# Patient Record
Sex: Female | Born: 1960 | Race: White | Hispanic: No | Smoking: Never smoker
Health system: Southern US, Community
[De-identification: ages and names within clinical notes are randomized; demographics above are authoritative.]

## PROBLEM LIST (undated history)

## (undated) DIAGNOSIS — Z9889 Other specified postprocedural states: Secondary | ICD-10-CM

## (undated) DIAGNOSIS — R112 Nausea with vomiting, unspecified: Secondary | ICD-10-CM

## (undated) DIAGNOSIS — K219 Gastro-esophageal reflux disease without esophagitis: Secondary | ICD-10-CM

## (undated) DIAGNOSIS — E559 Vitamin D deficiency, unspecified: Secondary | ICD-10-CM

## (undated) DIAGNOSIS — M7060 Trochanteric bursitis, unspecified hip: Secondary | ICD-10-CM

## (undated) DIAGNOSIS — N6320 Unspecified lump in the left breast, unspecified quadrant: Secondary | ICD-10-CM

## (undated) DIAGNOSIS — F419 Anxiety disorder, unspecified: Secondary | ICD-10-CM

## (undated) DIAGNOSIS — K5790 Diverticulosis of intestine, part unspecified, without perforation or abscess without bleeding: Secondary | ICD-10-CM

## (undated) DIAGNOSIS — K760 Fatty (change of) liver, not elsewhere classified: Secondary | ICD-10-CM

## (undated) DIAGNOSIS — I10 Essential (primary) hypertension: Secondary | ICD-10-CM

## (undated) DIAGNOSIS — Z98811 Dental restoration status: Secondary | ICD-10-CM

## (undated) DIAGNOSIS — R002 Palpitations: Secondary | ICD-10-CM

## (undated) DIAGNOSIS — E669 Obesity, unspecified: Secondary | ICD-10-CM

## (undated) DIAGNOSIS — E785 Hyperlipidemia, unspecified: Secondary | ICD-10-CM

## (undated) DIAGNOSIS — T7840XA Allergy, unspecified, initial encounter: Secondary | ICD-10-CM

## (undated) DIAGNOSIS — R7303 Prediabetes: Secondary | ICD-10-CM

## (undated) HISTORY — DX: Gastro-esophageal reflux disease without esophagitis: K21.9

## (undated) HISTORY — DX: Anxiety disorder, unspecified: F41.9

## (undated) HISTORY — PX: TUBAL LIGATION: SHX77

## (undated) HISTORY — PX: BREAST SURGERY: SHX581

## (undated) HISTORY — DX: Allergy, unspecified, initial encounter: T78.40XA

## (undated) HISTORY — DX: Hyperlipidemia, unspecified: E78.5

## (undated) HISTORY — PX: JOINT REPLACEMENT: SHX530

## (undated) HISTORY — DX: Palpitations: R00.2

## (undated) HISTORY — DX: Diverticulosis of intestine, part unspecified, without perforation or abscess without bleeding: K57.90

## (undated) HISTORY — DX: Obesity, unspecified: E66.9

## (undated) HISTORY — DX: Vitamin D deficiency, unspecified: E55.9

## (undated) HISTORY — DX: Fatty (change of) liver, not elsewhere classified: K76.0

## (undated) HISTORY — PX: ABDOMINAL HYSTERECTOMY: SHX81

## (undated) HISTORY — DX: Trochanteric bursitis, unspecified hip: M70.60

## (undated) HISTORY — DX: Essential (primary) hypertension: I10

## (undated) SURGERY — Surgical Case
Anesthesia: *Unknown

---

## 2002-10-22 ENCOUNTER — Encounter: Payer: Self-pay | Admitting: Internal Medicine

## 2003-06-22 ENCOUNTER — Encounter: Payer: Self-pay | Admitting: Internal Medicine

## 2004-09-11 ENCOUNTER — Ambulatory Visit: Payer: Self-pay | Admitting: Internal Medicine

## 2004-11-20 ENCOUNTER — Ambulatory Visit: Payer: Self-pay | Admitting: Internal Medicine

## 2004-12-20 ENCOUNTER — Ambulatory Visit: Payer: Self-pay | Admitting: Internal Medicine

## 2005-03-02 ENCOUNTER — Ambulatory Visit: Payer: Self-pay | Admitting: Internal Medicine

## 2005-03-07 ENCOUNTER — Ambulatory Visit: Payer: Self-pay | Admitting: Internal Medicine

## 2005-10-09 ENCOUNTER — Ambulatory Visit: Payer: Self-pay | Admitting: Internal Medicine

## 2005-10-24 ENCOUNTER — Encounter: Payer: Self-pay | Admitting: Internal Medicine

## 2006-07-08 ENCOUNTER — Ambulatory Visit: Payer: Self-pay | Admitting: Internal Medicine

## 2006-07-12 ENCOUNTER — Ambulatory Visit: Payer: Self-pay | Admitting: Internal Medicine

## 2007-02-01 ENCOUNTER — Ambulatory Visit: Payer: Self-pay | Admitting: Internal Medicine

## 2007-12-04 ENCOUNTER — Ambulatory Visit: Payer: Self-pay | Admitting: Internal Medicine

## 2007-12-04 DIAGNOSIS — K219 Gastro-esophageal reflux disease without esophagitis: Secondary | ICD-10-CM | POA: Insufficient documentation

## 2007-12-04 DIAGNOSIS — N943 Premenstrual tension syndrome: Secondary | ICD-10-CM | POA: Insufficient documentation

## 2007-12-04 DIAGNOSIS — E785 Hyperlipidemia, unspecified: Secondary | ICD-10-CM | POA: Insufficient documentation

## 2007-12-04 DIAGNOSIS — R030 Elevated blood-pressure reading, without diagnosis of hypertension: Secondary | ICD-10-CM | POA: Insufficient documentation

## 2007-12-04 DIAGNOSIS — I10 Essential (primary) hypertension: Secondary | ICD-10-CM | POA: Insufficient documentation

## 2007-12-05 LAB — CONVERTED CEMR LAB
Albumin: 4.2 g/dL (ref 3.5–5.2)
BUN: 12 mg/dL (ref 6–23)
Basophils Absolute: 0 10*3/uL (ref 0.0–0.1)
Basophils Relative: 0 % (ref 0–1)
CO2: 27 meq/L (ref 19–32)
Chloride: 103 meq/L (ref 96–112)
Creatinine, Ser: 0.88 mg/dL (ref 0.40–1.20)
Eosinophils Absolute: 0.2 10*3/uL (ref 0.0–0.7)
Eosinophils Relative: 2 % (ref 0–5)
HCT: 43.5 % (ref 36.0–46.0)
MCV: 85.8 fL (ref 78.0–100.0)
Platelets: 330 10*3/uL (ref 150–400)
RDW: 14.3 % (ref 11.5–15.5)
TSH: 3.413 microintl units/mL (ref 0.350–5.50)
Total Bilirubin: 0.4 mg/dL (ref 0.3–1.2)

## 2008-01-14 ENCOUNTER — Encounter: Payer: Self-pay | Admitting: Internal Medicine

## 2009-05-05 ENCOUNTER — Ambulatory Visit: Payer: Self-pay | Admitting: Family Medicine

## 2009-05-10 LAB — CONVERTED CEMR LAB
Glucose, Bld: 95 mg/dL (ref 70–99)
HDL: 38 mg/dL — ABNORMAL LOW (ref >39)
Triglycerides: 136 mg/dL (ref <150)

## 2009-05-11 ENCOUNTER — Ambulatory Visit: Payer: Self-pay | Admitting: Family Medicine

## 2009-05-11 DIAGNOSIS — M76899 Other specified enthesopathies of unspecified lower limb, excluding foot: Secondary | ICD-10-CM | POA: Insufficient documentation

## 2009-05-13 ENCOUNTER — Ambulatory Visit: Payer: Self-pay | Admitting: Family Medicine

## 2009-05-13 ENCOUNTER — Encounter: Payer: Self-pay | Admitting: Internal Medicine

## 2009-05-13 LAB — HM MAMMOGRAPHY: HM Mammogram: NORMAL

## 2009-05-16 ENCOUNTER — Telehealth: Payer: Self-pay | Admitting: Internal Medicine

## 2009-05-26 ENCOUNTER — Encounter: Payer: Self-pay | Admitting: Internal Medicine

## 2009-06-15 ENCOUNTER — Telehealth: Payer: Self-pay | Admitting: Family Medicine

## 2009-06-22 ENCOUNTER — Ambulatory Visit: Payer: Self-pay | Admitting: Family Medicine

## 2009-08-11 ENCOUNTER — Ambulatory Visit: Payer: Self-pay | Admitting: Family Medicine

## 2009-08-13 LAB — CONVERTED CEMR LAB
Cholesterol: 212 mg/dL — ABNORMAL HIGH (ref 0–200)
Total CHOL/HDL Ratio: 4.9
Triglycerides: 151 mg/dL — ABNORMAL HIGH (ref <150)
VLDL: 30 mg/dL (ref 0–40)

## 2009-09-06 ENCOUNTER — Ambulatory Visit: Payer: Self-pay | Admitting: Family Medicine

## 2010-03-02 ENCOUNTER — Ambulatory Visit: Payer: Self-pay | Admitting: Family Medicine

## 2010-03-05 LAB — CONVERTED CEMR LAB
Alkaline Phosphatase: 89 units/L (ref 39–117)
BUN: 12 mg/dL (ref 6–23)
Creatinine, Ser: 0.84 mg/dL (ref 0.40–1.20)
Eosinophils Absolute: 0.1 10*3/uL (ref 0.0–0.7)
Eosinophils Relative: 2 % (ref 0–5)
Glucose, Bld: 103 mg/dL — ABNORMAL HIGH (ref 70–99)
HCT: 42.2 % (ref 36.0–46.0)
HDL: 40 mg/dL (ref >39)
LDL Cholesterol: 137 mg/dL — ABNORMAL HIGH (ref 0–99)
Lymphs Abs: 2.6 10*3/uL (ref 0.7–4.0)
MCV: 83.9 fL (ref 78.0–100.0)
Monocytes Absolute: 0.6 10*3/uL (ref 0.1–1.0)
Monocytes Relative: 7 % (ref 3–12)
Platelets: 293 10*3/uL (ref 150–400)
Sodium: 142 meq/L (ref 135–145)
Total Bilirubin: 0.5 mg/dL (ref 0.3–1.2)
Total CHOL/HDL Ratio: 5.1
Triglycerides: 136 mg/dL (ref <150)
VLDL: 27 mg/dL (ref 0–40)
WBC: 8.7 10*3/uL (ref 4.0–10.5)

## 2010-03-06 ENCOUNTER — Ambulatory Visit: Payer: Self-pay | Admitting: Family Medicine

## 2010-03-06 ENCOUNTER — Encounter (INDEPENDENT_AMBULATORY_CARE_PROVIDER_SITE_OTHER): Payer: Self-pay | Admitting: *Deleted

## 2010-03-06 DIAGNOSIS — N951 Menopausal and female climacteric states: Secondary | ICD-10-CM | POA: Insufficient documentation

## 2010-03-06 DIAGNOSIS — L293 Anogenital pruritus, unspecified: Secondary | ICD-10-CM | POA: Insufficient documentation

## 2010-03-07 ENCOUNTER — Encounter: Payer: Self-pay | Admitting: Family Medicine

## 2010-03-07 LAB — CONVERTED CEMR LAB: LH: 15.86 milliintl units/mL

## 2010-03-22 ENCOUNTER — Encounter (INDEPENDENT_AMBULATORY_CARE_PROVIDER_SITE_OTHER): Payer: Self-pay | Admitting: *Deleted

## 2010-03-27 ENCOUNTER — Ambulatory Visit: Payer: Self-pay | Admitting: Internal Medicine

## 2010-04-06 HISTORY — PX: COLONOSCOPY: SHX174

## 2010-04-13 ENCOUNTER — Ambulatory Visit: Payer: Self-pay | Admitting: Internal Medicine

## 2010-04-13 LAB — HM COLONOSCOPY

## 2010-05-16 ENCOUNTER — Telehealth: Payer: Self-pay | Admitting: Family Medicine

## 2010-06-23 ENCOUNTER — Ambulatory Visit: Payer: Self-pay | Admitting: Family Medicine

## 2010-06-23 ENCOUNTER — Encounter: Payer: Self-pay | Admitting: Family Medicine

## 2010-06-27 ENCOUNTER — Encounter (INDEPENDENT_AMBULATORY_CARE_PROVIDER_SITE_OTHER): Payer: Self-pay | Admitting: *Deleted

## 2010-09-05 NOTE — Letter (Signed)
Summary: [facility] Instructions  [facility]  [address] [place], [place] [address]   Phone: [phone]  Fax: [phone]       ALLESANDRA HUEBSCH    11-01-1960    MRN: [id number]        Procedure Day Dorna Bloom:  Lenor Coffin  04/13/10     Arrival Time:  12:30PM     Procedure Time:  1:30PM     Location of Procedure:                    _ X_  [facility] (4th Floor)                       PREPARATION FOR COLONOSCOPY WITH MOVIPREP   Starting 5 days prior to your procedure 04/08/10 do not eat nuts, seeds, popcorn, corn, beans, peas,  salads, or any raw vegetables.  Do not take any fiber supplements (e.g. Metamucil, Citrucel, and Benefiber).  THE DAY BEFORE YOUR PROCEDURE         DATE: 04/12/10  DAY: WEDNESDAY  1.  Drink clear liquids the entire day-NO SOLID FOOD  2.  Do not drink anything colored red or purple.  Avoid juices with pulp.  No orange juice.  3.  Drink at least 64 oz. (8 glasses) of fluid/clear liquids during the day to prevent dehydration and help the prep work efficiently.  CLEAR LIQUIDS INCLUDE: Water Jello Ice Popsicles Tea (sugar ok, no milk/cream) Powdered fruit flavored drinks Coffee (sugar ok, no milk/cream) Gatorade Juice: apple, white grape, white cranberry  Lemonade Clear bullion, consomm, broth Carbonated beverages (any kind) Strained chicken noodle soup Hard Candy                             4.  In the morning, mix first dose of MoviPrep solution:    Empty 1 Pouch A and 1 Pouch B into the disposable container    Add lukewarm drinking water to the top line of the container. Mix to dissolve    Refrigerate (mixed solution should be used within 24 hrs)  5.  Begin drinking the prep at 5:00 p.m. The MoviPrep container is divided by 4 marks.   Every 15 minutes drink the solution down to the next mark (approximately 8 oz) until the full liter is complete.   6.  Follow completed prep with 16 oz of clear liquid of your choice (Nothing  red or purple).  Continue to drink clear liquids until bedtime.  7.  Before going to bed, mix second dose of MoviPrep solution:    Empty 1 Pouch A and 1 Pouch B into the disposable container    Add lukewarm drinking water to the top line of the container. Mix to dissolve    Refrigerate  THE DAY OF YOUR PROCEDURE      DATE: 04/13/10  DAY: THURSDAY  Beginning at 8:30AM (5 hours before procedure):         1. Every 15 minutes, drink the solution down to the next mark (approx 8 oz) until the full liter is complete.  2. Follow completed prep with 16 oz. of clear liquid of your choice.    3. You may drink clear liquids until 11:30AM (2 HOURS BEFORE PROCEDURE).   MEDICATION INSTRUCTIONS  Unless otherwise instructed, you should take regular prescription medications with a small sip of water   as early as possible the morning of  your procedure.        OTHER INSTRUCTIONS  You will need a responsible adult at least 51 years of age to accompany you and drive you home.   This person must remain in the waiting room during your procedure.  Wear loose fitting clothing that is easily removed.  Leave jewelry and other valuables at home.  However, you may wish to bring a book to read or  an iPod/MP3 player to listen to music as you wait for your procedure to start.  Remove all body piercing jewelry and leave at home.  Total time from sign-in until discharge is approximately 2-3 hours.  You should go home directly after your procedure and rest.  You can resume normal activities the  day after your procedure.  The day of your procedure you should not:   Drive   Make legal decisions   Operate machinery   Drink alcohol   Return to work  You will receive specific instructions about eating, activities and medications before you leave.    The above instructions have been reviewed and explained to me by   Wyona Almas RN  March 27, 2010 1:23 PM     I fully understand and can  verbalize these instructions _____________________________ Date _________

## 2010-09-05 NOTE — Miscellaneous (Signed)
Summary: lec pREVISIT/PREP  Clinical Lists Changes  Medications: Added new medication of MOVIPREP 100 GM  SOLR (PEG-KCL-NACL-NASULF-NA ASC-C) As per prep instructions. - Signed Rx of MOVIPREP 100 GM  SOLR (PEG-KCL-NACL-NASULF-NA ASC-C) As per prep instructions.;  #1 x 0;  Signed;  Entered by: Wyona Almas RN;  Authorized by: Hart Carwin MD;  Method used: Electronically to [facility] [address], [place], [place], [place]  [address], Ph: [phone], Fax: [phone] Observations: Added new observation of ALLERGY REV: Done (03/27/2010 12:43)    Prescriptions: MOVIPREP 100 GM  SOLR (PEG-KCL-NACL-NASULF-NA ASC-C) As per prep instructions.  #1 x 0   Entered by:   Wyona Almas RN   Authorized by:   Hart Carwin MD   Signed by:   Wyona Almas RN on 03/27/2010   Method used:   Electronically to        [facility] [address] (retail)       [address], [address]       [place], [place]  [address]       Ph: [phone]       Fax: [phone]   RxID:   [id number]

## 2010-09-05 NOTE — Procedures (Signed)
Summary: Colonoscopy  Patient: [name] Note: All result statuses are Final unless otherwise noted.  Tests: (1) Colonoscopy (COL)   COL Colonoscopy           DONE     [facility]     [address]     [place], [place]  [address]           COLONOSCOPY PROCEDURE REPORT           PATIENT:  Makayla [name], Makayla [name]  MR#:  [id number]     BIRTHDATE:  1960/11/24, 49 yrs. old  GENDER:  female     ENDOSCOPIST:  Hedwig Morton. Juanda Chance, MD     REF. BY:  Marne A. Milinda Antis, M.D.     PROCEDURE DATE:  04/13/2010     PROCEDURE:  Colonoscopy 24401     ASA CLASS:  Class I     INDICATIONS:  Routine Risk Screening     MEDICATIONS:   Versed 7 mg, Fentanyl 75 mcg           DESCRIPTION OF PROCEDURE:   After the risks benefits and     alternatives of the procedure were thoroughly explained, informed     consent was obtained.  Digital rectal exam was performed and     revealed no rectal masses.   The [id number] endoscope was     introduced through the anus and advanced to the cecum, which was     identified by both the appendix and ileocecal valve, without     limitations.  The quality of the prep was good, using MiraLax.     The instrument was then slowly withdrawn as the colon was fully     examined.     <<PROCEDUREIMAGES>>     FINDINGS:  Moderate diverticulosis was found throughout the colon     (see image1, image2, and image5).  This was otherwise a normal     examination of the colon (see image6, image4, and image3).     Retroflexed views in the rectum revealed no abnormalities.    Th     e scope was then withdrawn from the patient and the procedure     completed.           COMPLICATIONS:  None     ENDOSCOPIC IMPRESSION:     1) Moderate diverticulosis throughout the colon     2) Otherwise normal examination     RECOMMENDATIONS:     1) high fiber diet     REPEAT EXAM:  In 10 year(s) for.           ______________________________     Hedwig Morton. Juanda Chance, MD           CC:           n.     eSIGNED:    Hedwig Morton. Brodie at 04/13/2010 02:02 PM           Tawny Hopping, [id number]  Note: An exclamation mark (!) indicates a result that was not dispersed into the flowsheet. Document Creation Date: 04/13/2010 2:03 PM _______________________________________________________________________  (1) Order result status: Final Collection or observation date-time: 04/13/2010 13:55 Requested date-time:  Receipt date-time:  Reported date-time:  Referring Physician:   Ordering Physician: Lina Sar [phone]) Specimen Source:  Source: Launa Grill Order Number: [id number] Lab site:   Appended Document: Colonoscopy    Clinical Lists Changes  Observations: Added new observation of COLONNXTDUE: 04/2020 (04/13/2010 14:27)

## 2010-09-05 NOTE — Letter (Signed)
Summary: Results Follow up Letter  [facility]  [address] [place], [place] [address]   Phone: [phone]  Fax: [phone]    06/27/2010 MRN: [id number]     [facility] [address] [place], [place]  [address]     Dear Ms. DOSCH,  The following are the results of your recent test(s):  Test         Result    Pap Smear:        Normal _____  Not Normal _____ Comments: ______________________________________________________ Cholesterol: LDL(Bad cholesterol):         Your goal is less than:         HDL (Good cholesterol):       Your goal is more than: Comments:  ______________________________________________________ Mammogram:        Normal __x___  Not Normal _____ Comments:Repeat in 1 year  ___________________________________________________________________ Hemoccult:        Normal _____  Not normal _______ Comments:    _____________________________________________________________________ Other Tests:    We routinely do not discuss normal results over the telephone.  If you desire a copy of the results, or you have any questions about this information we can discuss them at your next office visit.   Sincerely,   Roxy Manns MD

## 2010-09-05 NOTE — Miscellaneous (Signed)
Summary: med list updated  Medications Added ESTROVEN  TABS (NUTRITIONAL SUPPLEMENTS) take daily as directed.       Clinical Lists Changes  Medications: Removed medication of PREMARIN 0.3 MG TABS (ESTROGENS CONJUGATED) take 1 by mouth once daily for the week of your period (days 1-7) Added new medication of ESTROVEN  TABS (NUTRITIONAL SUPPLEMENTS) take daily as directed.     Current Allergies: ! PROZAC ! * VAGINAL SUPPOSITORIES OR CREAM

## 2010-09-05 NOTE — Assessment & Plan Note (Signed)
Summary: CPX / LFW   Vital Signs:  Patient profile:   50 year old female Height:      64.5 inches Weight:      211.50 pounds BMI:     35.87 Temp:     98.3 degrees F oral Pulse rate:   72 / minute Pulse rhythm:   regular BP sitting:   138 / 96  (left arm) Cuff size:   large  Vitals Entered By: Lewanda Rife LPN (March 06, 2010 9:41 AM) CC: CPX LMP Hyst 1995   History of Present Illness: here for wellness exam   wt is down 8 lb eating less in the summer time    bp is high today 138/96--has had white coat htn in past  checks her bp at 140s/80s  mother had HTN   LDL chol is in the 130s    lipids are fair - has worked on it a little  reviewed labs today could not swallow the fish oil caps she bought initially , then bought a smaller one  will be trying that soon   hx of vaginitis- BV -- thinks it may be back  took flagyl occ itching  worse if she drinks soft drinks - worse  no d/c/ no odor / pain  had a consult with Dr Luella Cook for cystocele -- not ready for surgery yet  hyst in past for fibroids ( was partial)  on low dose premarin cyclicly for PMDD symptoms -- worked well for april and may-- taking it one week per month (2nd wed every mo) did not help then in june - was much more moody  does get hot at night -- not really sweats   is trying estroven otc -- seems to feel a little bit better   mam 2010 oct - was normal   Tdap in 08  turns 49 this month occ constipated colon screen -is interested in that   Allergies: 1)  ! Prozac 2)  ! 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 Eyes:  Denies blurring and eye irritation. CV:  Denies chest pain or discomfort, lightheadness, and palpitations. Resp:  Denies cough and wheezing. GI:  Complains of constipation; denies abdominal pain, indigestion, and nausea. MS:  Denies joint pain, joint swelling, muscle aches, and cramps. Derm:  Denies lesion(s), poor wound healing, and rash. Neuro:  Denies numbness and tingling. Psych:  Denies anxiety and depression. Endo:  Complains of heat intolerance; denies cold intolerance, excessive thirst, and excessive urination. Heme:  Denies abnormal bruising and bleeding.  Physical Exam  General:  overweight but generally well appearing  Head:  normocephalic and no abnormalities observed.   Eyes:  vision grossly intact, pupils equal, pupils round, and pupils reactive to light.  no conjunctival pallor, injection or icterus  Ears:  R ear normal and L ear normal.   Nose:  no nasal discharge.   Mouth:  pharynx pink and moist.   Neck:  supple with full rom and no masses or thyromegally, no JVD or carotid bruit  Chest Wall:  No deformities, masses, or tenderness noted. Breasts:  No mass, nodules, thickening, tenderness, bulging, retraction, inflamation, nipple discharge or skin changes noted.   Lungs:  Normal respiratory effort, chest expands symmetrically. Lungs are clear to auscultation, no crackles or wheezes. Heart:  Normal rate and regular  rhythm. S1 and S2 normal without gallop, murmur, click, rub or other extra sounds. Abdomen:  Bowel sounds positive,abdomen soft and non-tender without masses, organomegaly or hernias noted. no renal bruits  Genitalia:  normal introitus, no external lesions, no vaginal discharge, mucosa pink and moist, and no friaility or hemorrhage.  no M on bimanual  cystocele is moderate Msk:  No deformity or scoliosis noted of thoracic or lumbar spine.  no acute joint changes Pulses:  R and L carotid,radial,femoral,dorsalis pedis and posterior tibial pulses are full and equal bilaterally Extremities:  No clubbing, cyanosis, edema, or deformity noted with normal full range of motion of all joints.   Neurologic:  sensation intact to light touch, gait normal, and DTRs symmetrical and normal.   Skin:  Intact without suspicious lesions or rashes Cervical Nodes:  No lymphadenopathy noted Axillary Nodes:  No palpable lymphadenopathy Inguinal Nodes:  No significant adenopathy Psych:  normal affect, talkative and pleasant    Impression & Recommendations:  Problem # 1:  HEALTH MAINTENANCE EXAM (ICD-V70.0) Assessment Comment Only reviewed health habits including diet, exercise and skin cancer prevention reviewed health maintenance list and family history   Problem # 2:  ROUTINE GYNECOLOGICAL EXAMINATION (ICD-V72.31) Assessment: Comment Only without pap  wet prep is nl today- some vaginal itching -- ? 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 Otc as directed.  Problem # 5:  HYPERLIPIDEMIA (ICD-272.4) Assessment: Unchanged  rev labs today with LDL in 130s  rev low sat fat diet to improve this further  Labs Reviewed: SGOT: 15 (03/02/2010)   SGPT: 18 (03/02/2010)   HDL:40 (03/02/2010), 43 (08/11/2009)  LDL:137 (03/02/2010), 139 (08/11/2009)  Chol:204 (03/02/2010), 212 (08/11/2009)  Trig:136 (03/02/2010), 151 (08/11/2009)  Complete Medication List: 1)  Nexium 40 Mg Cpdr (Esomeprazole magnesium) .... Take 1 tablet by mouth once daily 2)  Premarin 0.3 Mg Tabs (Estrogens conjugated) .... Take 1 by mouth once daily for the week of your period (days 1-7) 3)  Tylenol Extra Strength 500 Mg Tabs (Acetaminophen) .... Otc as directed.  Other Orders: Venipuncture (  [address]) TLB-FSH (Follicle Stimulating Hormone) (83001-FSH) TLB-Luteinizing Hormone (LH) (83[address]-LH) Gastroenterology Referral (GI) Wet Prep [phone])  Patient Instructions: 1)  labs for hormones today 2)  you can raise your HDL (good cholesterol) by increasing exercise and eating omega 3 fatty acid supplement like fish oil or flax seed oil over the counter 3)  you can lower LDL (bad cholesterol) by limiting saturated fats in diet like red meat, fried foods, egg yolks, fatty breakfast meats, high fat dairy products and shellfish  4)  we will ref for colonosc at check out  Current Allergies (reviewed today): ! PROZAC ! * VAGINAL SUPPOSITORIES OR CREAM   Preventive Care Screening  Mammogram:    Date:  05/13/2009    Results:  normal     Laboratory Results    Wet Mount/KOH Source: vaginal WBC/hpf 1-5 Bacteria/hpf rare  Rods Clue cells/hpf none  Negative whiff Yeast/hpf none KOH Negative Trichomonas/hpf none

## 2010-09-05 NOTE — Assessment & Plan Note (Signed)
Summary: 3 month follow up /lsf   Vital Signs:  Patient profile:   50 year old female Height:      64.5 inches Weight:      219 pounds Temp:     97.9 degrees F oral Pulse rate:   76 / minute Pulse rhythm:   regular BP sitting:   138 / 82  (right arm) Cuff size:   large  Vitals Entered By: Liane Comber CMA (AAMA) (September 06, 2009 8:11 AM) CC: 3 mo f/u   History of Present Illness: here for f/u of high chol  at last visit - had LDL 156-- disc diet options/ watching sat fats   this check significantly improved with trig 151, HDL 43 and LDL 139 she did change her diet  eats a lot more chicken/ cut out red meat  lot of water and fruits and veg  not a lot of exercise   had injection in her hip -- is improved / wants to walk more    given prozac to use for PMDD she did not like the feeling with prozac -- felt like she could not function although did help her sleep better - and did help her mood  is not having periods - had hyst in 1995    wt is up 5 lb     Allergies: 1)  ! Prozac  Comments:  Nurse/Medical Assistant: The patient's medications were reviewed with the patient and were updated in the Medication List.  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 Eyes:  Denies blurring and eye irritation. CV:  Denies chest pain or discomfort, lightheadness, and palpitations. Resp:  Denies cough and wheezing. GI:  Denies indigestion. GU:  Denies abnormal vaginal bleeding, discharge, and dysuria. MS:  Complains of joint pain. Derm:  Denies poor wound healing and rash. Neuro:  Denies headaches, numbness, and tremors. Psych:  Complains of irritability. Endo:  Denies excessive thirst and excessive urination.  Physical Exam  General:  overweight but generally well appearing  Head:  normocephalic, atraumatic, and no abnormalities observed.   Neck:  supple with full rom and no masses or thyromegally, no JVD or carotid bruit  Lungs:  Normal respiratory effort, chest expands symmetrically. Lungs are clear to auscultation, no crackles or wheezes. Heart:  Normal rate and regular rhythm. S1 and S2 normal without gallop, murmur, click, rub or other extra sounds. Skin:  Intact without suspicious lesions or rashes Cervical Nodes:  No lymphadenopathy noted Psych:  normal affect, talkative and pleasant    Impression & Recommendations:  Problem # 1:  PREMENSTRUAL DYSPHORIC SYNDROME (ICD-625.4) Assessment Unchanged intol of prozac  will try small dose of premarin .3 -- during week of hormonal flux (which would be period)  update with response  f/u PE in summer Orders: Prescription Created Electronically [phone])  Problem # 2:  HYPERLIPIDEMIA (  ICD-272.4) Assessment: Improved  improved significantly with good diet -- rev that in detail will keep working to get to goal  plan to check labs before PE in summer  Labs Reviewed: SGOT: 15 (08/11/2009)   SGPT: 19 (08/11/2009)   HDL:43 (08/11/2009), 38 (05/05/2009)  LDL:139 (08/11/2009), 156 (05/05/2009)  Chol:212 (08/11/2009), 221 (05/05/2009)  Trig:151 (08/11/2009), 136 (05/05/2009)  Complete Medication List: 1)  Nexium 40 Mg Cpdr (Esomeprazole magnesium) .... Take 1-2 tabs by mouth  once daily 2)  Premarin 0.3 Mg Tabs (Estrogens conjugated) .... Take 1 by mouth once daily for the week of your period (days 1-7)  Patient Instructions: 1)  try premarin for 7 days of your symptoms (cycle days 1-7) 2)  let me know if this does not help  3)  keep up the good work with diet 4)  start working on some exercise  5)  schedule PE in late summer - labs 1 week before wellness/ lipid v70.0 Prescriptions: PREMARIN 0.3 MG TABS (ESTROGENS CONJUGATED) take 1 by mouth once daily for the week of your period (days 1-7)  #7 x 11   Entered and Authorized by:   Judith Part MD   Signed by:   Judith Part MD on 09/06/2009   Method used:   Electronically to        [facility] (retail)       [address], [address]       [place], [place]  [address]       Ph: [phone]       Fax: [phone]   RxID:   [id number]   Prior Medications (reviewed today): NEXIUM 40 MG CPDR (ESOMEPRAZOLE MAGNESIUM) take 1-2 tabs by mouth once daily Current Allergies (reviewed today): ! PROZAC Current Medications (including changes made in today's visit):  NEXIUM 40 MG CPDR (ESOMEPRAZOLE MAGNESIUM) take 1-2 tabs by mouth once daily PREMARIN 0.3 MG TABS (ESTROGENS CONJUGATED) take 1 by mouth once daily for the week of your period (days 1-7)

## 2010-09-05 NOTE — Letter (Signed)
Summary: Previsit letter  [facility]  [address] [place], [place] [address]   Phone: [phone]  Fax: [phone]       03/06/2010 MRN: [id number]  [facility] [address] [place], [place]  [address]  Dear Ms. HABERL,  Welcome to the Gastroenterology Division at [facility].    You are scheduled to see a nurse for your pre-procedure visit on 03-27-10 at 1:00p.m. on the 3rd floor at [facility], [address]  We ask that you try to arrive at our office 15 minutes prior to your appointment time to allow for check-in.  Your nurse visit will consist of discussing your medical and surgical history, your immediate family medical history, and your medications.    Please bring a complete list of all your medications or, if you prefer, bring the medication bottles and we will list them.  We will need to be aware of both prescribed and over the counter drugs.  We will need to know exact dosage information as well.  If you are on blood thinners (Coumadin, Plavix, Aggrenox, Ticlid, etc.) please call our office today/prior to your appointment, as we need to consult with your physician about holding your medication.   Please be prepared to read and sign documents such as consent forms, a financial agreement, and acknowledgement forms.  If necessary, and with your consent, a friend or relative is welcome to sit-in on the nurse visit with you.  Please bring your insurance card so that we may make a copy of it.  If your insurance requires a referral to see a specialist, please bring your referral form from your primary care physician.  No co-pay is required for this nurse visit.     If you cannot keep your appointment, please call [phone] to cancel or reschedule prior to your appointment date.  This allows [place] the opportunity to schedule an appointment for another patient in need of care.    Thank you for choosing [facility] for your medical needs.  We  appreciate the opportunity to care for you.  Please visit [place] at our website  to learn more about our practice.                     Sincerely.                                                                                                                   The Gastroenterology Division

## 2010-09-05 NOTE — Progress Notes (Signed)
Summary: pt needs order for mammogram  Phone Note Call from Patient   Caller: Patient Call For: Judith Part MD Summary of Call: Pt needs order for mammogram, she goes to [place]. Initial call taken by: Lowella Petties CMA,  May 16, 2010 12:12 PM  Follow-up for Phone Call        will do ref for [facility] Follow-up by: Judith Part MD,  May 16, 2010 1:46 PM

## 2010-11-09 ENCOUNTER — Encounter: Payer: Self-pay | Admitting: Family Medicine

## 2010-11-13 ENCOUNTER — Ambulatory Visit (INDEPENDENT_AMBULATORY_CARE_PROVIDER_SITE_OTHER): Payer: PRIVATE HEALTH INSURANCE | Admitting: Family Medicine

## 2010-11-13 ENCOUNTER — Encounter: Payer: Self-pay | Admitting: Family Medicine

## 2010-11-13 VITALS — BP 120/84 | HR 90 | Temp 97.9°F | Ht 65.0 in | Wt 211.8 lb

## 2010-11-13 DIAGNOSIS — M722 Plantar fascial fibromatosis: Secondary | ICD-10-CM

## 2010-11-13 NOTE — Progress Notes (Signed)
50 year old female:  The patient presents with a year long history of heel pain that has been coming and going. This is notable for worsening pain first thing in the morning when arising and standing after sitting.   Prior foot or ankle fractures: none Prior operations: none Orthotics or bracing: none Medications: NSAIDS PT or home rehab: occ stretch, tennis ball massage Night splints: no Ball massage: y  Metatarsal pain: no  REVIEW OF SYSTEMS  GEN: No fevers, chills. Nontoxic. Primarily MSK c/o today. MSK: Detailed in the HPI GI: tolerating PO intake without difficulty Neuro: No numbness, parasthesias, or tingling associated. Otherwise the pertinent positives of the ROS are noted above.   The PMH, PSH, Social History, Family History, Medications, and allergies have been reviewed in [facility], and have been updated if relevant.  GEN: Well-developed,well-nourished,in no acute distress; alert,appropriate and cooperative throughout examination HEENT: Normocephalic and atraumatic without obvious abnormalities. Ears, externally no deformities PULM: Breathing comfortably in no respiratory distress EXT: No clubbing, cyanosis, or edema PSYCH: Normally interactive. Cooperative during the interview. Pleasant. Friendly and conversant. Not anxious or depressed appearing. Normal, full affect.  Echymosis: no Edema: no ROM: full LE B Gait: heel toe, non-antalgic MT pain: no Callus pattern: none Lateral Mall: NT Medial Mall: NT Talus: NT Navicular: NT Calcaneous: NT Metatarsals: NT 5th MT: NT Phalanges: NT Achilles: NT Plantar Fascia: tender, medial along PF. Pain with forced dorsi - on the R Fat Pad: NT Peroneals: NT Post Tib: NT Great Toe: Nml motion Ant Drawer: neg Other foot breakdown: none Long arch: mild breakdown Transverse arch: preserved Hindfoot breakdown: none Sensation: intact  A/P: Plantar fascitis: >25 minutes spent in face to face time with patient, >50% spent in  counselling or coordination of care  We reviewed that stretching is critically important to the treatment of PF. Using an anatomical model, I reviewed with the patent the structures involved and how they related to their diagnosis .  Reviewed footwear. Rigid soles have been shown to help with PF. Reviewed rehab of stretching and calf raises.   Change shoes, rehab, arch binders F/u if not improving in a couple of months

## 2010-11-13 NOTE — Patient Instructions (Signed)
Excellent Over the Counter Orthotics: Hapad: available at www.hapad.com SPENCO: Available at some sports stores or www.amazon.com  SHOES: Danskos, Merrells, Keens, [place] - good arch support, want minimal bendability [employer] in [place]: excellent staff, shoe selection, OTC orthotics Shoes: Birkenstock shoes, [employer] THE [place], [address] [place], [place]

## 2010-12-07 ENCOUNTER — Encounter (INDEPENDENT_AMBULATORY_CARE_PROVIDER_SITE_OTHER): Payer: PRIVATE HEALTH INSURANCE | Admitting: Family Medicine

## 2010-12-07 DIAGNOSIS — N951 Menopausal and female climacteric states: Secondary | ICD-10-CM

## 2010-12-08 NOTE — Assessment & Plan Note (Signed)
NAME:  Makayla [name], Makayla [name] NO.:  [id number]  MEDICAL RECORD NO.:  [id number]           PATIENT TYPE:  LOCATION:  [facility] at [facility]           FACILITY:  PHYSICIAN:  Tinnie Gens, MD             DATE OF BIRTH:  DATE OF SERVICE:  12/07/2010                                 CLINIC NOTE  CHIEF COMPLAINT:  Menopause.  HISTORY OF PRESENT ILLNESS:  The patient is a 50 year old gravida 1, para 1 who underwent BTL, followed by hysterectomy for fibroid uterus. She has had a several year history of just not feeling like herself. She reports being upset frequently.  She is not sleeping well.  She is having trouble with her memory.  She wondered if she was depressed, however, her primary care doctor, Dr. Roxy Manns, put her on Prozac which made her feel just like a zombie and not really there, did not really care about much, and so she stopped that after approximately 1 week.  She feels emotionally disconnected and irritable.  She has hot flashes, especially at night.  She does not feel especially sad.  Dr. Milinda Antis did an [facility] and told her she is menopausal and put her on 2-3 months of estrogen oral dosing for 7 days and then took her off of that and told her to take Coleman daily.  The patient says that has helped a little, stopped her hot flashes for a while.  The patient additionally complains today of having a cystocele that she notes needs a repair, she loses urine and requires panty liners that she has to change 2-3 times a day.  She reports symptoms of GSI as well as urge incontinence.  She would like a referral for treatment of this.  PAST MEDICAL HISTORY:  Significant for: 1. Gastroesophageal reflux disease. 2. Elevated cholesterol.  PAST SURGICAL HISTORY:  She has had a BTL and a hysterectomy.  MEDICATIONS:  She is on Nexium 40 mg daily, Estroven daily.  ALLERGIES:  None known.  OBSTETRICAL HISTORY:  She is G1 P1 with one vaginal delivery 25  years ago.  GYNECOLOGIC HISTORY:  The patient underwent a TAH for fibroids.  Her ovaries were left in place.  She no longer has a cervix.  FAMILY HISTORY:  Her mother has type 2 diabetes and hypertension and paternal grandmother and grandfather had heart disease and heart attack. She had a first cousin who had breast cancer and her mother had uterine cancer and prior hysterectomy at age 24.  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  Her spouse is semi-retired.  They live together.  Her mom is staying temporarily with them, but she is actually very helpful, and this is temporary situation until she can get an apartment.  She denies tobacco, alcohol, or drug use.  She drinks approximately three caffeinated beverages daily.  A 14-point review of systems reviewed.  Please see GYN history in the chart.  She reports night sweats and fatigue, weight gain, problems with urination, and hot flashes.  Please see HPI for further details of these events.  PHYSICAL EXAMINATION:  VITAL SIGNS:  The patient's blood pressure is 134/89, weight 211, pulse is 80. GENERAL:  She is a well-developed, well-nourished female, in no acute distress. ABDOMEN:  Soft and nontender.  IMPRESSION: 1. Menopausal symptoms, primarily emotional lability and detachment.     Difficulty sleeping prior related to that as well. 2. Urinary incontinence, probable mixed variety, with cystocele.  PLAN:  A lengthy discussion was had with the patient regarding risks, benefits of estrogen replacement therapy.  She has previously been to a lecture that I gave on the [facility] study, findings of that, risks of estrogen, and would be a good candidate for estrogen replacement.  At this time, she will stop her Estroven and begin Vivelle- Dot 0.025.  We will see her back in 4 weeks and see if this is helping. Alternative treatments may include SNRI or other classes of  SSRIs which she may tolerate better than Prozac. Additionally, I believe the patient will be referred to Dr. McDiarmid for full urodynamic testing and then discussion about surgical treatment versus medical treatment as needed.  She will return in 4 weeks.          ______________________________ Tinnie Gens, MD    TP/MEDQ  D:  12/07/2010  T:  12/07/2010  Job:  [id number]

## 2010-12-15 ENCOUNTER — Other Ambulatory Visit: Payer: Self-pay | Admitting: Internal Medicine

## 2011-01-09 ENCOUNTER — Ambulatory Visit (INDEPENDENT_AMBULATORY_CARE_PROVIDER_SITE_OTHER): Payer: PRIVATE HEALTH INSURANCE | Admitting: Family Medicine

## 2011-01-09 DIAGNOSIS — N951 Menopausal and female climacteric states: Secondary | ICD-10-CM

## 2011-01-10 NOTE — Assessment & Plan Note (Signed)
NAME:  Makayla [name], Makayla [name] NO.:  [id number]  MEDICAL RECORD NO.:  [id number]           PATIENT TYPE:  LOCATION:  [facility] at [facility]           FACILITY:  PHYSICIAN:  Tinnie Gens, MD             DATE OF BIRTH:  DATE OF SERVICE:  01/09/2011                                 CLINIC NOTE  CHIEF COMPLAINT:  Followup hormone therapy.  HISTORY OF PRESENT ILLNESS:  The patient is a 49-year gravida 1, para 1 who was previously seen here a month ago.  She was started on Vivelle- Dot 0.025 for hormonal symptoms related to menopause, emotional lability, detachment, and difficulty sleeping.  She reports feeling much better since starting her Vivelle-Dot.  She states she is sleeping so hard and so well that some trouble getting ease in the morning, but she really is without significant complaint and feels much better on Vivelle- Dot.  She stopped her Estroven.  Since her last visit, we also discussed referral for incontinence evaluation with Dr. McDiarmid, which was not scheduled, but will be scheduled for her today.  She will follow up in 6 months to ensure that we do not need to change on her Vivelle-Dot and otherwise come back as needed.          ______________________________ Tinnie Gens, MD    TP/MEDQ  D:  01/09/2011  T:  01/09/2011  Job:  [id number]

## 2011-04-09 ENCOUNTER — Other Ambulatory Visit: Payer: Self-pay | Admitting: Internal Medicine

## 2011-04-10 NOTE — Telephone Encounter (Signed)
Rx called to [facility]. 

## 2011-04-10 NOTE — Telephone Encounter (Signed)
Px written for call in  Sorry- I did not catch the pharmacy

## 2011-05-11 ENCOUNTER — Other Ambulatory Visit (HOSPITAL_COMMUNITY): Payer: Self-pay | Admitting: Urology

## 2011-05-11 DIAGNOSIS — N2889 Other specified disorders of kidney and ureter: Secondary | ICD-10-CM

## 2011-05-19 ENCOUNTER — Ambulatory Visit (HOSPITAL_COMMUNITY)
Admission: RE | Admit: 2011-05-19 | Discharge: 2011-05-19 | Disposition: A | Discharge status: Home / Self Care | Payer: PRIVATE HEALTH INSURANCE | Source: Ambulatory Visit | Attending: Urology | Admitting: Urology

## 2011-05-19 DIAGNOSIS — D3500 Benign neoplasm of unspecified adrenal gland: Secondary | ICD-10-CM | POA: Insufficient documentation

## 2011-05-19 DIAGNOSIS — K7689 Other specified diseases of liver: Secondary | ICD-10-CM | POA: Insufficient documentation

## 2011-05-19 DIAGNOSIS — K5730 Diverticulosis of large intestine without perforation or abscess without bleeding: Secondary | ICD-10-CM | POA: Insufficient documentation

## 2011-05-19 DIAGNOSIS — K8020 Calculus of gallbladder without cholecystitis without obstruction: Secondary | ICD-10-CM | POA: Insufficient documentation

## 2011-05-19 DIAGNOSIS — N281 Cyst of kidney, acquired: Secondary | ICD-10-CM | POA: Insufficient documentation

## 2011-05-19 DIAGNOSIS — N2889 Other specified disorders of kidney and ureter: Secondary | ICD-10-CM

## 2011-05-19 MED ORDER — GADOBENATE DIMEGLUMINE 529 MG/ML IV SOLN
14.0000 mL | Freq: Once | INTRAVENOUS | Status: AC | PRN
  Administered 2011-05-19: 14 mL via INTRAVENOUS

## 2011-05-19 MED ORDER — GADOBENATE DIMEGLUMINE 529 MG/ML IV SOLN
20.0000 mL | Freq: Once | INTRAVENOUS | Status: AC | PRN
  Administered 2011-05-19: 20 mL via INTRAVENOUS

## 2011-06-11 ENCOUNTER — Encounter (HOSPITAL_COMMUNITY): Payer: PRIVATE HEALTH INSURANCE

## 2011-06-11 ENCOUNTER — Encounter (HOSPITAL_COMMUNITY): Payer: Self-pay

## 2011-06-11 ENCOUNTER — Other Ambulatory Visit: Payer: Self-pay

## 2011-06-11 ENCOUNTER — Ambulatory Visit (HOSPITAL_COMMUNITY)
Admission: RE | Admit: 2011-06-11 | Discharge: 2011-06-11 | Disposition: A | Discharge status: Home / Self Care | Payer: PRIVATE HEALTH INSURANCE | Source: Ambulatory Visit | Attending: Urology | Admitting: Urology

## 2011-06-11 DIAGNOSIS — N8110 Cystocele, unspecified: Secondary | ICD-10-CM | POA: Insufficient documentation

## 2011-06-11 DIAGNOSIS — Z01812 Encounter for preprocedural laboratory examination: Secondary | ICD-10-CM | POA: Insufficient documentation

## 2011-06-11 DIAGNOSIS — R9431 Abnormal electrocardiogram [ECG] [EKG]: Secondary | ICD-10-CM | POA: Insufficient documentation

## 2011-06-11 DIAGNOSIS — R32 Unspecified urinary incontinence: Secondary | ICD-10-CM | POA: Insufficient documentation

## 2011-06-11 DIAGNOSIS — Z01810 Encounter for preprocedural cardiovascular examination: Secondary | ICD-10-CM | POA: Insufficient documentation

## 2011-06-11 LAB — CBC
HCT: 42.7 % (ref 36.0–46.0)
Hemoglobin: 14.1 g/dL (ref 12.0–15.0)
MCH: 27.8 pg (ref 26.0–34.0)
MCHC: 33 g/dL (ref 30.0–36.0)
RBC: 5.07 MIL/uL (ref 3.87–5.11)

## 2011-06-11 LAB — SURGICAL PCR SCREEN
MRSA, PCR: NEGATIVE
Staphylococcus aureus: POSITIVE — AB

## 2011-06-11 LAB — BASIC METABOLIC PANEL
BUN: 9 mg/dL (ref 6–23)
CO2: 28 mEq/L (ref 19–32)
Chloride: 101 mEq/L (ref 96–112)
Glucose, Bld: 99 mg/dL (ref 70–99)
Potassium: 3.9 mEq/L (ref 3.5–5.1)

## 2011-06-11 LAB — ABO/RH: ABO/RH(D): O POS

## 2011-06-11 NOTE — H&P (Signed)
Chief Complaint  Vaginal vault proplapse   History of Present Illness     Makayla [name] is a 50 year old seen at the request of Dr. Lorin Picket MacDiarmid for pelvic organ prolapse and specifically to consider a robotic assisted laparoscopic sacrocolpopexy. She has a history of mixed stress and urge incontinence and severe pelvic organ prolapse.  She has been thoroughly evaluated by Dr. Sherron Monday who feels an abdominal sacrocolpopexy would be best to treat her prolapse.  She does have stress incontinence and a rectocele and Dr. Sherron Monday believes she may also need a sling and rectocele repair. Her symptoms have included vaginal pressure/discomfort, inability to participate in sexual intercourse, mixed urinary incontinence, and constipation/straining with bowel movements. Her symptoms have been progressive over the past 2 years and she has decided to proceed with surgical intervention at this time.   She also was incidentally noted to have a small left renal mass on a recent CT scan performed by Dr. Sherron Monday. She was noted to have a complex left renal cyst on ultrasound in her CT scan confirmed a small 1.1 cm lesion which is completely intra-parenchymal and the lower pole of the left kidney. It demonstrates questionable enhancement although there is a question of "pseudo-enhancement" based on CT reconstruction artifact. She has been completely asymptomatic and denies any hematuria or flank pain. She has no family history of kidney cancer.   Past Medical History Problems  1. History of  Esophageal Reflux 530.81 2. History of  Hypercholesterolemia 272.0  Surgical History Problems  1. History of  Hysterectomy V45.77 2. History of  Tubal Ligation V25.2  Current Meds 1. NexIUM 40 MG Oral Capsule Delayed Release; Therapy: (Recorded:27Jun2012) to 2. Tylenol TABS; Therapy: (Recorded:27Jun2012) to 3. Vivelle-Dot 0.05 MG/24HR Transdermal Patch Biweekly; Therapy: (Recorded:27Jun2012) to  Allergies Medication   1. No Known Drug Allergies  Family History Problems  1. Sororal history of  Blood In Urine 2. Maternal history of  Diabetes Mellitus V18.0 3. Family history of  Family Health Status Number Of Children 1 daughter 4. Paternal history of  Hypertension V17.49 5. Maternal history of  Hypertension V17.49 6. 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    Physical Exam Constitutional: Well nourished and well developed . No acute distress.  ENT:. The ears and nose are normal in appearance.  Neck: The appearance of the neck is normal and no neck mass is present.  Pulmonary: No respiratory distress and normal respiratory rhythm and effort.  Cardiovascular: Heart rate and rhythm are normal . No peripheral edema.  Abdomen: The abdomen is mildly obese. The abdomen is soft and nontender. No masses are palpated. No CVA tenderness. No hernias are palpable. No hepatosplenomegaly noted.  Genitourinary:. Deferred due to her recent exam by Dr. Sherron Monday.  Skin: Normal skin turgor, no visible rash and no visible skin lesions.  Neuro/Psych:. Mood and affect are appropriate.    Results/Data Urine [Data Includes: Last 1 Day]  02Oct2012  COLOR: YELLOW  Reference Range YELLOW APPEARANCE: CLEAR  Reference Range CLEAR SPECIFIC GRAVITY: 1.015  Reference Range 1.005-1.030 pH: 6.0  Reference Range 5.0-8.0 GLUCOSE:  NEG mg/dL Reference Range NEG BILIRUBIN: NEG  Reference Range NEG KETONE: NEG mg/dL Reference Range NEG BLOOD: TRACE  Abnormal Reference Range  NEG PROTEIN: NEG mg/dL Reference Range NEG UROBILINOGEN: 1 mg/dL Reference Range 7.8-2.9 NITRITE: NEG  Reference Range NEG LEUKOCYTE ESTERASE: NEG  Reference Range NEG SQUAMOUS EPITHELIAL/HPF: MODERATE  Abnormal Reference Range RARE WBC: 0-3 WBC/hpf Reference Range <4 RBC: 0-3 RBC/hpf Reference Range <4 BACTERIA: MODERATE  Abnormal Reference Range RARE CRYSTALS: NONE SEEN  Reference Range NEG CASTS: NONE SEEN  Reference Range NEG Selected Results  CT-ABD/PELVIS W/W/O CONTRAST 10Sep2012 12:00AM Alfredo Martinez  Test Name Result Flag Reference ** RADIOLOGY REPORT BY [facility] ** ORIGINAL APPROVED BY: Consuello Bossier, M.D. ON: 04/16/2011 17:01:35   *RADIOLOGY REPORT*  Clinical Data: Complex left renal cyst. Possible left-sided hydronephrosis on ultrasound. No history of renal stones.  CT ABDOMEN AND PELVIS WITHOUT AND WITH CONTRAST  Technique: Multidetector CT imaging of the abdomen and pelvis was performed without contrast material in one or both body regions, followed by contrast material(s) and further sections in one or both body regions.  Contrast: 125 ml Isovue 300  Comparison: renal ultrasound of 02/28/2011  Findings: Unenhanced images demonstrate no renal calculi or hydronephrosis. No upper ureteric stone.  Post contrast images demonstrate clear lung bases. Mild cardiomegaly without pericardial or pleural effusion. Small hiatal hernia.  Moderate hepatic steatosis. Sparing adjacent the gallbladder. Mild hepatomegaly.  Normal spleen, distal stomach, pancreas, gallbladder, biliary tract, right adrenal gland. Minimal left adrenal nodularity. 9 mm on image 36 of series 3. Favored to represent a small adenoma, given low density on unenhanced study.  Interpolar left renal lesion is most apparent on delayed images and measures 31 HU and 1.1 cm. Image 168 of series 3. 17 HU on image 118 sagittal. 13.8 HU on image 101 coronal. Not well visualized prior  to contrast. Approximately 13 HU on image 47 of series 2.  Borderline left-sided caliectasis on the delayed images, without hydroureter. Good bilateral collecting system opacification. Portions of the mid to distal ureters are not entirely opacified, despite numerous attempts. No filling defects identified. Circumaortic left renal vein. No retroperitoneal or retrocrural adenopathy.  Scattered colonic diverticula. Normal colon, appendix, and terminal ileum. Normal small bowel without abdominal ascites.  Mild degradation within the pelvis secondary to patient's size.  No pelvic adenopathy. Normal urinary bladder. Hysterectomy. No adnexal mass or significant free pelvic fluid.  No acute osseous abnormality.  IMPRESSION:  1. A left renal lesion which measures slightly greater than fluid density on transverse delayed images. Less than 20 HU on reformatted images. Favored to represent a minimally complex cyst. Apparent increase between pre and postcontrast transverse plane images is favored to be secondary to "pseudo enhancement" a CT reconstruction artifact. Given the mildly equivocal findings of the current exam, ultrasound surveillance of this lesion is recommended to confirm ongoing size stability ( exclude highly unlikely solid lesion). If a more aggressive approach is desired, pre and post contrast abdominal MRI would likely be definitive. 2. No evidence of hydronephrosis on pre or portal venous phase postcontrast images. The left sided collecting system appears mildly full on delayed images. No hydroureter identified. Favored to be within normal variation. 3. Hepatic steatosis and mild hepatomegaly.   Assessment Assessed  1. 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 She gives her informed consent to proceed with a robotic-assisted laparoscopic sacrocolpopexy with Dr. Sherron Monday. She understands the Dr.  Sherron Monday will  assess her need for a possible urethral sling or rectocele repair intraoperatively and these procedures may need to be performed at the same setting.

## 2011-06-14 ENCOUNTER — Encounter (HOSPITAL_COMMUNITY): Payer: Self-pay | Admitting: Anesthesiology

## 2011-06-14 ENCOUNTER — Encounter (HOSPITAL_COMMUNITY): Payer: Self-pay | Admitting: *Deleted

## 2011-06-14 ENCOUNTER — Ambulatory Visit (HOSPITAL_COMMUNITY)
Admission: RE | Admit: 2011-06-14 | Discharge: 2011-06-15 | Disposition: A | Discharge status: Home / Self Care | Payer: PRIVATE HEALTH INSURANCE | Source: Ambulatory Visit | Attending: Urology | Admitting: Urology

## 2011-06-14 ENCOUNTER — Encounter (HOSPITAL_COMMUNITY)
Admission: RE | Disposition: A | Discharge status: Home / Self Care | Payer: Self-pay | Source: Ambulatory Visit | Attending: Urology

## 2011-06-14 ENCOUNTER — Ambulatory Visit (HOSPITAL_COMMUNITY): Payer: PRIVATE HEALTH INSURANCE | Admitting: Anesthesiology

## 2011-06-14 DIAGNOSIS — N993 Prolapse of vaginal vault after hysterectomy: Secondary | ICD-10-CM | POA: Insufficient documentation

## 2011-06-14 DIAGNOSIS — R160 Hepatomegaly, not elsewhere classified: Secondary | ICD-10-CM | POA: Insufficient documentation

## 2011-06-14 DIAGNOSIS — K219 Gastro-esophageal reflux disease without esophagitis: Secondary | ICD-10-CM | POA: Insufficient documentation

## 2011-06-14 DIAGNOSIS — E7800 Pure hypercholesterolemia, unspecified: Secondary | ICD-10-CM | POA: Insufficient documentation

## 2011-06-14 DIAGNOSIS — Z90710 Acquired absence of both cervix and uterus: Secondary | ICD-10-CM | POA: Insufficient documentation

## 2011-06-14 DIAGNOSIS — Z79899 Other long term (current) drug therapy: Secondary | ICD-10-CM | POA: Insufficient documentation

## 2011-06-14 DIAGNOSIS — I517 Cardiomegaly: Secondary | ICD-10-CM | POA: Insufficient documentation

## 2011-06-14 DIAGNOSIS — N393 Stress incontinence (female) (male): Secondary | ICD-10-CM | POA: Insufficient documentation

## 2011-06-14 DIAGNOSIS — K7689 Other specified diseases of liver: Secondary | ICD-10-CM | POA: Insufficient documentation

## 2011-06-14 DIAGNOSIS — K449 Diaphragmatic hernia without obstruction or gangrene: Secondary | ICD-10-CM | POA: Insufficient documentation

## 2011-06-14 DIAGNOSIS — E278 Other specified disorders of adrenal gland: Secondary | ICD-10-CM | POA: Insufficient documentation

## 2011-06-14 DIAGNOSIS — N289 Disorder of kidney and ureter, unspecified: Secondary | ICD-10-CM | POA: Insufficient documentation

## 2011-06-14 DIAGNOSIS — K5730 Diverticulosis of large intestine without perforation or abscess without bleeding: Secondary | ICD-10-CM | POA: Insufficient documentation

## 2011-06-14 DIAGNOSIS — Q619 Cystic kidney disease, unspecified: Secondary | ICD-10-CM | POA: Insufficient documentation

## 2011-06-14 HISTORY — PX: ROBOTIC ASSISTED LAPAROSCOPIC SACROCOLPOPEXY: SHX5388

## 2011-06-14 HISTORY — PX: RECTOCELE REPAIR: SHX761

## 2011-06-14 LAB — HEMOGLOBIN AND HEMATOCRIT, BLOOD
HCT: 39 % (ref 36.0–46.0)
Hemoglobin: 12.8 g/dL (ref 12.0–15.0)

## 2011-06-14 SURGERY — ROBOTIC ASSISTED LAPAROSCOPIC SACROCOLPOPEXY
Anesthesia: General | Site: Abdomen | Wound class: Clean

## 2011-06-14 MED ORDER — MIDAZOLAM HCL 5 MG/5ML IJ SOLN
INTRAMUSCULAR | Status: DC | PRN
  Administered 2011-06-14: 2 mg via INTRAVENOUS

## 2011-06-14 MED ORDER — DOCUSATE SODIUM 100 MG PO CAPS
100.0000 mg | ORAL_CAPSULE | Freq: Two times a day (BID) | ORAL | Status: DC
  Administered 2011-06-14 – 2011-06-15 (×2): 100 mg via ORAL
  Filled 2011-06-14 (×3): qty 1

## 2011-06-14 MED ORDER — ONDANSETRON HCL 4 MG/2ML IJ SOLN
4.0000 mg | INTRAMUSCULAR | Status: DC | PRN
  Administered 2011-06-14: 4 mg via INTRAVENOUS
  Filled 2011-06-14: qty 2

## 2011-06-14 MED ORDER — DIPHENHYDRAMINE HCL 12.5 MG/5ML PO ELIX
12.5000 mg | ORAL_SOLUTION | Freq: Four times a day (QID) | ORAL | Status: DC | PRN
Start: 2011-06-14 — End: 2011-06-15

## 2011-06-14 MED ORDER — VANCOMYCIN HCL IN DEXTROSE 1-5 GM/200ML-% IV SOLN
INTRAVENOUS | Status: AC
  Filled 2011-06-14: qty 200

## 2011-06-14 MED ORDER — PROPOFOL 10 MG/ML IV EMUL
INTRAVENOUS | Status: DC | PRN
  Administered 2011-06-14: 150 mg via INTRAVENOUS

## 2011-06-14 MED ORDER — HYDROMORPHONE HCL PF 1 MG/ML IJ SOLN
0.2500 mg | INTRAMUSCULAR | Status: DC | PRN
  Administered 2011-06-14 (×2): 0.25 mg via INTRAVENOUS

## 2011-06-14 MED ORDER — LIDOCAINE HCL (CARDIAC) 20 MG/ML IV SOLN
INTRAVENOUS | Status: DC | PRN
  Administered 2011-06-14: 100 mg via INTRAVENOUS

## 2011-06-14 MED ORDER — GENTAMICIN IN SALINE 1.6-0.9 MG/ML-% IV SOLN
INTRAVENOUS | Status: DC | PRN
  Administered 2011-06-14: 80 mg via INTRAVENOUS

## 2011-06-14 MED ORDER — HYDROMORPHONE HCL PF 1 MG/ML IJ SOLN
INTRAMUSCULAR | Status: DC | PRN
  Administered 2011-06-14: 1 mg via INTRAVENOUS

## 2011-06-14 MED ORDER — KETOROLAC TROMETHAMINE 30 MG/ML IJ SOLN
INTRAMUSCULAR | Status: AC
  Administered 2011-06-14: 30 mg via INTRAVENOUS
  Filled 2011-06-14: qty 1

## 2011-06-14 MED ORDER — HYDROMORPHONE HCL PF 1 MG/ML IJ SOLN
INTRAMUSCULAR | Status: AC
  Filled 2011-06-14: qty 1

## 2011-06-14 MED ORDER — GENTAMICIN IN SALINE 1.6-0.9 MG/ML-% IV SOLN
INTRAVENOUS | Status: AC
  Filled 2011-06-14: qty 50

## 2011-06-14 MED ORDER — GENTAMICIN IN SALINE 1.6-0.9 MG/ML-% IV SOLN
80.0000 mg | Freq: Once | INTRAVENOUS | Status: DC
Start: 2011-06-14 — End: 2011-06-14

## 2011-06-14 MED ORDER — ESTRADIOL 0.1 MG/GM VA CREA
TOPICAL_CREAM | VAGINAL | Status: AC
  Filled 2011-06-14: qty 85

## 2011-06-14 MED ORDER — VANCOMYCIN HCL 1000 MG IV SOLR
1000.0000 mg | INTRAVENOUS | Status: DC | PRN
  Administered 2011-06-14: 1 g via INTRAVENOUS

## 2011-06-14 MED ORDER — BUPIVACAINE-EPINEPHRINE 0.25% -1:200000 IJ SOLN
INTRAMUSCULAR | Status: DC | PRN
  Administered 2011-06-14: 30 mL

## 2011-06-14 MED ORDER — INDIGOTINDISULFONATE SODIUM 8 MG/ML IJ SOLN
INTRAMUSCULAR | Status: DC | PRN
  Administered 2011-06-14: 5 mg via INTRAVENOUS

## 2011-06-14 MED ORDER — LIDOCAINE-EPINEPHRINE (PF) 1 %-1:200000 IJ SOLN
INTRAMUSCULAR | Status: DC | PRN
  Administered 2011-06-14: 2 mL

## 2011-06-14 MED ORDER — LACTATED RINGERS IR SOLN
Status: DC | PRN
  Administered 2011-06-14: 1000 mL

## 2011-06-14 MED ORDER — ONDANSETRON HCL 4 MG/2ML IJ SOLN
INTRAMUSCULAR | Status: DC | PRN
  Administered 2011-06-14: 4 mg via INTRAVENOUS

## 2011-06-14 MED ORDER — KCL IN DEXTROSE-NACL 20-5-0.45 MEQ/L-%-% IV SOLN
INTRAVENOUS | Status: DC
  Administered 2011-06-14 – 2011-06-15 (×3): via INTRAVENOUS
  Filled 2011-06-14 (×5): qty 1000

## 2011-06-14 MED ORDER — LIDOCAINE-EPINEPHRINE (PF) 1 %-1:200000 IJ SOLN
INTRAMUSCULAR | Status: AC
  Filled 2011-06-14: qty 10

## 2011-06-14 MED ORDER — PROMETHAZINE HCL 25 MG/ML IJ SOLN: 6.2500 mg | INTRAMUSCULAR | Status: DC | PRN

## 2011-06-14 MED ORDER — KETOROLAC TROMETHAMINE 30 MG/ML IJ SOLN
30.0000 mg | Freq: Four times a day (QID) | INTRAMUSCULAR | Status: DC
  Administered 2011-06-14 – 2011-06-15 (×5): 30 mg via INTRAVENOUS
  Filled 2011-06-14 (×5): qty 1

## 2011-06-14 MED ORDER — PANTOPRAZOLE SODIUM 40 MG PO TBEC
40.0000 mg | DELAYED_RELEASE_TABLET | Freq: Every day | ORAL | Status: DC
  Administered 2011-06-14 – 2011-06-15 (×2): 40 mg via ORAL
  Filled 2011-06-14 (×2): qty 1

## 2011-06-14 MED ORDER — ZOLPIDEM TARTRATE 5 MG PO TABS: 5.0000 mg | ORAL_TABLET | Freq: Every evening | ORAL | Status: DC | PRN

## 2011-06-14 MED ORDER — KCL IN DEXTROSE-NACL 20-5-0.45 MEQ/L-%-% IV SOLN
INTRAVENOUS | Status: AC
  Filled 2011-06-14: qty 1000

## 2011-06-14 MED ORDER — GENTAMICIN SULFATE 40 MG/ML IJ SOLN
380.0000 mg | INTRAVENOUS | Status: DC
  Administered 2011-06-14: 380 mg via INTRAVENOUS
  Filled 2011-06-14 (×2): qty 9.5

## 2011-06-14 MED ORDER — VANCOMYCIN HCL IN DEXTROSE 1-5 GM/200ML-% IV SOLN
1000.0000 mg | Freq: Once | INTRAVENOUS | Status: DC
Start: 2011-06-14 — End: 2011-06-14

## 2011-06-14 MED ORDER — BUPIVACAINE-EPINEPHRINE 0.25% -1:200000 IJ SOLN
INTRAMUSCULAR | Status: AC
  Filled 2011-06-14: qty 1

## 2011-06-14 MED ORDER — ESTRADIOL 0.1 MG/GM VA CREA
TOPICAL_CREAM | VAGINAL | Status: DC | PRN
  Administered 2011-06-14: 1 via VAGINAL

## 2011-06-14 MED ORDER — LACTATED RINGERS IV SOLN
INTRAVENOUS | Status: DC | PRN
  Administered 2011-06-14 (×3): via INTRAVENOUS

## 2011-06-14 MED ORDER — FENTANYL CITRATE 0.05 MG/ML IJ SOLN
INTRAMUSCULAR | Status: DC | PRN
  Administered 2011-06-14 (×3): 50 ug via INTRAVENOUS
  Administered 2011-06-14 (×3): 100 ug via INTRAVENOUS
  Administered 2011-06-14: 50 ug via INTRAVENOUS

## 2011-06-14 MED ORDER — MORPHINE SULFATE 2 MG/ML IJ SOLN
2.0000 mg | INTRAMUSCULAR | Status: DC | PRN
  Administered 2011-06-14 (×2): 2 mg via INTRAVENOUS
  Filled 2011-06-14 (×2): qty 1

## 2011-06-14 MED ORDER — VANCOMYCIN HCL IN DEXTROSE 1-5 GM/200ML-% IV SOLN
1000.0000 mg | Freq: Two times a day (BID) | INTRAVENOUS | Status: AC
  Administered 2011-06-14: 1000 mg via INTRAVENOUS
  Filled 2011-06-14: qty 200

## 2011-06-14 MED ORDER — ROCURONIUM BROMIDE 100 MG/10ML IV SOLN
INTRAVENOUS | Status: DC | PRN
  Administered 2011-06-14: 40 mg via INTRAVENOUS
  Administered 2011-06-14 (×2): 20 mg via INTRAVENOUS

## 2011-06-14 MED ORDER — BIOTENE DRY MOUTH MT LIQD: 15.0000 mL | OROMUCOSAL | Status: DC | PRN

## 2011-06-14 MED ORDER — DIPHENHYDRAMINE HCL 50 MG/ML IJ SOLN: 12.5000 mg | Freq: Four times a day (QID) | INTRAMUSCULAR | Status: DC | PRN

## 2011-06-14 MED ORDER — INDIGOTINDISULFONATE SODIUM 8 MG/ML IJ SOLN
INTRAMUSCULAR | Status: AC
  Filled 2011-06-14: qty 10

## 2011-06-14 MED ORDER — ACETAMINOPHEN 325 MG PO TABS
650.0000 mg | ORAL_TABLET | ORAL | Status: DC | PRN
  Administered 2011-06-15: 650 mg via ORAL
  Filled 2011-06-14: qty 2

## 2011-06-14 SURGICAL SUPPLY — 70 items
ALYTE Y-MESH GRAFT (Mesh Specialty) ×3 IMPLANT
CANISTER SUCTION 2500CC (MISCELLANEOUS) ×3 IMPLANT
CATH FOLEY 3WAY 30CC 16FR (CATHETERS) ×3 IMPLANT
CATH ROBINSON RED A/P 16FR (CATHETERS) IMPLANT
CHLORAPREP W/TINT 26ML (MISCELLANEOUS) ×3 IMPLANT
CLIP LIGATING HEM O LOK PURPLE (MISCELLANEOUS) IMPLANT
CLOTH BEACON ORANGE TIMEOUT ST (SAFETY) ×3 IMPLANT
CORDS BIPOLAR (ELECTRODE) ×3 IMPLANT
COVER TIP SHEARS 8 DVNC (MISCELLANEOUS) ×2 IMPLANT
COVER TIP SHEARS 8MM DA VINCI (MISCELLANEOUS) ×1
DECANTER SPIKE VIAL GLASS SM (MISCELLANEOUS) ×3 IMPLANT
DERMABOND ADVANCED (GAUZE/BANDAGES/DRESSINGS) ×1
DERMABOND ADVANCED .7 DNX12 (GAUZE/BANDAGES/DRESSINGS) ×2 IMPLANT
DRAPE INCISE IOBAN 66X45 STRL (DRAPES) ×3 IMPLANT
DRAPE LG THREE QUARTER DISP (DRAPES) ×6 IMPLANT
DRAPE TABLE BACK 44X90 PK DISP (DRAPES) ×3 IMPLANT
DRAPE UTILITY XL STRL (DRAPES) ×3 IMPLANT
DRAPE WARM FLUID 44X44 (DRAPE) ×3 IMPLANT
DRSG TEGADERM 6X8 (GAUZE/BANDAGES/DRESSINGS) ×6 IMPLANT
ELECT REM PT RETURN 9FT ADLT (ELECTROSURGICAL) ×3
ELECTRODE REM PT RTRN 9FT ADLT (ELECTROSURGICAL) ×2 IMPLANT
ETHIBOND 2 0 GREEN CT 2 30IN (SUTURE) IMPLANT
GAUZE PACKING 2X5 YD STERILE (GAUZE/BANDAGES/DRESSINGS) ×3 IMPLANT
GAUZE SPONGE 4X4 16PLY XRAY LF (GAUZE/BANDAGES/DRESSINGS) ×3 IMPLANT
GLOVE BIOGEL M STRL SZ7.5 (GLOVE) ×9 IMPLANT
GOWN STRL NON-REIN LRG LVL3 (GOWN DISPOSABLE) ×21 IMPLANT
HOLDER FOLEY CATH W/STRAP (MISCELLANEOUS) ×3 IMPLANT
KIT ACCESSORY DA VINCI DISP (KITS) ×1
KIT ACCESSORY DVNC DISP (KITS) ×2 IMPLANT
KIT BASIN OR (CUSTOM PROCEDURE TRAY) ×3 IMPLANT
LUBRICANT JELLY K Y 4OZ (MISCELLANEOUS) ×3 IMPLANT
NEEDLE HYPO 25X1 1.5 SAFETY (NEEDLE) ×3 IMPLANT
PACK CYSTO (CUSTOM PROCEDURE TRAY) ×3 IMPLANT
PEN SKIN MARKING BROAD (MISCELLANEOUS) ×3 IMPLANT
PENCIL BUTTON HOLSTER BLD 10FT (ELECTRODE) ×6 IMPLANT
PLUG CATH AND CAP STER (CATHETERS) ×3 IMPLANT
POSITIONER SURGICAL ARM (MISCELLANEOUS) IMPLANT
SCISSORS LAP 5X35 DISP (ENDOMECHANICALS) ×3 IMPLANT
SET CYSTO W/LG BORE CLAMP LF (SET/KITS/TRAYS/PACK) IMPLANT
SET TUBE IRRIG SUCTION NO TIP (IRRIGATION / IRRIGATOR) ×3 IMPLANT
SHEET LAVH (DRAPES) ×3 IMPLANT
SLING SYSTEM SPARC (Sling) ×3 IMPLANT
SOLUTION ANTI FOG 6CC (MISCELLANEOUS) ×3 IMPLANT
SOLUTION ELECTROLUBE (MISCELLANEOUS) ×3 IMPLANT
STAPLER VISISTAT 35W (STAPLE) ×3 IMPLANT
SUCTION POOLE TIP (SUCTIONS) ×3 IMPLANT
SUT ETHILON 3 0 PS 1 (SUTURE) IMPLANT
SUT GORETEX NAB #0 THX26 36IN (SUTURE) ×12 IMPLANT
SUT MNCRL AB 4-0 PS2 18 (SUTURE) ×6 IMPLANT
SUT PROLENE 2 0 CT2 30 (SUTURE) IMPLANT
SUT SILK 2 0 SH (SUTURE) ×6 IMPLANT
SUT VIC AB 0 CT1 27 (SUTURE) ×2
SUT VIC AB 0 CT1 27XBRD ANTBC (SUTURE) ×4 IMPLANT
SUT VIC AB 0 UR5 27 (SUTURE) ×3 IMPLANT
SUT VIC AB 2-0 CT1 27 (SUTURE) ×2
SUT VIC AB 2-0 CT1 TAPERPNT 27 (SUTURE) ×4 IMPLANT
SUT VIC AB 2-0 SH 27 (SUTURE) ×4
SUT VIC AB 2-0 SH 27X BRD (SUTURE) ×8 IMPLANT
SUT VIC AB 4-0 PS1 27 (SUTURE) ×3 IMPLANT
SUT VICRYL 0 UR6 27IN ABS (SUTURE) ×3 IMPLANT
SYR BULB IRRIGATION 50ML (SYRINGE) ×3 IMPLANT
TOWEL OR 17X26 10 PK STRL BLUE (TOWEL DISPOSABLE) ×3 IMPLANT
TOWEL OR NON WOVEN STRL DISP B (DISPOSABLE) ×6 IMPLANT
TRAY FOLEY CATH 14FRSI W/METER (CATHETERS) ×3 IMPLANT
TRAY LAP CHOLE (CUSTOM PROCEDURE TRAY) ×3 IMPLANT
TROCAR ENDOPATH XCEL 12X100 BL (ENDOMECHANICALS) ×3 IMPLANT
TROCAR XCEL 12X100 BLDLESS (ENDOMECHANICALS) ×3 IMPLANT
TUBING INSUFFLATION 10FT LAP (TUBING) ×3 IMPLANT
WATER STERILE IRR 1500ML POUR (IV SOLUTION) ×6 IMPLANT
YANKAUER SUCT BULB TIP 10FT TU (MISCELLANEOUS) ×3 IMPLANT

## 2011-06-14 NOTE — Transfer of Care (Signed)
Immediate Anesthesia Transfer of Care Note  Patient: Makayla [name]  Procedure(s) Performed:  ROBOTIC ASSISTED LAPAROSCOPIC SACROCOLPOPEXY; POSTERIOR REPAIR (RECTOCELE) -  Urethral Sling , Rectocele Repair   Patient Location: PACU  Anesthesia Type: MAC and General  Level of Consciousness: awake   Airway & Oxygen Therapy: Patient Spontanous Breathing  Post-op Assessment: Report given to PACU RN, Post -op Vital signs reviewed and stable and Patient moving all extremities  Post vital signs: Reviewed  Complications: No apparent anesthesia complications

## 2011-06-14 NOTE — Transfer of Care (Signed)
Immediate Anesthesia Transfer of Care Note  Patient: Makayla [name]  Procedure(s) Performed:  ROBOTIC ASSISTED LAPAROSCOPIC SACROCOLPOPEXY; POSTERIOR REPAIR (RECTOCELE) -  Urethral Sling , Rectocele Repair   Patient Location: PACU  Anesthesia Type: Regional  Level of Consciousness: awake, alert  and patient cooperative  Airway & Oxygen Therapy: Patient Spontanous Breathing and Patient connected to face mask oxygen  Post-op Assessment: Report given to PACU RN, Post -op Vital signs reviewed and stable and Patient moving all extremities  Post vital signs: stable  Complications: No apparent anesthesia complications

## 2011-06-14 NOTE — Anesthesia Preprocedure Evaluation (Addendum)
Anesthesia Evaluation  Patient identified by MRN, date of birth, ID band Patient awake    Reviewed: Allergy & Precautions, H&P , NPO status , Patient's Chart, lab work & pertinent test results  History of Anesthesia Complications Negative for: history of anesthetic complications  Airway       Dental   Pulmonary neg pulmonary ROS,          Cardiovascular neg cardio ROS     Neuro/Psych Negative Neurological ROS  Negative Psych ROS   GI/Hepatic Neg liver ROS, GERD-  ,  Endo/Other  Negative Endocrine ROS  Renal/GU negative Renal ROS  Genitourinary negative   Musculoskeletal negative musculoskeletal ROS (+)   Abdominal   Peds negative pediatric ROS (+)  Hematology negative hematology ROS (+)   Anesthesia Other Findings   Reproductive/Obstetrics negative OB ROS                         Anesthesia Physical Anesthesia Plan  ASA: II  Anesthesia Plan: General   Post-op Pain Management:    Induction: Intravenous  Airway Management Planned: Oral ETT  Additional Equipment:   Intra-op Plan:   Post-operative Plan:   Informed Consent: I have reviewed the patients History and Physical, chart, labs and discussed the procedure including the risks, benefits and alternatives for the proposed anesthesia with the patient or authorized representative who has indicated his/her understanding and acceptance.   Dental advisory given  Plan Discussed with: CRNA and Surgeon  Anesthesia Plan Comments:         Anesthesia Quick Evaluation

## 2011-06-14 NOTE — Progress Notes (Signed)
ANTIBIOTIC CONSULT NOTE - INITIAL  Pharmacy Consult for gentamicin Indication: post-op prophylaxis  Allergies  Allergen Reactions  . No Known Drug Allergy     Patient Measurements: Height: 5\' 5"  (165.1 cm) Weight: 221 lb 9 oz (100.5 kg) IBW/kg (Calculated) : 57  Adjusted Body Weight: 74.4kg  Vital Signs: Temp: 97.9 F (36.6 C) (11/08 1539) Temp src: Oral (11/08 1539) BP: 146/81 mmHg (11/08 1539) Pulse Rate: 102  (11/08 1539) Intake/Output from previous day:   Intake/Output from this shift: Total I/O In: 3000 [I.V.:2800; IV Piggyback:200] Out: 900 [Urine:600; Blood:300]  Labs:  Basename 06/14/11 1312  WBC --  HGB 12.8  PLT --  LABCREA --  CREATININE --   Estimated Creatinine Clearance: 96.5 ml/min (by C-G formula based on Cr of 0.81). No results found for this basename: VANCOTROUGH:2,VANCOPEAK:2,VANCORANDOM:2,GENTTROUGH:2,GENTPEAK:2,GENTRANDOM:2,TOBRATROUGH:2,TOBRAPEAK:2,TOBRARND:2,AMIKACINPEAK:2,AMIKACINTROU:2,AMIKACIN:2, in the last 72 hours   Microbiology: Recent Results (from the past 720 hour(s))  SURGICAL PCR SCREEN     Status: Abnormal   Collection Time   06/11/11 10:08 AM      Component Value Range Status Comment   MRSA, PCR NEGATIVE  NEGATIVE  Final    Staphylococcus aureus POSITIVE (*) NEGATIVE  Final     Medical History: Past Medical History  Diagnosis Date  . GERD (gastroesophageal reflux disease)   . Hyperlipidemia   . PMDD (premenstrual dysphoric disorder)   . Bacterial vaginosis   . Fibrocystic breast   . Incontinence of urine     nocturia, frequency, -vaginal vault prolapse-cystocele and rectocele    Medications:   Assessment 50 yo female s/p robotic assisted rectocele repair.  Received gentamicin 80mg  at 6am preop.   Goal of Therapy:  Prevention of postop infection  Plan:  Gentamicin 380mg  IV q24h (5mg /kg based on adjusted body weight 74.4kg).  Anticipated therapy < 24hrs, therefore no need to measure serum  concentrations.  Loralee Pacas R 06/14/2011,4:20 PM

## 2011-06-14 NOTE — Progress Notes (Signed)
Post op note:  Filed Vitals:   06/14/11 1445 06/14/11 1500 06/14/11 1515 06/14/11 1539  BP: 115/60  111/62 146/81  Pulse: 86  87 102  Temp: 97.2 F (36.2 C)   97.9 F (36.6 C)  TempSrc:    Oral  Resp: 12  12 20   Height:    5\' 5"  (1.651 m)  Weight:    100.5 kg (221 lb 9 oz)  SpO2: 98% 98% 97% 96%   Pt AF with VSS.  She is awake and alert.  Pt without complaint.  Pain well controlled.   Instructed for bedrest tonight and to use IS q 1 hour.  She is comfortable and pleased with care thus far.

## 2011-06-14 NOTE — Anesthesia Postprocedure Evaluation (Signed)
  Anesthesia Post-op Note  Patient: Makayla [name]  Procedure(s) Performed:  ROBOTIC ASSISTED LAPAROSCOPIC SACROCOLPOPEXY; POSTERIOR REPAIR (RECTOCELE) - cystoscopy, SPARC sling  Patient Location: PACU  Anesthesia Type: General  Level of Consciousness: awake and alert   Airway and Oxygen Therapy: Patient Spontanous Breathing  Post-op Pain: mild  Post-op Assessment: Post-op Vital signs reviewed, Patient's Cardiovascular Status Stable, Respiratory Function Stable, Patent Airway and No signs of Nausea or vomiting  Post-op Vital Signs: stable  Complications: No apparent anesthesia complications

## 2011-06-14 NOTE — Op Note (Signed)
PREOPERATIVE DIAGNOSIS:  Vaginal vault prolapse and stress incontinence.      POSTOPERATIVE DIAGNOSIS:  Vaginal vault prolapse and stress incontinence.      PROCEDURE:   1. Robotic-assisted laparoscopic sacrocolpopexy.   2. Cystoscopy.      SURGEON:  Dr. Heloise Purpura.      ASSISTANTS:   1.Dr. Lorin Picket MacDiarmid 2. Pecola Leisure, PA     ANESTHESIA:  General.      COMPLICATIONS:  None.      ESTIMATED BLOOD LOSS:  50      SPECIMENS:  None.      INDICATION:  Makayla [name] is a 50 y.o. year old female with post hysterectomy   vaginal vault prolapse.  She was evaluated by Dr. Alfredo Martinez and   it was felt that she would benefit most from an abdominal sacrocolpopexy   procedure.  Due to her wish to proceed with a minimally invasive   procedure, she was evaluated by myself and felt to be a good candidate.   The potential risks, complications, and alternative treatment options   were discussed in detail and informed consent was obtained.      DESCRIPTION OF PROCEDURE:    The patient was taken to the operating room   and a general anesthetic was administered.  She was given preoperative   antibiotics, placed in the dorsal lithotomy position, and prepped and   draped in the usual sterile fashion.  Next, a preoperative time-out was   performed.    A Foley catheter was then inserted into the bladder and a   site was selected just superior to the umbilicus for placement of the   camera port.  The abdomen and peritoneal cavity were able to be entered under direct vision without difficulty.  A 12-mm port was then placed and a pneumoperitoneum   established.  An 8-mm robotic port was placed in the right lower quadrant and a 12-mm   port was placed lateral to this. Two 8- mm robotic ports were then placed in the left lower quadrant and far left lateral quadrant of the abdomen.  All ports were placed under   direct vision without difficulty.  The surgical cart was then docked.    The  sigmoid colon was then retracted to the left of the abdomen with the   4th arm and a 2-0 silk suture placed through the epiploica of the sigmoid colon. Any adhesions were carefully release with a combination of blunt and sharp dissection. The   small intestine was then retracted cephalad and the vaginal apex was   identified with the aid of an EEA sizer.    Once the vaginal apex was carefully identified a horizontal incision was made across the peritoneum overlying the vaginal apex and an anterior and posterior flap of   peritoneum was developed.  Once adequately developed, attention turned   to the sacral promontory.  The overlying peritoneal tissue was incised   and the sacral promontory was exposed.  Care was taken to avoid the   presacral veins.  The peritoneal incision was then opened inferiorly   into the pelvis and preparations were made for placement of the mesh.   The Alyte Y-shaped mesh graft was then prepared and cut to an appropriate length.  This was then brought into the abdomen with each flap placed on the anterior and posterior aspect of the exposed vaginal wall.  2-0 Gore-Tex figure-of-eight sutures were then placed to secure the mesh in place both  anteriorly and posteriorly.  The EEA sizer   was then used to place the appropriate amount of tension on the vagina   and once that tension was released the appropriate amount of   polypropylene mesh was removed from the proximal aspect of the graft. Figure-of-   eight 2-0 Gore-Tex sutures were then placed into the periosteum of the   sacral promontory and secured to the mesh.    The overlying peritoneum was then closed with a running 2-0 Vicryl suture.  In addition, the cul-de- sac on either side of the bladder and vagina was closed by   reapproximating the peritoneum in these areas with 2-0 Vicryl suture.   Indigo carmine was administered and the surgical cart was then undocked.   Cystoscopic exam revealed no evidence of any sutures  within the bladder   and there did appear to be blue efflux from each ureter indicating no   evidence for a ureteral injury.  The Foley catheter was then replaced   and attention turned to closure of the abdominal incisions.  The right   lateral 12-mm port site and the 12-mm camera port site were closed with   0 Vicryl sutures placed laparoscopically and the remaining ports were   then removed under direct vision.  The 0.25% Marcaine was then injected   into all incision sites which were closed at the skin level with 4-0   Monocryl subcuticular closures.  Dermabond was applied to the skin.  The   patient tolerated the procedure well and without complications.     At this point, Dr. Sherron Monday proceeded with a urethral sling procedure which will be dictated separately.           Heloise Purpura, MD

## 2011-06-14 NOTE — Op Note (Signed)
Pt took Magnesium Citrate Fleets enema and clear liquid diet 06/13/11

## 2011-06-14 NOTE — Op Note (Signed)
Preoperative diagnosis: Stress urinary incontinence  Postoperative diagnosis: Stress urinary incontinence  Procedure: Sling cystourethropexy [facility]) and Cystoscopy  Surgeon: Dr. Lorin Picket Rehman Levinson  Asstistant: None  Estimated blood loss: 150 milliliters  Specimens: None  The patient has stress urinary incontinence. After a thorough review of the management options for treatment of stress incontinence she elected to proceed with surgical therapy and the above procedure(s). We have discussed the potential benefits and risk of the procedure, side effects of her proposed treatment, the likelihood of the patient achievingthe goals of the procedure, and any potential problems that might occur during the procedure or recuperation. Informed consent has been obtained.  The patient was prepped and draped in the usual fashion. Extra care was taken with leg positioning to minimize the  risk of compartment syndrome, neuropathy, and deep vein thrombosis. Preoperative laboratory tests were normal and preoperative antibiotics were given.  Two less than 1 cm incisions were made 1 fingerbreadth above the symphysis pubis 1.5 cm lateral to the midline. A 2 cm appropriate depth suburethral incision was made underneath the mid urethra after instilling approximately 5 cc of 1% lidocaine mixture. I sharply and bluntly dissected to the urethral vesical angle bilaterally.  With the bladder empty I passed a SPARC needle on top of and along the back of the symphysis pubis staying  on the periosteum and staying lateral using my box technique and delivering the needle onto the pulp of my  index finger bilaterally.  I cystoscoped the patient thoroughly and there was no injury to the bladder or urethra. There was no movement  or indentation of the bladder with movement of the trocar. There was excellent efflux of blue urine bilaterally.  With the bladder emptied I attached the [facility] sling and brought it up through the  retropubic space bilaterally. I tensioned it over the fat part of a moderate size Kelly clamp. I cut below the blue dots, irrigated the sheaths, and removed the sheaths. I was very happy with the position and tension of the sling With appropriate hypermobility and no springback effect.  All incisions were irrigated. The sling was cut below the skin level. I closed the anterior vaginal wall with  running 2-0 Vicryl followed by my interrupted sutures. Interrupted 4-0 Vicryl was used for the abdominal incisions.  A catheter plug was used with the Foley catheter as well as a vaginal pack.  The patient was taken to the recovery room and hopefully this procedure will reach her treatment goal.   The sling was performed after Dr. Laverle Patter finished the robotic assisted vault suspension. Leg position was excellent.

## 2011-06-14 NOTE — Brief Op Note (Addendum)
06/14/2011  11:52 AM  PATIENT:  Makayla [name]  50 y.o. female  PRE-OPERATIVE DIAGNOSIS:  Vaginal Vault Prolapse Incontinence  POST-OPERATIVE DIAGNOSIS:  vaginal vault prolapse incontinence  PROCEDURE:  Procedure(s): ROBOTIC ASSISTED LAPAROSCOPIC SACROCOLPOPEXY URETHRAL SLING CYSTOSCOPY  SURGEON:  Surgeon(s): Crecencio Mc, MD Martina Sinner, MD  PHYSICIAN ASSISTANT: Pecola Leisure, PA   ANESTHESIA:   general  EBL:  200 cc  BLOOD ADMINISTERED:none  DRAINS: Urinary Catheter (Foley)   LOCAL MEDICATIONS USED:  MARCAINE 30CC  SPECIMEN:  No Specimen  DISPOSITION OF SPECIMEN:  N/A   PATIENT DISPOSITION:  PACU - hemodynamically stable.

## 2011-06-15 MED ORDER — HYDROCODONE-ACETAMINOPHEN 5-325 MG PO TABS: 1.0000 | ORAL_TABLET | Freq: Four times a day (QID) | ORAL | Status: AC | PRN

## 2011-06-15 MED ORDER — HYDROCODONE-ACETAMINOPHEN 5-325 MG PO TABS: 1.0000 | ORAL_TABLET | Freq: Four times a day (QID) | ORAL | Status: DC | PRN

## 2011-06-15 MED ORDER — HYDROCODONE-ACETAMINOPHEN 5-325 MG PO TABS
1.0000 | ORAL_TABLET | Freq: Four times a day (QID) | ORAL | Status: DC | PRN
  Administered 2011-06-15: 2 via ORAL
  Filled 2011-06-15: qty 2

## 2011-06-15 MED ORDER — SULFAMETHOXAZOLE-TMP DS 800-160 MG PO TABS: 1.0000 | ORAL_TABLET | Freq: Two times a day (BID) | ORAL | Status: AC

## 2011-06-15 MED ORDER — KETOROLAC TROMETHAMINE 30 MG/ML IJ SOLN: 15.0000 mg | Freq: Four times a day (QID) | INTRAMUSCULAR | Status: DC

## 2011-06-15 NOTE — Progress Notes (Signed)
Vitals normal Laboratory tests normal- observe mild drop in Hb Patient alert and stable Vomit x1 last night Pain minimal and well-controlled Post treatment course discussed in detail Followup discussed in detail See orders

## 2011-06-15 NOTE — Discharge Summary (Signed)
  Date of admission: 06/14/2011  Date of discharge:06/15/2011  Admission diagnosis: Vaginal vault prolapse and SUI Discharge diagnosis: Vaginal vault prolapse and SUI     History and Physical: For full details, please see admission history and physical. Briefly, Makayla [name] is a 50 year old female with vaginal vault prolapse and stress incontinence.    Hospital Course: She was admitted to the hospital on 06/14/11 after undergoing a robotic sacrocolpopexy and urethral sling.  She remained hemodynamically stable.  Hgb was 12.8 post-op and 10.8 the morning after surgery but remained at 10.8 when rechecked. She ambulated without difficulty and her pain was controlled with oral pain medications.  She was able to pass a voiding trial on POD#1 and was stable for discharge.  Disposition: Home  Discharge instruction: No heavy lifting, straining, or sexual activity.  Discharge medications:  See medication reconciliation.  Followup: 07/10/11

## 2011-07-06 ENCOUNTER — Encounter (HOSPITAL_COMMUNITY): Payer: Self-pay | Admitting: Urology

## 2011-08-07 HISTORY — PX: BREAST EXCISIONAL BIOPSY: SUR124

## 2011-10-16 ENCOUNTER — Telehealth: Payer: Self-pay | Admitting: Family Medicine

## 2011-10-16 DIAGNOSIS — E785 Hyperlipidemia, unspecified: Secondary | ICD-10-CM

## 2011-10-16 DIAGNOSIS — Z0000 Encounter for general adult medical examination without abnormal findings: Secondary | ICD-10-CM | POA: Insufficient documentation

## 2011-10-16 NOTE — Telephone Encounter (Signed)
Message copied by Judy Pimple on Tue Oct 16, 2011 10:26 PM ------      Message from: Makayla [name]      Created: Mon Oct 15, 2011 11:54 AM      Regarding: Cpx labs Wed 3/13       Please order  future cpx labs for pt's upcomming lab appt.      Thanks      Rodney Booze

## 2011-10-17 ENCOUNTER — Other Ambulatory Visit (INDEPENDENT_AMBULATORY_CARE_PROVIDER_SITE_OTHER): Payer: PRIVATE HEALTH INSURANCE

## 2011-10-17 DIAGNOSIS — E7800 Pure hypercholesterolemia, unspecified: Secondary | ICD-10-CM

## 2011-10-17 DIAGNOSIS — Z0000 Encounter for general adult medical examination without abnormal findings: Secondary | ICD-10-CM

## 2011-10-18 LAB — LIPID PANEL
Cholesterol: 223 mg/dL — ABNORMAL HIGH (ref 0–200)
Total CHOL/HDL Ratio: 5.6 Ratio
VLDL: 28 mg/dL (ref 0–40)

## 2011-10-18 LAB — CBC WITH DIFFERENTIAL/PLATELET
Eosinophils Absolute: 0.3 10*3/uL (ref 0.0–0.7)
Eosinophils Relative: 3 % (ref 0–5)
Hemoglobin: 13.5 g/dL (ref 12.0–15.0)
Lymphs Abs: 2.5 10*3/uL (ref 0.7–4.0)
MCH: 26.3 pg (ref 26.0–34.0)
MCV: 83.2 fL (ref 78.0–100.0)
Monocytes Absolute: 0.8 10*3/uL (ref 0.1–1.0)
Monocytes Relative: 9 % (ref 3–12)
RBC: 5.13 MIL/uL — ABNORMAL HIGH (ref 3.87–5.11)

## 2011-10-18 LAB — COMPREHENSIVE METABOLIC PANEL
AST: 11 U/L (ref 0–37)
Albumin: 4.2 g/dL (ref 3.5–5.2)
Alkaline Phosphatase: 94 U/L (ref 39–117)
BUN: 12 mg/dL (ref 6–23)
Creat: 0.97 mg/dL (ref 0.50–1.10)
Potassium: 4.3 mEq/L (ref 3.5–5.3)

## 2011-10-24 ENCOUNTER — Encounter: Payer: PRIVATE HEALTH INSURANCE | Admitting: Family Medicine

## 2011-10-25 ENCOUNTER — Encounter: Payer: Self-pay | Admitting: Family Medicine

## 2011-10-25 ENCOUNTER — Ambulatory Visit (INDEPENDENT_AMBULATORY_CARE_PROVIDER_SITE_OTHER): Payer: PRIVATE HEALTH INSURANCE | Admitting: Family Medicine

## 2011-10-25 VITALS — BP 130/70 | HR 74 | Temp 97.5°F | Ht 65.0 in | Wt 220.8 lb

## 2011-10-25 DIAGNOSIS — Z0000 Encounter for general adult medical examination without abnormal findings: Secondary | ICD-10-CM

## 2011-10-25 DIAGNOSIS — Z1231 Encounter for screening mammogram for malignant neoplasm of breast: Secondary | ICD-10-CM

## 2011-10-25 DIAGNOSIS — R7301 Impaired fasting glucose: Secondary | ICD-10-CM

## 2011-10-25 DIAGNOSIS — E785 Hyperlipidemia, unspecified: Secondary | ICD-10-CM

## 2011-10-25 DIAGNOSIS — E669 Obesity, unspecified: Secondary | ICD-10-CM

## 2011-10-25 NOTE — Assessment & Plan Note (Signed)
At risk for DM with fam hx and obesity Disc strategy for wt loss F/u 3 mo with a1c

## 2011-10-25 NOTE — Assessment & Plan Note (Signed)
Mam sched Will see gyn for exam Dr Shawnie Pons

## 2011-10-25 NOTE — Assessment & Plan Note (Signed)
Reviewed health habits including diet and exercise and skin cancer prevention Also reviewed health mt list, fam hx and immunizations  Rev wellness labs in detail 

## 2011-10-25 NOTE — Assessment & Plan Note (Signed)
Chol up  Disc goals for lipids and reasons to control them Rev labs with pt Rev low sat fat diet in detail  Lab and f/u 3 mo  Consider statin if not imp

## 2011-10-25 NOTE — Progress Notes (Signed)
Subjective:    Patient ID: Makayla [name], female    DOB: 1961/03/17, 51 y.o.   MRN: [id number]  HPI Here for health maintenance exam and to review chronic medical problems   Has been feeling pretty good   Had surgery back in November  Had pubo vag sling with anterior repair - went really well - did a good job  Urinary issues are better  Jan - 8 week check up - ? May have to consider posterior repair later   Hysterectomy in 95 -- partial  Was for fibroids   bp is 130/70 Wt is stable with bmi of 36 Is trying to loose weight  Started gaining weight on vivelle patch  Not eating any different   Diet  Is fair overall - lean meats , not a lot of bread  Exercise-none extra , does walk stairs at work  Hx of high cholesterol  Lab Results  Component Value Date   CHOL 223* 10/17/2011   CHOL 204* 03/02/2010   CHOL 212* 08/11/2009   Lab Results  Component Value Date   HDL 40 10/17/2011   HDL 40 4/69/6295   HDL 43 09/13/4130   Lab Results  Component Value Date   LDLCALC 155* 10/17/2011   LDLCALC 137* 03/02/2010   LDLCALC 139* 08/11/2009   Lab Results  Component Value Date   TRIG 140 10/17/2011   TRIG 136 03/02/2010   TRIG 151* 08/11/2009   Lab Results  Component Value Date   CHOLHDL 5.6 10/17/2011   CHOLHDL 5.1 Ratio 03/02/2010   CHOLHDL 4.9 Ratio 08/11/2009   No results found for this basename: LDLDIRECT     Glucose this check mildly elevated at 114 fasting  Knows this is due to weight   Gyn - has seen Dr Shawnie Pons- is due for follow up   mammo 12/ 11- normal  Self exam-no lumps or changes Needs it sched at [facility]  Flu shot- did not get  Had neurol rxn - to H1N1- similar to GB  colonosc 9/11- was ok - thinks 88 y recall  Patient Active Problem List  Diagnoses  . HYPERLIPIDEMIA  . GERD  . PREMENSTRUAL DYSPHORIC SYNDROME  . MENOPAUSAL SYNDROME  . VAGINAL PRURITUS  . TROCHANTERIC BURSITIS, BILATERAL  . ELEVATED BLOOD PRESSURE WITHOUT DIAGNOSIS OF HYPERTENSION  . Routine general  medical examination at a health care facility  . Obesity  . Fasting hyperglycemia  . Other screening mammogram   Past Medical History  Diagnosis Date  . GERD (gastroesophageal reflux disease)   . Hyperlipidemia   . PMDD (premenstrual dysphoric disorder)   . Bacterial vaginosis   . Fibrocystic breast   . Incontinence of urine     nocturia, frequency, -vaginal vault prolapse-cystocele and rectocele   Past Surgical History  Procedure Date  . Tubal ligation   . Abdominal hysterectomy   . Robotic assisted laparoscopic sacrocolpopexy 06/14/2011    Procedure: ROBOTIC ASSISTED LAPAROSCOPIC SACROCOLPOPEXY;  Surgeon: Crecencio Mc, MD;  Location: [facility];  Service: Urology;  Laterality: N/A;  . Rectocele repair 06/14/2011    Procedure: POSTERIOR REPAIR (RECTOCELE);  Surgeon: Crecencio Mc, MD;  Location: [facility];  Service: Urology;  Laterality: N/A;  cystoscopy, SPARC sling   History  Substance Use Topics  . Smoking status: Never Smoker   . Smokeless tobacco: Never Used  . Alcohol Use: No   Family History  Problem Relation Age of Onset  . Hypertension Mother   . Hyperlipidemia Mother   . Diabetes Mother   .  Cancer Mother     uterine  . GER disease Father   . Depression Sister   . Heart disease Brother   . Heart attack Maternal Grandmother   . Hypertension Maternal Grandmother    Allergies  Allergen Reactions  . No Known Drug Allergy    Current Outpatient Prescriptions on File Prior to Visit  Medication Sig Dispense Refill  . acetaminophen (TYLENOL) 500 MG tablet Take 500 mg by mouth every 6 (six) hours as needed. pain      . esomeprazole (NEXIUM) 40 MG capsule          Review of Systems Review of Systems  Constitutional: Negative for fever, appetite change, fatigue and unexpected weight change.  Eyes: Negative for pain and visual disturbance.  Respiratory: Negative for cough and shortness of breath.   Cardiovascular: Negative for cp or palpitations    Gastrointestinal: Negative  for nausea, diarrhea and constipation.  Genitourinary: Negative for urgency and frequency. no excessive thirst  Skin: Negative for pallor or rash   Neurological: Negative for weakness, light-headedness, numbness and headaches.  Hematological: Negative for adenopathy. Does not bruise/bleed easily.  Psychiatric/Behavioral: Negative for dysphoric mood. The patient is not nervous/anxious.         Objective:   Physical Exam  Constitutional: She appears well-developed and well-nourished. No distress.       Obese and well appearing   HENT:  Head: Normocephalic and atraumatic.  Right Ear: External ear normal.  Left Ear: External ear normal.  Nose: Nose normal.  Mouth/Throat: Oropharynx is clear and moist.  Eyes: Conjunctivae and EOM are normal. Pupils are equal, round, and reactive to light. No scleral icterus.  Neck: Normal range of motion. Neck supple. No JVD present. Carotid bruit is not present. No thyromegaly present.  Cardiovascular: Normal rate, regular rhythm, normal heart sounds and intact distal pulses.  Exam reveals no gallop.   Pulmonary/Chest: Breath sounds normal. No respiratory distress. She has no wheezes.  Abdominal: Soft. Bowel sounds are normal. She exhibits no distension, no abdominal bruit and no mass. There is no tenderness.  Musculoskeletal: Normal range of motion. She exhibits no edema and no tenderness.  Lymphadenopathy:    She has no cervical adenopathy.  Neurological: She is alert. She has normal reflexes. No cranial nerve deficit. She exhibits normal muscle tone. Coordination normal.  Skin: Skin is warm and dry. No rash noted. No erythema. No pallor.  Psychiatric: She has a normal mood and affect.          Assessment & Plan:

## 2011-10-25 NOTE — Patient Instructions (Addendum)
Work on low sugar and low fat diet  Use weight watchers or my fitness pal app Exercise 30 minutes 5 days per week  For cholesterol Avoid red meat/ fried foods/ egg yolks/ fatty breakfast meats/ butter, cheese and high fat dairy/ and shellfish   Schedule fasting lab and then follow up in 3 months  We will schedule mammogram at check out

## 2011-10-25 NOTE — Assessment & Plan Note (Signed)
Discussed how this problem influences overall health and the risks it imposes  Reviewed plan for weight loss with lower calorie diet (via better food choices and also portion control or program like weight watchers) and exercise building up to or more than 30 minutes 5 days per week including some aerobic activity   F/u 3 mo to check progress

## 2011-11-14 ENCOUNTER — Ambulatory Visit: Payer: Self-pay | Admitting: Family Medicine

## 2011-11-14 ENCOUNTER — Encounter: Payer: Self-pay | Admitting: Family Medicine

## 2011-11-16 ENCOUNTER — Ambulatory Visit: Payer: Self-pay | Admitting: Family Medicine

## 2011-11-16 ENCOUNTER — Encounter: Payer: Self-pay | Admitting: Family Medicine

## 2011-11-19 ENCOUNTER — Telehealth: Payer: Self-pay | Admitting: Family Medicine

## 2011-11-19 DIAGNOSIS — R928 Other abnormal and inconclusive findings on diagnostic imaging of breast: Secondary | ICD-10-CM | POA: Insufficient documentation

## 2011-11-19 NOTE — Telephone Encounter (Signed)
I will go ahead with referral now

## 2011-11-19 NOTE — Telephone Encounter (Signed)
Pt had her Mammo at [facility] and Wilkie Aye from [place] was calling about the referral [phone])

## 2011-11-19 NOTE — Telephone Encounter (Signed)
Pt just had mammo they are calling to say that Makayla [name]'s exam came back with positive Malignancy and is classified as a Category 5. They were wondering if you can put in a surgical referral for [name].

## 2011-11-19 NOTE — Telephone Encounter (Signed)
Spoke with patient and advised of mammogram results, patient stated that she would rather see Dr. Chevis Pretty with [facility].  She was transferred to [facility] to schedule.

## 2011-11-20 ENCOUNTER — Telehealth: Payer: Self-pay | Admitting: Family Medicine

## 2011-11-20 ENCOUNTER — Telehealth (INDEPENDENT_AMBULATORY_CARE_PROVIDER_SITE_OTHER): Payer: Self-pay

## 2011-11-20 ENCOUNTER — Other Ambulatory Visit: Payer: Self-pay | Admitting: Family Medicine

## 2011-11-20 DIAGNOSIS — N63 Unspecified lump in unspecified breast: Secondary | ICD-10-CM

## 2011-11-20 NOTE — Telephone Encounter (Signed)
Shirlee Limerick from [facility] called wanting to refer patient to Dr. Carolynne Edouard. Patient had abn mamm and [place] at [facility]. She needs surgical consult appointment. Shirlee Limerick wants to know if Dr. Carolynne Edouard wants biopsy first or if appointment first is ok. D. Carolynne Edouard said preferably biopsy first then appointment with him but he would still see her first if not. Called Shirlee Limerick back and she is going to call [facility] to schedule biopsy.

## 2011-11-20 NOTE — Telephone Encounter (Signed)
Thanks let me know if I need to do anything else.

## 2011-11-20 NOTE — Telephone Encounter (Signed)
Called [facility] to refer patient to Dr Carolynne Edouard. They dont do Biopsys they have patients do them at [facility] first then if they need a surgical consult usually [facility] will call them directly to set this up. I got the patient set up at [facility] for Core needle biopsy for this Friday 11/30/2011 at 3:15pm. I called [facility] and requested the [facility] and [place] be put on a disc for her to pick up and bring for the Biopsy. Patient called and given all of this information.

## 2011-11-23 ENCOUNTER — Ambulatory Visit
Admission: RE | Admit: 2011-11-23 | Discharge: 2011-11-23 | Disposition: A | Discharge status: Home / Self Care | Payer: PRIVATE HEALTH INSURANCE | Source: Ambulatory Visit | Attending: Family Medicine | Admitting: Family Medicine

## 2011-11-23 ENCOUNTER — Other Ambulatory Visit: Payer: Self-pay | Admitting: Family Medicine

## 2011-11-23 DIAGNOSIS — N63 Unspecified lump in unspecified breast: Secondary | ICD-10-CM

## 2011-11-27 ENCOUNTER — Other Ambulatory Visit: Payer: PRIVATE HEALTH INSURANCE

## 2011-12-18 ENCOUNTER — Ambulatory Visit (INDEPENDENT_AMBULATORY_CARE_PROVIDER_SITE_OTHER): Payer: PRIVATE HEALTH INSURANCE | Admitting: Surgery

## 2011-12-19 ENCOUNTER — Other Ambulatory Visit (INDEPENDENT_AMBULATORY_CARE_PROVIDER_SITE_OTHER): Payer: Self-pay | Admitting: General Surgery

## 2011-12-19 ENCOUNTER — Encounter (INDEPENDENT_AMBULATORY_CARE_PROVIDER_SITE_OTHER): Payer: Self-pay | Admitting: General Surgery

## 2011-12-19 ENCOUNTER — Ambulatory Visit (INDEPENDENT_AMBULATORY_CARE_PROVIDER_SITE_OTHER): Payer: PRIVATE HEALTH INSURANCE | Admitting: General Surgery

## 2011-12-19 VITALS — BP 160/98 | HR 102 | Temp 98.2°F | Resp 14 | Ht 65.0 in | Wt 202.6 lb

## 2011-12-19 DIAGNOSIS — R928 Other abnormal and inconclusive findings on diagnostic imaging of breast: Secondary | ICD-10-CM

## 2011-12-19 NOTE — Patient Instructions (Signed)
Plan for wire localized left breast lumpectomy

## 2011-12-19 NOTE — Progress Notes (Signed)
Subjective:     Patient ID: Makayla [name], female   DOB: 06/08/1961, 50 y.o.   MRN: [id number]  HPI We're asked to see the patient in consultation by Dr. Eagle to evaluate her for a left breast mass. The patient is a 50-year-old white female who recently went for a routine screening mammogram at which time an abnormality was found in the upper outer left breast. 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 It was very difficult to tell the difference between the 2 on the needle biopsy. Prior to the biopsy she denied any breast pain. She has not had any breast problems in the past. She has only a maternal first cousin had breast cancer but no other primary relatives. She denies any discharge from her nipple. She is otherwise in good health.  Review of Systems  Constitutional: Negative.   HENT: Negative.   Eyes: Negative.   Respiratory: Negative.   Cardiovascular: Negative.   Gastrointestinal: Negative.   Genitourinary: Negative.   Musculoskeletal: Negative.   Skin: Negative.   Neurological: Negative.   Hematological: Negative.   Psychiatric/Behavioral: Negative.        Objective:   Physical Exam  Constitutional: She is oriented to person, place, and time. She appears well-developed and well-nourished.  HENT:  Head: Normocephalic and atraumatic.  Eyes: Conjunctivae and EOM are normal. Pupils are equal, round, and reactive to light.  Neck: Normal range of motion. Neck supple.  Cardiovascular: Normal rate, regular rhythm and normal heart sounds.   Pulmonary/Chest: Effort normal and breath sounds normal.       She does have a palpable mass about 2 cm in diameter in the upper outer left breast. No other palpable masses in either breast. No palpable axillary supraclavicular or cervical lymphadenopathy.  Abdominal: Soft. Bowel sounds are normal. She exhibits no mass. There is no tenderness.  Musculoskeletal: Normal range of motion.    Neurological: She is alert and oriented to person, place, and time.  Skin: Skin is warm and dry.  Psychiatric: She has a normal mood and affect. Her behavior is normal.       Assessment:     The patient has a mass in the upper outer quadrant of the left breast of uncertain behavior. Because of this I think she would benefit from having this area removed. I have discussed with her in detail the risks and benefits of the operation due to this as well as some of the technical aspects and she understands and wishes to proceed. I think he would be best to have this area wire localized encase what we're feeling is some hematoma.    Plan:     Plan for wire localized left breast lumpectomy.      

## 2012-01-05 DIAGNOSIS — N6320 Unspecified lump in the left breast, unspecified quadrant: Secondary | ICD-10-CM

## 2012-01-05 HISTORY — DX: Unspecified lump in the left breast, unspecified quadrant: N63.20

## 2012-01-07 ENCOUNTER — Encounter (HOSPITAL_BASED_OUTPATIENT_CLINIC_OR_DEPARTMENT_OTHER): Payer: Self-pay | Admitting: *Deleted

## 2012-01-10 ENCOUNTER — Encounter (HOSPITAL_BASED_OUTPATIENT_CLINIC_OR_DEPARTMENT_OTHER)
Admission: RE | Disposition: A | Discharge status: Home / Self Care | Payer: Self-pay | Source: Ambulatory Visit | Attending: General Surgery

## 2012-01-10 ENCOUNTER — Encounter (HOSPITAL_BASED_OUTPATIENT_CLINIC_OR_DEPARTMENT_OTHER): Payer: Self-pay | Admitting: Certified Registered"

## 2012-01-10 ENCOUNTER — Encounter (HOSPITAL_BASED_OUTPATIENT_CLINIC_OR_DEPARTMENT_OTHER): Payer: Self-pay | Admitting: Anesthesiology

## 2012-01-10 ENCOUNTER — Ambulatory Visit
Admission: RE | Admit: 2012-01-10 | Discharge: 2012-01-10 | Disposition: A | Discharge status: Home / Self Care | Payer: PRIVATE HEALTH INSURANCE | Source: Ambulatory Visit | Attending: General Surgery | Admitting: General Surgery

## 2012-01-10 ENCOUNTER — Encounter (HOSPITAL_BASED_OUTPATIENT_CLINIC_OR_DEPARTMENT_OTHER): Payer: Self-pay | Admitting: *Deleted

## 2012-01-10 ENCOUNTER — Ambulatory Visit (HOSPITAL_BASED_OUTPATIENT_CLINIC_OR_DEPARTMENT_OTHER): Payer: PRIVATE HEALTH INSURANCE | Admitting: Anesthesiology

## 2012-01-10 ENCOUNTER — Ambulatory Visit (HOSPITAL_BASED_OUTPATIENT_CLINIC_OR_DEPARTMENT_OTHER)
Admission: RE | Admit: 2012-01-10 | Discharge: 2012-01-10 | Disposition: A | Discharge status: Home / Self Care | Payer: PRIVATE HEALTH INSURANCE | Source: Ambulatory Visit | Attending: General Surgery | Admitting: General Surgery

## 2012-01-10 DIAGNOSIS — C50919 Malignant neoplasm of unspecified site of unspecified female breast: Secondary | ICD-10-CM | POA: Insufficient documentation

## 2012-01-10 DIAGNOSIS — R928 Other abnormal and inconclusive findings on diagnostic imaging of breast: Secondary | ICD-10-CM

## 2012-01-10 DIAGNOSIS — D249 Benign neoplasm of unspecified breast: Secondary | ICD-10-CM | POA: Insufficient documentation

## 2012-01-10 DIAGNOSIS — K219 Gastro-esophageal reflux disease without esophagitis: Secondary | ICD-10-CM | POA: Insufficient documentation

## 2012-01-10 HISTORY — PX: BREAST SURGERY: SHX581

## 2012-01-10 HISTORY — DX: Dental restoration status: Z98.811

## 2012-01-10 HISTORY — DX: Unspecified lump in the left breast, unspecified quadrant: N63.20

## 2012-01-10 SURGERY — BREAST LUMPECTOMY WITH NEEDLE LOCALIZATION
Anesthesia: General | Site: Breast | Laterality: Left | Wound class: Clean

## 2012-01-10 MED ORDER — DEXAMETHASONE SODIUM PHOSPHATE 4 MG/ML IJ SOLN
INTRAMUSCULAR | Status: DC | PRN
  Administered 2012-01-10: 4 mg via INTRAVENOUS

## 2012-01-10 MED ORDER — MIDAZOLAM HCL 2 MG/2ML IJ SOLN: 0.5000 mg | INTRAMUSCULAR | Status: DC | PRN

## 2012-01-10 MED ORDER — CHLORHEXIDINE GLUCONATE 4 % EX LIQD: 1.0000 "application " | Freq: Once | CUTANEOUS | Status: DC

## 2012-01-10 MED ORDER — PROPOFOL 10 MG/ML IV EMUL
INTRAVENOUS | Status: DC | PRN
  Administered 2012-01-10: 200 mg via INTRAVENOUS
  Administered 2012-01-10: 30 mg via INTRAVENOUS

## 2012-01-10 MED ORDER — ACETAMINOPHEN 10 MG/ML IV SOLN
INTRAVENOUS | Status: DC | PRN
  Administered 2012-01-10: 1000 mg via INTRAVENOUS

## 2012-01-10 MED ORDER — BUPIVACAINE HCL (PF) 0.25 % IJ SOLN: INTRAMUSCULAR | Status: DC | PRN

## 2012-01-10 MED ORDER — LACTATED RINGERS IV SOLN
INTRAVENOUS | Status: DC
  Administered 2012-01-10: 11:00:00 via INTRAVENOUS
  Administered 2012-01-10: 20 mL/h via INTRAVENOUS

## 2012-01-10 MED ORDER — HYDROMORPHONE HCL PF 1 MG/ML IJ SOLN: 0.2500 mg | INTRAMUSCULAR | Status: DC | PRN

## 2012-01-10 MED ORDER — DROPERIDOL 2.5 MG/ML IJ SOLN
INTRAMUSCULAR | Status: DC | PRN
  Administered 2012-01-10: 0.625 mg via INTRAVENOUS

## 2012-01-10 MED ORDER — ONDANSETRON HCL 4 MG/2ML IJ SOLN
INTRAMUSCULAR | Status: DC | PRN
  Administered 2012-01-10: 4 mg via INTRAVENOUS

## 2012-01-10 MED ORDER — MIDAZOLAM HCL 5 MG/5ML IJ SOLN
INTRAMUSCULAR | Status: DC | PRN
  Administered 2012-01-10: 2 mg via INTRAVENOUS

## 2012-01-10 MED ORDER — FENTANYL CITRATE 0.05 MG/ML IJ SOLN: 50.0000 ug | INTRAMUSCULAR | Status: DC | PRN

## 2012-01-10 MED ORDER — HYDROCODONE-ACETAMINOPHEN 5-325 MG PO TABS: 1.0000 | ORAL_TABLET | ORAL | Status: AC | PRN

## 2012-01-10 MED ORDER — METOCLOPRAMIDE HCL 5 MG/ML IJ SOLN: 10.0000 mg | Freq: Once | INTRAMUSCULAR | Status: DC | PRN

## 2012-01-10 MED ORDER — CEFAZOLIN SODIUM-DEXTROSE 2-3 GM-% IV SOLR
2.0000 g | INTRAVENOUS | Status: AC
  Administered 2012-01-10: 2 g via INTRAVENOUS

## 2012-01-10 MED ORDER — OXYCODONE HCL 5 MG PO TABS
5.0000 mg | ORAL_TABLET | Freq: Once | ORAL | Status: AC | PRN
  Administered 2012-01-10: 5 mg via ORAL

## 2012-01-10 MED ORDER — BUPIVACAINE-EPINEPHRINE 0.25% -1:200000 IJ SOLN
INTRAMUSCULAR | Status: DC | PRN
  Administered 2012-01-10: 10 mL

## 2012-01-10 MED ORDER — ACETAMINOPHEN 10 MG/ML IV SOLN
1000.0000 mg | Freq: Once | INTRAVENOUS | Status: AC
  Administered 2012-01-10: 1000 mg via INTRAVENOUS

## 2012-01-10 MED ORDER — FENTANYL CITRATE 0.05 MG/ML IJ SOLN
INTRAMUSCULAR | Status: DC | PRN
  Administered 2012-01-10: 50 ug via INTRAVENOUS
  Administered 2012-01-10: 100 ug via INTRAVENOUS

## 2012-01-10 SURGICAL SUPPLY — 43 items
BLADE SURG 10 STRL SS (BLADE) ×2 IMPLANT
BLADE SURG 15 STRL LF DISP TIS (BLADE) ×1 IMPLANT
BLADE SURG 15 STRL SS (BLADE) ×1
CANISTER SUCTION 1200CC (MISCELLANEOUS) ×2 IMPLANT
CHLORAPREP W/TINT 26ML (MISCELLANEOUS) ×2 IMPLANT
CLIP TI WIDE RED SMALL 6 (CLIP) IMPLANT
CLOTH BEACON ORANGE TIMEOUT ST (SAFETY) ×2 IMPLANT
COVER MAYO STAND STRL (DRAPES) ×2 IMPLANT
COVER TABLE BACK 60X90 (DRAPES) ×2 IMPLANT
DECANTER SPIKE VIAL GLASS SM (MISCELLANEOUS) IMPLANT
DERMABOND ADVANCED (GAUZE/BANDAGES/DRESSINGS) ×1
DERMABOND ADVANCED .7 DNX12 (GAUZE/BANDAGES/DRESSINGS) ×1 IMPLANT
DEVICE DUBIN W/COMP PLATE 8390 (MISCELLANEOUS) IMPLANT
DRAPE LAPAROSCOPIC ABDOMINAL (DRAPES) ×2 IMPLANT
DRAPE UTILITY XL STRL (DRAPES) ×2 IMPLANT
ELECT COATED BLADE 2.86 ST (ELECTRODE) ×2 IMPLANT
ELECT REM PT RETURN 9FT ADLT (ELECTROSURGICAL) ×2
ELECTRODE REM PT RTRN 9FT ADLT (ELECTROSURGICAL) ×1 IMPLANT
GLOVE BIO SURGEON STRL SZ 6.5 (GLOVE) ×2 IMPLANT
GLOVE BIO SURGEON STRL SZ7 (GLOVE) ×2 IMPLANT
GLOVE BIO SURGEON STRL SZ7.5 (GLOVE) ×2 IMPLANT
GLOVE BIOGEL PI IND STRL 7.0 (GLOVE) ×2 IMPLANT
GLOVE BIOGEL PI INDICATOR 7.0 (GLOVE) ×2
GOWN PREVENTION PLUS XLARGE (GOWN DISPOSABLE) ×2 IMPLANT
NEEDLE HYPO 25X1 1.5 SAFETY (NEEDLE) ×2 IMPLANT
NS IRRIG 1000ML POUR BTL (IV SOLUTION) ×2 IMPLANT
PACK BASIN DAY SURGERY FS (CUSTOM PROCEDURE TRAY) ×2 IMPLANT
PENCIL BUTTON HOLSTER BLD 10FT (ELECTRODE) ×2 IMPLANT
SLEEVE SCD COMPRESS KNEE MED (MISCELLANEOUS) ×2 IMPLANT
SPONGE LAP 18X18 X RAY DECT (DISPOSABLE) ×2 IMPLANT
STAPLER VISISTAT 35W (STAPLE) IMPLANT
SUT MON AB 4-0 PC3 18 (SUTURE) ×2 IMPLANT
SUT SILK 2 0 SH (SUTURE) ×2 IMPLANT
SUT VIC AB 3-0 54X BRD REEL (SUTURE) IMPLANT
SUT VIC AB 3-0 BRD 54 (SUTURE)
SUT VIC AB 3-0 SH 27 (SUTURE) ×1
SUT VIC AB 3-0 SH 27X BRD (SUTURE) ×1 IMPLANT
SYR CONTROL 10ML LL (SYRINGE) ×2 IMPLANT
TOWEL OR 17X24 6PK STRL BLUE (TOWEL DISPOSABLE) ×2 IMPLANT
TOWEL OR NON WOVEN STRL DISP B (DISPOSABLE) ×2 IMPLANT
TUBE CONNECTING 20X1/4 (TUBING) ×2 IMPLANT
WATER STERILE IRR 1000ML POUR (IV SOLUTION) IMPLANT
YANKAUER SUCT BULB TIP NO VENT (SUCTIONS) ×2 IMPLANT

## 2012-01-10 NOTE — Discharge Instructions (Signed)

## 2012-01-10 NOTE — Transfer of Care (Signed)
Immediate Anesthesia Transfer of Care Note  Patient: Makayla [name]  Procedure(s) Performed: Procedure(s) (LRB): BREAST LUMPECTOMY WITH NEEDLE LOCALIZATION (Left)  Patient Location: PACU  Anesthesia Type: General  Level of Consciousness: sedated and unresponsive  Airway & Oxygen Therapy: Patient Spontanous Breathing and Patient connected to face mask oxygen  Post-op Assessment: Report given to PACU RN and Post -op Vital signs reviewed and stable  Post vital signs: Reviewed and stable  Complications: No apparent anesthesia complications

## 2012-01-10 NOTE — H&P (View-Only) (Signed)
Subjective:     Patient ID: Makayla [name], female   DOB: 1960-08-24, 51 y.o.   MRN: [id number]  HPI We're asked to see the patient in consultation by Dr. Deboraha Sprang to evaluate her for a left breast mass. The patient is a 51 year old white female who recently went for a routine screening mammogram at which time an abnormality was found in the upper outer left breast. 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 It was very difficult to tell the difference between the 2 on the needle biopsy. Prior to the biopsy she denied any breast pain. She has not had any breast problems in the past. She has only a maternal first cousin had breast cancer but no other primary relatives. She denies any discharge from her nipple. She is otherwise in good health.  Review of Systems  Constitutional: Negative.   HENT: Negative.   Eyes: Negative.   Respiratory: Negative.   Cardiovascular: Negative.   Gastrointestinal: Negative.   Genitourinary: Negative.   Musculoskeletal: Negative.   Skin: Negative.   Neurological: Negative.   Hematological: Negative.   Psychiatric/Behavioral: Negative.        Objective:   Physical Exam  Constitutional: She is oriented to person, place, and time. She appears well-developed and well-nourished.  HENT:  Head: Normocephalic and atraumatic.  Eyes: Conjunctivae and EOM are normal. Pupils are equal, round, and reactive to light.  Neck: Normal range of motion. Neck supple.  Cardiovascular: Normal rate, regular rhythm and normal heart sounds.   Pulmonary/Chest: Effort normal and breath sounds normal.       She does have a palpable mass about 2 cm in diameter in the upper outer left breast. No other palpable masses in either breast. No palpable axillary supraclavicular or cervical lymphadenopathy.  Abdominal: Soft. Bowel sounds are normal. She exhibits no mass. There is no tenderness.  Musculoskeletal: Normal range of motion.    Neurological: She is alert and oriented to person, place, and time.  Skin: Skin is warm and dry.  Psychiatric: She has a normal mood and affect. Her behavior is normal.       Assessment:     The patient has a mass in the upper outer quadrant of the left breast of uncertain behavior. Because of this I think she would benefit from having this area removed. I have discussed with her in detail the risks and benefits of the operation due to this as well as some of the technical aspects and she understands and wishes to proceed. I think he would be best to have this area wire localized encase what we're feeling is some hematoma.    Plan:     Plan for wire localized left breast lumpectomy.

## 2012-01-10 NOTE — Interval H&P Note (Signed)
History and Physical Interval Note:  01/10/2012 9:09 AM  Makayla [name]  has presented today for surgery, with the diagnosis of Left breast mass  The various methods of treatment have been discussed with the patient and family. After consideration of risks, benefits and other options for treatment, the patient has consented to  Procedure(s) (LRB): BREAST LUMPECTOMY WITH NEEDLE LOCALIZATION (Left) as a surgical intervention .  The patients' history has been reviewed, patient examined, no change in status, stable for surgery.  I have reviewed the patients' chart and labs.  Questions were answered to the patient's satisfaction.     TOTH III,Tedra Coppernoll S

## 2012-01-10 NOTE — Op Note (Signed)
01/10/2012  10:47 AM  PATIENT:  Makayla [name]  51 y.o. female  PRE-OPERATIVE DIAGNOSIS:  Left breast mass  POST-OPERATIVE DIAGNOSIS:  left breast mass  PROCEDURE:  Procedure(s) (LRB): BREAST LUMPECTOMY WITH NEEDLE LOCALIZATION (Left)  SURGEON:  Surgeon(s) and Role:    * Robyne Askew, MD - Primary  PHYSICIAN ASSISTANT:   ASSISTANTS: none   ANESTHESIA:   general  EBL:  Total I/O In: 1200 [I.V.:1200] Out: -   BLOOD ADMINISTERED:none  DRAINS: none   LOCAL MEDICATIONS USED:  MARCAINE     SPECIMEN:  Source of Specimen:  left breast tissue  DISPOSITION OF SPECIMEN:  PATHOLOGY  COUNTS:  YES  TOURNIQUET:  * No tourniquets in log *  DICTATION: .Dragon Dictation After informed consent was obtained the patient was brought to the operating room and placed in the supine position on the operating room table. After adequate induction of general anesthesia the patient's left breast was prepped with ChloraPrep, allowed to dry, and draped in usual sterile manner. Earlier in the day the patient underwent a wire localization procedure and the wire was entering the left breast in the upper outer quadrant and headed medially. A transversely oriented curvilinear incision was made in the upper outer quadrant of the left breast with a 15 blade knife overlying the path of the wire. This incision was carried through the skin and subcutaneous tissue sharply with the electrocautery. Once in the breast tissue a circular portion of breast tissue was excised sharply around the wire. Once the specimen was removed it was oriented with a short stitch superior and long stitch lateral. The specimen radiograph was obtained that showed the mass to be within the specimen. The specimen was then sent to pathology for further evaluation. Hemostasis was achieved using the Bovie electrocautery. The wound was infiltrated with quarter percent Marcaine. The wound was irrigated with copious amounts of saline. The deep layer  the wound was then closed with interrupted 0 Vicryl stitches. The skin was closed with interrupted 4 Monocryl subcuticular stitches. Dermabond dressings were applied. The patient tolerated the procedure well. At the end of the case the needle sponge and instrument counts were correct. The patient was then awakened and taken to recovery in stable condition.  PLAN OF CARE: Discharge to home after PACU  PATIENT DISPOSITION:  PACU - hemodynamically stable.   Delay start of Pharmacological VTE agent (>24hrs) due to surgical blood loss or risk of bleeding: not applicable

## 2012-01-10 NOTE — Anesthesia Preprocedure Evaluation (Signed)
Anesthesia Evaluation  Patient identified by MRN, date of birth, ID band Patient awake    Reviewed: Allergy & Precautions, H&P , NPO status , Patient's Chart, lab work & pertinent test results, reviewed documented beta blocker date and time   Airway Mallampati: II TM Distance: >3 FB Neck ROM: full    Dental   Pulmonary neg pulmonary ROS,          Cardiovascular negative cardio ROS      Neuro/Psych PSYCHIATRIC DISORDERS negative neurological ROS     GI/Hepatic Neg liver ROS, GERD-  Medicated and Controlled,  Endo/Other  negative endocrine ROS  Renal/GU negative Renal ROS  negative genitourinary   Musculoskeletal   Abdominal   Peds  Hematology negative hematology ROS (+)   Anesthesia Other Findings See surgeon's H&P   Reproductive/Obstetrics negative OB ROS                           Anesthesia Physical Anesthesia Plan  ASA: II  Anesthesia Plan: General   Post-op Pain Management:    Induction: Intravenous  Airway Management Planned: LMA  Additional Equipment:   Intra-op Plan:   Post-operative Plan: Extubation in OR  Informed Consent: I have reviewed the patients History and Physical, chart, labs and discussed the procedure including the risks, benefits and alternatives for the proposed anesthesia with the patient or authorized representative who has indicated his/her understanding and acceptance.   Dental Advisory Given  Plan Discussed with: CRNA and Surgeon  Anesthesia Plan Comments:         Anesthesia Quick Evaluation

## 2012-01-10 NOTE — Anesthesia Procedure Notes (Signed)
Procedure Name: LMA Insertion Date/Time: 01/10/2012 10:02 AM Performed by: Verlan Friends Pre-anesthesia Checklist: Patient identified, Emergency Drugs available, Suction available, Patient being monitored and Timeout performed Patient Re-evaluated:Patient Re-evaluated prior to inductionOxygen Delivery Method: Circle System Utilized Preoxygenation: Pre-oxygenation with 100% oxygen Intubation Type: IV induction Ventilation: Mask ventilation without difficulty LMA: LMA with gastric port inserted LMA Size: 4.0 Number of attempts: 1 (atraumatic) Placement Confirmation: positive ETCO2 Tube secured with: Tape (transpore tape used) Dental Injury: Teeth and Oropharynx as per pre-operative assessment

## 2012-01-10 NOTE — Anesthesia Postprocedure Evaluation (Signed)
Anesthesia Post Note  Patient: Makayla [name]  Procedure(s) Performed: Procedure(s) (LRB): BREAST LUMPECTOMY WITH NEEDLE LOCALIZATION (Left)  Anesthesia type: General  Patient location: PACU  Post pain: Pain level controlled  Post assessment: Patient's Cardiovascular Status Stable  Last Vitals:  Filed Vitals:   01/10/12 1130  BP: 163/90  Pulse: 99  Temp:   Resp: 12    Post vital signs: Reviewed and stable  Level of consciousness: alert  Complications: No apparent anesthesia complications

## 2012-01-14 ENCOUNTER — Telehealth (INDEPENDENT_AMBULATORY_CARE_PROVIDER_SITE_OTHER): Payer: Self-pay | Admitting: Surgery

## 2012-01-14 NOTE — Telephone Encounter (Signed)
Path showed a phylloides tumor with negative (5mm) margin. Called the patient with that report and she will follow up with Dr Carolynne Edouard in terms of further plans

## 2012-01-15 ENCOUNTER — Encounter (INDEPENDENT_AMBULATORY_CARE_PROVIDER_SITE_OTHER): Payer: Self-pay

## 2012-01-22 ENCOUNTER — Encounter (INDEPENDENT_AMBULATORY_CARE_PROVIDER_SITE_OTHER): Payer: Self-pay | Admitting: General Surgery

## 2012-01-22 ENCOUNTER — Ambulatory Visit (INDEPENDENT_AMBULATORY_CARE_PROVIDER_SITE_OTHER): Payer: PRIVATE HEALTH INSURANCE | Admitting: General Surgery

## 2012-01-22 VITALS — BP 132/86 | HR 70 | Temp 98.2°F | Resp 14 | Ht 65.0 in | Wt 195.0 lb

## 2012-01-22 DIAGNOSIS — Z853 Personal history of malignant neoplasm of breast: Secondary | ICD-10-CM

## 2012-01-22 NOTE — Patient Instructions (Signed)
Will refer to med and rad onc Use hydrocortisone cream on rash 2-3 times a day

## 2012-01-22 NOTE — Progress Notes (Signed)
Subjective:     Patient ID: [name], female   DOB: Dec 25, 1960, 51 y.o.   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 Her main complaint today is of a rash around the incision. She thinks she might be allergic to the Dermabond. Otherwise she denies any breast pain.  Review of Systems     Objective:   Physical Exam On exam the incision is healing nicely with no sign of infection. She does have a rash centered around the incision    Assessment:     Status post left breast lumpectomy for a phylloides tumor    Plan:     At this point out like her to see the medical and radiation oncologist to make sure there is no further adjuvant treatment that is necessary. I recommended that she put hydrocortisone cream 2-3 times a day on the rash. We will plan to see her back in one month to check her progress.

## 2012-01-25 ENCOUNTER — Other Ambulatory Visit (INDEPENDENT_AMBULATORY_CARE_PROVIDER_SITE_OTHER): Payer: PRIVATE HEALTH INSURANCE

## 2012-01-25 DIAGNOSIS — E785 Hyperlipidemia, unspecified: Secondary | ICD-10-CM

## 2012-01-25 DIAGNOSIS — R7301 Impaired fasting glucose: Secondary | ICD-10-CM

## 2012-01-25 LAB — LIPID PANEL
LDL Cholesterol: 116 mg/dL — ABNORMAL HIGH (ref 0–99)
Total CHOL/HDL Ratio: 4
Triglycerides: 105 mg/dL (ref 0.0–149.0)

## 2012-01-29 ENCOUNTER — Telehealth (INDEPENDENT_AMBULATORY_CARE_PROVIDER_SITE_OTHER): Payer: Self-pay | Admitting: General Surgery

## 2012-01-29 NOTE — Telephone Encounter (Signed)
Pt calling to let Dr. Carolynne Edouard be aware that the rash (noted at postop office visit on 6/18) being treated with hydrocortisone cream has not improved at all.  Dr. Carolynne Edouard updated and ordered Nystatin cream, # 1 30 gm tube, AAA BID-TID until rash is cleared, 1 RF.  Called Rx to [facility] [address]:  [phone].  Pt is aware.

## 2012-02-04 ENCOUNTER — Ambulatory Visit (INDEPENDENT_AMBULATORY_CARE_PROVIDER_SITE_OTHER): Payer: PRIVATE HEALTH INSURANCE | Admitting: Family Medicine

## 2012-02-04 ENCOUNTER — Encounter: Payer: Self-pay | Admitting: Family Medicine

## 2012-02-04 VITALS — BP 130/80 | HR 83 | Temp 98.2°F | Wt 194.0 lb

## 2012-02-04 DIAGNOSIS — E785 Hyperlipidemia, unspecified: Secondary | ICD-10-CM

## 2012-02-04 DIAGNOSIS — R7301 Impaired fasting glucose: Secondary | ICD-10-CM

## 2012-02-04 DIAGNOSIS — R030 Elevated blood-pressure reading, without diagnosis of hypertension: Secondary | ICD-10-CM

## 2012-02-04 NOTE — Patient Instructions (Addendum)
Cholesterol is much improved a1c is ok  Keep up the great work with diet and exercise and keep working on weight loss

## 2012-02-04 NOTE — Assessment & Plan Note (Signed)
Doing better with diet and exercise and beginning to loose weight  Rev habits a1c is below 6 - reassuring Will continue to follow

## 2012-02-04 NOTE — Assessment & Plan Note (Signed)
White coat syndrome with improvement after resting 130/80 Will continue to follow this  Disc lifestyle habits

## 2012-02-04 NOTE — Assessment & Plan Note (Signed)
Cholesterol is down significantly Commended Disc goals for lipids and reasons to control them Rev labs with pt Rev low sat fat diet in detail  Will re check for next annual exam

## 2012-02-04 NOTE — Progress Notes (Signed)
Subjective:    Patient ID: Makayla [name], female    DOB: [birth year], 51 y.o.   MRN: [id number]  HPI Here for f/u of hyperlipidemia   Improvement this time Lab Results  Component Value Date   CHOL 181 01/25/2012   CHOL 223* 10/17/2011   CHOL 204* 03/02/2010   Lab Results  Component Value Date   HDL 44.30 01/25/2012   HDL 40 10/17/2011   HDL 40 7/82/9562   Lab Results  Component Value Date   LDLCALC 116* 01/25/2012   LDLCALC 155* 10/17/2011   LDLCALC 137* 03/02/2010   Lab Results  Component Value Date   TRIG 105.0 01/25/2012   TRIG 140 10/17/2011   TRIG 136 03/02/2010   Lab Results  Component Value Date   CHOLHDL 4 01/25/2012   CHOLHDL 5.6 10/17/2011   CHOLHDL 5.1 Ratio 03/02/2010   No results found for this basename: LDLDIRECT   diet- quite a bit of improvement -more fruit and veg/ less red meat  Changed to diet or unsweetened beverages  Exercise - was better - slowed down after breast surgery , now getting back to walking   Wt is down 1 lb with bmi of 32  bp high today 150/93-- hx of white coat syndrome  Improved after sitting   Lab Results  Component Value Date   HGBA1C 5.5 01/25/2012   sugar control overall is excellent   BP Readings from Last 3 Encounters:  02/04/12 150/93  01/22/12 132/86  01/10/12 145/81    Is always lower at home and at rest  Patient Active Problem List  Diagnosis  . HYPERLIPIDEMIA  . GERD  . MENOPAUSAL SYNDROME  . TROCHANTERIC BURSITIS, BILATERAL  . ELEVATED BLOOD PRESSURE WITHOUT DIAGNOSIS OF HYPERTENSION  . Routine general medical examination at a health care facility  . Obesity  . Fasting hyperglycemia  . Other screening mammogram  . Abnormal mammogram  . History of malignant phylloides tumor of breast   Past Medical History  Diagnosis Date  . GERD (gastroesophageal reflux disease)   . Hyperlipidemia     no current med.  . Mass of breast, left 01/2012  . Dental crowns present     x 4   Past Surgical History  Procedure Date   . Tubal ligation   . Robotic assisted laparoscopic sacrocolpopexy 06/14/2011    Procedure: ROBOTIC ASSISTED LAPAROSCOPIC SACROCOLPOPEXY;  Surgeon: Crecencio Mc, MD;  Location: [facility];  Service: Urology;  Laterality: N/A;  . Rectocele repair 06/14/2011    Procedure: POSTERIOR REPAIR (RECTOCELE);  Surgeon: Crecencio Mc, MD;  Location: [facility];  Service: Urology;  Laterality: N/A;  cystoscopy, SPARC sling  . Abdominal hysterectomy     partial  . Breast surgery 01/10/12    lumpectomy - left   History  Substance Use Topics  . Smoking status: Never Smoker   . Smokeless tobacco: Never Used  . Alcohol Use: No   Family History  Problem Relation Age of Onset  . Hypertension Mother   . Hyperlipidemia Mother   . Diabetes Mother   . Cancer Mother     uterine  . GER disease Father   . Depression Sister   . Heart disease Brother   . Heart attack Maternal Grandmother   . Hypertension Maternal Grandmother    No Known Allergies Current Outpatient Prescriptions on File Prior to Visit  Medication Sig Dispense Refill  . acetaminophen (TYLENOL) 500 MG tablet Take 500 mg by mouth every 6 (six) hours as needed. pain      .  calcium-vitamin D (OSCAL WITH D) 500-200 MG-UNIT per tablet Take 1 tablet by mouth daily.      Marland Kitchen esomeprazole (NEXIUM) 40 MG capsule 40 mg daily. PM      . DISCONTD: estradiol (VIVELLE-DOT) 0.025 MG/24HR Place 1 patch onto the skin 2 (two) times a week. Monday and friday        Review of Systems Review of Systems  Constitutional: Negative for fever, appetite change, fatigue and unexpected weight change.  Eyes: Negative for pain and visual disturbance.  Respiratory: Negative for cough and shortness of breath.   Cardiovascular: Negative for cp or palpitations    Gastrointestinal: Negative for nausea, diarrhea and constipation.  Genitourinary: Negative for urgency and frequency.  Skin: Negative for pallor or rash   Neurological: Negative for weakness, light-headedness, numbness and  headaches.  Hematological: Negative for adenopathy. Does not bruise/bleed easily.  Psychiatric/Behavioral: Negative for dysphoric mood. The patient is not nervous/anxious.         Objective:   Physical Exam  Constitutional: She appears well-developed and well-nourished. No distress.  HENT:  Head: Normocephalic and atraumatic.  Mouth/Throat: Oropharynx is clear and moist.  Eyes: Conjunctivae and EOM are normal. Pupils are equal, round, and reactive to light. No scleral icterus.  Neck: Normal range of motion. Neck supple. No JVD present. Carotid bruit is not present. No thyromegaly present.  Cardiovascular: Normal rate, regular rhythm, normal heart sounds and intact distal pulses.  Exam reveals no gallop.   Pulmonary/Chest: Effort normal and breath sounds normal. No respiratory distress. She has no wheezes.  Abdominal: Soft. Bowel sounds are normal. She exhibits no distension, no abdominal bruit and no mass. There is no tenderness.  Musculoskeletal: She exhibits no edema.  Lymphadenopathy:    She has no cervical adenopathy.  Neurological: She is alert. She has normal reflexes.  Skin: Skin is warm and dry. No rash noted. No erythema. No pallor.  Psychiatric: She has a normal mood and affect.          Assessment & Plan:

## 2012-02-15 ENCOUNTER — Telehealth: Payer: Self-pay | Admitting: *Deleted

## 2012-02-15 NOTE — Telephone Encounter (Signed)
Left message for pt to return my call.

## 2012-02-22 ENCOUNTER — Ambulatory Visit (INDEPENDENT_AMBULATORY_CARE_PROVIDER_SITE_OTHER): Payer: PRIVATE HEALTH INSURANCE | Admitting: General Surgery

## 2012-02-22 ENCOUNTER — Encounter (INDEPENDENT_AMBULATORY_CARE_PROVIDER_SITE_OTHER): Payer: Self-pay | Admitting: General Surgery

## 2012-02-22 VITALS — BP 124/81 | HR 78 | Temp 97.4°F | Resp 16 | Ht 65.0 in | Wt 191.0 lb

## 2012-02-22 DIAGNOSIS — Z853 Personal history of malignant neoplasm of breast: Secondary | ICD-10-CM

## 2012-02-22 NOTE — Patient Instructions (Addendum)
Will refer to oncology 

## 2012-02-26 ENCOUNTER — Encounter: Payer: Self-pay | Admitting: *Deleted

## 2012-02-26 ENCOUNTER — Ambulatory Visit
Admission: RE | Admit: 2012-02-26 | Discharge: 2012-02-26 | Disposition: A | Discharge status: Home / Self Care | Payer: PRIVATE HEALTH INSURANCE | Source: Ambulatory Visit | Attending: Radiation Oncology | Admitting: Radiation Oncology

## 2012-02-26 ENCOUNTER — Encounter: Payer: Self-pay | Admitting: Radiation Oncology

## 2012-02-26 ENCOUNTER — Telehealth: Payer: Self-pay | Admitting: *Deleted

## 2012-02-26 VITALS — BP 149/80 | HR 89 | Temp 98.5°F | Resp 18 | Ht 65.0 in | Wt 191.0 lb

## 2012-02-26 DIAGNOSIS — C50919 Malignant neoplasm of unspecified site of unspecified female breast: Secondary | ICD-10-CM

## 2012-02-26 DIAGNOSIS — D4860 Neoplasm of uncertain behavior of unspecified breast: Secondary | ICD-10-CM | POA: Insufficient documentation

## 2012-02-26 NOTE — Progress Notes (Signed)
[facility] Radiation Oncology NEW PATIENT EVALUATION  Name: Makayla [name] MRN: [id number]  Date:   02/26/2012           DOB: 1961/05/18  Status: outpatient   CC: Roxy Manns, MD  Robyne Askew, MD , Dr. Pierce Crane   REFERRING PHYSICIAN: Robyne Askew, MD   DIAGNOSIS: Phyllodes tumor, borderline   HISTORY OF PRESENT ILLNESS:  Makayla [name] is a 51 y.o. female who is seen today for the courtesy Dr. Carolynne Edouard for evaluation of her phyllodes tumor.  She underwent screening mammography in April at [facility]. She is found to have a cluster of well circumscribed masses in the upper lateral left breast. Sonography showed a cluster of solid isoechoic-hypoechoic masses at the 1 to 2:00 position, 5 masses in total with the largest being 1.9 x 1.8 x 1.0 cm. One of the masses, measuring 1.0 x 1.5 x 0.7 cm had intense posterior acoustic shadowing, it was felt to be suspicious for malignancy. She was seen at the [facility] and underwent a needle core biopsy on 11/23/2011. She was found to have a biphasic epithelial stromal lesion representing either fibroadenoma or low-grade phyllodes tumor. She was seen by Dr. Carolynne Edouard performed an excisional biopsy on 01/10/2012. She was found to have a 3.1 cm borderline phyllodes tumor with margins negative for atypia or malignancy. There was a separate fibroadenoma. He phyllodes tumor was 0.4 cm from the inked superior margin and 0.5 cm from the inked inferior margin. She is without complaints today. She plans on seeing Dr. Pierce Crane in consultation on July 30.  PREVIOUS RADIATION THERAPY: No   PAST MEDICAL HISTORY:  has a past medical history of GERD (gastroesophageal reflux disease); Hyperlipidemia; Mass of breast, left (01/2012); Dental crowns present; Breast cancer (01/10/12); Bursitis, trochanteric; Hypertension; and Obesity.     PAST SURGICAL HISTORY:  Past Surgical History  Procedure Date  . Tubal ligation   . Robotic  assisted laparoscopic sacrocolpopexy 06/14/2011    Procedure: ROBOTIC ASSISTED LAPAROSCOPIC SACROCOLPOPEXY;  Surgeon: Crecencio Mc, MD;  Location: [facility];  Service: Urology;  Laterality: N/A;  . Rectocele repair 06/14/2011    Procedure: POSTERIOR REPAIR (RECTOCELE);  Surgeon: Crecencio Mc, MD;  Location: [facility];  Service: Urology;  Laterality: N/A;  cystoscopy, SPARC sling  . Abdominal hysterectomy     partial  . Breast surgery 01/10/12    lumpectomy - left     FAMILY HISTORY: family history includes Cancer in her mother; Depression in her sister; Diabetes in her mother; GER disease in her father; Heart attack in her maternal grandmother; Heart disease in her brother; Hyperlipidemia in her mother; and Hypertension in her maternal grandmother and mother. Her father died following suicide at age 77. Her mother is alive and lives independently at age 43. She does have diabetes mellitus. A maternal first cousin was diagnosed with breast cancer in her early 27s.   SOCIAL HISTORY:  reports that she has never smoked. She has never used smokeless tobacco. She reports that she does not drink alcohol or use illicit drugs. Married, one child. 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.   ALLERGIES: Review of patient's allergies indicates no known allergies.   MEDICATIONS:  Current Outpatient Prescriptions  Medication Sig Dispense Refill  . acetaminophen (TYLENOL) 500 MG tablet Take 500 mg by mouth every 6 (six) hours as needed. pain      . calcium-vitamin D (OSCAL WITH  D) 500-200 MG-UNIT per tablet Take 1 tablet by mouth daily.      Marland Kitchen esomeprazole (NEXIUM) 40 MG capsule 40 mg daily. PM      . DISCONTD: estradiol (VIVELLE-DOT) 0.025 MG/24HR Place 1 patch onto the skin 2 (two) times a week. Monday and friday         REVIEW OF SYSTEMS:  Pertinent items are noted in HPI.    PHYSICAL EXAM:  height is 5\' 5"  (1.651 m) and weight is 191 lb (86.637 kg). Her oral temperature is 98.5 F (36.9  C). Her blood pressure is 149/80 and her pulse is 89. Her respiration is 18.   Head and neck examination: Grossly unremarkable. Nodes: There is no palpable cervical, supraclavicular, or axillary lymphadenopathy. Chest: Lungs clear. Heart: Regular in rhythm. Breasts: There is a partial mastectomy wound/scar along the upper outer quadrant of the left breast at 1 to 2:00. No masses are appreciated. Right breast without masses or lesions. Abdomen without hepatomegaly. Extremities without edema.   LABORATORY DATA:  Lab Results  Component Value Date   WBC 8.5 10/17/2011   HGB 15.3* 01/10/2012   HCT 42.7 10/17/2011   MCV 83.2 10/17/2011   PLT 235 10/17/2011   Lab Results  Component Value Date   NA 139 10/17/2011   K 4.3 10/17/2011   CL 105 10/17/2011   CO2 28 10/17/2011   Lab Results  Component Value Date   ALT 13 10/17/2011   AST 11 10/17/2011   ALKPHOS 94 10/17/2011   BILITOT 0.4 10/17/2011      IMPRESSION: Phyllodes tumor, borderline. I explained to the patient that phyllodes tumors are categorized based on histologic aggressiveness into benign, borderline, and malignant. The risk for a local recurrence following conservative surgery appears to be related to size of tumor, surgical margins, and histologic subtype. I feel that her surgical margins are adequate considering her borderline subtype. I would estimate a local recurrence rate of less than 5-10%. Of note is that almost all recurrences retain the histologic pattern of the primary tumor, and thus further surgery at this time would not affect her overall survival. Radiation therapy has been utilized in the past following conservative surgery, and has been shown to decrease the risk for local failure in patients with inadequate margins. Today, radiation therapy is generally reserved for patients with malignant phyllodes tumor and close or involved margins following mastectomy or in cases of recurrent disease.   PLAN: As discussed above. I  recommend followup annual mammography in self breast examination.   I spent 40 minutes minutes face to face with the patient and more than 50% of that time was spent in counseling and/or coordination of care.

## 2012-02-26 NOTE — Progress Notes (Signed)
HERE TODAY FOR CONSULT OF LEFT BREAST.  HAD LUMPECTOMY 01/10/12 BY DR. Carolynne Edouard. SITE LOOKS GOOD.  HAD YEAST INFECTION AT OP SITE , DR. TOTH CALLED IN [facility] FOR THIS AND IT LOOKS GOOD NOW.     BP  149/80   P  89   R 18  T  98.5

## 2012-02-26 NOTE — Telephone Encounter (Signed)
Confirmed 03/04/12 appt w/ pt.  Mailed before appt letter & packet to pt.  Told Dawn so she can let Dr. Carolynne Edouard know.  Emailed Kenall at [facility] to make her aware.  Took paperwork to Med Rec to scan.

## 2012-02-28 ENCOUNTER — Other Ambulatory Visit: Payer: Self-pay | Admitting: *Deleted

## 2012-02-28 DIAGNOSIS — D4860 Neoplasm of uncertain behavior of unspecified breast: Secondary | ICD-10-CM

## 2012-03-04 ENCOUNTER — Other Ambulatory Visit (HOSPITAL_BASED_OUTPATIENT_CLINIC_OR_DEPARTMENT_OTHER): Payer: PRIVATE HEALTH INSURANCE | Admitting: Lab

## 2012-03-04 ENCOUNTER — Ambulatory Visit: Payer: PRIVATE HEALTH INSURANCE

## 2012-03-04 ENCOUNTER — Ambulatory Visit (HOSPITAL_BASED_OUTPATIENT_CLINIC_OR_DEPARTMENT_OTHER): Payer: PRIVATE HEALTH INSURANCE | Admitting: Oncology

## 2012-03-04 VITALS — BP 133/84 | HR 96 | Temp 98.3°F | Ht 65.0 in | Wt 188.4 lb

## 2012-03-04 DIAGNOSIS — D249 Benign neoplasm of unspecified breast: Secondary | ICD-10-CM

## 2012-03-04 DIAGNOSIS — D4860 Neoplasm of uncertain behavior of unspecified breast: Secondary | ICD-10-CM

## 2012-03-04 DIAGNOSIS — Z853 Personal history of malignant neoplasm of breast: Secondary | ICD-10-CM

## 2012-03-04 LAB — COMPREHENSIVE METABOLIC PANEL
ALT: 10 U/L (ref 0–35)
AST: 11 U/L (ref 0–37)
Albumin: 4.1 g/dL (ref 3.5–5.2)
Calcium: 9.8 mg/dL (ref 8.4–10.5)
Chloride: 102 mEq/L (ref 96–112)
Potassium: 3.6 mEq/L (ref 3.5–5.3)
Sodium: 140 mEq/L (ref 135–145)

## 2012-03-04 LAB — CBC WITH DIFFERENTIAL/PLATELET
BASO%: 0.5 % (ref 0.0–2.0)
Basophils Absolute: 0.1 10*3/uL (ref 0.0–0.1)
EOS%: 1.8 % (ref 0.0–7.0)
HGB: 13.6 g/dL (ref 11.6–15.9)
MCH: 27.1 pg (ref 25.1–34.0)
MCHC: 32.1 g/dL (ref 31.5–36.0)
RBC: 5 10*6/uL (ref 3.70–5.45)
RDW: 14.6 % — ABNORMAL HIGH (ref 11.2–14.5)
lymph#: 3.1 10*3/uL (ref 0.9–3.3)

## 2012-03-04 NOTE — Progress Notes (Signed)
Makayla [name] [id number] 25-Dec-1960 51 y.o. 03/04/2012 5:48 PM  CC  Roxy Manns, MD [address] [place] [address], [place] [address]  REASON FOR CONSULTATION:   Patient was seen in the Multidisciplinary Breast Clinic for discussion of her treatment options. She was seen by Dr. Pierce Crane STAGE:   Phyllodes tumor  REFERRING PHYSICIAN: Dr. Laurice Record  HISTORY OF PRESENT ILLNESS:  Makayla [name] is a 51 y.o. female.  For living in [place] who presents with a phyllodes tumor. She has been in good health. She has undergone annual screening mammography. She underwent a screening mammogram in April. She was found to have a cluster of masses in the upper left breast. Ultrasound showed a mass 5 masses in total largest being 1.9 x 1.8 x 1 cm. One of these masses have posterior acoustic shadowing. She underwent a core biopsy the breasts are 11/23/2011. This is felt to be a biphasic epithelial stroma lesion representing either fibroadenoma or low-grade margins were negative. A separate fibroadenoma.. She subsequently underwent a lumpectomy of the left side on 01/10/2012 and this showed a 3.1 cm She has had an unremarkable postoperative course  Past Medical History: Past Medical History  Diagnosis Date  . GERD (gastroesophageal reflux disease)   . Hyperlipidemia     no current med.  . Mass of breast, left 01/2012  . Dental crowns present     x 4  . 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 Bursitis, trochanteric     bi/lat   . Hypertension     white coat syndrome,  . Obesity     Past Surgical History Past Surgical History  Procedure Date  . Tubal ligation   . Robotic assisted laparoscopic sacrocolpopexy 06/14/2011    Procedure: ROBOTIC ASSISTED LAPAROSCOPIC SACROCOLPOPEXY;  Surgeon: Crecencio Mc, MD;  Location: [facility];  Service: Urology;  Laterality: N/A;  . Rectocele repair 06/14/2011   Procedure: POSTERIOR REPAIR (RECTOCELE);  Surgeon: Crecencio Mc, MD;  Location: [facility];  Service: Urology;  Laterality: N/A;  cystoscopy, SPARC sling  . Abdominal hysterectomy     partial  . Breast surgery 01/10/12    lumpectomy - left    Family History: Family History  Problem Relation Age of Onset  . Hypertension Mother   . Hyperlipidemia Mother   . Diabetes Mother   . Cancer Mother     uterine  . GER disease Father   . Depression Sister   . Heart disease Brother   . Heart attack Maternal Grandmother   . Hypertension Maternal Grandmother    maternal first cousin had breast cancer in her 57s.  Social History History  Substance Use Topics  . Smoking status: Never Smoker   . Smokeless tobacco: Never Used  . Alcohol Use: No    Allergies No Known Allergies  Current Medications: Current Outpatient Prescriptions  Medication Sig Dispense Refill  . acetaminophen (TYLENOL) 500 MG tablet Take 500 mg by mouth every 6 (six) hours as needed. pain      . calcium-vitamin D (OSCAL WITH D) 500-200 MG-UNIT per tablet Take 1 tablet by mouth daily.      Marland Kitchen esomeprazole (NEXIUM) 40 MG capsule 40 mg daily. PM      . DISCONTD: estradiol (VIVELLE-DOT) 0.025 MG/24HR Place 1 patch onto the skin 2 (two) times a week. Monday and friday        OB/GYN History: G1 P1 Menarche at  12. Partial hysterectomy and Vivelle patch use July 2012 through January 2013 Fertility Discussion:n/a Prior History of Cancer:no  Health Maintenance:  Colonoscopy yes Bone Density yes Last PAP smearn/a  ECOG PERFORMANCE STATUS: 0 - Asymptomatic  Genetic Counseling/testing:no  REVIEW OF SYSTEMS:  A comprehensive review of systems was negative.  PHYSICAL EXAMINATION: Blood pressure 133/84, pulse 96, temperature 98.3 F (36.8 C), temperature source Oral, height 5\' 5"  (1.651 m), weight 188 lb 6.4 oz (85.458 kg).  HEENT:  Sclerae anicteric, conjunctivae pink.  Oropharynx clear.  No mucositis or candidiasis.   Nodes:  No cervical, supraclavicular, or axillary lymphadenopathy palpated.  Breast Exam:  Right breast is benign.  No masses, discharge, skin change, or nipple inversion.  Left breast is benign. Status post lumpectomy No masses, discharge, skin change, or nipple inversion..  Lungs:  Clear to auscultation bilaterally.  No crackles, rhonchi, or wheezes.  Heart:  Regular rate and rhythm.  Abdomen:  Soft, nontender.  Positive bowel sounds.  No organomegaly or masses palpated.  Musculoskeletal:  No focal spinal tenderness to palpation.  Extremities:  Benign.  No peripheral edema or cyanosis.  Skin:  Benign.  Neuro:  Nonfocal.      STUDIES/RESULTS: No results found.   LABS:    Chemistry      Component Value Date/Time   NA 140 03/04/2012 1604   K 3.6 03/04/2012 1604   CL 102 03/04/2012 1604   CO2 30 03/04/2012 1604   BUN 12 03/04/2012 1604   CREATININE 0.83 03/04/2012 1604   CREATININE 0.97 10/17/2011 0847      Component Value Date/Time   CALCIUM 9.8 03/04/2012 1604   ALKPHOS 102 03/04/2012 1604   AST 11 03/04/2012 1604   ALT 10 03/04/2012 1604   BILITOT 0.2* 03/04/2012 1604      Lab Results  Component Value Date   WBC 11.3* 03/04/2012   HGB 13.6 03/04/2012   HCT 42.2 03/04/2012   MCV 84.4 03/04/2012   PLT 225 03/04/2012       PATHOLOGY: As above  ASSESSMENT    51 year old woman with history of borderline phyllodes tumor. She is status post lumpectomy. She has seen radiation oncology and no mention is been made of adjuvant radiation treatment.  Clinical Trial Eligibility:no Multidisciplinary conference discussion    PLAN:    Visit defined role for any form of adjuvant hormonal therapy is a borderline phyllodes tumor. I recommended that she receive a followup mammogram within 6 months of her surgery. I do not believe that she requires any followup from my perspective. In onset she does admit that she has had a mass worsen ML he detected in her left breast for many years. I recommended  ongoing vigilance between her and her primary care Dr.          Danae Chen you so much for allowing me to participate in the care of Makayla [name]. I will continue to follow up the patient with you and assist in her care.  All questions were answered. The patient knows to call the clinic with any problems, questions or concerns. We can certainly see the patient much sooner if necessary.  I spent 40 minutes counseling the patient face to face. The total time spent in the appointment was 20 minutes.  Pierce Crane M.D., Brandon Melnick. 03/04/2012, 5:48 PM

## 2012-03-06 ENCOUNTER — Encounter: Payer: Self-pay | Admitting: *Deleted

## 2012-03-06 ENCOUNTER — Telehealth: Payer: Self-pay | Admitting: Family Medicine

## 2012-03-06 MED ORDER — IBUPROFEN 800 MG PO TABS: 800.0000 mg | ORAL_TABLET | Freq: Three times a day (TID) | ORAL | Status: DC | PRN

## 2012-03-06 NOTE — Progress Notes (Signed)
Mailed after appt letter to pt. 

## 2012-03-06 NOTE — Telephone Encounter (Signed)
Caller: Denita/Patient; Phone Number: [phone]; Message from caller: Patient has been walking a lot lately.  She is having discomfort in hips/ankles.  She would like Dr.  Milinda Antis to prescribe for her Prescription strength motrin 600 or 800mg  for discomfort with muscle and body aches.  Pharmacy-[facility] [phone].  Please advise.

## 2012-03-06 NOTE — Telephone Encounter (Signed)
I see no contraindication for this....but make sure pt takes for food and takes along with med for reflux. Stop if stomach irritation.

## 2012-03-07 NOTE — Telephone Encounter (Signed)
Patient notified as instructed by telephone. Pt also notified med was sent to [facility] [address]

## 2012-03-11 NOTE — Addendum Note (Signed)
Encounter addended by: Amanda Pea, RN on: 03/11/2012  4:43 PM<BR>     Documentation filed: Charges VN

## 2012-04-01 NOTE — Progress Notes (Signed)
Subjective:     Patient ID: Makayla [name], female   DOB: 04-24-1961, 51 y.o.   MRN: [id number]  HPI The patient is a 51 year old white female who is about 2 months out from a left breast lumpectomy for an intermediate grade phylloides tumor. She indicates that she met with the medical and radiation oncologists and there is no further adjuvant treatment needed. She has no complaints today.  Review of Systems     Objective:   Physical Exam On exam her left breast incision is healing nicely with no sign of infection or seroma    Assessment:     2 months status post left breast lumpectomy for phylloides tumor    Plan:     At this point we will plan to see her back in about 3 months. She will continue to do regular self exams.

## 2012-04-29 ENCOUNTER — Other Ambulatory Visit: Payer: Self-pay | Admitting: Internal Medicine

## 2012-04-29 NOTE — Telephone Encounter (Signed)
Will refill electronically  

## 2012-05-20 ENCOUNTER — Ambulatory Visit (INDEPENDENT_AMBULATORY_CARE_PROVIDER_SITE_OTHER): Payer: PRIVATE HEALTH INSURANCE | Admitting: General Surgery

## 2012-05-23 ENCOUNTER — Ambulatory Visit (INDEPENDENT_AMBULATORY_CARE_PROVIDER_SITE_OTHER): Payer: PRIVATE HEALTH INSURANCE | Admitting: General Surgery

## 2012-05-23 ENCOUNTER — Encounter (INDEPENDENT_AMBULATORY_CARE_PROVIDER_SITE_OTHER): Payer: Self-pay | Admitting: General Surgery

## 2012-05-23 VITALS — BP 126/84 | HR 82 | Temp 98.0°F | Resp 18 | Ht 65.0 in | Wt 178.0 lb

## 2012-05-23 DIAGNOSIS — D4860 Neoplasm of uncertain behavior of unspecified breast: Secondary | ICD-10-CM

## 2012-05-23 NOTE — Patient Instructions (Signed)
Continue regular self exams  

## 2012-06-23 NOTE — Progress Notes (Signed)
Subjective:     Patient ID: Makayla [name], female   DOB: [birth year], 51 y.o.   MRN: [id number]  HPI The pt is a 51 yo wf who is 5 months s/p left breast lumpectomy for an intermediate grade phylloides tumor. She has been well since her last visit. She denies any breast pain or discharge from the nipple.  Review of Systems  Constitutional: Negative.   HENT: Negative.   Eyes: Negative.   Respiratory: Negative.   Cardiovascular: Negative.   Gastrointestinal: Negative.   Genitourinary: Negative.   Musculoskeletal: Negative.   Skin: Negative.   Neurological: Negative.   Hematological: Negative.   Psychiatric/Behavioral: Negative.        Objective:   Physical Exam  Constitutional: She is oriented to person, place, and time. She appears well-developed and well-nourished.  HENT:  Head: Normocephalic and atraumatic.  Eyes: Conjunctivae normal and EOM are normal. Pupils are equal, round, and reactive to light.  Neck: Normal range of motion. Neck supple.  Pulmonary/Chest: Effort normal and breath sounds normal.       Her left breast incision has healed nicely. There is no palpable mass in either breast. There is no palpable axillary, supraclavicular, or cervical lymphadenopathy  Abdominal: Soft. Bowel sounds are normal. She exhibits no mass. There is no tenderness.  Musculoskeletal: Normal range of motion.  Lymphadenopathy:    She has no cervical adenopathy.  Neurological: She is alert and oriented to person, place, and time.  Skin: Skin is warm and dry.  Psychiatric: She has a normal mood and affect. Her behavior is normal.       Assessment:     5 months s/p left breast lumpectomy for a phylloides tumor. She did not require any adjuvant therapy    Plan:     At this point she will continue to do regular self exams. We will plan to see her back in 6 months

## 2012-07-21 ENCOUNTER — Other Ambulatory Visit: Payer: Self-pay | Admitting: Family Medicine

## 2012-08-25 ENCOUNTER — Ambulatory Visit (INDEPENDENT_AMBULATORY_CARE_PROVIDER_SITE_OTHER): Payer: PRIVATE HEALTH INSURANCE | Admitting: Family Medicine

## 2012-08-25 ENCOUNTER — Encounter: Payer: Self-pay | Admitting: Family Medicine

## 2012-08-25 VITALS — BP 120/60 | HR 87 | Temp 97.6°F | Ht 65.0 in | Wt 173.8 lb

## 2012-08-25 DIAGNOSIS — M7061 Trochanteric bursitis, right hip: Secondary | ICD-10-CM

## 2012-08-25 DIAGNOSIS — M76899 Other specified enthesopathies of unspecified lower limb, excluding foot: Secondary | ICD-10-CM

## 2012-08-25 NOTE — Progress Notes (Signed)
Nature conservation officer at [facility] [address] [place] [address] Phone: [phone] Fax: [phone]  Date:  08/25/2012   Name:  Makayla [name]   DOB:  Feb 15, 1961   MRN:  [id number] Gender: female Age: 52 y.o.  PCP:  Roxy Manns, MD  Evaluating MD: Hannah Beat, MD   Chief Complaint: Follow-up and Hip Pain   History of Present Illness:  Makayla [name] is a 52 y.o. pleasant patient who presents with the following:  R classic GTB:  Hurting really bad again. Starting to hurt around November.  Has been exercising more, walking.  Nothing too strenuous. No incident or trauma that she can think of No groin pain No back pain or radiculopathy  Patient Active Problem List  Diagnosis  . HYPERLIPIDEMIA  . GERD  . MENOPAUSAL SYNDROME  . TROCHANTERIC BURSITIS, BILATERAL  . ELEVATED BLOOD PRESSURE WITHOUT DIAGNOSIS OF HYPERTENSION  . Routine general medical examination at a health care facility  . Obesity  . Fasting hyperglycemia  . Other screening mammogram  . Abnormal mammogram  . History of malignant phylloides tumor of breast  . Breast cancer  . Phyllodes tumor    Past Medical History  Diagnosis Date  . GERD (gastroesophageal reflux disease)   . Hyperlipidemia     no current med.  . Mass of breast, left 01/2012  . Dental crowns present     x 4  . 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 Bursitis, trochanteric     bi/lat   . Hypertension     white coat syndrome,  . Obesity     Past Surgical History  Procedure Date  . Tubal ligation   . Robotic assisted laparoscopic sacrocolpopexy 06/14/2011    Procedure: ROBOTIC ASSISTED LAPAROSCOPIC SACROCOLPOPEXY;  Surgeon: Crecencio Mc, MD;  Location: [facility];  Service: Urology;  Laterality: N/A;  . Rectocele repair 06/14/2011    Procedure: POSTERIOR REPAIR (RECTOCELE);  Surgeon: Crecencio Mc, MD;  Location: [facility];  Service: Urology;   Laterality: N/A;  cystoscopy, SPARC sling  . Abdominal hysterectomy     partial  . Breast surgery 01/10/12    lumpectomy - left    History  Substance Use Topics  . Smoking status: Never Smoker   . Smokeless tobacco: Never Used  . Alcohol Use: No    Family History  Problem Relation Age of Onset  . Hypertension Mother   . Hyperlipidemia Mother   . Diabetes Mother   . Cancer Mother     uterine  . GER disease Father   . Depression Sister   . Heart disease Brother   . Heart attack Maternal Grandmother   . Hypertension Maternal Grandmother     No Known Allergies  Medication list has been reviewed and updated.  Outpatient Prescriptions Prior to Visit  Medication Sig Dispense Refill  . acetaminophen (TYLENOL) 500 MG tablet Take 500 mg by mouth every 6 (six) hours as needed. pain      . calcium-vitamin D (OSCAL WITH D) 500-200 MG-UNIT per tablet Take 1 tablet by mouth daily.      Marland Kitchen ibuprofen (ADVIL,MOTRIN) 800 MG tablet TAKE ONE TABLET BY MOUTH EVERY 8 HOURS AS NEEDED FOR PAIN  30 tablet  0  . NEXIUM 40 MG capsule TAKE ONE CAPSULE BY MOUTH TWICE DAILY  60 capsule  11   Last reviewed on 08/25/2012  9:01 AM by Consuello Masse, CMA  Review  of Systems:   GEN: No fevers, chills. Nontoxic. Primarily MSK c/o today. MSK: Detailed in the HPI GI: tolerating PO intake without difficulty Neuro: No numbness, parasthesias, or tingling associated. Otherwise the pertinent positives of the ROS are noted above.    Physical Examination: BP 120/60  Pulse 87  Temp 97.6 F (36.4 C) (Oral)  Ht 5\' 5"  (1.651 m)  Wt 173 lb 12 oz (78.812 kg)  BMI 28.91 kg/m2  SpO2 99%  Ideal Body Weight: Weight in (lb) to have BMI = 25: 149.9    GEN: WDWN, NAD, Non-toxic, Alert & Oriented x 3 HEENT: Atraumatic, Normocephalic.  Ears and Nose: No external deformity. EXTR: No clubbing/cyanosis/edema NEURO: Normal gait.  PSYCH: Normally interactive. Conversant. Not depressed or anxious appearing.  Calm  demeanor.   HIP EXAM: SIDE: R ROM: Abduction, Flexion, Internal and External range of motion: full Pain with terminal IROM and EROM: none GTB: + on R SLR: NEG Knees: No effusion FABER: NT REVERSE FABER: NT, neg Piriformis: NT at direct palpation Str: flexion: 5/5 abduction: 4/5 on R and 4+/5 on the L adduction: 5/5     Assessment and Plan:  1. Greater trochanteric bursitis of right hip    I reviewed with the patient the structures involved and how they related to diagnosis.   Patient was given a rehabilitation protocol emphasizing core rehab, partcularly addressing abductor weakness, and stretching of the pelvic musculature, hips, and ITB.  Trochanteric bursa injections have clearly been shown to help with acute bursitis, and the patient would benefit.  Trochanteric Bursitis Injection, RIGHT Verbal consent obtained. Risks (including infection, potential atrophy), benefits, and alternatives reviewed. Greater trochanter sterilely prepped with Chloraprep. Ethyl Chloride used for anesthesia. 8 cc of Lidocaine 1% injected with 2 cc of 40 mg Depo-Medrol into trochanteric bursa at area of maximal tenderness at greater trochanter. Needle taken to bone to troch bursa, flows easily. Bursa massaged. No bleeding and no complications. Decreased pain after injection. Needle: 22 gauge spinal needle   Orders Today:  No orders of the defined types were placed in this encounter.    Updated Medication List: (Includes new medications, updates to list, dose adjustments) No orders of the defined types were placed in this encounter.    Medications Discontinued: There are no discontinued medications.   Hannah Beat, MD

## 2012-11-24 ENCOUNTER — Encounter (INDEPENDENT_AMBULATORY_CARE_PROVIDER_SITE_OTHER): Payer: Self-pay | Admitting: General Surgery

## 2012-11-24 ENCOUNTER — Ambulatory Visit (INDEPENDENT_AMBULATORY_CARE_PROVIDER_SITE_OTHER): Payer: PRIVATE HEALTH INSURANCE | Admitting: General Surgery

## 2012-11-24 ENCOUNTER — Encounter (INDEPENDENT_AMBULATORY_CARE_PROVIDER_SITE_OTHER): Payer: Self-pay

## 2012-11-24 VITALS — BP 120/70 | HR 76 | Temp 96.8°F | Resp 18 | Ht 65.0 in | Wt 168.2 lb

## 2012-11-24 DIAGNOSIS — D4862 Neoplasm of uncertain behavior of left breast: Secondary | ICD-10-CM

## 2012-11-24 DIAGNOSIS — D4860 Neoplasm of uncertain behavior of unspecified breast: Secondary | ICD-10-CM

## 2012-11-24 NOTE — Patient Instructions (Signed)
Continue regular self exams  

## 2012-11-24 NOTE — Progress Notes (Signed)
Subjective:     Patient ID: Makayla [name], female   DOB: May 25, 1961, 52 y.o.   MRN: [id number]  HPI The patient is a 52 year old white female who is 52 year status post left breast lumpectomy for an intermediate grade phylloides tumor. She did not require any further adjuvant therapy. Her only complaint is of some occasional sharp shooting pains to the left breast at did not last very long. She denies any discharge or nipple. She is due for her next mammogram and would like to stay in [place] at the breast center.  Review of Systems  Constitutional: Negative.   HENT: Negative.   Eyes: Negative.   Respiratory: Negative.   Cardiovascular: Negative.   Gastrointestinal: Negative.   Endocrine: Negative.   Genitourinary: Negative.   Musculoskeletal: Negative.   Skin: Negative.   Allergic/Immunologic: Negative.   Neurological: Negative.   Hematological: Negative.   Psychiatric/Behavioral: Negative.        Objective:   Physical Exam  Constitutional: She is oriented to person, place, and time. She appears well-developed and well-nourished.  HENT:  Head: Normocephalic and atraumatic.  Eyes: Conjunctivae and EOM are normal. Pupils are equal, round, and reactive to light.  Neck: Normal range of motion. Neck supple.  Cardiovascular: Normal rate, regular rhythm and normal heart sounds.   Pulmonary/Chest: Effort normal and breath sounds normal.  There is no palpable mass in either breast. There is no palpable axillary or supraclavicular cervical lymphadenopathy.  Abdominal: Soft. Bowel sounds are normal. She exhibits no mass. There is no tenderness.  Musculoskeletal: Normal range of motion.  Lymphadenopathy:    She has no cervical adenopathy.  Neurological: She is alert and oriented to person, place, and time.  Skin: Skin is warm and dry.  Psychiatric: She has a normal mood and affect. Her behavior is normal.       Assessment:     The patient is 52 year status post left breast  lumpectomy for an intermediate grade phyllodes tumor     Plan:     At this point she will continue to do regular self exams. We will order for mammogram to be done at the breast center upstairs. If this is also negative we will plan to see her back in 6 month

## 2012-12-01 ENCOUNTER — Ambulatory Visit (INDEPENDENT_AMBULATORY_CARE_PROVIDER_SITE_OTHER): Payer: PRIVATE HEALTH INSURANCE | Admitting: Family Medicine

## 2012-12-01 ENCOUNTER — Encounter: Payer: Self-pay | Admitting: Family Medicine

## 2012-12-01 VITALS — BP 104/62 | HR 66 | Temp 97.5°F | Ht 64.25 in | Wt 168.0 lb

## 2012-12-01 DIAGNOSIS — E785 Hyperlipidemia, unspecified: Secondary | ICD-10-CM

## 2012-12-01 DIAGNOSIS — Z0000 Encounter for general adult medical examination without abnormal findings: Secondary | ICD-10-CM

## 2012-12-01 LAB — CBC WITH DIFFERENTIAL/PLATELET
Basophils Absolute: 0.1 10*3/uL (ref 0.0–0.1)
Eosinophils Absolute: 0.2 10*3/uL (ref 0.0–0.7)
Lymphocytes Relative: 32.9 % (ref 12.0–46.0)
Lymphs Abs: 2.8 10*3/uL (ref 0.7–4.0)
MCHC: 33.3 g/dL (ref 30.0–36.0)
Monocytes Relative: 5.6 % (ref 3.0–12.0)
Platelets: 154 10*3/uL (ref 150.0–400.0)
RDW: 13.9 % (ref 11.5–14.6)

## 2012-12-01 LAB — COMPREHENSIVE METABOLIC PANEL
ALT: 10 U/L (ref 0–35)
AST: 12 U/L (ref 0–37)
CO2: 32 mEq/L (ref 19–32)
Chloride: 105 mEq/L (ref 96–112)
GFR: 86.02 mL/min (ref >60.00)
Sodium: 140 mEq/L (ref 135–145)
Total Bilirubin: 0.7 mg/dL (ref 0.3–1.2)
Total Protein: 6.9 g/dL (ref 6.0–8.3)

## 2012-12-01 LAB — LIPID PANEL
HDL: 45.7 mg/dL (ref >39.00)
Total CHOL/HDL Ratio: 4
VLDL: 14.8 mg/dL (ref 0.0–40.0)

## 2012-12-01 NOTE — Assessment & Plan Note (Signed)
Reviewed health habits including diet and exercise and skin cancer prevention Also reviewed health mt list, fam hx and immunizations  Lab today for wellness Nl breast exam today with scar on L from Phyllodes tumor- she has ongoing ca f/u and mammo this week

## 2012-12-01 NOTE — Patient Instructions (Addendum)
Labs today Keep up the good work with diet and exercise Wear your sun protection Get your mammogram as planned  Try to get 1200-1500 mg of calcium per day with at least 1000 iu of vitamin D - for bone health

## 2012-12-01 NOTE — Progress Notes (Signed)
Subjective:    Patient ID: Makayla [name], female    DOB: October 19, 1960, 52 y.o.   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 Has some scar tissue - had exam with Dr Carolynne Edouard- all was ok  Mammogram for f/u is this Friday - she is a little nervous   Td 9/08 She declines flu vaccines   bp today is very good   Mood-has been good / and no depression - remains motivated  Is eating a healthy diet  Is eating more fiber  Does drink lots of water  Was walking every day for a while- then got bursitis   (saw Dr Patsy Lager and had injection)  She is using myfitnesspal app- really likes that  Wt is stable - had previously lost a lot- needs to increase her exercise   Hyperlipidemia Lab Results  Component Value Date   CHOL 181 01/25/2012   HDL 44.30 01/25/2012   LDLCALC 116* 01/25/2012   TRIG 105.0 01/25/2012   CHOLHDL 4 01/25/2012     Patient Active Problem List   Diagnosis Date Noted  . 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 Breast cancer 01/10/2012  . Abnormal mammogram 11/19/2011  . Other screening mammogram 10/25/2011  . Routine general medical examination at a health care facility 10/16/2011  . MENOPAUSAL SYNDROME 03/06/2010  . HYPERLIPIDEMIA 12/04/2007  . GERD 12/04/2007  . ELEVATED BLOOD PRESSURE WITHOUT DIAGNOSIS OF HYPERTENSION 12/04/2007   Past Medical History  Diagnosis Date  . GERD (gastroesophageal reflux disease)   . Hyperlipidemia     no current  med.  . Mass of breast, left 01/2012  . Dental crowns present     x 4  . 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 Bursitis, trochanteric     bi/lat   . Hypertension     white coat syndrome,  . Obesity    Past Surgical History  Procedure Laterality Date  . Tubal ligation    . Robotic assisted laparoscopic sacrocolpopexy  06/14/2011    Procedure: ROBOTIC ASSISTED LAPAROSCOPIC SACROCOLPOPEXY;  Surgeon: Crecencio Mc, MD;  Location: [facility];  Service: Urology;  Laterality: N/A;  . Rectocele repair  06/14/2011    Procedure: POSTERIOR REPAIR (RECTOCELE);  Surgeon: Crecencio Mc, MD;  Location: [facility];  Service: Urology;  Laterality: N/A;  cystoscopy, SPARC sling  . Abdominal hysterectomy      partial  . Breast surgery  01/10/12    lumpectomy - left   History  Substance Use Topics  . Smoking status: Never Smoker   . Smokeless tobacco: Never Used  . Alcohol Use: No   Family History  Problem Relation Age of Onset  . Hypertension Mother   . Hyperlipidemia Mother   . Diabetes Mother   . Cancer Mother     uterine  . GER disease Father   . Depression  Sister   . Heart disease Brother   . Heart attack Maternal Grandmother   . Hypertension Maternal Grandmother    No Known Allergies Current Outpatient Prescriptions on File Prior to Visit  Medication Sig Dispense Refill  . calcium-vitamin D (OSCAL WITH D) 500-200 MG-UNIT per tablet Take 1 tablet by mouth daily.      Marland Kitchen NEXIUM 40 MG capsule TAKE ONE CAPSULE BY MOUTH TWICE DAILY  60 capsule  11  . [DISCONTINUED] estradiol (VIVELLE-DOT) 0.025 MG/24HR Place 1 patch onto the skin 2 (two) times a week. Monday and friday       No current facility-administered medications on file prior to visit.    Review of Systems Review of Systems  Constitutional: Negative for fever, appetite change, fatigue and unexpected weight change.  Eyes: Negative for pain and visual disturbance.   Respiratory: Negative for cough and shortness of breath.   Cardiovascular: Negative for cp or palpitations    Gastrointestinal: Negative for nausea, diarrhea and constipation.  Genitourinary: Negative for urgency and frequency.  Skin: Negative for pallor or rash   Neurological: Negative for weakness, light-headedness, numbness and headaches.  Hematological: Negative for adenopathy. Does not bruise/bleed easily.  Psychiatric/Behavioral: Negative for dysphoric mood. The patient is not nervous/anxious.         Objective:   Physical Exam  Constitutional: She appears well-developed and well-nourished. No distress.  overwt and well appearing   HENT:  Head: Normocephalic and atraumatic.  Right Ear: External ear normal.  Left Ear: External ear normal.  Nose: Nose normal.  Mouth/Throat: Oropharynx is clear and moist.  Eyes: Conjunctivae and EOM are normal. Pupils are equal, round, and reactive to light. Right eye exhibits no discharge. Left eye exhibits no discharge. No scleral icterus.  Neck: Normal range of motion. Neck supple. No JVD present. Carotid bruit is not present. No thyromegaly present.  Cardiovascular: Normal rate, regular rhythm, normal heart sounds and intact distal pulses.  Exam reveals no gallop.   Pulmonary/Chest: Effort normal and breath sounds normal. No respiratory distress. She has no wheezes. She has no rales.  Abdominal: Soft. Bowel sounds are normal. She exhibits no distension, no abdominal bruit and no mass. There is no tenderness.  Genitourinary: No breast swelling, tenderness, discharge or bleeding.  Breast exam: No mass, nodules, thickening, tenderness, bulging, retraction, inflamation, nipple discharge or skin changes noted.  No axillary or clavicular LA.  Chaperoned exam.  Scar on L lateral breast is well healed   Musculoskeletal: She exhibits no edema and no tenderness.  Lymphadenopathy:    She has no cervical adenopathy.  Neurological: She is alert. She has  normal reflexes. No cranial nerve deficit. She exhibits normal muscle tone. Coordination normal.  Skin: Skin is warm and dry. No rash noted. No erythema. No pallor.  Psychiatric: She has a normal mood and affect.          Assessment & Plan:

## 2012-12-01 NOTE — Assessment & Plan Note (Signed)
Lab today This has imp with better diet and wt loss Disc low sat fat diet and goals

## 2012-12-02 LAB — TSH: TSH: 2.48 u[IU]/mL (ref 0.35–5.50)

## 2012-12-05 ENCOUNTER — Ambulatory Visit
Admission: RE | Admit: 2012-12-05 | Discharge: 2012-12-05 | Disposition: A | Discharge status: Home / Self Care | Payer: PRIVATE HEALTH INSURANCE | Source: Ambulatory Visit | Attending: General Surgery | Admitting: General Surgery

## 2012-12-05 ENCOUNTER — Other Ambulatory Visit (INDEPENDENT_AMBULATORY_CARE_PROVIDER_SITE_OTHER): Payer: Self-pay | Admitting: General Surgery

## 2012-12-05 DIAGNOSIS — D4862 Neoplasm of uncertain behavior of left breast: Secondary | ICD-10-CM

## 2012-12-10 ENCOUNTER — Other Ambulatory Visit (INDEPENDENT_AMBULATORY_CARE_PROVIDER_SITE_OTHER): Payer: Self-pay | Admitting: General Surgery

## 2012-12-10 ENCOUNTER — Telehealth (INDEPENDENT_AMBULATORY_CARE_PROVIDER_SITE_OTHER): Payer: Self-pay

## 2012-12-10 DIAGNOSIS — D4862 Neoplasm of uncertain behavior of left breast: Secondary | ICD-10-CM

## 2012-12-10 NOTE — Telephone Encounter (Signed)
Message copied by Brennan Bailey on Wed Dec 10, 2012 10:35 AM ------      Message from: Larry Sierras      Created: Mon Dec 08, 2012 11:12 AM      Regarding: F/U      Contact: [phone]       PT HAD MG 12-05-12/BCG/ABNL/RECC 6 MO F/U .  WOULD LIKE TO KNOW AFTER DR TOTH GETS REPORT, WHAT WOULD HIS RECOMMENDATION BE?  PLEASE CALL HER/GM ------

## 2012-12-10 NOTE — Telephone Encounter (Signed)
LMOM to call back. Dr Carolynne Edouard reviewed MM/US results. He recommends biopsy of area. If patient wants to go ahead with biopsy, the orders are in computer and just need to be given to [facility] for scheduling. If patient decides to just follow up with MM in 6 months then the breast center can schedule that.

## 2012-12-11 ENCOUNTER — Telehealth (INDEPENDENT_AMBULATORY_CARE_PROVIDER_SITE_OTHER): Payer: Self-pay | Admitting: General Surgery

## 2012-12-11 NOTE — Telephone Encounter (Signed)
Left message for patient  Makayla [name] at breast center 12/18/12 @10 :15

## 2012-12-11 NOTE — Telephone Encounter (Signed)
Patient called back and wants to proceed with biopsy.

## 2012-12-18 ENCOUNTER — Ambulatory Visit
Admission: RE | Admit: 2012-12-18 | Discharge: 2012-12-18 | Disposition: A | Discharge status: Home / Self Care | Payer: PRIVATE HEALTH INSURANCE | Source: Ambulatory Visit | Attending: General Surgery | Admitting: General Surgery

## 2012-12-18 ENCOUNTER — Other Ambulatory Visit (INDEPENDENT_AMBULATORY_CARE_PROVIDER_SITE_OTHER): Payer: Self-pay | Admitting: General Surgery

## 2012-12-18 DIAGNOSIS — D4862 Neoplasm of uncertain behavior of left breast: Secondary | ICD-10-CM

## 2013-02-03 ENCOUNTER — Encounter: Payer: Self-pay | Admitting: Family Medicine

## 2013-02-03 ENCOUNTER — Ambulatory Visit (INDEPENDENT_AMBULATORY_CARE_PROVIDER_SITE_OTHER): Payer: PRIVATE HEALTH INSURANCE | Admitting: Family Medicine

## 2013-02-03 VITALS — BP 124/72 | HR 78 | Temp 98.1°F | Ht 64.25 in | Wt 173.8 lb

## 2013-02-03 DIAGNOSIS — N951 Menopausal and female climacteric states: Secondary | ICD-10-CM | POA: Insufficient documentation

## 2013-02-03 DIAGNOSIS — F39 Unspecified mood [affective] disorder: Secondary | ICD-10-CM

## 2013-02-03 DIAGNOSIS — R4586 Emotional lability: Secondary | ICD-10-CM | POA: Insufficient documentation

## 2013-02-03 MED ORDER — ESCITALOPRAM OXALATE 10 MG PO TABS: 10.0000 mg | ORAL_TABLET | Freq: Every day | ORAL | Status: DC

## 2013-02-03 NOTE — Assessment & Plan Note (Signed)
Pt is having a rough time with mood changes and some fatigue - still in waves/ cycles (she still has ovaries) Disc lifestyle changes - re: diet and exercise  Trial of lexapro 10 mg daily  F/u 4-6 wk

## 2013-02-03 NOTE — Patient Instructions (Addendum)
lexapro- take 1 ten mg pill daily  If side effects like depression or suicidal thoughts- stop it and call  For other concerns -let me know  If helpful - we will likely increase the dose to 20  Follow up with me in 4-6 weeks Try to exercise 5 days per week

## 2013-02-03 NOTE — Progress Notes (Signed)
Subjective:    Patient ID: Makayla [name], female    DOB: November 19, 1960, 52 y.o.   MRN: [id number]  HPI Here with menopausal issues and mood decline   Partial hysterectomy ? 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 Breast cancer 01/10/2012  . Abnormal mammogram 11/19/2011  . Other screening mammogram 10/25/2011  . Routine general medical examination at a health care facility 10/16/2011  . MENOPAUSAL SYNDROME 03/06/2010  . HYPERLIPIDEMIA 12/04/2007  . GERD 12/04/2007  . ELEVATED BLOOD PRESSURE WITHOUT DIAGNOSIS OF HYPERTENSION 12/04/2007   Past Medical History  Diagnosis Date  . GERD (gastroesophageal reflux disease)   . Hyperlipidemia     no current med.  . Mass of breast, left 01/2012  . Dental crowns present     x 4  . 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 Bursitis, trochanteric     bi/lat   . Hypertension     white coat syndrome,  . Obesity    Past Surgical History  Procedure Laterality Date  . Tubal ligation    . Robotic assisted laparoscopic sacrocolpopexy  06/14/2011    Procedure: ROBOTIC ASSISTED LAPAROSCOPIC SACROCOLPOPEXY;  Surgeon: Crecencio Mc, MD;  Location: [facility];  Service: Urology;  Laterality: N/A;  . Rectocele repair  06/14/2011    Procedure: POSTERIOR REPAIR (RECTOCELE);   Surgeon: Crecencio Mc, MD;  Location: [facility];  Service: Urology;  Laterality: N/A;  cystoscopy, SPARC sling  . Abdominal hysterectomy      partial  . Breast surgery  01/10/12    lumpectomy - left   History  Substance Use Topics  . Smoking status: Never Smoker   . Smokeless tobacco: Never Used  . Alcohol Use: No   Family History  Problem Relation Age of Onset  . Hypertension Mother   . Hyperlipidemia Mother   . Diabetes Mother   . Cancer Mother     uterine  . GER disease Father   . Depression Sister   . Heart disease Brother   . Heart attack Maternal Grandmother   . Hypertension Maternal Grandmother    No Known Allergies Current Outpatient Prescriptions on File Prior to Visit  Medication Sig Dispense Refill  . calcium-vitamin D (OSCAL WITH D) 500-200 MG-UNIT per tablet Take 1 tablet by mouth daily.      . [DISCONTINUED] estradiol (VIVELLE-DOT) 0.025 MG/24HR Place 1 patch onto the skin 2 (two) times a week. Monday and friday       No current facility-administered medications on file prior to visit.     Review of Systems Review of Systems  Constitutional: Negative for fever, appetite change,  and unexpected weight change.  Eyes: Negative for pain and visual disturbance.  Respiratory: Negative for cough  and shortness of breath.   Cardiovascular: Negative for cp or palpitations    Gastrointestinal: Negative for nausea, diarrhea and constipation.  Genitourinary: Negative for urgency and frequency.  Skin: Negative for pallor or rash   Neurological: Negative for weakness, light-headedness, numbness and headaches.  Hematological: Negative for adenopathy. Does not bruise/bleed easily.  Psychiatric/Behavioral: pos for intermittent dysphoric mood and irritability.         Objective:   Physical Exam  Constitutional: She appears well-developed and well-nourished. No distress.  obese and well appearing   HENT:  Head: Normocephalic and atraumatic.  Eyes: Conjunctivae and EOM are  normal. Pupils are equal, round, and reactive to light. No scleral icterus.  Neck: Normal range of motion. Neck supple. No JVD present. Carotid bruit is not present. No thyromegaly present.  Cardiovascular: Normal rate, regular rhythm, normal heart sounds and intact distal pulses.  Exam reveals no gallop.   Pulmonary/Chest: Effort normal and breath sounds normal. No respiratory distress. She has no wheezes.  Abdominal: Soft. Bowel sounds are normal. She exhibits no distension, no abdominal bruit and no mass. There is no tenderness.  Musculoskeletal: She exhibits no edema.  Lymphadenopathy:    She has no cervical adenopathy.  Neurological: She is alert. She has normal reflexes. She displays no tremor. No cranial nerve deficit. She exhibits normal muscle tone. Coordination normal.  Skin: Skin is warm and dry. No rash noted. No erythema. No pallor.  Psychiatric: Her behavior is normal. Thought content normal. Her mood appears not anxious. Her affect is blunt. Her affect is not labile and not inappropriate. Her speech is not delayed. Thought content is not paranoid. She expresses no homicidal and no suicidal ideation.  Pt seems fatigued and with slt blunted affect           Assessment & Plan:

## 2013-02-03 NOTE — Assessment & Plan Note (Signed)
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

## 2013-02-12 ENCOUNTER — Telehealth: Payer: Self-pay

## 2013-02-12 NOTE — Telephone Encounter (Signed)
Ask her what symptoms she had please

## 2013-02-12 NOTE — Telephone Encounter (Signed)
Pt left v/m ; pt was seen 02/03/13 and started on Lexapro for perimenopausal symptoms/ pt only took one pill but felt bad after taking and would like different med sent to [facility] [address]. Pt request cb on 02/13/13 at [phone].Please advise.

## 2013-02-13 MED ORDER — SERTRALINE HCL 25 MG PO TABS: 25.0000 mg | ORAL_TABLET | Freq: Every day | ORAL | Status: DC

## 2013-02-13 NOTE — Telephone Encounter (Signed)
Pt said she took one pill on Sunday morning and she had nausea, HA, felt anxious, it made her feel like her skin was "crawling", she didn't get any sleep Sunday night and if she fell asleep it was only for a few min then she would wake up really fast and jumpy like something scared her out of her sleep, pt only took 1 pill on Sunday and hasn't taken another one since, pt's side eff have resolved and she would like to know if there is something more mild you can prescribe her for her perimenopausal sxs, like Zoloft, please advise

## 2013-02-13 NOTE — Telephone Encounter (Signed)
Rx called in as prescribed, and pt notified and advise to update [place] of any side eff or if med not working

## 2013-02-13 NOTE — Telephone Encounter (Signed)
We can try low dose zoloft  Please call in 25 mg daily  If side eff or not helpful let me know

## 2013-03-11 ENCOUNTER — Encounter: Payer: Self-pay | Admitting: Family Medicine

## 2013-03-11 ENCOUNTER — Ambulatory Visit (INDEPENDENT_AMBULATORY_CARE_PROVIDER_SITE_OTHER): Payer: PRIVATE HEALTH INSURANCE | Admitting: Family Medicine

## 2013-03-11 VITALS — BP 136/82 | HR 68 | Temp 96.8°F | Ht 64.25 in | Wt 171.2 lb

## 2013-03-11 DIAGNOSIS — F39 Unspecified mood [affective] disorder: Secondary | ICD-10-CM

## 2013-03-11 DIAGNOSIS — R4586 Emotional lability: Secondary | ICD-10-CM

## 2013-03-11 DIAGNOSIS — N951 Menopausal and female climacteric states: Secondary | ICD-10-CM

## 2013-03-11 NOTE — Patient Instructions (Addendum)
Strongly consider getting counseling through employee health  Try the zoloft- give it a little longer - but if side effects are intolerable - stop it and let me know  Exercise / eat healthy - and take care of yourself

## 2013-03-11 NOTE — Progress Notes (Signed)
Subjective:    Patient ID: Makayla [name], female    DOB: May 31, 1961, 52 y.o.   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 Perimenopause 02/03/2013  . Changeable mood 02/03/2013  . 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 Breast cancer 01/10/2012  . Abnormal mammogram 11/19/2011  . Other screening mammogram 10/25/2011  . Routine general medical examination at a health care facility 10/16/2011  . MENOPAUSAL SYNDROME 03/06/2010  . HYPERLIPIDEMIA 12/04/2007  . GERD 12/04/2007  . ELEVATED BLOOD PRESSURE WITHOUT DIAGNOSIS OF HYPERTENSION 12/04/2007   Past Medical History  Diagnosis Date  . GERD (gastroesophageal reflux disease)   . Hyperlipidemia     no current med.  . Mass of breast, left 01/2012  . Dental crowns present     x 4  . 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 Bursitis, trochanteric     bi/lat   . Hypertension     white coat syndrome,  . Obesity    Past Surgical History  Procedure Laterality Date  . Tubal ligation    . Robotic assisted laparoscopic sacrocolpopexy  06/14/2011    Procedure:  ROBOTIC ASSISTED LAPAROSCOPIC SACROCOLPOPEXY;  Surgeon: Crecencio Mc, MD;  Location: [facility];  Service: Urology;  Laterality: N/A;  . Rectocele repair  06/14/2011    Procedure: POSTERIOR REPAIR (RECTOCELE);  Surgeon: Crecencio Mc, MD;  Location: [facility];  Service: Urology;  Laterality: N/A;  cystoscopy, SPARC sling  . Abdominal hysterectomy      partial  . Breast surgery  01/10/12    lumpectomy - left   History  Substance Use Topics  . Smoking status: Never Smoker   . Smokeless tobacco: Never Used  . Alcohol Use: No   Family History  Problem Relation Age of Onset  . Hypertension Mother   . Hyperlipidemia Mother   . Diabetes Mother   . Cancer Mother     uterine  . GER disease Father   . Depression Sister   . Heart disease Brother   . Heart attack Maternal Grandmother   . Hypertension Maternal Grandmother    Allergies  Allergen Reactions  . Lexapro (Escitalopram Oxalate)     Felt funny/ sleepless and anxious   Current Outpatient Prescriptions on File Prior to Visit  Medication Sig Dispense Refill  . b complex vitamins tablet Take 1 tablet by mouth daily.      . calcium-vitamin D (OSCAL WITH D) 500-200 MG-UNIT per tablet Take 1 tablet by mouth daily.      Marland Kitchen esomeprazole (NEXIUM)  40 MG capsule Take 40 mg by mouth daily.       Marland Kitchen VITAMIN D, CHOLECALCIFEROL, PO Take 1 tablet by mouth daily.      . [DISCONTINUED] estradiol (VIVELLE-DOT) 0.025 MG/24HR Place 1 patch onto the skin 2 (two) times a week. Monday and friday       No current facility-administered medications on file prior to visit.    Review of Systems Review of Systems  Constitutional: Negative for fever, appetite change, fatigue and unexpected weight change.  Eyes: Negative for pain and visual disturbance.  Respiratory: Negative for cough and shortness of breath.   Cardiovascular: Negative for cp or palpitations    Gastrointestinal: Negative for nausea, diarrhea and constipation.  Genitourinary: Negative for urgency and  frequency.  Skin: Negative for pallor or rash   Neurological: Negative for weakness, light-headedness, numbness and headaches.  Hematological: Negative for adenopathy. Does not bruise/bleed easily.  Psychiatric/Behavioral: Negative for dysphoric mood. Pos for anx/ irritability and stressors          Objective:   Physical Exam  Constitutional: She appears well-developed and well-nourished. No distress.  HENT:  Head: Normocephalic and atraumatic.  Eyes: Conjunctivae and EOM are normal. Pupils are equal, round, and reactive to light.  Neck: Normal range of motion. Neck supple. No thyromegaly present.  Neurological: She is alert. She displays no tremor.  Skin: Skin is warm and dry. No erythema. No pallor.  Psychiatric: Her speech is normal and behavior is normal. Thought content normal. Her mood appears anxious. Thought content is not paranoid. Cognition and memory are normal. She does not exhibit a depressed mood. She expresses no homicidal and no suicidal ideation.          Assessment & Plan:

## 2013-03-12 NOTE — Assessment & Plan Note (Signed)
Moodiness related to menopause and also stressors  Disc stressors and coping mechanisms in detail today Rev zoloft- poss side eff and expectations  Urged her to try it again  Also to seek counseling through employee health

## 2013-03-12 NOTE — Assessment & Plan Note (Signed)
With primarily mood symptoms  Urged to try zoloft again after side eff with lexapro Disc imp of exercise and self care

## 2013-05-16 ENCOUNTER — Other Ambulatory Visit: Payer: Self-pay | Admitting: Family Medicine

## 2013-06-03 ENCOUNTER — Other Ambulatory Visit: Payer: Self-pay | Admitting: Family Medicine

## 2013-06-03 NOTE — Telephone Encounter (Signed)
Last office visit 03/11/2013.  Ibuprofen not listed on medication list.  Ok to refill?

## 2013-06-03 NOTE — Telephone Encounter (Signed)
Since it is not on her med list-please check with her ie: what she is taking it for --thanks

## 2013-06-04 NOTE — Telephone Encounter (Signed)
Pt takes it for her right hip pain, pt said she doesn't take if often but likes to keep it on hand incase it starts bothering her, Rx not on current med list but it is on her full med list as being prescribed in the past, please advise

## 2013-06-05 NOTE — Telephone Encounter (Signed)
Pt left v/m requesting status of ibuprofen refill.

## 2013-06-07 NOTE — Telephone Encounter (Signed)
I sent it to her pharmacy

## 2013-06-11 ENCOUNTER — Other Ambulatory Visit: Payer: Self-pay

## 2013-07-16 ENCOUNTER — Ambulatory Visit (INDEPENDENT_AMBULATORY_CARE_PROVIDER_SITE_OTHER): Payer: PRIVATE HEALTH INSURANCE | Admitting: General Surgery

## 2013-07-16 ENCOUNTER — Encounter (INDEPENDENT_AMBULATORY_CARE_PROVIDER_SITE_OTHER): Payer: Self-pay | Admitting: General Surgery

## 2013-07-16 VITALS — BP 108/70 | HR 61 | Temp 97.1°F | Resp 16 | Ht 65.0 in | Wt 169.8 lb

## 2013-07-16 DIAGNOSIS — D4860 Neoplasm of uncertain behavior of unspecified breast: Secondary | ICD-10-CM

## 2013-07-16 DIAGNOSIS — D4862 Neoplasm of uncertain behavior of left breast: Secondary | ICD-10-CM

## 2013-07-16 NOTE — Progress Notes (Signed)
Subjective:     Patient ID: Makayla [name], female   DOB: 1961-05-09, 52 y.o.   MRN: [id number]  HPI The patient is a 52 year old white female who is 1-1/2 years status post left breast lumpectomy for an intermediate grade phylloides tumor. She had a biopsy of the lesion in the left breast about 6 months ago which came back as benign fat necrosis. She is otherwise doing well and has no complaints. She does occasionally get all sharp pain that runs to the breast.  Review of Systems  Constitutional: Negative.   HENT: Negative.   Eyes: Negative.   Respiratory: Negative.   Cardiovascular: Negative.   Gastrointestinal: Negative.   Endocrine: Negative.   Genitourinary: Negative.   Musculoskeletal: Negative.   Skin: Negative.   Allergic/Immunologic: Negative.   Neurological: Negative.   Hematological: Negative.   Psychiatric/Behavioral: Negative.        Objective:   Physical Exam  Constitutional: She is oriented to person, place, and time. She appears well-developed and well-nourished.  HENT:  Head: Normocephalic and atraumatic.  Eyes: Conjunctivae and EOM are normal. Pupils are equal, round, and reactive to light.  Neck: Normal range of motion. Neck supple.  Cardiovascular: Normal rate, regular rhythm and normal heart sounds.   Pulmonary/Chest: Effort normal and breath sounds normal.  Her left breast incision has healed nicely. There is no palpable mass in either breast. There is no palpable axillary, supraclavicular, or cervical lymphadenopathy  Abdominal: Soft. Bowel sounds are normal.  Musculoskeletal: Normal range of motion.  Lymphadenopathy:    She has no cervical adenopathy.  Neurological: She is alert and oriented to person, place, and time.  Skin: Skin is warm and dry.  Psychiatric: She has a normal mood and affect. Her behavior is normal.       Assessment:     The patient is one half years status post left breast lumpectomy for a phylloides tumor     Plan:     At  this point she will continue to do regular self exams. She is due for a 6 month followup mammogram which she will schedule soon. Otherwise I will see her back in about 6 months

## 2013-07-16 NOTE — Patient Instructions (Signed)
Continue regular self exams  

## 2013-11-17 ENCOUNTER — Other Ambulatory Visit: Payer: Self-pay

## 2013-11-17 DIAGNOSIS — Z1231 Encounter for screening mammogram for malignant neoplasm of breast: Secondary | ICD-10-CM

## 2013-11-19 ENCOUNTER — Other Ambulatory Visit: Payer: Self-pay

## 2013-11-19 MED ORDER — NEXIUM 40 MG PO CPDR: DELAYED_RELEASE_CAPSULE | ORAL | Status: DC

## 2013-11-19 NOTE — Telephone Encounter (Signed)
I will re send to [facility] as DAW

## 2013-11-19 NOTE — Telephone Encounter (Signed)
Pt left v/m; pt said pharmacy changed Nexium to generic and since changed to generic pt has problem with stomach burning. Pt wants to know if can get name brand Nexium again. [place]

## 2013-11-20 NOTE — Telephone Encounter (Signed)
Pt notified Nexium sent to pharmacy

## 2013-12-07 ENCOUNTER — Ambulatory Visit
Admission: RE | Admit: 2013-12-07 | Discharge: 2013-12-07 | Disposition: A | Discharge status: Home / Self Care | Payer: PRIVATE HEALTH INSURANCE | Source: Ambulatory Visit

## 2013-12-07 DIAGNOSIS — Z1231 Encounter for screening mammogram for malignant neoplasm of breast: Secondary | ICD-10-CM

## 2014-01-19 ENCOUNTER — Encounter (INDEPENDENT_AMBULATORY_CARE_PROVIDER_SITE_OTHER): Payer: Self-pay | Admitting: General Surgery

## 2014-01-19 ENCOUNTER — Ambulatory Visit (INDEPENDENT_AMBULATORY_CARE_PROVIDER_SITE_OTHER): Payer: No Typology Code available for payment source | Admitting: General Surgery

## 2014-01-19 VITALS — BP 126/80 | HR 67 | Temp 97.5°F | Ht 65.0 in | Wt 172.0 lb

## 2014-01-19 DIAGNOSIS — R1011 Right upper quadrant pain: Secondary | ICD-10-CM

## 2014-01-19 DIAGNOSIS — D4860 Neoplasm of uncertain behavior of unspecified breast: Secondary | ICD-10-CM

## 2014-01-19 NOTE — Patient Instructions (Signed)
Continue regular self exams Will get U/S of abd

## 2014-01-19 NOTE — Progress Notes (Signed)
Subjective:     Patient ID: Makayla [name], female   DOB: 1961-02-23, 53 y.o.   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 She denies any breast pain. She did have a recent mammogram that showed no evidence of malignancy. Her only complaint is of some intermittent right upper quadrant pain. The pain has been associated with nausea but no vomiting. It first occurred about 3 months ago and tends to come and go.  Review of Systems  Constitutional: Negative.   HENT: Negative.   Eyes: Negative.   Respiratory: Negative.   Cardiovascular: Negative.   Gastrointestinal: Positive for nausea and abdominal pain.  Endocrine: Negative.   Genitourinary: Negative.   Musculoskeletal: Negative.   Skin: Negative.   Allergic/Immunologic: Negative.   Neurological: Negative.   Hematological: Negative.   Psychiatric/Behavioral: Negative.        Objective:   Physical Exam  Constitutional: She is oriented to person, place, and time. She appears well-developed and well-nourished.  HENT:  Head: Normocephalic and atraumatic.  Eyes: Conjunctivae and EOM are normal. Pupils are equal, round, and reactive to light.  Neck: Normal range of motion. Neck supple.  Cardiovascular: Normal rate, regular rhythm and normal heart sounds.   Pulmonary/Chest: Effort normal and breath sounds normal.  There is no palpable mass in either breast. There is no palpable axillary, supraclavicular, or cervical lymphadenopathy  Abdominal: Soft. Bowel sounds are normal.  There is mild right upper quadrant pain. There is no palpable mass.  Musculoskeletal: Normal range of motion.  Lymphadenopathy:    She has no cervical adenopathy.  Neurological: She is alert and oriented to person, place, and time.  Skin: Skin is warm and dry.  Psychiatric: She has a normal mood and affect. Her behavior is normal.       Assessment:    The patient is 2 years status post left breast lumpectomy for a Phylloides tumor. From a breast standpoint she is doing well. She has a new right upper quadrant pain. For this I will plan to get an ultrasound to look for gallstones     Plan:     Plan for right upper quadrant ultrasound. I will call her with the results of the study. Otherwise she will continue to do regular self exams. I will plan to see her back in about 6 months.

## 2014-01-20 ENCOUNTER — Telehealth (INDEPENDENT_AMBULATORY_CARE_PROVIDER_SITE_OTHER): Payer: Self-pay | Admitting: *Deleted

## 2014-01-20 NOTE — Telephone Encounter (Signed)
LM for pt to return my call regarding her [place] Abd.  Please advise her it is scheduled for 01-22-14, [place], [address]  Arriving at 8:15 for 8:30 appt.  Also advise pt NPO after midnight the night before.  Thanks!  Anderson Malta

## 2014-01-22 ENCOUNTER — Telehealth (INDEPENDENT_AMBULATORY_CARE_PROVIDER_SITE_OTHER): Payer: Self-pay

## 2014-01-22 ENCOUNTER — Ambulatory Visit
Admission: RE | Admit: 2014-01-22 | Discharge: 2014-01-22 | Disposition: A | Discharge status: Home / Self Care | Payer: No Typology Code available for payment source | Source: Ambulatory Visit | Attending: General Surgery | Admitting: General Surgery

## 2014-01-22 DIAGNOSIS — R1011 Right upper quadrant pain: Secondary | ICD-10-CM

## 2014-01-22 NOTE — Telephone Encounter (Signed)
Message copied by Carlene Coria on Fri Jan 22, 2014  3:20 PM ------      Message from: Luella Cook III      Created: Fri Jan 22, 2014 12:55 PM       She does have gallstones. I can talk to her about removing gallbladder if she is interested. ------

## 2014-01-22 NOTE — Telephone Encounter (Signed)
Called pt with results. She will discuss with her husband and call back to make appointment if and when she decides to have sx.

## 2014-02-02 ENCOUNTER — Ambulatory Visit (INDEPENDENT_AMBULATORY_CARE_PROVIDER_SITE_OTHER): Payer: No Typology Code available for payment source | Admitting: General Surgery

## 2014-02-02 ENCOUNTER — Encounter (INDEPENDENT_AMBULATORY_CARE_PROVIDER_SITE_OTHER): Payer: Self-pay | Admitting: General Surgery

## 2014-02-02 VITALS — BP 118/80 | HR 68 | Temp 97.0°F | Resp 16 | Ht 65.0 in | Wt 172.0 lb

## 2014-02-02 DIAGNOSIS — K8020 Calculus of gallbladder without cholecystitis without obstruction: Secondary | ICD-10-CM | POA: Insufficient documentation

## 2014-02-02 NOTE — Patient Instructions (Signed)
Call if you want to schedule lap chole

## 2014-02-17 NOTE — Progress Notes (Signed)
Subjective:     Patient ID: Makayla [name], female   DOB: Sep 07, 1960, 53 y.o.   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 At her recent breast cancer followup appointment she noted right upper quadrant pain that was intermittent. The pain has been associated with some nausea but no vomiting. The pain tends to come and go depending on what types of food she's eating. To work this up we got an ultrasound of her abdomen which did show stones in her gallbladder but no gallbladder wall thickening or ductal dilatation.  Review of Systems  Constitutional: Negative.   HENT: Negative.   Eyes: Negative.   Respiratory: Negative.   Cardiovascular: Negative.   Gastrointestinal: Positive for nausea and abdominal pain.  Endocrine: Negative.   Genitourinary: Negative.   Musculoskeletal: Negative.   Skin: Negative.   Allergic/Immunologic: Negative.   Neurological: Negative.   Hematological: Negative.   Psychiatric/Behavioral: Negative.        Objective:   Physical Exam  Constitutional: She is oriented to person, place, and time. She appears well-developed and well-nourished.  HENT:  Head: Normocephalic and atraumatic.  Eyes: Conjunctivae and EOM are normal. Pupils are equal, round, and reactive to light.  Neck: Normal range of motion. Neck supple.  Cardiovascular: Normal rate, regular rhythm and normal heart sounds.   Pulmonary/Chest: Effort normal and breath sounds normal.  Abdominal: Soft. Bowel sounds are normal. There is no tenderness.  Musculoskeletal: Normal range of motion.  Lymphadenopathy:    She has no cervical adenopathy.  Neurological: She is alert and oriented to person, place, and time.  Skin: Skin is warm and dry.  Psychiatric: She has a normal mood and affect. Her behavior is normal.       Assessment:     The patient is 2 years status post left breast  lumpectomy for a Phylloides tumor And now seems to have symptomatic gallstones. I've discussed with her in detail the risks and benefits of the operation to remove the gallbladder as well as some of the technical aspects and she understands. She does not want to have surgery at this point but she will consider it.    Plan:     At this point she will continue to do regular self exams. I will plan to see her back from a breast standpoint in about 6 months. She will call back if she would like to have her gallbladder removed.

## 2014-03-08 ENCOUNTER — Other Ambulatory Visit: Payer: Self-pay | Admitting: Family Medicine

## 2014-03-08 NOTE — Telephone Encounter (Signed)
Received refill request electronically from pharmacy. Last refill 06/07/13 #30, last office visit 03/21/13, appointment scheduled 03/11/13. Is it okay to refill medication>

## 2014-03-08 NOTE — Telephone Encounter (Signed)
done

## 2014-03-08 NOTE — Telephone Encounter (Signed)
Please refill times one  

## 2014-05-10 ENCOUNTER — Telehealth: Payer: Self-pay | Admitting: Family Medicine

## 2014-05-10 DIAGNOSIS — Z0000 Encounter for general adult medical examination without abnormal findings: Secondary | ICD-10-CM

## 2014-05-10 NOTE — Telephone Encounter (Signed)
Message copied by Abner Greenspan on Mon May 10, 2014  9:20 PM ------      Message from: Ellamae Sia      Created: Wed May 05, 2014  5:31 PM      Regarding: Lab orders for Tuesday, 10.6.15       Patient is scheduled for CPX labs, please order future labs, Thanks , Terri       ------

## 2014-05-11 ENCOUNTER — Other Ambulatory Visit (INDEPENDENT_AMBULATORY_CARE_PROVIDER_SITE_OTHER): Payer: No Typology Code available for payment source

## 2014-05-11 DIAGNOSIS — E785 Hyperlipidemia, unspecified: Secondary | ICD-10-CM

## 2014-05-11 DIAGNOSIS — Z0000 Encounter for general adult medical examination without abnormal findings: Secondary | ICD-10-CM

## 2014-05-11 DIAGNOSIS — R030 Elevated blood-pressure reading, without diagnosis of hypertension: Secondary | ICD-10-CM

## 2014-05-11 LAB — CBC WITH DIFFERENTIAL/PLATELET
Basophils Absolute: 0.1 10*3/uL (ref 0.0–0.1)
Basophils Relative: 0.9 % (ref 0.0–3.0)
EOS PCT: 1.7 % (ref 0.0–5.0)
Eosinophils Absolute: 0.1 10*3/uL (ref 0.0–0.7)
HEMATOCRIT: 42.1 % (ref 36.0–46.0)
Hemoglobin: 13.9 g/dL (ref 12.0–15.0)
Lymphocytes Relative: 33.2 % (ref 12.0–46.0)
Lymphs Abs: 2.2 10*3/uL (ref 0.7–4.0)
MCHC: 33.2 g/dL (ref 30.0–36.0)
MCV: 86.2 fl (ref 78.0–100.0)
MONOS PCT: 7.1 % (ref 3.0–12.0)
Monocytes Absolute: 0.5 10*3/uL (ref 0.1–1.0)
Neutro Abs: 3.8 10*3/uL (ref 1.4–7.7)
Neutrophils Relative %: 57.1 % (ref 43.0–77.0)
Platelets: 177 10*3/uL (ref 150.0–400.0)
RBC: 4.88 Mil/uL (ref 3.87–5.11)
RDW: 13.4 % (ref 11.5–15.5)
WBC: 6.7 10*3/uL (ref 4.0–10.5)

## 2014-05-12 LAB — TSH: TSH: 2.62 u[IU]/mL (ref 0.35–4.50)

## 2014-05-12 LAB — COMPREHENSIVE METABOLIC PANEL
ALBUMIN: 4.2 g/dL (ref 3.5–5.2)
ALK PHOS: 63 U/L (ref 39–117)
ALT: 10 U/L (ref 0–35)
AST: 14 U/L (ref 0–37)
BUN: 12 mg/dL (ref 6–23)
CALCIUM: 9.5 mg/dL (ref 8.4–10.5)
CHLORIDE: 107 meq/L (ref 96–112)
CO2: 23 meq/L (ref 19–32)
Creatinine, Ser: 0.8 mg/dL (ref 0.4–1.2)
GFR: 81.76 mL/min (ref >60.00)
GLUCOSE: 83 mg/dL (ref 70–99)
POTASSIUM: 4.5 meq/L (ref 3.5–5.1)
Sodium: 140 mEq/L (ref 135–145)
Total Bilirubin: 0.5 mg/dL (ref 0.2–1.2)
Total Protein: 7.2 g/dL (ref 6.0–8.3)

## 2014-05-12 LAB — LIPID PANEL
CHOLESTEROL: 198 mg/dL (ref 0–200)
HDL: 54.9 mg/dL (ref >39.00)
LDL Cholesterol: 126 mg/dL — ABNORMAL HIGH (ref 0–99)
NonHDL: 143.1
Total CHOL/HDL Ratio: 4
Triglycerides: 85 mg/dL (ref 0.0–149.0)
VLDL: 17 mg/dL (ref 0.0–40.0)

## 2014-05-17 ENCOUNTER — Encounter: Payer: PRIVATE HEALTH INSURANCE | Admitting: Family Medicine

## 2014-05-18 ENCOUNTER — Ambulatory Visit (INDEPENDENT_AMBULATORY_CARE_PROVIDER_SITE_OTHER): Payer: No Typology Code available for payment source | Admitting: Family Medicine

## 2014-05-18 ENCOUNTER — Encounter: Payer: Self-pay | Admitting: Family Medicine

## 2014-05-18 VITALS — BP 110/80 | HR 67 | Temp 97.4°F | Ht 64.0 in | Wt 173.5 lb

## 2014-05-18 DIAGNOSIS — Z23 Encounter for immunization: Secondary | ICD-10-CM

## 2014-05-18 DIAGNOSIS — E785 Hyperlipidemia, unspecified: Secondary | ICD-10-CM

## 2014-05-18 DIAGNOSIS — K8020 Calculus of gallbladder without cholecystitis without obstruction: Secondary | ICD-10-CM

## 2014-05-18 DIAGNOSIS — Z0000 Encounter for general adult medical examination without abnormal findings: Secondary | ICD-10-CM

## 2014-05-18 MED ORDER — NEXIUM 40 MG PO CPDR: DELAYED_RELEASE_CAPSULE | ORAL | Status: DC

## 2014-05-18 NOTE — Assessment & Plan Note (Signed)
occ symptoms  She will likely have to have ccy Adv very low fat diet

## 2014-05-18 NOTE — Patient Instructions (Signed)
Flu shot today  Take care of yourself Eat a healthy diet and exercise regularly

## 2014-05-18 NOTE — Assessment & Plan Note (Signed)
Lipids are at goal but LDL has gone up Rev low sat fat diet  Will continue to follow

## 2014-05-18 NOTE — Progress Notes (Signed)
Subjective:    Patient ID: Makayla [name], female    DOB: 05/05/1961, 53 y.o.   MRN: [id number]  HPI Here for health maintenance exam and to review chronic medical problems    Working a lot  Feeling pretty good overall - in less of a fog than she used to be   Wt is down 1 lb with bmi of 32  Obese  She is eating a healthy diet -made a real effort  Exercise fell off after the very hot weather -now she is walking 3 d per week - needs to increase that    BP Readings from Last 3 Encounters:  05/18/14 124/90  02/02/14 118/80  01/19/14 126/80   hx of some whitecoat HTN in the past    Declines flu shot in the past -wants to get one this year (will be mandatory at work)  Mammogram 5/15 nl  Self exam- nothing new - she will have f/u with Dr Marlou Starks  Past hx of breast cancer   Had a hysterectomy  No longer has a cervix  No gyn problems   She does have gallstones -overall not a problem-she will watch for symptoms   Td 9/08   Colonoscopy 9/11 - nl   Hyperlipidemia  Lab Results  Component Value Date   CHOL 198 05/11/2014   CHOL 173 12/01/2012   CHOL 181 01/25/2012   Lab Results  Component Value Date   HDL 54.90 05/11/2014   HDL 45.70 12/01/2012   HDL 44.30 01/25/2012   Lab Results  Component Value Date   LDLCALC 126* 05/11/2014   LDLCALC 113* 12/01/2012   LDLCALC 116* 01/25/2012   Lab Results  Component Value Date   TRIG 85.0 05/11/2014   TRIG 74.0 12/01/2012   TRIG 105.0 01/25/2012   Lab Results  Component Value Date   CHOLHDL 4 05/11/2014   CHOLHDL 4 12/01/2012   CHOLHDL 4 01/25/2012   No results found for this basename: LDLDIRECT   diet controlled  HDL up with walking  She really watches her diet -no red meat  Does eat PB for protein    Other labs are ok -rev with pt   Patient Active Problem List   Diagnosis Date Noted  . Gallstones 02/02/2014  . RUQ pain 01/19/2014  . Perimenopause 02/03/2013  . Changeable mood 02/03/2013  . 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 Breast cancer 01/10/2012  . Abnormal mammogram 11/19/2011  . Other screening mammogram 10/25/2011  . Routine general medical examination at a health care facility 10/16/2011  . MENOPAUSAL SYNDROME 03/06/2010  . Hyperlipidemia LDL goal <130 12/04/2007  . GERD 12/04/2007  . ELEVATED BLOOD PRESSURE WITHOUT DIAGNOSIS OF HYPERTENSION 12/04/2007   Past Medical History  Diagnosis Date  . GERD (gastroesophageal reflux disease)   . Hyperlipidemia     no current med.  . Mass of breast, left 01/2012  . Dental crowns present     x 4  . 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 Bursitis, trochanteric     bi/lat   . Hypertension     white coat syndrome,  . Obesity    Past Surgical History  Procedure Laterality Date  . Tubal ligation    . Robotic assisted laparoscopic sacrocolpopexy  06/14/2011    Procedure: ROBOTIC ASSISTED LAPAROSCOPIC SACROCOLPOPEXY;  Surgeon: Dutch Gray, MD;  Location: [facility];  Service: Urology;  Laterality: N/A;  . Rectocele repair  06/14/2011    Procedure: POSTERIOR REPAIR (RECTOCELE);  Surgeon: Dutch Gray, MD;  Location: [facility];  Service: Urology;  Laterality: N/A;  cystoscopy, SPARC sling  . Abdominal hysterectomy      partial  . Breast surgery  01/10/12    lumpectomy - left   History  Substance Use Topics  . Smoking status: Never Smoker   . Smokeless tobacco: Never Used  . Alcohol Use: No   Family History  Problem Relation Age of Onset  . Hypertension Mother   . Hyperlipidemia Mother   . Diabetes Mother   . Cancer Mother     uterine  . GER disease Father   . Depression Sister   . Heart disease Brother   . Heart attack Maternal Grandmother   . Hypertension Maternal Grandmother    Allergies  Allergen Reactions  . Lexapro [Escitalopram Oxalate]     Felt funny/ sleepless and anxious   Current Outpatient  Prescriptions on File Prior to Visit  Medication Sig Dispense Refill  . b complex vitamins tablet Take 1 tablet by mouth daily.      Marland Kitchen VITAMIN D, CHOLECALCIFEROL, PO Take 1 tablet by mouth daily.      Marland Kitchen ibuprofen (ADVIL,MOTRIN) 800 MG tablet TAKE ONE TABLET BY MOUTH EVERY 8 HOURS AS NEEDED FOR PAIN  30 tablet  0  . [DISCONTINUED] estradiol (VIVELLE-DOT) 0.025 MG/24HR Place 1 patch onto the skin 2 (two) times a week. Monday and friday       No current facility-administered medications on file prior to visit.    Review of Systems Review of Systems  Constitutional: Negative for fever, appetite change, fatigue and unexpected weight change.  Eyes: Negative for pain and visual disturbance.  Respiratory: Negative for cough and shortness of breath.   Cardiovascular: Negative for cp or palpitations    Gastrointestinal: Negative for nausea, diarrhea and constipation.  Genitourinary: Negative for urgency and frequency.  Skin: Negative for pallor or rash   Neurological: Negative for weakness, light-headedness, numbness and headaches.  Hematological: Negative for adenopathy. Does not bruise/bleed easily.  Psychiatric/Behavioral: Negative for dysphoric mood. The patient is not nervous/anxious.         Objective:   Physical Exam  Constitutional: She appears well-developed and well-nourished. No distress.  obese and well appearing   HENT:  Head: Normocephalic and atraumatic.  Right Ear: External ear normal.  Left Ear: External ear normal.  Mouth/Throat: Oropharynx is clear and moist.  Eyes: Conjunctivae and EOM are normal. Pupils are equal, round, and reactive to light. No scleral icterus.  Neck: Normal range of motion. Neck supple. No JVD present. Carotid bruit is not present. No thyromegaly present.  Cardiovascular: Normal rate, regular rhythm, normal heart sounds and intact distal pulses.  Exam reveals no gallop.   Pulmonary/Chest: Effort normal and breath sounds normal. No respiratory  distress. She has no wheezes. She exhibits no tenderness.  Abdominal: Soft. Bowel sounds are normal. She exhibits no distension, no abdominal bruit and no mass. There is no tenderness.  Genitourinary: No breast swelling, tenderness, discharge or bleeding.  Breast exam: No mass, nodules, thickening, tenderness, bulging, retraction, inflamation, nipple discharge or skin changes noted.  No axillary or clavicular LA.     Baseline scar on L breast from prev surgery  Musculoskeletal: Normal range of motion. She exhibits no edema and no tenderness.  Lymphadenopathy:    She has no cervical adenopathy.  Neurological: She is alert. She has normal reflexes.  No cranial nerve deficit. She exhibits normal muscle tone. Coordination normal.  Skin: Skin is warm and dry. No rash noted. No erythema. No pallor.  Psychiatric: She has a normal mood and affect.          Assessment & Plan:   Problem List Items Addressed This Visit     Digestive   Gallstones     occ symptoms  She will likely have to have ccy Adv very low fat diet       Other   Hyperlipidemia LDL goal <130 - Primary     Lipids are at goal but LDL has gone up Rev low sat fat diet  Will continue to follow     Routine general medical examination at a health care facility     Reviewed health habits including diet and exercise and skin cancer prevention Reviewed appropriate screening tests for age  Also reviewed health mt list, fam hx and immunization status , as well as social and family history   See HPI Labs reviewed Flu vaccine today     Other Visit Diagnoses   Need for prophylactic vaccination and inoculation against influenza        Relevant Orders       Flu Vaccine QUAD 36+ mos PF IM (Fluarix Quad PF) (Completed)

## 2014-05-18 NOTE — Progress Notes (Signed)
Pre visit review using our clinic review tool, if applicable. No additional management support is needed unless otherwise documented below in the visit note. 

## 2014-05-18 NOTE — Assessment & Plan Note (Signed)
Reviewed health habits including diet and exercise and skin cancer prevention Reviewed appropriate screening tests for age  Also reviewed health mt list, fam hx and immunization status , as well as social and family history   See HPI Labs reviewed Flu vaccine today

## 2014-06-02 ENCOUNTER — Telehealth: Payer: Self-pay | Admitting: Family Medicine

## 2014-06-02 NOTE — Telephone Encounter (Signed)
Pt called in this morning and said that her insurance is penalizing her by charging her a $170. For Nexium because it is a brand name. She did try the generic but it was not working and the insurance is supposed to fax [place] the forms to fill out so that they will cover the brand name at the same price as generic. She just wanted to let you know what that was about.

## 2014-08-30 ENCOUNTER — Telehealth: Payer: Self-pay | Admitting: *Deleted

## 2014-08-30 NOTE — Telephone Encounter (Signed)
Pt left message on machine to check the status of nexium prior auth. Pharmacy was suppose to fax over request. I called pt back and left message on machine for her to return call to office.

## 2014-08-31 NOTE — Telephone Encounter (Signed)
Pt returned call, and said it was a prior auth request from pharmacy due to new insurance. Per pt pharmacist said that the problem was likely from the sig to take the med twice daily, and if rx was changed to once daily it may go through. Pt states she usually only takes one tab daily anyway, unless she's having some abd discomfort. She is ok with changing rx to one tab daily. Is this ok?

## 2014-08-31 NOTE — Telephone Encounter (Signed)
Yes-please change px to once daily  Thanks

## 2014-09-01 MED ORDER — NEXIUM 40 MG PO CPDR: DELAYED_RELEASE_CAPSULE | ORAL | Status: DC

## 2014-09-01 NOTE — Telephone Encounter (Signed)
rx changed and sent electronically

## 2014-11-15 ENCOUNTER — Other Ambulatory Visit: Payer: Self-pay

## 2014-11-15 DIAGNOSIS — Z1231 Encounter for screening mammogram for malignant neoplasm of breast: Secondary | ICD-10-CM

## 2014-12-07 ENCOUNTER — Other Ambulatory Visit: Payer: Self-pay | Admitting: Family Medicine

## 2014-12-07 NOTE — Telephone Encounter (Signed)
Electronic refill request, last refilled on 03/08/14 #30 with 0 refills, please advise

## 2014-12-07 NOTE — Telephone Encounter (Signed)
Please refil times 3

## 2014-12-08 NOTE — Telephone Encounter (Signed)
done

## 2014-12-10 ENCOUNTER — Ambulatory Visit
Admission: RE | Admit: 2014-12-10 | Discharge: 2014-12-10 | Disposition: A | Discharge status: Home / Self Care | Payer: No Typology Code available for payment source | Source: Ambulatory Visit

## 2014-12-10 DIAGNOSIS — Z1231 Encounter for screening mammogram for malignant neoplasm of breast: Secondary | ICD-10-CM

## 2015-01-05 ENCOUNTER — Telehealth: Payer: Self-pay

## 2015-01-05 NOTE — Telephone Encounter (Signed)
Pt left v/m; presently pt taking Nexium 40 mg one daily. Pt received letter from ins co that Nexium is going to tier 3 cost. Pt wants to know if could get substitute med that would not be as expensive as a tier 3 med. Annual exam done 05/18/14. Spoke with pt and requested pt contact ins co to see what substitute med would be more affordable; pt will contact ins co and cb with possible substitute meds. [facility] [address]

## 2015-01-10 MED ORDER — PANTOPRAZOLE SODIUM 40 MG PO TBEC: 40.0000 mg | DELAYED_RELEASE_TABLET | ORAL | Status: DC

## 2015-01-10 NOTE — Telephone Encounter (Signed)
Pt notified of Dr. Tower's comments and Rx sent to pharmacy  

## 2015-01-10 NOTE — Telephone Encounter (Signed)
Pt said she spoke with ins co and was advised to get generic Nexium; pt has tried generic nexium and was not effective. Pt request Dr Glori Bickers to chose another substitute med and send to [facility] [address]. Pt request cb when done. Pt understands Dr Glori Bickers is out of office today.

## 2015-01-10 NOTE — Telephone Encounter (Signed)
I still do not know what med would be affordable besides generic nexium - so we can see if generic protonix is  Please call in protonix 40 (generic please) 1 po qd in am # 30 3 refills If not covered or not effective please let me know

## 2015-01-21 NOTE — Telephone Encounter (Addendum)
Pt left v/m; pantoprazole is not effective and pt having heartburn, sorethroat and throat sounds raspy. Pt request cb with what else can be done.

## 2015-01-21 NOTE — Telephone Encounter (Signed)
Try taking it twice daily over the weekend to see if that is more effective (I think she takes it once daily now)  If not helpful-then we should ref to GI for further eval

## 2015-01-21 NOTE — Telephone Encounter (Signed)
Left detailed message on voicemail of cell phone. 

## 2015-04-22 ENCOUNTER — Encounter: Payer: Self-pay | Admitting: Internal Medicine

## 2015-05-15 ENCOUNTER — Telehealth: Payer: Self-pay | Admitting: Family Medicine

## 2015-05-15 DIAGNOSIS — Z0000 Encounter for general adult medical examination without abnormal findings: Secondary | ICD-10-CM

## 2015-05-15 NOTE — Telephone Encounter (Signed)
-----   Message from Ellamae Sia sent at 05/13/2015 11:25 AM EDT ----- Regarding: Lab orders for Monday, 10.10.16 Patient is scheduled for CPX labs, please order future labs, Thanks , Karna Christmas

## 2015-05-16 ENCOUNTER — Other Ambulatory Visit (INDEPENDENT_AMBULATORY_CARE_PROVIDER_SITE_OTHER): Payer: No Typology Code available for payment source

## 2015-05-16 DIAGNOSIS — Z0000 Encounter for general adult medical examination without abnormal findings: Secondary | ICD-10-CM | POA: Diagnosis not present

## 2015-05-16 LAB — COMPREHENSIVE METABOLIC PANEL
ALBUMIN: 4 g/dL (ref 3.5–5.2)
ALT: 9 U/L (ref 0–35)
AST: 10 U/L (ref 0–37)
Alkaline Phosphatase: 64 U/L (ref 39–117)
BUN: 10 mg/dL (ref 6–23)
CO2: 29 mEq/L (ref 19–32)
Calcium: 9.5 mg/dL (ref 8.4–10.5)
Chloride: 105 mEq/L (ref 96–112)
Creatinine, Ser: 0.78 mg/dL (ref 0.40–1.20)
GFR: 81.46 mL/min (ref >60.00)
GLUCOSE: 93 mg/dL (ref 70–99)
POTASSIUM: 4.5 meq/L (ref 3.5–5.1)
SODIUM: 140 meq/L (ref 135–145)
TOTAL PROTEIN: 6.7 g/dL (ref 6.0–8.3)
Total Bilirubin: 0.4 mg/dL (ref 0.2–1.2)

## 2015-05-16 LAB — LIPID PANEL
CHOL/HDL RATIO: 3
Cholesterol: 192 mg/dL (ref 0–200)
HDL: 58.6 mg/dL (ref >39.00)
LDL CALC: 118 mg/dL — AB (ref 0–99)
NonHDL: 133.59
TRIGLYCERIDES: 78 mg/dL (ref 0.0–149.0)
VLDL: 15.6 mg/dL (ref 0.0–40.0)

## 2015-05-16 LAB — CBC WITH DIFFERENTIAL/PLATELET
BASOS ABS: 0 10*3/uL (ref 0.0–0.1)
Basophils Relative: 0.4 % (ref 0.0–3.0)
EOS PCT: 2.3 % (ref 0.0–5.0)
Eosinophils Absolute: 0.2 10*3/uL (ref 0.0–0.7)
HCT: 41.9 % (ref 36.0–46.0)
HEMOGLOBIN: 13.8 g/dL (ref 12.0–15.0)
Lymphocytes Relative: 25.4 % (ref 12.0–46.0)
Lymphs Abs: 2.1 10*3/uL (ref 0.7–4.0)
MCHC: 33 g/dL (ref 30.0–36.0)
MCV: 84.4 fl (ref 78.0–100.0)
MONO ABS: 0.6 10*3/uL (ref 0.1–1.0)
MONOS PCT: 7.2 % (ref 3.0–12.0)
Neutro Abs: 5.5 10*3/uL (ref 1.4–7.7)
Neutrophils Relative %: 64.7 % (ref 43.0–77.0)
Platelets: 158 10*3/uL (ref 150.0–400.0)
RBC: 4.96 Mil/uL (ref 3.87–5.11)
RDW: 14.3 % (ref 11.5–15.5)
WBC: 8.4 10*3/uL (ref 4.0–10.5)

## 2015-05-16 LAB — TSH: TSH: 2.96 u[IU]/mL (ref 0.35–4.50)

## 2015-05-20 ENCOUNTER — Encounter: Payer: Self-pay | Admitting: Family Medicine

## 2015-05-20 ENCOUNTER — Ambulatory Visit (INDEPENDENT_AMBULATORY_CARE_PROVIDER_SITE_OTHER): Payer: No Typology Code available for payment source | Admitting: Family Medicine

## 2015-05-20 VITALS — BP 124/76 | HR 68 | Temp 97.6°F | Ht 64.0 in | Wt 182.0 lb

## 2015-05-20 DIAGNOSIS — E559 Vitamin D deficiency, unspecified: Secondary | ICD-10-CM | POA: Diagnosis not present

## 2015-05-20 DIAGNOSIS — E669 Obesity, unspecified: Secondary | ICD-10-CM | POA: Diagnosis not present

## 2015-05-20 DIAGNOSIS — Z0000 Encounter for general adult medical examination without abnormal findings: Secondary | ICD-10-CM

## 2015-05-20 DIAGNOSIS — E785 Hyperlipidemia, unspecified: Secondary | ICD-10-CM

## 2015-05-20 MED ORDER — RANITIDINE HCL 300 MG PO TABS: 300.0000 mg | ORAL_TABLET | Freq: Two times a day (BID) | ORAL | Status: DC

## 2015-05-20 NOTE — Patient Instructions (Signed)
Continue the vitamin D  Take care of yourself  Keep working on weight loss - use myfitness pal and keep exercising

## 2015-05-20 NOTE — Progress Notes (Signed)
Pre visit review using our clinic review tool, if applicable. No additional management support is needed unless otherwise documented below in the visit note. 

## 2015-05-20 NOTE — Progress Notes (Signed)
Subjective:    Patient ID: Makayla [name], female    DOB: 14-Jan-1961, 54 y.o.   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  Self exam - no lumps  Check up with Dr Marlou Starks in July- on 1 year return schedule  Td 9/08  colononsc 9/11 - nl with 10 y f/u  BP Readings from Last 3 Encounters:  05/20/15 124/76  05/18/14 110/80  02/02/14 118/80    Cholesterol Lab Results  Component Value Date   CHOL 192 05/16/2015   CHOL 198 05/11/2014   CHOL 173 12/01/2012   Lab Results  Component Value Date   HDL 58.60 05/16/2015   HDL 54.90 05/11/2014   HDL 45.70 12/01/2012   Lab Results  Component Value Date   LDLCALC 118* 05/16/2015   LDLCALC 126* 05/11/2014   LDLCALC 113* 12/01/2012   Lab Results  Component Value Date   TRIG 78.0 05/16/2015   TRIG 85.0 05/11/2014   TRIG 74.0 12/01/2012   Lab Results  Component Value Date   CHOLHDL 3 05/16/2015   CHOLHDL 4 05/11/2014   CHOLHDL 4 12/01/2012   No results found for: LDLDIRECT Avoiding sugar and fat  Avoids fried foods and red meat and high fat cheese   Results for orders placed or performed in visit on 05/16/15  CBC with Differential/Platelet  Result Value Ref Range   WBC 8.4 4.0 - 10.5 K/uL   RBC 4.96 3.87 - 5.11 Mil/uL   Hemoglobin 13.8 12.0 - 15.0 g/dL   HCT 41.9 36.0 - 46.0 %   MCV 84.4 78.0 - 100.0 fl   MCHC 33.0 30.0 - 36.0 g/dL   RDW 14.3 11.5 - 15.5 %   Platelets 158.0 150.0 -  400.0 K/uL   Neutrophils Relative % 64.7 43.0 - 77.0 %   Lymphocytes Relative 25.4 12.0 - 46.0 %   Monocytes Relative 7.2 3.0 - 12.0 %   Eosinophils Relative 2.3 0.0 - 5.0 %   Basophils Relative 0.4 0.0 - 3.0 %   Neutro Abs 5.5 1.4 - 7.7 K/uL   Lymphs Abs 2.1 0.7 - 4.0 K/uL   Monocytes Absolute 0.6 0.1 - 1.0 K/uL   Eosinophils Absolute 0.2 0.0 - 0.7 K/uL   Basophils Absolute 0.0 0.0 - 0.1 K/uL  Comprehensive metabolic panel  Result Value Ref Range   Sodium 140 135 - 145 mEq/L   Potassium 4.5 3.5 - 5.1 mEq/L   Chloride 105 96 - 112 mEq/L   CO2 29 19 - 32 mEq/L   Glucose, Bld 93 70 - 99 mg/dL   BUN 10 6 - 23 mg/dL   Creatinine, Ser 0.78 0.40 - 1.20 mg/dL   Total Bilirubin 0.4 0.2 - 1.2 mg/dL   Alkaline Phosphatase 64 39 - 117 U/L   AST 10 0 -  37 U/L   ALT 9 0 - 35 U/L   Total Protein 6.7 6.0 - 8.3 g/dL   Albumin 4.0 3.5 - 5.2 g/dL   Calcium 9.5 8.4 - 10.5 mg/dL   GFR 81.46 >60.00 mL/min  Lipid panel  Result Value Ref Range   Cholesterol 192 0 - 200 mg/dL   Triglycerides 78.0 0.0 - 149.0 mg/dL   HDL 58.60 >39.00 mg/dL   VLDL 15.6 0.0 - 40.0 mg/dL   LDL Cholesterol 118 (H) 0 - 99 mg/dL   Total CHOL/HDL Ratio 3    NonHDL 133.59   TSH  Result Value Ref Range   TSH 2.96 0.35 - 4.50 uIU/mL    B12 at work was 347  A1C 5.6   Vit D 28.7- low  She takes 3000 iu daily now - feels better   Patient Active Problem List   Diagnosis Date Noted  . Vitamin D deficiency 05/20/2015  . Obesity 05/20/2015  . Gallstones 02/02/2014  . RUQ pain 01/19/2014  . Perimenopause 02/03/2013  . Changeable mood ([place]) 02/03/2013  . 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 Breast cancer ([place]) 01/10/2012  . Other screening mammogram 10/25/2011  . Routine general medical examination at a health care facility 10/16/2011  . MENOPAUSAL SYNDROME 03/06/2010  . Hyperlipidemia LDL goal <130 12/04/2007  . GERD 12/04/2007  . ELEVATED BLOOD PRESSURE  WITHOUT DIAGNOSIS OF HYPERTENSION 12/04/2007   Past Medical History  Diagnosis Date  . GERD (gastroesophageal reflux disease)   . Hyperlipidemia     no current med.  . Mass of breast, left 01/2012  . Dental crowns present     x 4  . Breast cancer ([place]) 01/10/12    Left breast lumpectomy=phyllodes tumor,boederline 3.1cm,margins neg.,for atypia or malignancy  . Bursitis, trochanteric     bi/lat   . Hypertension     white coat syndrome,  . Obesity    Past Surgical History  Procedure Laterality Date  . Tubal ligation    . Robotic assisted laparoscopic sacrocolpopexy  06/14/2011    Procedure: ROBOTIC ASSISTED LAPAROSCOPIC SACROCOLPOPEXY;  Surgeon: Dutch Gray, MD;  Location: [facility];  Service: Urology;  Laterality: N/A;  . Rectocele repair  06/14/2011    Procedure: POSTERIOR REPAIR (RECTOCELE);  Surgeon: Dutch Gray, MD;  Location: [facility];  Service: Urology;  Laterality: N/A;  cystoscopy, SPARC sling  . Abdominal hysterectomy      partial  . Breast surgery  01/10/12    lumpectomy - left   Social History  Substance Use Topics  . Smoking status: Never Smoker   . Smokeless tobacco: Never Used  . Alcohol Use: No   Family History  Problem Relation Age of Onset  . Hypertension Mother   . Hyperlipidemia Mother   . Diabetes Mother   . Cancer Mother     uterine  . GER disease Father   . Depression Sister   . Heart disease Brother   . Heart attack Maternal Grandmother   . Hypertension Maternal Grandmother    Allergies  Allergen Reactions  . Lexapro [Escitalopram Oxalate]     Felt funny/ sleepless and anxious   Current Outpatient Prescriptions on File Prior to Visit  Medication Sig Dispense Refill  . b complex vitamins tablet Take 1 tablet by mouth daily.    Marland Kitchen ibuprofen (ADVIL,MOTRIN) 800 MG tablet TAKE ONE TABLET BY MOUTH EVERY 8 HOURS AS NEEDED FOR PAIN 30 tablet 2  . VITAMIN D, CHOLECALCIFEROL,  PO Take 1 tablet by mouth daily.    . [DISCONTINUED] estradiol (VIVELLE-DOT) 0.025  MG/24HR Place 1 patch onto the skin 2 (two) times a week. Monday and friday     No current facility-administered medications on file prior to visit.    Review of Systems Review of Systems  Constitutional: Negative for fever, appetite change, fatigue and unexpected weight change.  Eyes: Negative for pain and visual disturbance.  Respiratory: Negative for cough and shortness of breath.   Cardiovascular: Negative for cp or palpitations    Gastrointestinal: Negative for nausea, diarrhea and constipation.  Genitourinary: Negative for urgency and frequency.  Skin: Negative for pallor or rash   Neurological: Negative for weakness, light-headedness, numbness and headaches.  Hematological: Negative for adenopathy. Does not bruise/bleed easily.  Psychiatric/Behavioral: Negative for dysphoric mood. The patient is not nervous/anxious.         Objective:   Physical Exam  Constitutional: She appears well-developed and well-nourished. No distress.  obese and well appearing   HENT:  Head: Normocephalic and atraumatic.  Right Ear: External ear normal.  Left Ear: External ear normal.  Mouth/Throat: Oropharynx is clear and moist.  Eyes: Conjunctivae and EOM are normal. Pupils are equal, round, and reactive to light. No scleral icterus.  Neck: Normal range of motion. Neck supple. No JVD present. Carotid bruit is not present. No thyromegaly present.  Cardiovascular: Normal rate, regular rhythm, normal heart sounds and intact distal pulses.  Exam reveals no gallop.   Pulmonary/Chest: Effort normal and breath sounds normal. No respiratory distress. She has no wheezes. She exhibits no tenderness.  Abdominal: Soft. Bowel sounds are normal. She exhibits no distension, no abdominal bruit and no mass. There is no tenderness.  Genitourinary: No breast swelling, tenderness, discharge or bleeding.  Breast exam: No mass, nodules, thickening, tenderness, bulging, retraction, inflamation, nipple discharge or skin  changes noted.  No axillary or clavicular LA.      Musculoskeletal: Normal range of motion. She exhibits no edema or tenderness.  Lymphadenopathy:    She has no cervical adenopathy.  Neurological: She is alert. She has normal reflexes. No cranial nerve deficit. She exhibits normal muscle tone. Coordination normal.  Skin: Skin is warm and dry. No rash noted. No erythema. No pallor.  Psychiatric: She has a normal mood and affect.          Assessment & Plan:   Problem List Items Addressed This Visit      Other   Hyperlipidemia LDL goal <130   Obesity    Discussed how this problem influences overall health and the risks it imposes  Reviewed plan for weight loss with lower calorie diet (via better food choices and also portion control or program like weight watchers) and exercise building up to or more than 30 minutes 5 days per week including some aerobic activity         Routine general medical examination at a health care facility - Primary    Reviewed health habits including diet and exercise and skin cancer prevention Reviewed appropriate screening tests for age  Also reviewed health mt list, fam hx and immunization status , as well as social and family history   See HPI Labs reviewed Continue the vitamin D  Take care of yourself  Keep working on weight loss - use myfitness pal and keep exercising       Vitamin D deficiency    Low at 28.7 Pt is now taking 3000 iu daily and already feeling better  Disc imp to bone and overall health   Continue to monitor

## 2015-05-22 NOTE — Assessment & Plan Note (Signed)
Low at 28.7 Pt is now taking 3000 iu daily and already feeling better  Disc imp to bone and overall health   Continue to monitor

## 2015-05-22 NOTE — Assessment & Plan Note (Signed)
Reviewed health habits including diet and exercise and skin cancer prevention Reviewed appropriate screening tests for age  Also reviewed health mt list, fam hx and immunization status , as well as social and family history   See HPI Labs reviewed Continue the vitamin D  Take care of yourself  Keep working on weight loss - use myfitness pal and keep exercising

## 2015-05-22 NOTE — Assessment & Plan Note (Signed)
Discussed how this problem influences overall health and the risks it imposes  Reviewed plan for weight loss with lower calorie diet (via better food choices and also portion control or program like weight watchers) and exercise building up to or more than 30 minutes 5 days per week including some aerobic activity    

## 2015-09-01 ENCOUNTER — Ambulatory Visit (INDEPENDENT_AMBULATORY_CARE_PROVIDER_SITE_OTHER): Payer: Managed Care, Other (non HMO) | Admitting: Primary Care

## 2015-09-01 ENCOUNTER — Encounter: Payer: Self-pay | Admitting: Primary Care

## 2015-09-01 VITALS — BP 124/84 | HR 83 | Temp 97.8°F | Ht 64.0 in | Wt 186.0 lb

## 2015-09-01 DIAGNOSIS — M25562 Pain in left knee: Secondary | ICD-10-CM

## 2015-09-01 MED ORDER — NAPROXEN 500 MG PO TABS: 500.0000 mg | ORAL_TABLET | Freq: Two times a day (BID) | ORAL | Status: DC

## 2015-09-01 NOTE — Progress Notes (Signed)
Subjective:    Patient ID: Makayla [name], female    DOB: [birth year], 55 y.o.   MRN: [id number]  HPI  Makayla [name] is a 55 year old female who presents today with a chief complaint of knee pain. Her pain is located to the left lateral aspect of the posterior knee and has been present for about 1 month. Over the past week has gradually become worse.   Her pain is worse with flexion. She's also reporting numbness/tingling to bilateral dorsal aspect of her feet and left upper portion of posterior lower extremity. 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  Denies back pain or hip pain, recent injury or trauma. She's taken ibuprofen 800 mg sparingly and arthritis tylenol with some improvement.   Review of Systems  Musculoskeletal: Negative for back pain and joint swelling.       Knee pain  Skin: Negative for color change and rash.  Neurological: Positive for numbness.       Past Medical History  Diagnosis Date  . GERD (gastroesophageal reflux disease)   . Hyperlipidemia     no current med.  . Mass of breast, left 01/2012  . Dental crowns present     x 4  . Breast cancer ([place]) 01/10/12    Left breast lumpectomy=phyllodes tumor,boederline 3.1cm,margins neg.,for atypia or malignancy  . Bursitis, trochanteric     bi/lat   . Hypertension     white coat syndrome,  . Obesity     Social History   Social History  . Marital Status: Married    Spouse Name: N/A  . Number of Children: 1  . Years of Education: N/A   Occupational History  . RN    Social History Main Topics  . Smoking status: Never Smoker   . Smokeless tobacco: Never Used  . Alcohol Use: No  . Drug Use: No  . Sexual Activity: Not on file     Comment: has used estradiol patch 0.025  in the past, has stopped   Other Topics Concern  . Not on file   Social History Narrative    Past Surgical History  Procedure Laterality Date  . Tubal ligation    . Robotic assisted laparoscopic  sacrocolpopexy  06/14/2011    Procedure: ROBOTIC ASSISTED LAPAROSCOPIC SACROCOLPOPEXY;  Surgeon: Dutch Gray, MD;  Location: [facility];  Service: Urology;  Laterality: N/A;  . Rectocele repair  06/14/2011    Procedure: POSTERIOR REPAIR (RECTOCELE);  Surgeon: Dutch Gray, MD;  Location: [facility];  Service: Urology;  Laterality: N/A;  cystoscopy, SPARC sling  . Abdominal hysterectomy      partial  . Breast surgery  01/10/12    lumpectomy - left    Family History  Problem Relation Age of Onset  . Hypertension Mother   . Hyperlipidemia Mother   . Diabetes Mother   . Cancer Mother     uterine  . GER disease Father   . Depression Sister   . Heart disease Brother   . Heart attack Maternal Grandmother   . Hypertension Maternal Grandmother     Allergies  Allergen Reactions  . Lexapro [Escitalopram Oxalate]     Felt funny/ sleepless and anxious    Current Outpatient Prescriptions on File Prior to Visit  Medication Sig Dispense Refill  . b complex vitamins tablet Take 1 tablet by mouth daily.    Marland Kitchen ibuprofen (ADVIL,MOTRIN) 800 MG tablet TAKE ONE TABLET  BY MOUTH EVERY 8 HOURS AS NEEDED FOR PAIN 30 tablet 2  . ranitidine (ZANTAC) 300 MG tablet Take 1 tablet (300 mg total) by mouth 2 (two) times daily. 180 tablet 3  . VITAMIN D, CHOLECALCIFEROL, PO Take 1 tablet by mouth daily.    . [DISCONTINUED] estradiol (VIVELLE-DOT) 0.025 MG/24HR Place 1 patch onto the skin 2 (two) times a week. Monday and friday     No current facility-administered medications on file prior to visit.    BP 124/84 mmHg  Pulse 83  Temp(Src) 97.8 F (36.6 C) (Oral)  Ht 5\' 4"  (1.626 m)  Wt 186 lb (84.369 kg)  BMI 31.91 kg/m2  SpO2 97%    Objective:   Physical Exam  Constitutional: She appears well-nourished.  Cardiovascular: Normal rate and regular rhythm.   Pulmonary/Chest: Effort normal and breath sounds normal.  Musculoskeletal:       Left knee: She exhibits decreased range of motion. She exhibits no swelling and  normal alignment. No tenderness found.  Pain to left posterior knee, lateral side. No swelling, erythema, or tenderness. Decrease in ROM with moderate flexion. Ambulatory.   Skin: Skin is warm and dry.  No obvious signs of bakers cyst.          Assessment & Plan:  Knee pain:  Located to posterior left knee, latera side. No recent injury/trauma. Does play with her granddaughter, picking her up often. Suspect she's pulled a tendon which has irritated a local nerve. Exam with pain to moderate flexion when laying on exam table; otherwise unremarkable. Discussed supportive treatment options. Also discussed importance of weight loss to reduce load on knees. RX for naproxen PRN, ice, elevate, stretches. She is to notify us if no improvement in 2 weeks.

## 2015-09-01 NOTE — Progress Notes (Signed)
Pre visit review using our clinic review tool, if applicable. No additional management support is needed unless otherwise documented below in the visit note. 

## 2015-09-01 NOTE — Patient Instructions (Signed)
You may take Naproxen 500 mg twice daily, with meals, for 1-2 weeks as needed for pain.  Rest your knee, ice, and elevate. Slowly increase activity and start stretching knee to prevent stiffness.  Please give [place] a call in 2 weeks if no improvement or sooner if pain becomes worse.  It was a pleasure meeting you!  Knee Pain Knee pain is a very common symptom and can have many causes. Knee pain often goes away when you follow your health care provider's instructions for relieving pain and discomfort at home. However, knee pain can develop into a condition that needs treatment. Some conditions may include:  Arthritis caused by wear and tear (osteoarthritis).  Arthritis caused by swelling and irritation (rheumatoid arthritis or gout).  A cyst or growth in your knee.  An infection in your knee joint.  An injury that will not heal.  Damage, swelling, or irritation of the tissues that support your knee (torn ligaments or tendinitis). If your knee pain continues, additional tests may be ordered to diagnose your condition. Tests may include X-rays or other imaging studies of your knee. You may also need to have fluid removed from your knee. Treatment for ongoing knee pain depends on the cause, but treatment may include:  Medicines to relieve pain or swelling.  Steroid injections in your knee.  Physical therapy.  Surgery. HOME CARE INSTRUCTIONS  Take medicines only as directed by your health care provider.  Rest your knee and keep it raised (elevated) while you are resting.  Do not do things that cause or worsen pain.  Avoid high-impact activities or exercises, such as running, jumping rope, or doing jumping jacks.  Apply ice to the knee area:  Put ice in a plastic bag.  Place a towel between your skin and the bag.  Leave the ice on for 20 minutes, 2-3 times a day.  Ask your health care provider if you should wear an elastic knee support.  Keep a pillow under your knee when  you sleep.  Lose weight if you are overweight. Extra weight can put pressure on your knee.  Do not use any tobacco products, including cigarettes, chewing tobacco, or electronic cigarettes. If you need help quitting, ask your health care provider. Smoking may slow the healing of any bone and joint problems that you may have. SEEK MEDICAL CARE IF:  Your knee pain continues, changes, or gets worse.  You have a fever along with knee pain.  Your knee buckles or locks up.  Your knee becomes more swollen. SEEK IMMEDIATE MEDICAL CARE IF:   Your knee joint feels hot to the touch.  You have chest pain or trouble breathing.   This information is not intended to replace advice given to you by your health care provider. Make sure you discuss any questions you have with your health care provider.   Document Released: 05/20/2007 Document Revised: 08/13/2014 Document Reviewed: 03/08/2014 Elsevier Interactive Patient Education [employer].

## 2015-09-08 ENCOUNTER — Encounter: Payer: Self-pay | Admitting: Physician Assistant

## 2015-09-08 ENCOUNTER — Ambulatory Visit: Payer: Self-pay | Admitting: Physician Assistant

## 2015-09-08 VITALS — BP 120/60 | HR 93 | Temp 98.3°F

## 2015-09-08 DIAGNOSIS — M25562 Pain in left knee: Secondary | ICD-10-CM

## 2015-09-08 MED ORDER — TRAMADOL HCL 50 MG PO TABS: 50.0000 mg | ORAL_TABLET | Freq: Three times a day (TID) | ORAL | Status: DC | PRN

## 2015-09-08 MED ORDER — METHYLPREDNISOLONE 4 MG PO TBPK: ORAL_TABLET | ORAL | Status: DC

## 2015-09-08 NOTE — Progress Notes (Addendum)
Patient ID: Makayla [name], female   DOB: September 06, 1960, 55 y.o.   MRN: [id number] Patient has been scheduled to see Dr. Earnestine Leys at [facility] on 09/16/15 @ 9:15am. Patient have been notified and has accepted the appointment.

## 2015-09-08 NOTE — Progress Notes (Signed)
S: c/o left knee pain, increased pain at medial and posterior aspect, no known injury, knee has been hurting awhile but yesterday felt large pop and snap, no numbness or tingling, pain increased when goes from sitting to standing or when puts a lot of pressure on knee, was taking ibuprofen then saw her pcp and was put on naproxen, is bruising a lot with this med, used ice  O: vitals wnl, pt tearful, left knee tender at medial aspect of joint line, posterior nontender, full rom, ligaments appear intact, n/v intact, pt limps when walking due to pain  A: acute left knee pain, concerns of torn meniscus  P: stop naproxen and ibuprofen as pt has bruises on arms b/l, applied a neoprene sleeve, rx for medrol dose pack, tramadol, elevate and ice, refer to ortho

## 2015-09-13 ENCOUNTER — Ambulatory Visit (INDEPENDENT_AMBULATORY_CARE_PROVIDER_SITE_OTHER)
Admission: RE | Admit: 2015-09-13 | Discharge: 2015-09-13 | Disposition: A | Discharge status: Home / Self Care | Payer: Managed Care, Other (non HMO) | Source: Ambulatory Visit | Attending: Family Medicine | Admitting: Family Medicine

## 2015-09-13 ENCOUNTER — Ambulatory Visit (INDEPENDENT_AMBULATORY_CARE_PROVIDER_SITE_OTHER): Payer: Managed Care, Other (non HMO) | Admitting: Family Medicine

## 2015-09-13 ENCOUNTER — Encounter: Payer: Self-pay | Admitting: Family Medicine

## 2015-09-13 VITALS — BP 126/86 | HR 80 | Temp 98.1°F | Ht 64.0 in | Wt 181.2 lb

## 2015-09-13 DIAGNOSIS — L0232 Furuncle of buttock: Secondary | ICD-10-CM | POA: Insufficient documentation

## 2015-09-13 DIAGNOSIS — M25562 Pain in left knee: Secondary | ICD-10-CM

## 2015-09-13 MED ORDER — DOXYCYCLINE HYCLATE 100 MG PO TABS: 100.0000 mg | ORAL_TABLET | Freq: Two times a day (BID) | ORAL | Status: DC

## 2015-09-13 NOTE — Progress Notes (Signed)
Subjective:    Patient ID: Makayla [name], female    DOB: 18-Apr-1961, 55 y.o.   MRN: [id number]  HPI Knee pain - both (worse on the L) Since December  No injury  Has been playing and carrying a 58 month old   Came in and saw Anda Kraft- given naproxen for ? Tendonitis  It made her bruise easily so she stopped it  ? If it helped very much  Got worse the other day when she stepped out of the car  Pain in back of the knee sharp and sudden   Employee clinic - said she probably tore a meniscus  Given a knee support (neoprene) Feels unstable   Pain now is more medial/ still some posterior Swollen but not bruised  Stiff -after inactivity  Tries to elevate and ice as much as she can   Has appt with ortho-would rather change to [facility]   Also has a spot on buttock- L  ? If a boil Really sore  Feels warm  Not draining    Patient Active Problem List   Diagnosis Date Noted  . Boil of buttock 09/13/2015  . Left knee pain 09/13/2015  . Vitamin D deficiency 05/20/2015  . Obesity 05/20/2015  . Gallstones 02/02/2014  . RUQ pain 01/19/2014  . Perimenopause 02/03/2013  . Changeable mood ([place]) 02/03/2013  . 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 Breast cancer ([place]) 01/10/2012  . Other screening mammogram 10/25/2011  . Routine general medical examination at a health care facility 10/16/2011  . MENOPAUSAL SYNDROME 03/06/2010  . Hyperlipidemia LDL goal <130 12/04/2007  . GERD 12/04/2007  . ELEVATED BLOOD PRESSURE WITHOUT DIAGNOSIS OF HYPERTENSION 12/04/2007   Past Medical History  Diagnosis Date  . GERD (gastroesophageal reflux disease)   . Hyperlipidemia     no current med.  . Mass of breast, left 01/2012  . Dental crowns present     x 4  . Breast cancer ([place]) 01/10/12    Left breast lumpectomy=phyllodes tumor,boederline 3.1cm,margins neg.,for atypia or malignancy  . Bursitis, trochanteric     bi/lat   . Hypertension     white coat  syndrome,  . Obesity    Past Surgical History  Procedure Laterality Date  . Tubal ligation    . Robotic assisted laparoscopic sacrocolpopexy  06/14/2011    Procedure: ROBOTIC ASSISTED LAPAROSCOPIC SACROCOLPOPEXY;  Surgeon: Dutch Gray, MD;  Location: [facility];  Service: Urology;  Laterality: N/A;  . Rectocele repair  06/14/2011    Procedure: POSTERIOR REPAIR (RECTOCELE);  Surgeon: Dutch Gray, MD;  Location: [facility];  Service: Urology;  Laterality: N/A;  cystoscopy, SPARC sling  . Abdominal hysterectomy      partial  . Breast surgery  01/10/12    lumpectomy - left   Social History  Substance Use Topics  . Smoking status: Never Smoker   . Smokeless tobacco: Never Used  . Alcohol Use: No   Family History  Problem Relation Age of Onset  . Hypertension Mother   . Hyperlipidemia Mother   . Diabetes Mother   . Cancer Mother     uterine  . GER disease Father   . Depression Sister   . Heart disease Brother   . Heart attack Maternal Grandmother   . Hypertension Maternal Grandmother    No Known Allergies Current Outpatient Prescriptions on File Prior to Visit  Medication Sig Dispense Refill  . b complex vitamins tablet Take 1 tablet  by mouth daily.    . ranitidine (ZANTAC) 300 MG tablet Take 1 tablet (300 mg total) by mouth 2 (two) times daily. 180 tablet 3  . VITAMIN D, CHOLECALCIFEROL, PO Take 1 tablet by mouth daily.    . [DISCONTINUED] estradiol (VIVELLE-DOT) 0.025 MG/24HR Place 1 patch onto the skin 2 (two) times a week. Monday and friday     No current facility-administered medications on file prior to visit.    Review of Systems Review of Systems  Constitutional: Negative for fever, appetite change, fatigue and unexpected weight change.  Eyes: Negative for pain and visual disturbance.  Respiratory: Negative for cough and shortness of breath.   Cardiovascular: Negative for cp or palpitations    Gastrointestinal: Negative for nausea, diarrhea and constipation.  Genitourinary:  Negative for urgency and frequency.  Skin: Negative for pallor or rash  Pos for boil on buttocks  MSK pos for knee pain / neg for other joint swelling  Neurological: Negative for weakness, light-headedness, numbness and headaches.  Hematological: Negative for adenopathy. Does not bruise/bleed easily.  Psychiatric/Behavioral: Negative for dysphoric mood. The patient is not nervous/anxious.         Objective:   Physical Exam  Constitutional: She appears well-developed and well-nourished. No distress.  obese and well appearing   HENT:  Head: Normocephalic and atraumatic.  Eyes: Conjunctivae and EOM are normal. Pupils are equal, round, and reactive to light.  Neck: Normal range of motion. Neck supple.  Cardiovascular: Normal rate and regular rhythm.   Pulmonary/Chest: Effort normal and breath sounds normal.  Musculoskeletal:       Left knee: She exhibits decreased range of motion and swelling. She exhibits no effusion, no ecchymosis, no erythema, normal alignment, no LCL laxity, normal patellar mobility, no bony tenderness, normal meniscus and no MCL laxity. Tenderness found. Medial joint line and lateral joint line tenderness noted.  Most tender medially and posterior Nl Lachman and ant drawer  Cannot tell if effusion/ due to body habitus  Flex to 90 deg Pos pain with bounce  Pos mc murray test  No crepitus  Lymphadenopathy:    She has no cervical adenopathy.  Neurological: She displays no atrophy. No cranial nerve deficit or sensory deficit. She exhibits normal muscle tone.  Skin: Skin is warm and dry. No rash noted. There is erythema. No pallor.  1-2 cm boil on L buttock that looks like it has already drained  Unable to express material Mod tender Erythema present  Not fluctuant   Psychiatric: She has a normal mood and affect.          Assessment & Plan:   Problem List Items Addressed This Visit      Musculoskeletal and Integument   Boil of buttock    Unable to  express pus or fluid- looks like it may have already drained   Take doxycycline for the boil Warm compress /sitz baths Clean with soap and water  Update if not starting to improve in a week or if worsening          Other   Left knee pain - Primary    Some medial and post tenderness Meniscal injury cannot be ruled out given timing/hx  Unable to tell if effusion on exam  Elevate knee when able  Ice when able Knee sleeve when active  Xray now Stop at check out for ref to ortho in [facility]       Relevant Orders   DG Knee Complete 4 Views Left (Completed)  Ambulatory referral to Orthopedic Surgery

## 2015-09-13 NOTE — Patient Instructions (Signed)
Elevate knee when able  Ice when able Knee sleeve when active  Xray now Stop at check out for ref to ortho in [place]   Take doxycycline for the boil Warm compress /sitz baths Clean with soap and water  Update if not starting to improve in a week or if worsening

## 2015-09-13 NOTE — Progress Notes (Signed)
Pre visit review using our clinic review tool, if applicable. No additional management support is needed unless otherwise documented below in the visit note. 

## 2015-09-15 NOTE — Assessment & Plan Note (Addendum)
Unable to express pus or fluid- looks like it may have already drained   Take doxycycline for the boil Warm compress /sitz baths Clean with soap and water  Update if not starting to improve in a week or if worsening

## 2015-09-15 NOTE — Assessment & Plan Note (Signed)
Some medial and post tenderness Meniscal injury cannot be ruled out given timing/hx  Unable to tell if effusion on exam  Elevate knee when able  Ice when able Knee sleeve when active  Xray now Stop at check out for ref to ortho in [facility]

## 2016-01-24 ENCOUNTER — Other Ambulatory Visit: Payer: Self-pay | Admitting: Family Medicine

## 2016-01-24 DIAGNOSIS — Z1231 Encounter for screening mammogram for malignant neoplasm of breast: Secondary | ICD-10-CM

## 2016-01-27 ENCOUNTER — Emergency Department (HOSPITAL_COMMUNITY): Payer: Managed Care, Other (non HMO)

## 2016-01-27 ENCOUNTER — Other Ambulatory Visit: Payer: Self-pay

## 2016-01-27 ENCOUNTER — Emergency Department (HOSPITAL_COMMUNITY)
Admission: EM | Admit: 2016-01-27 | Discharge: 2016-01-28 | Disposition: A | Discharge status: Home / Self Care | Payer: Managed Care, Other (non HMO) | Attending: Emergency Medicine | Admitting: Emergency Medicine

## 2016-01-27 ENCOUNTER — Encounter (HOSPITAL_COMMUNITY): Payer: Self-pay | Admitting: *Deleted

## 2016-01-27 DIAGNOSIS — K8020 Calculus of gallbladder without cholecystitis without obstruction: Secondary | ICD-10-CM | POA: Diagnosis not present

## 2016-01-27 DIAGNOSIS — Z79899 Other long term (current) drug therapy: Secondary | ICD-10-CM | POA: Insufficient documentation

## 2016-01-27 DIAGNOSIS — I10 Essential (primary) hypertension: Secondary | ICD-10-CM | POA: Diagnosis not present

## 2016-01-27 DIAGNOSIS — R1011 Right upper quadrant pain: Secondary | ICD-10-CM | POA: Diagnosis present

## 2016-01-27 DIAGNOSIS — Z853 Personal history of malignant neoplasm of breast: Secondary | ICD-10-CM | POA: Diagnosis not present

## 2016-01-27 LAB — COMPREHENSIVE METABOLIC PANEL
ALK PHOS: 74 U/L (ref 38–126)
ALT: 14 U/L (ref 14–54)
AST: 16 U/L (ref 15–41)
Albumin: 4.3 g/dL (ref 3.5–5.0)
Anion gap: 8 (ref 5–15)
BUN: 9 mg/dL (ref 6–20)
CALCIUM: 9.7 mg/dL (ref 8.9–10.3)
CHLORIDE: 104 mmol/L (ref 101–111)
CO2: 27 mmol/L (ref 22–32)
CREATININE: 0.91 mg/dL (ref 0.44–1.00)
Glucose, Bld: 100 mg/dL — ABNORMAL HIGH (ref 65–99)
Potassium: 3.6 mmol/L (ref 3.5–5.1)
Sodium: 139 mmol/L (ref 135–145)
Total Bilirubin: 0.4 mg/dL (ref 0.3–1.2)
Total Protein: 7.2 g/dL (ref 6.5–8.1)

## 2016-01-27 LAB — URINALYSIS, ROUTINE W REFLEX MICROSCOPIC
BILIRUBIN URINE: NEGATIVE
Glucose, UA: NEGATIVE mg/dL
KETONES UR: 15 mg/dL — AB
Nitrite: NEGATIVE
Protein, ur: NEGATIVE mg/dL
Specific Gravity, Urine: 1.011 (ref 1.005–1.030)
pH: 5.5 (ref 5.0–8.0)

## 2016-01-27 LAB — CBC
HCT: 43.5 % (ref 36.0–46.0)
Hemoglobin: 14.1 g/dL (ref 12.0–15.0)
MCH: 27 pg (ref 26.0–34.0)
MCHC: 32.4 g/dL (ref 30.0–36.0)
MCV: 83.2 fL (ref 78.0–100.0)
PLATELETS: 135 10*3/uL — AB (ref 150–400)
RBC: 5.23 MIL/uL — ABNORMAL HIGH (ref 3.87–5.11)
RDW: 13.4 % (ref 11.5–15.5)
WBC: 9.5 10*3/uL (ref 4.0–10.5)

## 2016-01-27 LAB — URINE MICROSCOPIC-ADD ON

## 2016-01-27 LAB — LIPASE, BLOOD: LIPASE: 22 U/L (ref 11–51)

## 2016-01-27 MED ORDER — FENTANYL CITRATE (PF) 100 MCG/2ML IJ SOLN
50.0000 ug | Freq: Once | INTRAMUSCULAR | Status: AC
  Administered 2016-01-27: 50 ug via INTRAVENOUS
  Filled 2016-01-27: qty 2

## 2016-01-27 MED ORDER — SODIUM CHLORIDE 0.9 % IV SOLN
INTRAVENOUS | Status: DC
  Administered 2016-01-28: 02:00:00 via INTRAVENOUS

## 2016-01-27 MED ORDER — ONDANSETRON HCL 4 MG/2ML IJ SOLN
4.0000 mg | Freq: Once | INTRAMUSCULAR | Status: AC
Start: 2016-01-27 — End: 2016-01-27
  Administered 2016-01-27: 4 mg via INTRAVENOUS
  Filled 2016-01-27: qty 2

## 2016-01-27 NOTE — ED Provider Notes (Signed)
TIME SEEN: 11:22pm  CHIEF COMPLAINT: abdominal pain  HPI: HPI Comments:  Makayla [name] is a 55 y.o. female with a PMHx of cholelithiasis, and GERD who presents to the Emergency Department complaining of gradual onset, gradually worsening, constant, pinching, 7/10 RUQ abdominal pain onset 4 days PTA. Pt endorses her pain radiating into her R flank, R shoulder, R hip, and between her shoulder blades. Pt has associated loss of appetite and nausea. Her pain is exacerbated slightly after eating breakfast. She reports that she has taken Motrin 1 day ago and today with only mild relief of her pain. She has also tried a home remedy involving 8oz of apple juice and apple cider vinegar that she states also only mildly reduced her pain. Pt reports that she has a hx of cholelithiasis, and opted out of a surgery with Dr. Marlou Starks to alleviate her pain a few years ago because she was not in any significant pain at that time. Her next appointment with him is July 18th (approximately 1 month). She states that her pain today feels similar to her hx of cholelithiasis. She reports a PSHx of Sacrocolpopexy. Denies fever, chills, vomiting, dysuria, diarrhea, melena, frequency, urgency, SOB, or cough. She has never had persistent pain like this before. Was instructed by nurse on call for Dr. Marlou Starks to come to the emergency department.  PCP-Marne Tower, MD with [facility]  ROS: See HPI Constitutional: no fever  Eyes: no drainage  ENT: no runny nose   Cardiovascular:  R sided chest pain  Resp: no SOB  GI: no vomiting GU: no dysuria Integumentary: no rash  Allergy: no hives  Musculoskeletal: no leg swelling Neurological: no slurred speech ROS otherwise negative  PAST MEDICAL HISTORY/PAST SURGICAL HISTORY:  Past Medical History  Diagnosis Date  . GERD (gastroesophageal reflux disease)   . Hyperlipidemia     no current med.  . Mass of breast, left 01/2012  . Dental crowns present     x 4  . Breast cancer ([place]) 01/10/12     Left breast lumpectomy=phyllodes tumor,boederline 3.1cm,margins neg.,for atypia or malignancy  . Bursitis, trochanteric     bi/lat   . Hypertension     white coat syndrome,  . Obesity     MEDICATIONS:  Prior to Admission medications   Medication Sig Start Date End Date Taking? Authorizing Provider  b complex vitamins tablet Take 1 tablet by mouth daily.   Yes Historical Provider, MD  CALCIUM PO Take 1 tablet by mouth daily.   Yes Historical Provider, MD  ibuprofen (ADVIL,MOTRIN) 800 MG tablet Take 800 mg by mouth every 8 (eight) hours as needed for moderate pain.   Yes Historical Provider, MD  ranitidine (ZANTAC) 300 MG tablet Take 1 tablet (300 mg total) by mouth 2 (two) times daily. 05/20/15  Yes Abner Greenspan, MD  VITAMIN D, CHOLECALCIFEROL, PO Take 1 tablet by mouth daily.   Yes Historical Provider, MD  doxycycline (VIBRA-TABS) 100 MG tablet Take 1 tablet (100 mg total) by mouth 2 (two) times daily. 09/13/15   Abner Greenspan, MD    ALLERGIES:  No Known Allergies  SOCIAL HISTORY:  Social History  Substance Use Topics  . Smoking status: Never Smoker   . Smokeless tobacco: Never Used  . Alcohol Use: No    FAMILY HISTORY: Family History  Problem Relation Age of Onset  . Hypertension Mother   . Hyperlipidemia Mother   . Diabetes Mother   . Cancer Mother     uterine  .  GER disease Father   . Depression Sister   . Heart disease Brother   . Heart attack Maternal Grandmother   . Hypertension Maternal Grandmother     EXAM: BP 134/73 mmHg  Pulse 83  Temp(Src) 98.1 F (36.7 C) (Oral)  Resp 17  Ht 5\' 3"  (1.6 m)  Wt 183 lb (83.008 kg)  BMI 32.43 kg/m2  SpO2 97% CONSTITUTIONAL: Alert and oriented and responds appropriately to questions. Well-appearing; well-nourished HEAD: Normocephalic EYES: Conjunctivae clear, PERRL ENT: normal nose; no rhinorrhea; moist mucous membranes NECK: Supple, no meningismus, no LAD  CARD: RRR; S1 and S2 appreciated; no murmurs, no clicks, no  rubs, no gallops RESP: Normal chest excursion without splinting or tachypnea; breath sounds clear and equal bilaterally; no wheezes, no rhonchi, no rales, no hypoxia or respiratory distress, speaking full sentences ABD/GI: Normal bowel sounds; non-distended; soft, tender in the RUQ with a positive murphy's sign, no rebound, no guarding, no peritoneal signs, no tenderness at McBurney's point BACK:  The back appears normal and is non-tender to palpation, there is no CVA tenderness EXT: Normal ROM in all joints; non-tender to palpation; no edema; normal capillary refill; no cyanosis, no calf tenderness or swelling  SKIN: Normal color for age and race; warm; no rash NEURO: Moves all extremities equally, sensation to light touch intact diffusely, cranial nerves II through XII intact PSYCH: The patient's mood and manner are appropriate. Grooming and personal hygiene are appropriate.   MEDICAL DECISION MAKING: Patient here with right upper quadrant abdominal pain that radiates into her chest, between her shoulder blades that feel similar to her prior biliary colic. Has had ultrasound 2 years ago that showed gallstones. States that she's never had persistent pain before. Today she has a positive Murphy sign but is afebrile and has normal LFTs, lipase and no leukocytosis. We'll obtain EKG, troponin, chest x-ray given chest pain although atypical. Will also repeat an abdominal ultrasound today. We'll give IV fluids, pain and nausea medicine. She is NPO.  ED PROGRESS: 2:40 AM  Pt's pain has significant improved. Her chest x-ray is clear. Troponin is negative. Right upper quadrant ultrasound shows cholelithiasis without cholecystitis or choledocholithiasis. I feel she states to be discharged him with pain medication, nausea medicine and close outpatient surgery follow-up. She will call her surgeon to see if she can schedule an earlier appointment. Has appointment scheduled now for July 18. Discussed with her she has  fever, jaundice, pain that is uncontrolled with pain medication at home, vomiting that will not stop to return to the emergency department. She verbalizes understanding is comfortable with this plan.   At this time, I do not feel there is any life-threatening condition present. I have reviewed and discussed all results (EKG, imaging, lab, urine as appropriate), exam findings with patient. I have reviewed nursing notes and appropriate previous records.  I feel the patient is safe to be discharged home without further emergent workup. Discussed usual and customary return precautions. Patient and family (if present) verbalize understanding and are comfortable with this plan.  Patient will follow-up with their primary care provider. If they do not have a primary care provider, information for follow-up has been provided to them. All questions have been answered.      EKG Interpretation  Date/Time:  Friday January 27 2016 23:41:49 EDT Ventricular Rate:  73 PR Interval:    QRS Duration: 103 QT Interval:  440 QTC Calculation: 485 R Axis:   63 Text Interpretation:  Sinus rhythm Probable left atrial  enlargement RSR' in V1 or V2, right VCD or RVH No significant change since last tracing Confirmed by Jancarlo Biermann,  DO, Joshwa Hemric [phone]) on 01/27/2016 11:59:32 PM       This chart was scribed in my presence and reviewed by me personally.    [place], DO 01/28/16 [id number]

## 2016-01-27 NOTE — ED Notes (Signed)
Patient transported to Ultrasound 

## 2016-01-27 NOTE — ED Notes (Signed)
Pt c/o RUQ pain & R flank pain, pt reports hx of gallbladder, denies dysuria, pt c/o nausea onset yesterday, denies v/d, pt A&O x4

## 2016-01-28 ENCOUNTER — Emergency Department (HOSPITAL_COMMUNITY): Payer: Managed Care, Other (non HMO)

## 2016-01-28 LAB — TROPONIN I: Troponin I: <0.03 ng/mL (ref <0.031)

## 2016-01-28 MED ORDER — HYDROCODONE-ACETAMINOPHEN 5-325 MG PO TABS: 1.0000 | ORAL_TABLET | Freq: Four times a day (QID) | ORAL | Status: DC | PRN

## 2016-01-28 MED ORDER — ONDANSETRON 4 MG PO TBDP: 4.0000 mg | ORAL_TABLET | Freq: Three times a day (TID) | ORAL | Status: DC | PRN

## 2016-01-28 NOTE — Discharge Instructions (Signed)
Cholelithiasis Cholelithiasis (also called gallstones) is a form of gallbladder disease in which gallstones form in your gallbladder. The gallbladder is an organ that stores bile made in the liver, which helps digest fats. Gallstones begin as small crystals and slowly grow into stones. Gallstone pain occurs when the gallbladder spasms and a gallstone is blocking the duct. Pain can also occur when a stone passes out of the duct.  RISK FACTORS  Being female.   Having multiple pregnancies. Health care providers sometimes advise removing diseased gallbladders before future pregnancies.   Being obese.  Eating a diet heavy in fried foods and fat.   Being older than 90 years and increasing age.   Prolonged use of medicines containing female hormones.   Having diabetes mellitus.   Rapidly losing weight.   Having a family history of gallstones (heredity).  SYMPTOMS  Nausea.   Vomiting.  Abdominal pain.   Yellowing of the skin (jaundice).   Sudden pain. It may persist from several minutes to several hours.  Fever.   Tenderness to the touch. In some cases, when gallstones do not move into the bile duct, people have no pain or symptoms. These are called "silent" gallstones.  TREATMENT Silent gallstones do not need treatment. In severe cases, emergency surgery may be required. Options for treatment include:  Surgery to remove the gallbladder. This is the most common treatment.  Medicines. These do not always work and may take 6-12 months or more to work.  Shock wave treatment (extracorporeal biliary lithotripsy). In this treatment an ultrasound machine sends shock waves to the gallbladder to break gallstones into smaller pieces that can pass into the intestines or be dissolved by medicine. HOME CARE INSTRUCTIONS   Only take over-the-counter or prescription medicines for pain, discomfort, or fever as directed by your health care provider.   Follow a low-fat diet until  seen again by your health care provider. Fat causes the gallbladder to contract, which can result in pain.   Follow up with your health care provider as directed. Attacks are almost always recurrent and surgery is usually required for permanent treatment.  SEEK IMMEDIATE MEDICAL CARE IF:   Your pain increases and is not controlled by medicines.   You have a fever or persistent symptoms for more than 2-3 days.   You have a fever and your symptoms suddenly get worse.   You have persistent nausea and vomiting.  MAKE SURE YOU:   Understand these instructions.  Will watch your condition.  Will get help right away if you are not doing well or get worse.   This information is not intended to replace advice given to you by your health care provider. Make sure you discuss any questions you have with your health care provider.   Document Released: 07/19/2005 Document Revised: 03/25/2013 Document Reviewed: 01/14/2013 Elsevier Interactive Patient Education 2016 [place] Diet for Pancreatitis or Gallbladder Conditions A low-fat diet can be helpful if you have pancreatitis or a gallbladder condition. With these conditions, your pancreas and gallbladder have trouble digesting fats. A healthy eating plan with less fat will help rest your pancreas and gallbladder and reduce your symptoms. WHAT DO I NEED TO KNOW ABOUT THIS DIET?  Eat a low-fat diet.  Reduce your fat intake to less than 20-30% of your total daily calories. This is less than 50-60 g of fat per day.  Remember that you need some fat in your diet. Ask your dietician what your daily goal should be.  Choose  nonfat and low-fat healthy foods. Look for the words "nonfat," "low fat," or "fat free."  As a guide, look on the label and choose foods with less than 3 g of fat per serving. Eat only one serving.  Avoid alcohol.  Do not smoke. If you need help quitting, talk with your health care provider.  Eat small  frequent meals instead of three large heavy meals. WHAT FOODS CAN I EAT? Grains Include healthy grains and starches such as potatoes, wheat bread, fiber-rich cereal, and brown rice. Choose whole grain options whenever possible. In adults, whole grains should account for 45-65% of your daily calories.  Fruits and Vegetables Eat plenty of fruits and vegetables. Fresh fruits and vegetables add fiber to your diet. Meats and Other Protein Sources Eat lean meat such as chicken and pork. Trim any fat off of meat before cooking it. Eggs, fish, and beans are other sources of protein. In adults, these foods should account for 10-35% of your daily calories. Dairy Choose low-fat milk and dairy options. Dairy includes fat and protein, as well as calcium.  Fats and Oils Limit high-fat foods such as fried foods, sweets, baked goods, sugary drinks.  Other Creamy sauces and condiments, such as mayonnaise, can add extra fat. Think about whether or not you need to use them, or use smaller amounts or low fat options. WHAT FOODS ARE NOT RECOMMENDED?  High fat foods, such as:  [employer].  Ice cream.  [place] toast.  Sweet rolls.  Pizza.  Cheese bread.  Foods covered with batter, butter, creamy sauces, or cheese.  Fried foods.  Sugary drinks and desserts.  Foods that cause gas or bloating   This information is not intended to replace advice given to you by your health care provider. Make sure you discuss any questions you have with your health care provider.   Document Released: 07/28/2013 Document Reviewed: 07/28/2013 Elsevier Interactive Patient Education [employer].

## 2016-01-30 ENCOUNTER — Telehealth: Payer: Self-pay | Admitting: *Deleted

## 2016-01-30 NOTE — Telephone Encounter (Signed)
Pharm D called related to pt presenting undated Rx signed by [employer].  [facility] searched chart to find that pt was admitted to ED 6/23 and was seen by [facility].

## 2016-02-01 ENCOUNTER — Ambulatory Visit
Admission: RE | Admit: 2016-02-01 | Discharge: 2016-02-01 | Disposition: A | Discharge status: Home / Self Care | Payer: Managed Care, Other (non HMO) | Source: Ambulatory Visit | Attending: Family Medicine | Admitting: Family Medicine

## 2016-02-01 DIAGNOSIS — Z1231 Encounter for screening mammogram for malignant neoplasm of breast: Secondary | ICD-10-CM

## 2016-02-02 ENCOUNTER — Other Ambulatory Visit: Payer: Self-pay | Admitting: General Surgery

## 2016-02-06 ENCOUNTER — Ambulatory Visit: Payer: Self-pay | Admitting: Physician Assistant

## 2016-02-06 ENCOUNTER — Encounter: Payer: Self-pay | Admitting: Physician Assistant

## 2016-02-06 VITALS — BP 132/78 | HR 76 | Temp 97.6°F

## 2016-02-06 DIAGNOSIS — H1131 Conjunctival hemorrhage, right eye: Secondary | ICD-10-CM

## 2016-02-06 NOTE — Progress Notes (Signed)
S: noticed a large red bloody looking area in r eye on Friday, area is not painful, no change in vision, no drainage or head congestion, just worried as is having surgery this Friday  O: vitals wnl, nad, perrl eomi, conjunctiva is injected with blood on medial lower aspect, ENT wnl, neck supple no lymph, lungs c ta , cv rrr  A: subconjunctival hemorrhage  P: reassurance

## 2016-02-06 NOTE — Pre-Procedure Instructions (Signed)
Makayla [name]  02/06/2016      [facility] [address], [place] - [address] [place] [address] Phone: [phone] Fax: [phone]    Your procedure is scheduled on Friday, July 7th, 2017.  Report to [facility] Admitting at 5:30 A.M.   Call this number if you have problems the morning of surgery:  [phone]   Remember:  Do not eat food or drink liquids after midnight.   Take these medicines the morning of surgery with A SIP OF WATER: Hydrocodone-acetaminophen (Norco/Vicodin) if needed, Ondansetron (Zofran) if needed, Ranitidine (Zantac).  Stop taking: Aspirin, NSAIDS, Aleve, Naproxen, Ibuprofen, Advil, Motrin, BC's, Goody's, Fish oil, all herbal medications, and all vitamins.    Do not wear jewelry, make-up or nail polish.  Do not wear lotions, powders, or perfumes.  You may NOT wear deoderant.  Do not shave 48 hours prior to surgery.    Do not bring valuables to the hospital.  [facility] is not responsible for any belongings or valuables.  Contacts, dentures or bridgework may not be worn into surgery.  Leave your suitcase in the car.  After surgery it may be brought to your room.  For patients admitted to the hospital, discharge time will be determined by your treatment team.  Patients discharged the day of surgery will not be allowed to drive home.   Special instructions:  Preparing for Surgery  Please read over the following fact sheets that you were given. Pain Booklet    [place]- Preparing For Surgery  Before surgery, you can play an important role. Because skin is not sterile, your skin needs to be as free of germs as possible. You can reduce the number of germs on your skin by washing with CHG (chlorahexidine gluconate) Soap before surgery.  CHG is an antiseptic cleaner which kills germs and bonds with the skin to continue killing germs even after washing.  Please do not use if you have an allergy to CHG or  antibacterial soaps. If your skin becomes reddened/irritated stop using the CHG.  Do not shave (including legs and underarms) for at least 48 hours prior to first CHG shower. It is OK to shave your face.  Please follow these instructions carefully.   1. Shower the NIGHT BEFORE SURGERY and the MORNING OF SURGERY with CHG.   2. If you chose to wash your hair, wash your hair first as usual with your normal shampoo.  3. After you shampoo, rinse your hair and body thoroughly to remove the shampoo.  4. Use CHG as you would any other liquid soap. You can apply CHG directly to the skin and wash gently with a scrungie or a clean washcloth.   5. Apply the CHG Soap to your body ONLY FROM THE NECK DOWN.  Do not use on open wounds or open sores. Avoid contact with your eyes, ears, mouth and genitals (private parts). Wash genitals (private parts) with your normal soap.  6. Wash thoroughly, paying special attention to the area where your surgery will be performed.  7. Thoroughly rinse your body with warm water from the neck down.  8. DO NOT shower/wash with your normal soap after using and rinsing off the CHG Soap.  9. Pat yourself dry with a CLEAN TOWEL.   10. Wear CLEAN PAJAMAS   11. Place CLEAN SHEETS on your bed the night of your first shower and DO NOT SLEEP WITH PETS.  Day of Surgery: Do  not apply any deodorants/lotions. Please wear clean clothes to the hospital/surgery center.

## 2016-02-08 ENCOUNTER — Encounter (HOSPITAL_COMMUNITY)
Admission: RE | Admit: 2016-02-08 | Discharge: 2016-02-08 | Disposition: A | Discharge status: Home / Self Care | Payer: Managed Care, Other (non HMO) | Source: Ambulatory Visit | Attending: General Surgery | Admitting: General Surgery

## 2016-02-08 ENCOUNTER — Encounter (HOSPITAL_COMMUNITY): Payer: Self-pay

## 2016-02-08 DIAGNOSIS — K219 Gastro-esophageal reflux disease without esophagitis: Secondary | ICD-10-CM | POA: Diagnosis not present

## 2016-02-08 DIAGNOSIS — Z79899 Other long term (current) drug therapy: Secondary | ICD-10-CM | POA: Diagnosis not present

## 2016-02-08 DIAGNOSIS — K8020 Calculus of gallbladder without cholecystitis without obstruction: Secondary | ICD-10-CM | POA: Diagnosis present

## 2016-02-08 DIAGNOSIS — K8010 Calculus of gallbladder with chronic cholecystitis without obstruction: Secondary | ICD-10-CM | POA: Diagnosis not present

## 2016-02-08 DIAGNOSIS — Z79890 Hormone replacement therapy (postmenopausal): Secondary | ICD-10-CM | POA: Diagnosis not present

## 2016-02-08 DIAGNOSIS — I10 Essential (primary) hypertension: Secondary | ICD-10-CM | POA: Diagnosis not present

## 2016-02-08 LAB — CBC
HEMATOCRIT: 43.9 % (ref 36.0–46.0)
HEMOGLOBIN: 14.1 g/dL (ref 12.0–15.0)
MCH: 27.3 pg (ref 26.0–34.0)
MCHC: 32.1 g/dL (ref 30.0–36.0)
MCV: 84.9 fL (ref 78.0–100.0)
Platelets: 143 10*3/uL — ABNORMAL LOW (ref 150–400)
RBC: 5.17 MIL/uL — AB (ref 3.87–5.11)
RDW: 13.5 % (ref 11.5–15.5)
WBC: 8.3 10*3/uL (ref 4.0–10.5)

## 2016-02-08 LAB — BASIC METABOLIC PANEL
Anion gap: 6 (ref 5–15)
BUN: 9 mg/dL (ref 6–20)
CHLORIDE: 104 mmol/L (ref 101–111)
CO2: 29 mmol/L (ref 22–32)
CREATININE: 0.87 mg/dL (ref 0.44–1.00)
Calcium: 9.8 mg/dL (ref 8.9–10.3)
GFR calc Af Amer: >60 mL/min (ref >60)
GFR calc non Af Amer: >60 mL/min (ref >60)
Glucose, Bld: 96 mg/dL (ref 65–99)
POTASSIUM: 4 mmol/L (ref 3.5–5.1)
Sodium: 139 mmol/L (ref 135–145)

## 2016-02-08 NOTE — Progress Notes (Signed)
PCP is Dr. Loura Pardon Denies ever seeing a cardiologist. Denies ever having a card cath, echo, Stress test.

## 2016-02-09 MED ORDER — CEFAZOLIN SODIUM-DEXTROSE 2-4 GM/100ML-% IV SOLN
2.0000 g | INTRAVENOUS | Status: AC
  Administered 2016-02-10: 2 g via INTRAVENOUS
  Filled 2016-02-09: qty 100

## 2016-02-10 ENCOUNTER — Ambulatory Visit (HOSPITAL_COMMUNITY): Payer: Managed Care, Other (non HMO) | Admitting: Certified Registered"

## 2016-02-10 ENCOUNTER — Ambulatory Visit (HOSPITAL_COMMUNITY): Payer: Managed Care, Other (non HMO)

## 2016-02-10 ENCOUNTER — Ambulatory Visit (HOSPITAL_COMMUNITY)
Admission: RE | Admit: 2016-02-10 | Discharge: 2016-02-10 | Disposition: A | Discharge status: Home / Self Care | Payer: Managed Care, Other (non HMO) | Source: Ambulatory Visit | Attending: General Surgery | Admitting: General Surgery

## 2016-02-10 ENCOUNTER — Encounter (HOSPITAL_COMMUNITY): Payer: Self-pay | Admitting: Surgery

## 2016-02-10 ENCOUNTER — Encounter (HOSPITAL_COMMUNITY)
Admission: RE | Disposition: A | Discharge status: Home / Self Care | Payer: Self-pay | Source: Ambulatory Visit | Attending: General Surgery

## 2016-02-10 DIAGNOSIS — K219 Gastro-esophageal reflux disease without esophagitis: Secondary | ICD-10-CM | POA: Insufficient documentation

## 2016-02-10 DIAGNOSIS — Z79899 Other long term (current) drug therapy: Secondary | ICD-10-CM | POA: Insufficient documentation

## 2016-02-10 DIAGNOSIS — K8020 Calculus of gallbladder without cholecystitis without obstruction: Secondary | ICD-10-CM

## 2016-02-10 DIAGNOSIS — K8010 Calculus of gallbladder with chronic cholecystitis without obstruction: Secondary | ICD-10-CM | POA: Diagnosis not present

## 2016-02-10 DIAGNOSIS — Z79890 Hormone replacement therapy (postmenopausal): Secondary | ICD-10-CM | POA: Insufficient documentation

## 2016-02-10 DIAGNOSIS — I10 Essential (primary) hypertension: Secondary | ICD-10-CM | POA: Insufficient documentation

## 2016-02-10 HISTORY — PX: CHOLECYSTECTOMY: SHX55

## 2016-02-10 SURGERY — LAPAROSCOPIC CHOLECYSTECTOMY WITH INTRAOPERATIVE CHOLANGIOGRAM
Anesthesia: General | Site: Abdomen

## 2016-02-10 MED ORDER — SUGAMMADEX SODIUM 200 MG/2ML IV SOLN
INTRAVENOUS | Status: AC
  Filled 2016-02-10: qty 2

## 2016-02-10 MED ORDER — SUGAMMADEX SODIUM 200 MG/2ML IV SOLN
INTRAVENOUS | Status: DC | PRN
  Administered 2016-02-10: 200 mg via INTRAVENOUS

## 2016-02-10 MED ORDER — HYDROMORPHONE HCL 1 MG/ML IJ SOLN
INTRAMUSCULAR | Status: DC
Start: 2016-02-10 — End: 2016-02-10
  Filled 2016-02-10: qty 1

## 2016-02-10 MED ORDER — OXYCODONE-ACETAMINOPHEN 5-325 MG PO TABS: 1.0000 | ORAL_TABLET | ORAL | Status: DC | PRN

## 2016-02-10 MED ORDER — FENTANYL CITRATE (PF) 250 MCG/5ML IJ SOLN
INTRAMUSCULAR | Status: AC
  Filled 2016-02-10: qty 5

## 2016-02-10 MED ORDER — MIDAZOLAM HCL 5 MG/5ML IJ SOLN
INTRAMUSCULAR | Status: DC | PRN
  Administered 2016-02-10: 2 mg via INTRAVENOUS

## 2016-02-10 MED ORDER — IOPAMIDOL (ISOVUE-300) INJECTION 61%
INTRAVENOUS | Status: DC | PRN
  Administered 2016-02-10: 100 mL

## 2016-02-10 MED ORDER — HYDROMORPHONE HCL 1 MG/ML IJ SOLN
0.2500 mg | INTRAMUSCULAR | Status: DC | PRN
  Administered 2016-02-10 (×4): 0.5 mg via INTRAVENOUS

## 2016-02-10 MED ORDER — OXYCODONE-ACETAMINOPHEN 5-325 MG PO TABS
1.0000 | ORAL_TABLET | Freq: Once | ORAL | Status: AC
  Administered 2016-02-10: 1 via ORAL

## 2016-02-10 MED ORDER — PROMETHAZINE HCL 25 MG/ML IJ SOLN
INTRAMUSCULAR | Status: AC
  Filled 2016-02-10: qty 1

## 2016-02-10 MED ORDER — FENTANYL CITRATE (PF) 250 MCG/5ML IJ SOLN
INTRAMUSCULAR | Status: DC | PRN
  Administered 2016-02-10: 100 ug via INTRAVENOUS
  Administered 2016-02-10 (×2): 50 ug via INTRAVENOUS

## 2016-02-10 MED ORDER — DIPHENHYDRAMINE HCL 50 MG/ML IJ SOLN
INTRAMUSCULAR | Status: DC | PRN
  Administered 2016-02-10: 10 mg via INTRAVENOUS

## 2016-02-10 MED ORDER — SODIUM CHLORIDE 0.9 % IR SOLN
Status: DC | PRN
  Administered 2016-02-10: 1000 mL

## 2016-02-10 MED ORDER — LIDOCAINE 2% (20 MG/ML) 5 ML SYRINGE
INTRAMUSCULAR | Status: DC | PRN
  Administered 2016-02-10: 60 mg via INTRAVENOUS

## 2016-02-10 MED ORDER — DEXAMETHASONE SODIUM PHOSPHATE 10 MG/ML IJ SOLN
INTRAMUSCULAR | Status: AC
  Filled 2016-02-10: qty 1

## 2016-02-10 MED ORDER — PROPOFOL 10 MG/ML IV BOLUS
INTRAVENOUS | Status: AC
  Filled 2016-02-10: qty 20

## 2016-02-10 MED ORDER — ONDANSETRON HCL 4 MG/2ML IJ SOLN
INTRAMUSCULAR | Status: DC | PRN
  Administered 2016-02-10: 4 mg via INTRAVENOUS

## 2016-02-10 MED ORDER — PROMETHAZINE HCL 25 MG/ML IJ SOLN
6.2500 mg | INTRAMUSCULAR | Status: DC | PRN
  Administered 2016-02-10: 6.25 mg via INTRAVENOUS

## 2016-02-10 MED ORDER — CHLORHEXIDINE GLUCONATE CLOTH 2 % EX PADS: 6.0000 | MEDICATED_PAD | Freq: Once | CUTANEOUS | Status: DC

## 2016-02-10 MED ORDER — ROCURONIUM BROMIDE 50 MG/5ML IV SOLN
INTRAVENOUS | Status: AC
  Filled 2016-02-10: qty 5

## 2016-02-10 MED ORDER — ROCURONIUM BROMIDE 100 MG/10ML IV SOLN
INTRAVENOUS | Status: DC | PRN
  Administered 2016-02-10: 10 mg via INTRAVENOUS
  Administered 2016-02-10: 40 mg via INTRAVENOUS

## 2016-02-10 MED ORDER — IOPAMIDOL (ISOVUE-300) INJECTION 61%
INTRAVENOUS | Status: AC
  Filled 2016-02-10: qty 50

## 2016-02-10 MED ORDER — LIDOCAINE 2% (20 MG/ML) 5 ML SYRINGE
INTRAMUSCULAR | Status: AC
  Filled 2016-02-10: qty 15

## 2016-02-10 MED ORDER — ONDANSETRON HCL 4 MG/2ML IJ SOLN
INTRAMUSCULAR | Status: AC
  Filled 2016-02-10: qty 2

## 2016-02-10 MED ORDER — PHENYLEPHRINE 40 MCG/ML (10ML) SYRINGE FOR IV PUSH (FOR BLOOD PRESSURE SUPPORT)
PREFILLED_SYRINGE | INTRAVENOUS | Status: AC
  Filled 2016-02-10: qty 10

## 2016-02-10 MED ORDER — MIDAZOLAM HCL 2 MG/2ML IJ SOLN
INTRAMUSCULAR | Status: AC
  Filled 2016-02-10: qty 2

## 2016-02-10 MED ORDER — 0.9 % SODIUM CHLORIDE (POUR BTL) OPTIME
TOPICAL | Status: DC | PRN
  Administered 2016-02-10: 1000 mL

## 2016-02-10 MED ORDER — PHENYLEPHRINE HCL 10 MG/ML IJ SOLN
INTRAMUSCULAR | Status: DC | PRN
  Administered 2016-02-10: 80 ug via INTRAVENOUS

## 2016-02-10 MED ORDER — PROMETHAZINE HCL 12.5 MG PO TABS: 12.5000 mg | ORAL_TABLET | Freq: Four times a day (QID) | ORAL | Status: DC | PRN

## 2016-02-10 MED ORDER — BUPIVACAINE-EPINEPHRINE (PF) 0.25% -1:200000 IJ SOLN
INTRAMUSCULAR | Status: AC
Start: 2016-02-10 — End: 2016-02-10
  Filled 2016-02-10: qty 30

## 2016-02-10 MED ORDER — DEXAMETHASONE SODIUM PHOSPHATE 10 MG/ML IJ SOLN
INTRAMUSCULAR | Status: DC | PRN
  Administered 2016-02-10: 4 mg via INTRAVENOUS

## 2016-02-10 MED ORDER — BUPIVACAINE-EPINEPHRINE 0.25% -1:200000 IJ SOLN
INTRAMUSCULAR | Status: DC | PRN
  Administered 2016-02-10: 30 mL

## 2016-02-10 MED ORDER — LACTATED RINGERS IV SOLN
INTRAVENOUS | Status: DC | PRN
  Administered 2016-02-10: 07:00:00 via INTRAVENOUS

## 2016-02-10 MED ORDER — LIDOCAINE 2% (20 MG/ML) 5 ML SYRINGE
INTRAMUSCULAR | Status: AC
  Filled 2016-02-10: qty 5

## 2016-02-10 MED ORDER — DIPHENHYDRAMINE HCL 50 MG/ML IJ SOLN
INTRAMUSCULAR | Status: AC
  Filled 2016-02-10: qty 1

## 2016-02-10 MED ORDER — PROPOFOL 10 MG/ML IV BOLUS
INTRAVENOUS | Status: DC | PRN
  Administered 2016-02-10: 125 mg via INTRAVENOUS

## 2016-02-10 MED ORDER — OXYCODONE-ACETAMINOPHEN 5-325 MG PO TABS
ORAL_TABLET | ORAL | Status: AC
  Administered 2016-02-10: 1 via ORAL
  Filled 2016-02-10: qty 1

## 2016-02-10 SURGICAL SUPPLY — 41 items
APPLIER CLIP 5 13 M/L LIGAMAX5 (MISCELLANEOUS) ×2
BLADE SURG ROTATE 9660 (MISCELLANEOUS) ×2 IMPLANT
CANISTER SUCTION 2500CC (MISCELLANEOUS) ×2 IMPLANT
CATH REDDICK CHOLANGI 4FR 50CM (CATHETERS) ×2 IMPLANT
CHLORAPREP W/TINT 26ML (MISCELLANEOUS) ×2 IMPLANT
CLIP APPLIE 5 13 M/L LIGAMAX5 (MISCELLANEOUS) ×1 IMPLANT
COVER MAYO STAND STRL (DRAPES) ×2 IMPLANT
COVER SURGICAL LIGHT HANDLE (MISCELLANEOUS) ×2 IMPLANT
DECANTER SPIKE VIAL GLASS SM (MISCELLANEOUS) ×2 IMPLANT
DRAPE C-ARM 42X72 X-RAY (DRAPES) ×2 IMPLANT
ELECT REM PT RETURN 9FT ADLT (ELECTROSURGICAL) ×2
ELECTRODE REM PT RTRN 9FT ADLT (ELECTROSURGICAL) ×1 IMPLANT
GLOVE BIO SURGEON STRL SZ 6.5 (GLOVE) ×2 IMPLANT
GLOVE BIO SURGEON STRL SZ7.5 (GLOVE) ×2 IMPLANT
GLOVE BIOGEL PI IND STRL 7.0 (GLOVE) ×1 IMPLANT
GLOVE BIOGEL PI IND STRL 8 (GLOVE) ×1 IMPLANT
GLOVE BIOGEL PI INDICATOR 7.0 (GLOVE) ×1
GLOVE BIOGEL PI INDICATOR 8 (GLOVE) ×1
GLOVE ECLIPSE 7.5 STRL STRAW (GLOVE) ×2 IMPLANT
GOWN STRL REUS W/ TWL LRG LVL3 (GOWN DISPOSABLE) ×2 IMPLANT
GOWN STRL REUS W/TWL 2XL LVL3 (GOWN DISPOSABLE) ×2 IMPLANT
GOWN STRL REUS W/TWL LRG LVL3 (GOWN DISPOSABLE) ×2
IV CATH 14GX2 1/4 (CATHETERS) ×2 IMPLANT
KIT BASIN OR (CUSTOM PROCEDURE TRAY) ×6 IMPLANT
KIT ROOM TURNOVER OR (KITS) ×2 IMPLANT
LIQUID BAND (GAUZE/BANDAGES/DRESSINGS) ×2 IMPLANT
NS IRRIG 1000ML POUR BTL (IV SOLUTION) ×2 IMPLANT
PAD ARMBOARD 7.5X6 YLW CONV (MISCELLANEOUS) ×4 IMPLANT
POUCH SPECIMEN RETRIEVAL 10MM (ENDOMECHANICALS) ×2 IMPLANT
SCISSORS LAP 5X35 DISP (ENDOMECHANICALS) ×2 IMPLANT
SET IRRIG TUBING LAPAROSCOPIC (IRRIGATION / IRRIGATOR) ×2 IMPLANT
SLEEVE ENDOPATH XCEL 5M (ENDOMECHANICALS) ×4 IMPLANT
SPECIMEN JAR MEDIUM (MISCELLANEOUS) ×2 IMPLANT
SPECIMEN JAR SMALL (MISCELLANEOUS) IMPLANT
SUT MNCRL AB 4-0 PS2 18 (SUTURE) ×4 IMPLANT
TOWEL OR 17X24 6PK STRL BLUE (TOWEL DISPOSABLE) ×2 IMPLANT
TOWEL OR 17X26 10 PK STRL BLUE (TOWEL DISPOSABLE) IMPLANT
TRAY LAPAROSCOPIC MC (CUSTOM PROCEDURE TRAY) ×2 IMPLANT
TROCAR XCEL BLUNT TIP 100MML (ENDOMECHANICALS) ×2 IMPLANT
TROCAR XCEL NON-BLD 5MMX100MML (ENDOMECHANICALS) ×2 IMPLANT
TUBING INSUFFLATION (TUBING) ×2 IMPLANT

## 2016-02-10 NOTE — Transfer of Care (Signed)
Immediate Anesthesia Transfer of Care Note  Patient: Makayla [name]  Procedure(s) Performed: Procedure(s): LAPAROSCOPIC CHOLECYSTECTOMY WITH INTRAOPERATIVE CHOLANGIOGRAM (N/A)  Patient Location: PACU  Anesthesia Type:General  Level of Consciousness: awake, oriented and patient cooperative  Airway & Oxygen Therapy: Patient Spontanous Breathing and Patient connected to nasal cannula oxygen  Post-op Assessment: Report given to RN, Post -op Vital signs reviewed and stable and Patient moving all extremities  Post vital signs: Reviewed and stable  Last Vitals:  Filed Vitals:   02/10/16 0633  BP: 132/74  Pulse: 90  Temp: 37 C  Resp: 20    Last Pain: There were no vitals filed for this visit.    Patients Stated Pain Goal: 6 ([id number])  Complications: No apparent anesthesia complications

## 2016-02-10 NOTE — Op Note (Signed)
02/10/2016  9:16 AM  PATIENT:  Makayla [name]  55 y.o. female  PRE-OPERATIVE DIAGNOSIS:  GALLSTONES  POST-OPERATIVE DIAGNOSIS:  GALLSTONES  PROCEDURE:  Procedure(s): LAPAROSCOPIC CHOLECYSTECTOMY WITH INTRAOPERATIVE CHOLANGIOGRAM (N/A)  SURGEON:  Surgeon(s) and Role:    * Jovita Kussmaul, MD - Primary  PHYSICIAN ASSISTANT:   ASSISTANTS: Jamie Sipsis, RN   ANESTHESIA:   general  EBL:     BLOOD ADMINISTERED:none  DRAINS: none   LOCAL MEDICATIONS USED:  MARCAINE     SPECIMEN:  Source of Specimen:  gallbladder  DISPOSITION OF SPECIMEN:  PATHOLOGY  COUNTS:  YES  TOURNIQUET:  * No tourniquets in log *  DICTATION: .Dragon Dictation   Procedure: After informed consent was obtained the patient was brought to the operating room and placed in the supine position on the operating room table. After adequate induction of general anesthesia the patient's abdomen was prepped with ChloraPrep allowed to dry and draped in usual sterile manner. The area below the umbilicus was infiltrated with quarter percent  Marcaine. A small incision was made with a 15 blade knife. The incision was carried down through the subcutaneous tissue bluntly with a hemostat and [employer] retractors. The linea alba was identified. The linea alba was incised with a 15 blade knife and each side was grasped with Coker clamps. The preperitoneal space was then probed with a hemostat until the peritoneum was opened and access was gained to the abdominal cavity. A 0 Vicryl pursestring stitch was placed in the fascia surrounding the opening. A Hassan cannula was then placed through the opening and anchored in place with the previously placed Vicryl purse string stitch. The abdomen was insufflated with carbon dioxide without difficulty. A laparoscope was inserted through the [facility] cannula in the right upper quadrant was inspected. Next the epigastric region was infiltrated with % Marcaine. A small incision was made with a 15 blade  knife. A 5 mm port was placed bluntly through this incision into the abdominal cavity under direct vision. Next 2 sites were chosen laterally on the right side of the abdomen for placement of 5 mm ports. Each of these areas was infiltrated with quarter percent Marcaine. Small stab incisions were made with a 15 blade knife. 5 mm ports were then placed bluntly through these incisions into the abdominal cavity under direct vision without difficulty. There were some omental adhesions along the midline abdominal wall which was taken down sharply with scissors. A blunt grasper was placed through the lateralmost 5 mm port and used to grasp the dome of the gallbladder and elevated anteriorly and superiorly. Another blunt grasper was placed through the other 5 mm port and used to retract the body and neck of the gallbladder. A dissector was placed through the epigastric port and using the electrocautery the peritoneal reflection at the gallbladder neck was opened. Blunt dissection was then carried out in this area until the gallbladder neck-cystic duct junction was readily identified and a good window was created. A single clip was placed on the gallbladder neck. A small  ductotomy was made just below the clip with laparoscopic scissors. A 14-gauge Angiocath was then placed through the anterior abdominal wall under direct vision. A Reddick cholangiogram catheter was then placed through the Angiocath and flushed. The catheter was then placed in the cystic duct and anchored in place with a clip. A cholangiogram was obtained that showed no filling defects good emptying into the duodenum an adequate length on the cystic duct. The anchoring clip and  catheters were then removed from the patient. 3 clips were placed proximally on the cystic duct and the duct was divided between the 2 sets of clips. Posterior to this the cystic artery was identified and again dissected bluntly in a circumferential manner until a good window  was  created. 2 clips were placed proximally and one distally on the artery and the artery was divided between the 2 sets of clips. Next a laparoscopic hook cautery device was used to separate the gallbladder from the liver bed. Prior to completely detaching the gallbladder from the liver bed the liver bed was inspected and several small bleeding points were coagulated with the electrocautery until the area was completely hemostatic. The gallbladder was then detached the rest of it from the liver bed without difficulty. A laparoscopic bag was inserted through the hassan port. The laparoscope was moved to the epigastric port. The gallbladder was placed within the bag and the bag was sealed.  The bag with the gallbladder was then removed with the [facility] cannula through the infraumbilical port without difficulty. The fascial defect was then closed with the previously placed Vicryl pursestring stitch as well as with another figure-of-eight 0 Vicryl stitch. The liver bed was inspected again and found to be hemostatic. The abdomen was irrigated with copious amounts of saline until the effluent was clear. The ports were then removed under direct vision without difficulty and were found to be hemostatic. The gas was allowed to escape. The skin incisions were all closed with interrupted 4-0 Monocryl subcuticular stitches. Dermabond dressings were applied. The patient tolerated the procedure well. At the end of the case all needle sponge and instrument counts were correct. The patient was then awakened and taken to recovery in stable condition  PLAN OF CARE: Discharge to home after PACU  PATIENT DISPOSITION:  PACU - hemodynamically stable.   Delay start of Pharmacological VTE agent (>24hrs) due to surgical blood loss or risk of bleeding: not applicable

## 2016-02-10 NOTE — Anesthesia Postprocedure Evaluation (Signed)
Anesthesia Post Note  Patient: Makayla [name]  Procedure(s) Performed: Procedure(s) (LRB): LAPAROSCOPIC CHOLECYSTECTOMY WITH INTRAOPERATIVE CHOLANGIOGRAM (N/A)  Patient location during evaluation: PACU Anesthesia Type: General Level of consciousness: awake and alert Pain management: pain level controlled Vital Signs Assessment: post-procedure vital signs reviewed and stable Respiratory status: spontaneous breathing, nonlabored ventilation, respiratory function stable and patient connected to nasal cannula oxygen Cardiovascular status: blood pressure returned to baseline and stable Postop Assessment: no signs of nausea or vomiting Anesthetic complications: no    Last Vitals:  Filed Vitals:   02/10/16 1240 02/10/16 1309  BP: 136/80 119/65  Pulse: 98 95  Temp:    Resp:      Last Pain:  Filed Vitals:   02/10/16 1310  PainSc: Asleep                 Garlen Reinig S

## 2016-02-10 NOTE — H&P (Addendum)
Makayla Makayla [name]. [facility]  Location: [facility] Surgery Patient #: [id number] DOB: [birth year] Married / Language: English / Race: White Female   History of Present Illness  Patient words: reck.  The patient is Makayla [name] 55 year old female who presents for Makayla [name] follow-up for Breast cancer. 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 She continues to do well and has no complaints today. She denies any breast pain. She denies any discharge from the nipple. She did recently have Makayla [name] mammogram that showed no evidence of malignancy. She also has known gallstones and recently had pain   Other Problems Gastroesophageal Reflux Disease Hypercholesterolemia  Past Surgical History  Breast Biopsy Left. multiple Hysterectomy (not due to cancer) - Partial  Diagnostic Studies History  Colonoscopy 1-5 years ago Mammogram within last year Pap Smear >5 years ago  Allergies  Lexapro *ANTIDEPRESSANTS*  Medication History  NexIUM (40MG  Packet, Oral) Active. Vitamin D (400UNIT Tablet, Oral) Active. Medications Reconciled  Social History Caffeine use Carbonated beverages, Coffee, Tea. No alcohol use No drug use Tobacco use Never smoker.  Family History  Arthritis Mother. Breast Cancer Mother. Cancer Mother. Depression Father. Diabetes Mellitus Mother. Heart disease in female family member before age 48 Hypertension Mother.  Pregnancy / Birth History  Age at menarche 39 years. Age of menopause 49-50 Contraceptive History Oral contraceptives. Gravida 1 Maternal age 2-25 Para 1    Review of Systems  General Not Present- Appetite Loss, Chills, Fatigue, Fever, Night Sweats, Weight Gain and Weight Loss. Skin Not Present- Change in Wart/Mole, Dryness, Hives, Jaundice, New Lesions, Non-Healing Wounds, Rash and Ulcer. HEENT Not Present- Earache, Hearing Loss, Hoarseness, Nose Bleed, Oral Ulcers, Ringing  in the Ears, Seasonal Allergies, Sinus Pain, Sore Throat, Visual Disturbances, Wears glasses/contact lenses and Yellow Eyes. Respiratory Not Present- Bloody sputum, Chronic Cough, Difficulty Breathing, Snoring and Wheezing. Breast Not Present- Breast Mass, Breast Pain, Nipple Discharge and Skin Changes. Cardiovascular Not Present- Chest Pain, Difficulty Breathing Lying Down, Leg Cramps, Palpitations, Rapid Heart Rate, Shortness of Breath and Swelling of Extremities. Gastrointestinal Present- Abdominal Pain. Not Present- Bloating, Bloody Stool, Change in Bowel Habits, Chronic diarrhea, Constipation, Difficulty Swallowing, Excessive gas, Gets full quickly at meals, Hemorrhoids, Indigestion, Nausea, Rectal Pain and Vomiting. Female Genitourinary Not Present- Frequency, Nocturia, Painful Urination, Pelvic Pain and Urgency. Musculoskeletal Not Present- Back Pain, Joint Pain, Joint Stiffness, Muscle Pain, Muscle Weakness and Swelling of Extremities. Neurological Not Present- Decreased Memory, Fainting, Headaches, Numbness, Seizures, Tingling, Tremor, Trouble walking and Weakness. Psychiatric Not Present- Anxiety, Bipolar, Change in Sleep Pattern, Depression, Fearful and Frequent crying. Endocrine Not Present- Cold Intolerance, Excessive Hunger, Hair Changes, Heat Intolerance, Hot flashes and New Diabetes. Hematology Not Present- Easy Bruising, Excessive bleeding, Gland problems, HIV and Persistent Infections.  Vitals Weight: 183.4 lb Height: 65in Body Surface Area: 1.95 m Body Mass Index: 30.52 kg/m  Temp.: 97.75F(Temporal)  Pulse: 81 (Regular)  BP: 128/80 (Sitting, Left Arm, Standard)     Physical Exam  General Mental Status-Alert. General Appearance-Consistent with stated age. Hydration-Well hydrated. Voice-Normal.  Head and Neck Head-normocephalic, atraumatic with no lesions or palpable masses. Trachea-midline. Thyroid Gland Characteristics - normal size and  consistency.  Eye Eyeball - Bilateral-Extraocular movements intact. Sclera/Conjunctiva - Bilateral-No scleral icterus.  Chest and Lung Exam Chest and lung exam reveals -quiet, even and easy respiratory effort with no use of accessory muscles and on auscultation, normal breath sounds, no adventitious sounds and normal vocal resonance. Inspection Chest  Wall - Normal. Back - normal.  Breast Note: The incision in the upper outer left breast has healed nicely. There is no palpable mass in either breast. There is no palpable axillary, supraclavicular, or cervical lymphadenopathy.   Cardiovascular Cardiovascular examination reveals -normal heart sounds, regular rate and rhythm with no murmurs and normal pedal pulses bilaterally.  Abdomen Inspection Inspection of the abdomen reveals - No Hernias. Skin - Scar - no surgical scars. Palpation/Percussion Palpation and Percussion of the abdomen reveal - Soft, Non Tender, No Rebound tenderness, No Rigidity (guarding) and No hepatosplenomegaly. Auscultation Auscultation of the abdomen reveals - Bowel sounds normal.  Neurologic Neurologic evaluation reveals -alert and oriented x 3 with no impairment of recent or remote memory. Mental Status-Normal.  Musculoskeletal Normal Exam - Left-Upper Extremity Strength Normal and Lower Extremity Strength Normal. Normal Exam - Right-Upper Extremity Strength Normal and Lower Extremity Strength Normal.  Lymphatic Head & Neck  General Head & Neck Lymphatics: Bilateral - Description - Normal. Axillary  General Axillary Region: Bilateral - Description - Normal. Tenderness - Non Tender. Femoral & Inguinal  Generalized Femoral & Inguinal Lymphatics: Bilateral - Description - Normal. Tenderness - Non Tender.    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 She  continues to do well. Her recent mammogram showed no evidence of malignancy. She will continue to do regular self exams. I will plan to see her back in 1 year. Current Plans Follow up in 1 year or as needed She was recently seen in ER with RUQ pain. She has known gallstones. Because of the risk of further painful episodes and possible pancreatitis I think she would benefit from having her gallbladder removed. I have discussed with her the risks and benefits of the surgery and she understands and wishes to proceed

## 2016-02-10 NOTE — Interval H&P Note (Signed)
History and Physical Interval Note:  02/10/2016 7:23 AM  Makayla [name]  has presented today for surgery, with the diagnosis of GALLSTONES  The various methods of treatment have been discussed with the patient and family. After consideration of risks, benefits and other options for treatment, the patient has consented to  Procedure(s): LAPAROSCOPIC CHOLECYSTECTOMY WITH INTRAOPERATIVE CHOLANGIOGRAM (N/A) as a surgical intervention .  The patient's history has been reviewed, patient examined, no change in status, stable for surgery.  I have reviewed the patient's chart and labs.  Questions were answered to the patient's satisfaction.     TOTH III,PAUL S

## 2016-02-10 NOTE — Anesthesia Preprocedure Evaluation (Signed)
Anesthesia Evaluation  Patient identified by MRN, date of birth, ID band Patient awake    Reviewed: Allergy & Precautions, NPO status , Patient's Chart, lab work & pertinent test results  Airway Mallampati: II  TM Distance: >3 FB Neck ROM: Full    Dental   Pulmonary neg pulmonary ROS,  breath sounds clear to auscultation        Cardiovascular hypertension, Rhythm:Regular Rate:Normal     Neuro/Psych    GI/Hepatic Neg liver ROS, GERD-  ,  Endo/Other  negative endocrine ROS  Renal/GU negative Renal ROS     Musculoskeletal   Abdominal   Peds  Hematology   Anesthesia Other Findings   Reproductive/Obstetrics                             Anesthesia Physical Anesthesia Plan  ASA: III  Anesthesia Plan: General   Post-op Pain Management:    Induction: Intravenous  Airway Management Planned: Oral ETT  Additional Equipment:   Intra-op Plan:   Post-operative Plan: Extubation in OR  Informed Consent: I have reviewed the patients History and Physical, chart, labs and discussed the procedure including the risks, benefits and alternatives for the proposed anesthesia with the patient or authorized representative who has indicated his/her understanding and acceptance.   Dental advisory given  Plan Discussed with: CRNA and Anesthesiologist  Anesthesia Plan Comments:         Anesthesia Quick Evaluation  

## 2016-02-10 NOTE — Anesthesia Procedure Notes (Signed)
Procedure Name: Intubation Date/Time: 02/10/2016 7:43 AM Performed by: Melina Copa, Torrence Branagan R Pre-anesthesia Checklist: Patient identified, Emergency Drugs available, Suction available and Patient being monitored Patient Re-evaluated:Patient Re-evaluated prior to inductionOxygen Delivery Method: Circle System Utilized Preoxygenation: Pre-oxygenation with 100% oxygen Intubation Type: IV induction Ventilation: Mask ventilation without difficulty Laryngoscope Size: Mac and 3 Grade View: Grade II Tube type: Oral Tube size: 7.5 mm Number of attempts: 1 Airway Equipment and Method: Stylet and Oral airway Placement Confirmation: ETT inserted through vocal cords under direct vision,  positive ETCO2 and breath sounds checked- equal and bilateral Secured at: 21 cm Tube secured with: Tape Dental Injury: Teeth and Oropharynx as per pre-operative assessment

## 2016-02-10 NOTE — Progress Notes (Signed)
Patient called at home to let her know. Dr. Marlou Starks would call in some anti-nausea medication to her pharmacy

## 2016-02-12 ENCOUNTER — Other Ambulatory Visit: Payer: Self-pay | Admitting: Family Medicine

## 2016-02-13 ENCOUNTER — Encounter (HOSPITAL_COMMUNITY): Payer: Self-pay | Admitting: General Surgery

## 2016-02-13 NOTE — Telephone Encounter (Signed)
Please refill times 3 -thanks 

## 2016-02-13 NOTE — Telephone Encounter (Signed)
Electronic refill request, please advise (I can't tell the last time it was filled)

## 2016-08-29 ENCOUNTER — Encounter: Payer: Self-pay | Admitting: Primary Care

## 2016-08-29 ENCOUNTER — Ambulatory Visit (INDEPENDENT_AMBULATORY_CARE_PROVIDER_SITE_OTHER): Payer: Managed Care, Other (non HMO) | Admitting: Primary Care

## 2016-08-29 VITALS — BP 132/84 | HR 85 | Temp 97.5°F | Ht 64.0 in | Wt 191.8 lb

## 2016-08-29 DIAGNOSIS — J069 Acute upper respiratory infection, unspecified: Secondary | ICD-10-CM

## 2016-08-29 MED ORDER — BENZONATATE 200 MG PO CAPS: 200.0000 mg | ORAL_CAPSULE | Freq: Three times a day (TID) | ORAL | 0 refills | Status: DC | PRN

## 2016-08-29 NOTE — Progress Notes (Signed)
Pre visit review using our clinic review tool, if applicable. No additional management support is needed unless otherwise documented below in the visit note. 

## 2016-08-29 NOTE — Patient Instructions (Signed)
Your symptoms are representative of a viral illness which will resolve on its own over time. Our goal is to treat your symptoms in order to aid your body in the healing process and to make you more comfortable.   You may take Benzonatate capsules for cough. Take 1 capsule by mouth three times daily as needed for cough.  Nasal Congestion/Ear Pressure: Try using Flonase (fluticasone) nasal spray. Instill 1 spray in each nostril twice daily.   Ensure you are staying hydrated and rest.  It was a pleasure meeting you!   Upper Respiratory Infection, Adult Most upper respiratory infections (URIs) are a viral infection of the air passages leading to the lungs. A URI affects the nose, throat, and upper air passages. The most common type of URI is nasopharyngitis and is typically referred to as "the common cold." URIs run their course and usually go away on their own. Most of the time, a URI does not require medical attention, but sometimes a bacterial infection in the upper airways can follow a viral infection. This is called a secondary infection. Sinus and middle ear infections are common types of secondary upper respiratory infections. Bacterial pneumonia can also complicate a URI. A URI can worsen asthma and chronic obstructive pulmonary disease (COPD). Sometimes, these complications can require emergency medical care and may be life threatening. What are the causes? Almost all URIs are caused by viruses. A virus is a type of germ and can spread from one person to another. What increases the risk? You may be at risk for a URI if:  You smoke.  You have chronic heart or lung disease.  You have a weakened defense (immune) system.  You are very young or very old.  You have nasal allergies or asthma.  You work in crowded or poorly ventilated areas.  You work in health care facilities or schools. What are the signs or symptoms? Symptoms typically develop 2-3 days after you come in contact with  a cold virus. Most viral URIs last 7-10 days. However, viral URIs from the influenza virus (flu virus) can last 14-18 days and are typically more severe. Symptoms may include:  Runny or stuffy (congested) nose.  Sneezing.  Cough.  Sore throat.  Headache.  Fatigue.  Fever.  Loss of appetite.  Pain in your forehead, behind your eyes, and over your cheekbones (sinus pain).  Muscle aches. How is this diagnosed? Your health care provider may diagnose a URI by:  Physical exam.  Tests to check that your symptoms are not due to another condition such as:  Strep throat.  Sinusitis.  Pneumonia.  Asthma. How is this treated? A URI goes away on its own with time. It cannot be cured with medicines, but medicines may be prescribed or recommended to relieve symptoms. Medicines may help:  Reduce your fever.  Reduce your cough.  Relieve nasal congestion. Follow these instructions at home:  Take medicines only as directed by your health care provider.  Gargle warm saltwater or take cough drops to comfort your throat as directed by your health care provider.  Use a warm mist humidifier or inhale steam from a shower to increase air moisture. This may make it easier to breathe.  Drink enough fluid to keep your urine clear or pale yellow.  Eat soups and other clear broths and maintain good nutrition.  Rest as needed.  Return to work when your temperature has returned to normal or as your health care provider advises. You may need to  stay home longer to avoid infecting others. You can also use a face mask and careful hand washing to prevent spread of the virus.  Increase the usage of your inhaler if you have asthma.  Do not use any tobacco products, including cigarettes, chewing tobacco, or electronic cigarettes. If you need help quitting, ask your health care provider. How is this prevented? The best way to protect yourself from getting a cold is to practice good  hygiene.  Avoid oral or hand contact with people with cold symptoms.  Wash your hands often if contact occurs. There is no clear evidence that vitamin C, vitamin E, echinacea, or exercise reduces the chance of developing a cold. However, it is always recommended to get plenty of rest, exercise, and practice good nutrition. Contact a health care provider if:  You are getting worse rather than better.  Your symptoms are not controlled by medicine.  You have chills.  You have worsening shortness of breath.  You have brown or red mucus.  You have yellow or brown nasal discharge.  You have pain in your face, especially when you bend forward.  You have a fever.  You have swollen neck glands.  You have pain while swallowing.  You have white areas in the back of your throat. Get help right away if:  You have severe or persistent:  Headache.  Ear pain.  Sinus pain.  Chest pain.  You have chronic lung disease and any of the following:  Wheezing.  Prolonged cough.  Coughing up blood.  A change in your usual mucus.  You have a stiff neck.  You have changes in your:  Vision.  Hearing.  Thinking.  Mood. This information is not intended to replace advice given to you by your health care provider. Make sure you discuss any questions you have with your health care provider. Document Released: 01/16/2001 Document Revised: 03/25/2016 Document Reviewed: 10/28/2013 Elsevier Interactive Patient Education  2017 [employer].

## 2016-08-29 NOTE — Progress Notes (Signed)
Subjective:    Patient ID: Makayla [name], female    DOB: 1960-12-29, 56 y.o.   MRN: [id number]  HPI  Makayla [name] is a 56 year old female who presents today with a chief complaint of cough. She also reports nasal congestion, sore throat. Her symptoms began 3 days ago. Her cough is non productive. She's taken tylenol, alka-selter plus, robitussin DM with some improvement. Overall she's feeling about the same.  Review of Systems  Constitutional: Positive for fatigue. Negative for chills and fever.  HENT: Positive for congestion, sinus pressure and sore throat. Negative for ear pain.   Respiratory: Positive for cough. Negative for shortness of breath.   Cardiovascular: Negative for chest pain.  Allergic/Immunologic: Negative for environmental allergies.       Past Medical History:  Diagnosis Date  . 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 Bursitis, trochanteric    bi/lat   . Dental crowns present    x 4  . GERD (gastroesophageal reflux disease)   . Hyperlipidemia    no current med.  . Hypertension    white coat syndrome,  . Mass of breast, left 01/2012  . Obesity      Social History   Social History  . Marital status: Married    Spouse name: N/A  . Number of children: 1  . Years of education: N/A   Occupational History  . RN    Social History Main Topics  . Smoking status: Never Smoker  . Smokeless tobacco: Never Used  . Alcohol use No  . Drug use: No  . Sexual activity: Not on file     Comment: has used estradiol patch 0.025  in the past, has stopped   Other Topics Concern  . Not on file   Social History Narrative  . No narrative on file    Past Surgical History:  Procedure Laterality Date  . ABDOMINAL HYSTERECTOMY     partial  . BREAST SURGERY  01/10/12   lumpectomy - left  . CHOLECYSTECTOMY N/A 02/10/2016   Procedure: LAPAROSCOPIC CHOLECYSTECTOMY WITH INTRAOPERATIVE  CHOLANGIOGRAM;  Surgeon: Autumn Messing III, MD;  Location: [facility];  Service: General;  Laterality: N/A;  . RECTOCELE REPAIR  06/14/2011   Procedure: POSTERIOR REPAIR (RECTOCELE);  Surgeon: Dutch Gray, MD;  Location: [facility];  Service: Urology;  Laterality: N/A;  cystoscopy, SPARC sling  . ROBOTIC ASSISTED LAPAROSCOPIC SACROCOLPOPEXY  06/14/2011   Procedure: ROBOTIC ASSISTED LAPAROSCOPIC SACROCOLPOPEXY;  Surgeon: Dutch Gray, MD;  Location: [facility];  Service: Urology;  Laterality: N/A;  . TUBAL LIGATION      Family History  Problem Relation Age of Onset  . Hypertension Mother   . Hyperlipidemia Mother   . Diabetes Mother   . Cancer Mother     uterine  . GER disease Father   . Depression Sister   . Heart disease Brother   . Heart attack Maternal Grandmother   . Hypertension Maternal Grandmother     No Known Allergies  Current Outpatient Prescriptions on File Prior to Visit  Medication Sig Dispense Refill  . b complex vitamins tablet Take 1 tablet by mouth daily.    Marland Kitchen CALCIUM PO Take 1 tablet by mouth daily.    Marland Kitchen ibuprofen (ADVIL,MOTRIN) 800 MG tablet TAKE ONE TABLET BY MOUTH EVERY 8 HOURS AS NEEDED FOR PAIN 30 tablet 3  . ondansetron (ZOFRAN ODT) 4 MG disintegrating tablet Take 1 tablet (4 mg total)  by mouth every 8 (eight) hours as needed for nausea or vomiting. 20 tablet 0  . promethazine (PHENERGAN) 12.5 MG tablet Take 1 tablet (12.5 mg total) by mouth every 6 (six) hours as needed for nausea. 20 tablet 0  . ranitidine (ZANTAC) 300 MG tablet Take 1 tablet (300 mg total) by mouth 2 (two) times daily. 180 tablet 3  . VITAMIN D, CHOLECALCIFEROL, PO Take 1 tablet by mouth daily.    . [DISCONTINUED] estradiol (VIVELLE-DOT) 0.025 MG/24HR Place 1 patch onto the skin 2 (two) times a week. Monday and friday     No current facility-administered medications on file prior to visit.     BP 132/84   Pulse 85   Temp 97.5 F (36.4 C) (Oral)   Ht 5\' 4"  (1.626 m)   Wt 191 lb 12.8 oz (87 kg)   SpO2  97%   BMI 32.92 kg/m    Objective:   Physical Exam  Constitutional: She appears well-nourished.  HENT:  Right Ear: Tympanic membrane and ear canal normal.  Left Ear: Tympanic membrane and ear canal normal.  Nose: Right sinus exhibits no maxillary sinus tenderness and no frontal sinus tenderness. Left sinus exhibits no maxillary sinus tenderness and no frontal sinus tenderness.  Mouth/Throat: Oropharynx is clear and moist.  Eyes: Conjunctivae are normal.  Neck: Neck supple.  Cardiovascular: Normal rate and regular rhythm.   Pulmonary/Chest: Effort normal and breath sounds normal. She has no wheezes. She has no rales.  Lymphadenopathy:    She has no cervical adenopathy.  Skin: Skin is warm and dry.          Assessment & Plan:  URI:  Cough, nasal congestion, fatigue x 3 days. Some improvement with OTC treatment. Exam today reassuring, no evidence of bacterial involvement, lungs clear. Suspect viral involvement and will treat with conservative measures. Rx for Tessalon peals sent to pharmacy. Discussed use of Flonase. Fluids, rest, follow up PRN.  Sheral Flow, NP

## 2016-10-25 ENCOUNTER — Emergency Department: Payer: Managed Care, Other (non HMO)

## 2016-10-25 ENCOUNTER — Emergency Department
Admission: EM | Admit: 2016-10-25 | Discharge: 2016-10-25 | Disposition: A | Discharge status: Home / Self Care | Payer: Managed Care, Other (non HMO) | Attending: Emergency Medicine | Admitting: Emergency Medicine

## 2016-10-25 ENCOUNTER — Encounter: Payer: Self-pay | Admitting: Emergency Medicine

## 2016-10-25 DIAGNOSIS — Z853 Personal history of malignant neoplasm of breast: Secondary | ICD-10-CM | POA: Diagnosis not present

## 2016-10-25 DIAGNOSIS — I10 Essential (primary) hypertension: Secondary | ICD-10-CM | POA: Insufficient documentation

## 2016-10-25 DIAGNOSIS — R079 Chest pain, unspecified: Secondary | ICD-10-CM | POA: Insufficient documentation

## 2016-10-25 LAB — CBC
HCT: 41.6 % (ref 35.0–47.0)
HEMOGLOBIN: 13.9 g/dL (ref 12.0–16.0)
MCH: 27.4 pg (ref 26.0–34.0)
MCHC: 33.4 g/dL (ref 32.0–36.0)
MCV: 82.2 fL (ref 80.0–100.0)
PLATELETS: 136 10*3/uL — AB (ref 150–440)
RBC: 5.06 MIL/uL (ref 3.80–5.20)
RDW: 14.6 % — ABNORMAL HIGH (ref 11.5–14.5)
WBC: 7.3 10*3/uL (ref 3.6–11.0)

## 2016-10-25 LAB — BASIC METABOLIC PANEL
ANION GAP: 8 (ref 5–15)
BUN: 13 mg/dL (ref 6–20)
CO2: 27 mmol/L (ref 22–32)
CREATININE: 0.83 mg/dL (ref 0.44–1.00)
Calcium: 9.3 mg/dL (ref 8.9–10.3)
Chloride: 103 mmol/L (ref 101–111)
Glucose, Bld: 129 mg/dL — ABNORMAL HIGH (ref 65–99)
Potassium: 3.3 mmol/L — ABNORMAL LOW (ref 3.5–5.1)
SODIUM: 138 mmol/L (ref 135–145)

## 2016-10-25 LAB — FIBRIN DERIVATIVES D-DIMER (ARMC ONLY): FIBRIN DERIVATIVES D-DIMER (ARMC): 209.93 (ref 0.00–499.00)

## 2016-10-25 LAB — TROPONIN I: Troponin I: <0.03 ng/mL (ref <0.03)

## 2016-10-25 MED ORDER — DIAZEPAM 5 MG PO TABS
5.0000 mg | ORAL_TABLET | Freq: Once | ORAL | Status: AC
  Administered 2016-10-25: 5 mg via ORAL
  Filled 2016-10-25: qty 1

## 2016-10-25 MED ORDER — LORAZEPAM 0.5 MG PO TABS: 0.5000 mg | ORAL_TABLET | Freq: Three times a day (TID) | ORAL | 0 refills | Status: DC | PRN

## 2016-10-25 NOTE — ED Provider Notes (Signed)
[facility] Emergency Department Provider Note        Time seen: ----------------------------------------- 7:11 AM on 10/25/2016 -----------------------------------------    I have reviewed the triage vital signs and the nursing notes.   HISTORY  Chief Complaint Chest Pain    HPI Makayla [name] is a 56 y.o. female who presents to the ER being brought by EMS for left-sided chest pain that does not radiate. Patient states the pain is just left of the midline starting around 5:45 AM. She describes it as an ache, nothing makes it better or worse. Aspirin and nitroglycerin was given prehospital by EMS. She has no prior cardiac history. She denies sweats, nausea, shortness of breath currently. She states when the pain started she did have shortness of breath.   Past Medical History:  Diagnosis Date  . 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 Bursitis, trochanteric    bi/lat   . Dental crowns present    x 4  . GERD (gastroesophageal reflux disease)   . Hyperlipidemia    no current med.  . Hypertension    white coat syndrome,  . Mass of breast, left 01/2012  . Obesity     Patient Active Problem List   Diagnosis Date Noted  . Boil of buttock 09/13/2015  . Left knee pain 09/13/2015  . Vitamin D deficiency 05/20/2015  . Obesity 05/20/2015  . Gallstones 02/02/2014  . RUQ pain 01/19/2014  . Perimenopause 02/03/2013  . Changeable mood ([place]) 02/03/2013  . 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 Breast cancer ([place]) 01/10/2012  . Other screening mammogram 10/25/2011  . Routine general medical examination at a health care facility 10/16/2011  . MENOPAUSAL SYNDROME 03/06/2010  . Hyperlipidemia LDL goal <130 12/04/2007  . GERD 12/04/2007  . ELEVATED BLOOD PRESSURE WITHOUT DIAGNOSIS OF HYPERTENSION 12/04/2007    Past  Surgical History:  Procedure Laterality Date  . ABDOMINAL HYSTERECTOMY     partial  . BREAST SURGERY  01/10/12   lumpectomy - left  . CHOLECYSTECTOMY N/A 02/10/2016   Procedure: LAPAROSCOPIC CHOLECYSTECTOMY WITH INTRAOPERATIVE CHOLANGIOGRAM;  Surgeon: Autumn Messing III, MD;  Location: [facility];  Service: General;  Laterality: N/A;  . RECTOCELE REPAIR  06/14/2011   Procedure: POSTERIOR REPAIR (RECTOCELE);  Surgeon: Dutch Gray, MD;  Location: [facility];  Service: Urology;  Laterality: N/A;  cystoscopy, SPARC sling  . ROBOTIC ASSISTED LAPAROSCOPIC SACROCOLPOPEXY  06/14/2011   Procedure: ROBOTIC ASSISTED LAPAROSCOPIC SACROCOLPOPEXY;  Surgeon: Dutch Gray, MD;  Location: [facility];  Service: Urology;  Laterality: N/A;  . TUBAL LIGATION      Allergies Patient has no known allergies.  Social History Social History  Substance Use Topics  . Smoking status: Never Smoker  . Smokeless tobacco: Never Used  . Alcohol use No    Review of Systems Constitutional: Negative for fever. Cardiovascular: Positive for chest pain Respiratory: Negative for shortness of breath. Gastrointestinal: Negative for abdominal pain, vomiting and diarrhea. Genitourinary: Negative for dysuria. Musculoskeletal: Negative for back pain. Skin: Negative for rash. Neurological: Negative for headaches, focal weakness or numbness.  10-point ROS otherwise negative.  ____________________________________________   PHYSICAL EXAM:  VITAL SIGNS: ED Triage Vitals  Enc Vitals Group     BP 10/25/16 0706 (!) 155/91     Pulse Rate 10/25/16 0706 (!) 113     Resp 10/25/16 0706 15     Temp 10/25/16 0706 98  F (36.7 C)     Temp Source 10/25/16 0706 Oral     SpO2 10/25/16 0706 96 %     Weight 10/25/16 0703 190 lb (86.2 kg)     Height 10/25/16 0703 5\' 3"  (1.6 m)     Head Circumference --      Peak Flow --      Pain Score 10/25/16 0703 5     Pain Loc --      Pain Edu? --      Excl. in [place]? --     Constitutional: Alert and oriented.  Anxious, no distress Eyes: Conjunctivae are normal. PERRL. Normal extraocular movements. ENT   Head: Normocephalic and atraumatic.   Nose: No congestion/rhinnorhea.   Mouth/Throat: Mucous membranes are moist.   Neck: No stridor. Cardiovascular: Normal rate, regular rhythm. No murmurs, rubs, or gallops. Respiratory: Normal respiratory effort without tachypnea nor retractions. Breath sounds are clear and equal bilaterally. No wheezes/rales/rhonchi. Gastrointestinal: Soft and nontender. Normal bowel sounds Musculoskeletal: Nontender with normal range of motion in all extremities. No lower extremity tenderness nor edema. Neurologic:  Normal speech and language. No gross focal neurologic deficits are appreciated.  Skin:  Skin is warm, dry and intact. No rash noted. Psychiatric: Mood and affect are normal. Speech and behavior are normal.  ____________________________________________  EKG: Interpreted by me. Sinus tachycardia rate of 115 bpm, normal PR interval, normal QRS, normal QT.  ____________________________________________  ED COURSE:  Pertinent labs & imaging results that were available during my care of the patient were reviewed by me and considered in my medical decision making (see chart for details). Patient presents to ER with chest pain that is likely noncardiac. We will assess with labs and imaging.   Procedures ____________________________________________   LABS (pertinent positives/negatives)  Labs Reviewed  BASIC METABOLIC PANEL - Abnormal; Notable for the following:       Result Value   Potassium 3.3 (*)    Glucose, Bld 129 (*)    All other components within normal limits  CBC - Abnormal; Notable for the following:    RDW 14.6 (*)    Platelets 136 (*)    All other components within normal limits  TROPONIN I  FIBRIN DERIVATIVES D-DIMER ([facility] ONLY)    RADIOLOGY Images were viewed by me  Chest x-ray IMPRESSION: No active  disease. ____________________________________________  FINAL ASSESSMENT AND PLAN  Chest pain  Plan: Patient with labs and imaging as dictated above. Patient is low risk for ACS with serial troponins that are negative here. Heart score reveals major adverse cardiac event risk as 1% in the next 6 weeks. She is stable for outpatient follow-up.   Earleen Newport, MD   Note: This note was generated in part or whole with voice recognition software. Voice recognition is usually quite accurate but there are transcription errors that can and very often do occur. I apologize for any typographical errors that were not detected and corrected.     Earleen Newport, MD 10/25/16 1017

## 2016-10-25 NOTE — ED Triage Notes (Signed)
Patient comes in from home via [facility] with left side chest pain that does not radiate. Pain started about 5:45am describes it as achy. No prior cardiac hx. EMS gave 324mg  aspirin and 1 spray of nitroglycerin.

## 2016-10-31 ENCOUNTER — Ambulatory Visit (INDEPENDENT_AMBULATORY_CARE_PROVIDER_SITE_OTHER): Payer: Managed Care, Other (non HMO) | Admitting: Family Medicine

## 2016-10-31 ENCOUNTER — Encounter: Payer: Self-pay | Admitting: Family Medicine

## 2016-10-31 VITALS — BP 128/76 | HR 89 | Temp 98.1°F | Ht 64.0 in | Wt 198.5 lb

## 2016-10-31 DIAGNOSIS — R0789 Other chest pain: Secondary | ICD-10-CM

## 2016-10-31 DIAGNOSIS — E6609 Other obesity due to excess calories: Secondary | ICD-10-CM

## 2016-10-31 DIAGNOSIS — E785 Hyperlipidemia, unspecified: Secondary | ICD-10-CM

## 2016-10-31 DIAGNOSIS — Z6834 Body mass index (BMI) 34.0-34.9, adult: Secondary | ICD-10-CM

## 2016-10-31 NOTE — Progress Notes (Signed)
Subjective:    Patient ID: Makayla [name], female    DOB: 1961/02/16, 56 y.o.   MRN: [id number]  HPI  Here for f/u of ED visit on 3/22 for L sided chest pain starting early in the am PMHx notable for hyperlipidemia/gerd and breast cancer   She still has a little discomfort - improved Has had chronic R chest wall pain - ? 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 Numbness radiating to the left arm and left neck. EXAM: PORTABLE CHEST 1 VIEW COMPARISON:  01/28/2016 FINDINGS: The heart size and mediastinal contours are within normal limits. Both lungs are clear. The visualized skeletal structures are unremarkable. IMPRESSION: No active disease. Electronically Signed   By: Nelson Chimes M.D.   On: 10/25/2016 08:41      EKG sinus tach rate of 115 with nl PR int, nl QRS and QT with no acute changes   Lab work: Results for orders placed or performed during the hospital encounter of 13/24/40  Basic metabolic panel  Result Value Ref Range   Sodium 138 135 - 145 mmol/L   Potassium 3.3 (L) 3.5 - 5.1 mmol/L   Chloride 103 101 - 111 mmol/L   CO2 27 22 - 32 mmol/L   Glucose, Bld 129 (H) 65 - 99 mg/dL   BUN 13 6 - 20 mg/dL   Creatinine, Ser 0.83 0.44 - 1.00 mg/dL   Calcium 9.3 8.9 - 10.3 mg/dL   GFR calc non Af Amer >60 >60 mL/min   GFR calc Af Amer >60 >60 mL/min   Anion gap 8 5 - 15    CBC  Result Value Ref Range   WBC 7.3 3.6 - 11.0 K/uL   RBC 5.06 3.80 - 5.20 MIL/uL   Hemoglobin 13.9 12.0 - 16.0 g/dL   HCT 41.6 35.0 - 47.0 %   MCV 82.2 80.0 - 100.0 fL   MCH 27.4 26.0 - 34.0 pg   MCHC 33.4 32.0 - 36.0 g/dL   RDW 14.6 (H) 11.5 - 14.5 %   Platelets 136 (L) 150 - 440 K/uL  Troponin I  Result Value Ref Range   Troponin I <0.03 <0.03 ng/mL  Fibrin derivatives D-Dimer ([facility] only)  Result Value Ref Range   Fibrin derivatives D-dimer ([facility]) 209.93 0.00 - 499.00  Troponin I  Result Value Ref Range   Troponin I <0.03 <0.03 ng/mL    Found to be low risk for ACS with neg serial troponins    Wt Readings from Last 3 Encounters:  10/31/16 198 lb 8 oz (90 kg)  10/25/16 190 lb (86.2 kg)  08/29/16 191 lb 12.8 oz (87 kg)  bmi is 34.0  Family history: PGM and GF had heart disease / MI  Puncle died of MI    Hx of hyperlipidemia Lab Results  Component Value Date   CHOL 192 05/16/2015   HDL 58.60 05/16/2015   LDLCALC 118 (H) 05/16/2015   TRIG 78.0 05/16/2015   CHOLHDL 3 05/16/2015  pt states she had a good biometric screen HDL/LDL at work   BP Readings from Last 3 Encounters:  10/31/16 128/76  10/25/16 (!) 159/85  08/29/16 132/84   Pulse Readings from Last 3 Encounters:  10/31/16 89  10/25/16 (!) 112  08/29/16 85   Patient Active Problem List   Diagnosis Date Noted  . Boil of buttock 09/13/2015  . Left knee pain 09/13/2015  . Vitamin D deficiency 05/20/2015  . Obesity 05/20/2015  . Gallstones 02/02/2014  . RUQ pain 01/19/2014  . Perimenopause 02/03/2013  . Changeable mood ([place]) 02/03/2013  . 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 Breast cancer ([place]) 01/10/2012  . Other screening mammogram 10/25/2011  . Routine general medical examination at a health care facility 10/16/2011  . MENOPAUSAL SYNDROME 03/06/2010  . Hyperlipidemia LDL goal <130 12/04/2007  . GERD 12/04/2007  . ELEVATED BLOOD PRESSURE  WITHOUT DIAGNOSIS OF HYPERTENSION 12/04/2007   Past Medical History:  Diagnosis Date  . Breast cancer ([place]) 01/10/12   Left breast lumpectomy=phyllodes tumor,boederline 3.1cm,margins neg.,for atypia or malignancy  . Bursitis, trochanteric    bi/lat   . Dental crowns present    x 4  . GERD (gastroesophageal reflux disease)   . Hyperlipidemia    no current med.  . Hypertension    white coat syndrome,  . Mass of breast, left 01/2012  . Obesity    Past Surgical History:  Procedure Laterality Date  . ABDOMINAL HYSTERECTOMY     partial  . BREAST SURGERY  01/10/12   lumpectomy - left  . CHOLECYSTECTOMY N/A 02/10/2016   Procedure: LAPAROSCOPIC CHOLECYSTECTOMY WITH INTRAOPERATIVE CHOLANGIOGRAM;  Surgeon: Autumn Messing III, MD;  Location: [facility];  Service: General;  Laterality: N/A;  . RECTOCELE REPAIR  06/14/2011   Procedure: POSTERIOR REPAIR (RECTOCELE);  Surgeon: Dutch Gray, MD;  Location: [facility];  Service: Urology;  Laterality: N/A;  cystoscopy, SPARC sling  . ROBOTIC ASSISTED LAPAROSCOPIC SACROCOLPOPEXY  06/14/2011   Procedure: ROBOTIC ASSISTED LAPAROSCOPIC SACROCOLPOPEXY;  Surgeon: Dutch Gray, MD;  Location: [facility];  Service: Urology;  Laterality: N/A;  . TUBAL LIGATION     Social History  Substance Use Topics  . Smoking status: Never Smoker  . Smokeless tobacco: Never Used  . Alcohol use No   Family History  Problem Relation Age of Onset  . Hypertension Mother   . Hyperlipidemia Mother   . Diabetes Mother   . Cancer Mother     uterine  . GER disease Father   . Depression Sister   . Heart disease Brother   . Heart attack Maternal Grandmother   . Hypertension Maternal Grandmother    No Known Allergies Current Outpatient Prescriptions on File Prior to Visit  Medication Sig Dispense Refill  . b complex vitamins tablet Take 1 tablet by mouth daily.    Marland Kitchen CALCIUM PO Take 600 mg by mouth daily.     Marland Kitchen ibuprofen (ADVIL,MOTRIN) 800 MG tablet TAKE ONE TABLET BY MOUTH EVERY 8 HOURS AS  NEEDED FOR PAIN 30 tablet 3  . ranitidine (ZANTAC) 300 MG tablet Take 1 tablet (300 mg total) by mouth 2 (two) times daily. 180 tablet 3  . VITAMIN D, CHOLECALCIFEROL, PO Take 1  tablet by mouth daily.    Marland Kitchen LORazepam (ATIVAN) 0.5 MG tablet Take 1 tablet (0.5 mg total) by mouth every 8 (eight) hours as needed for anxiety. (Patient not taking: Reported on 10/31/2016) 30 tablet 0  . [DISCONTINUED] estradiol (VIVELLE-DOT) 0.025 MG/24HR Place 1 patch onto the skin 2 (two) times a week. Monday and friday     No current facility-administered medications on file prior to visit.     Review of Systems Review of Systems  Constitutional: Negative for fever, appetite change, fatigue and unexpected weight change.  Eyes: Negative for pain and visual disturbance.  Respiratory: Negative for cough and shortness of breath.   Cardiovascular: Negative for palpitations/pedal edema/sob on exertion/pnd or orthopnea  Gastrointestinal: Negative for nausea, diarrhea and constipation.  Genitourinary: Negative for urgency and frequency.  Skin: Negative for pallor or rash   Neurological: Negative for weakness, light-headedness, numbness and headaches.  Hematological: Negative for adenopathy. Does not bruise/bleed easily.  Psychiatric/Behavioral: Negative for dysphoric mood. The patient is not nervous/anxious.         Objective:   Physical Exam  Constitutional: She appears well-developed and well-nourished. No distress.  obese and well appearing   HENT:  Head: Normocephalic and atraumatic.  Mouth/Throat: Oropharynx is clear and moist.  Eyes: Conjunctivae and EOM are normal. Pupils are equal, round, and reactive to light.  Neck: Normal range of motion. Neck supple. No JVD present. Carotid bruit is not present. No thyromegaly present.  Cardiovascular: Normal rate, regular rhythm, normal heart sounds and intact distal pulses.  Exam reveals no gallop.   Pulmonary/Chest: Effort normal and breath sounds normal. No  respiratory distress. She has no wheezes. She has no rales. She exhibits no tenderness.  No crackles  Abdominal: Soft. Bowel sounds are normal. She exhibits no distension, no abdominal bruit and no mass. There is no hepatosplenomegaly. There is no tenderness. There is no rebound, no guarding, no CVA tenderness, no tenderness at McBurney's point and negative Murphy's sign.  Musculoskeletal: She exhibits no edema.  Lymphadenopathy:    She has no cervical adenopathy.  Neurological: She is alert. She has normal reflexes.  Skin: Skin is warm and dry. No rash noted. No pallor.  Psychiatric: She has a normal mood and affect.          Assessment & Plan:   Problem List Items Addressed This Visit      Other   Chest pain    Atypical -initially with some rapid HR and tingling in hands  Reviewed hospital records, lab results and studies in detail   Reassuring cardiac w/u with ekg/ enzymes and cxr  Nl exam today  Disc stress as a factor  Disc cardiac risk factors Low threshold to stress test if symptoms return or persist       Hyperlipidemia LDL goal <130    Disc goals for lipids and reasons to control them Rev labs with pt Rev low sat fat diet in detail       Obesity - Primary    Discussed how this problem influences overall health and the risks it imposes  Reviewed plan for weight loss with lower calorie diet (via better food choices and also portion control or program like weight watchers) and exercise building up to or more than 30 minutes 5 days per week including some aerobic activity

## 2016-10-31 NOTE — Progress Notes (Signed)
Pre visit review using our clinic review tool, if applicable. No additional management support is needed unless otherwise documented below in the visit note. 

## 2016-10-31 NOTE — Patient Instructions (Addendum)
Adjust your schedule to be healthy to you  Keep up a good fluids and balanced diet  Exercise 30 or more minutes a day  Talk to the people who support you  Your ER visit results are re assuring   We can plan a stress test if symptoms change/persist- let me know   Cutting caffeine is a good idea   Take care of yourself

## 2016-11-02 DIAGNOSIS — R079 Chest pain, unspecified: Secondary | ICD-10-CM | POA: Insufficient documentation

## 2016-11-02 NOTE — Assessment & Plan Note (Signed)
Discussed how this problem influences overall health and the risks it imposes  Reviewed plan for weight loss with lower calorie diet (via better food choices and also portion control or program like weight watchers) and exercise building up to or more than 30 minutes 5 days per week including some aerobic activity    

## 2016-11-02 NOTE — Assessment & Plan Note (Addendum)
Atypical -initially with some rapid HR and tingling in hands  Reviewed hospital records, lab results and studies in detail   Reassuring cardiac w/u with ekg/ enzymes and cxr  Nl exam today  Disc stress as a factor  Disc cardiac risk factors Low threshold to stress test if symptoms return or persist

## 2016-11-02 NOTE — Assessment & Plan Note (Signed)
Disc goals for lipids and reasons to control them Rev labs with pt Rev low sat fat diet in detail   

## 2017-01-08 ENCOUNTER — Ambulatory Visit: Payer: Self-pay | Admitting: Physician Assistant

## 2017-01-08 ENCOUNTER — Encounter: Payer: Self-pay | Admitting: Physician Assistant

## 2017-01-08 VITALS — BP 138/88 | HR 79 | Temp 97.6°F

## 2017-01-08 DIAGNOSIS — W57XXXA Bitten or stung by nonvenomous insect and other nonvenomous arthropods, initial encounter: Secondary | ICD-10-CM

## 2017-01-08 MED ORDER — DOXYCYCLINE HYCLATE 100 MG PO TABS: 100.0000 mg | ORAL_TABLET | Freq: Two times a day (BID) | ORAL | 0 refills | Status: DC

## 2017-01-08 NOTE — Progress Notes (Signed)
S: c/o tick bite to side of neck about 10 days ago, now area is red, swollen, and itcy, no fever/chills, but just doesn't feel well, has some nausea, no v/d, no known headache  O: vitals wnl, nad, skin with red swollen area at bite site, no pustules or drainage, no bulls eye noted, + swollen posterior cervical node behind the same area, neck supple, lungs c t a,c v rrr  A: tick bite  P: doxy 100mg  bid, lymes, rmsf titers

## 2017-01-10 LAB — ROCKY MTN SPOTTED FVR AB, IGM-BLOOD: RMSF IgM: 0.13 index (ref 0.00–0.89)

## 2017-01-10 LAB — ROCKY MTN SPOTTED FVR AB, IGG-BLOOD: RMSF IGG: NEGATIVE

## 2017-01-10 LAB — B. BURGDORFI ANTIBODIES

## 2017-01-13 ENCOUNTER — Telehealth: Payer: Self-pay | Admitting: Family Medicine

## 2017-01-13 DIAGNOSIS — E559 Vitamin D deficiency, unspecified: Secondary | ICD-10-CM

## 2017-01-13 DIAGNOSIS — Z0000 Encounter for general adult medical examination without abnormal findings: Secondary | ICD-10-CM

## 2017-01-13 NOTE — Telephone Encounter (Signed)
-----   Message from Marchia Bond sent at 01/10/2017  1:43 PM EDT ----- Regarding: Cpx lab Mon 6/18, need orders. Thanks! :-) Please order  future cpx labs for pt's upcoming lab appt. Thanks Aniceto Boss

## 2017-01-21 ENCOUNTER — Other Ambulatory Visit (INDEPENDENT_AMBULATORY_CARE_PROVIDER_SITE_OTHER): Payer: Managed Care, Other (non HMO)

## 2017-01-21 DIAGNOSIS — Z0000 Encounter for general adult medical examination without abnormal findings: Secondary | ICD-10-CM

## 2017-01-21 DIAGNOSIS — E559 Vitamin D deficiency, unspecified: Secondary | ICD-10-CM | POA: Diagnosis not present

## 2017-01-21 LAB — CBC WITH DIFFERENTIAL/PLATELET
BASOS ABS: 0.1 10*3/uL (ref 0.0–0.1)
Basophils Relative: 0.7 % (ref 0.0–3.0)
Eosinophils Absolute: 0.2 10*3/uL (ref 0.0–0.7)
Eosinophils Relative: 2.8 % (ref 0.0–5.0)
HEMATOCRIT: 41.8 % (ref 36.0–46.0)
Hemoglobin: 13.6 g/dL (ref 12.0–15.0)
LYMPHS PCT: 30 % (ref 12.0–46.0)
Lymphs Abs: 2.4 10*3/uL (ref 0.7–4.0)
MCHC: 32.5 g/dL (ref 30.0–36.0)
MCV: 83.5 fl (ref 78.0–100.0)
MONOS PCT: 8.6 % (ref 3.0–12.0)
Monocytes Absolute: 0.7 10*3/uL (ref 0.1–1.0)
Neutro Abs: 4.7 10*3/uL (ref 1.4–7.7)
Neutrophils Relative %: 57.9 % (ref 43.0–77.0)
Platelets: 179 10*3/uL (ref 150.0–400.0)
RBC: 5.01 Mil/uL (ref 3.87–5.11)
RDW: 14.4 % (ref 11.5–15.5)
WBC: 8.1 10*3/uL (ref 4.0–10.5)

## 2017-01-21 LAB — LIPID PANEL
Cholesterol: 194 mg/dL (ref 0–200)
HDL: 53.8 mg/dL (ref >39.00)
LDL Cholesterol: 125 mg/dL — ABNORMAL HIGH (ref 0–99)
NONHDL: 139.89
TRIGLYCERIDES: 76 mg/dL (ref 0.0–149.0)
Total CHOL/HDL Ratio: 4
VLDL: 15.2 mg/dL (ref 0.0–40.0)

## 2017-01-21 LAB — COMPREHENSIVE METABOLIC PANEL
ALK PHOS: 80 U/L (ref 39–117)
ALT: 12 U/L (ref 0–35)
AST: 13 U/L (ref 0–37)
Albumin: 4 g/dL (ref 3.5–5.2)
BILIRUBIN TOTAL: 0.4 mg/dL (ref 0.2–1.2)
BUN: 15 mg/dL (ref 6–23)
CALCIUM: 9.6 mg/dL (ref 8.4–10.5)
CO2: 31 mEq/L (ref 19–32)
Chloride: 105 mEq/L (ref 96–112)
Creatinine, Ser: 0.8 mg/dL (ref 0.40–1.20)
GFR: 78.63 mL/min (ref >60.00)
Glucose, Bld: 101 mg/dL — ABNORMAL HIGH (ref 70–99)
Potassium: 4 mEq/L (ref 3.5–5.1)
Sodium: 140 mEq/L (ref 135–145)
TOTAL PROTEIN: 6.4 g/dL (ref 6.0–8.3)

## 2017-01-21 LAB — VITAMIN D 25 HYDROXY (VIT D DEFICIENCY, FRACTURES): VITD: 24.26 ng/mL — AB (ref 30.00–100.00)

## 2017-01-21 LAB — TSH: TSH: 2.68 u[IU]/mL (ref 0.35–4.50)

## 2017-01-23 ENCOUNTER — Encounter: Payer: Self-pay | Admitting: Family Medicine

## 2017-01-23 ENCOUNTER — Ambulatory Visit (INDEPENDENT_AMBULATORY_CARE_PROVIDER_SITE_OTHER): Payer: Managed Care, Other (non HMO) | Admitting: Family Medicine

## 2017-01-23 VITALS — BP 128/70 | HR 83 | Temp 97.3°F | Ht 64.0 in | Wt 199.5 lb

## 2017-01-23 DIAGNOSIS — E785 Hyperlipidemia, unspecified: Secondary | ICD-10-CM | POA: Diagnosis not present

## 2017-01-23 DIAGNOSIS — Z853 Personal history of malignant neoplasm of breast: Secondary | ICD-10-CM

## 2017-01-23 DIAGNOSIS — D4862 Neoplasm of uncertain behavior of left breast: Secondary | ICD-10-CM

## 2017-01-23 DIAGNOSIS — E559 Vitamin D deficiency, unspecified: Secondary | ICD-10-CM | POA: Diagnosis not present

## 2017-01-23 DIAGNOSIS — F411 Generalized anxiety disorder: Secondary | ICD-10-CM | POA: Insufficient documentation

## 2017-01-23 DIAGNOSIS — E6609 Other obesity due to excess calories: Secondary | ICD-10-CM

## 2017-01-23 DIAGNOSIS — F419 Anxiety disorder, unspecified: Secondary | ICD-10-CM

## 2017-01-23 DIAGNOSIS — Z0000 Encounter for general adult medical examination without abnormal findings: Secondary | ICD-10-CM

## 2017-01-23 DIAGNOSIS — Z6834 Body mass index (BMI) 34.0-34.9, adult: Secondary | ICD-10-CM

## 2017-01-23 DIAGNOSIS — E66811 Obesity, class 1: Secondary | ICD-10-CM

## 2017-01-23 MED ORDER — RANITIDINE HCL 300 MG PO TABS: 300.0000 mg | ORAL_TABLET | Freq: Two times a day (BID) | ORAL | 3 refills | Status: DC

## 2017-01-23 MED ORDER — ERGOCALCIFEROL 1.25 MG (50000 UT) PO CAPS: 50000.0000 [IU] | ORAL_CAPSULE | ORAL | 0 refills | Status: DC

## 2017-01-23 NOTE — Patient Instructions (Addendum)
You are due for a 10 year Tdap this year -make sure to get it at work   Don't forget to schedule your mammogram   For vitamin D -continue 3000 iu daily and take the weekly high dose for 12 weeks   You may be a candidate for the new shingles vaccine (Shingrix) when it is available  We will discuss it again in the future   Get back to self care when you have more time - I suspect your fatigue will improve

## 2017-01-23 NOTE — Progress Notes (Signed)
Subjective:    Patient ID: Makayla [name], female    DOB: 08-15-1960, 56 y.o.   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  Is happy about that   Wt Readings from Last 3 Encounters:  01/23/17 199 lb 8 oz (90.5 kg)  10/31/16 198 lb 8 oz (90 kg)  10/25/16 190 lb (86.2 kg)  wt went up in march- her job was tearing her up / anxiety and not eating right  No time for self care for the past 6-8 mo  Feels more calm now that she has plan to change jobs  bmi 34.2  Tetanus shot 9/08-will get Tdap at work   Mammogram 6/17-negative -has letter to make an appt  Self breast exam -no lumps  Hx of phylolodes tumor  Has had a hysterectomy and rectocele repair   No gyn problems since her surgery   Colonoscopy 9/11 - nl with 10 year recall   Vit D level 24.2-still too low  She was taking 3000 iu daily   Zoster status - has not had shingles/ may consider shingrix   BP BP Readings from Last 3 Encounters:  01/23/17 128/70  01/08/17 138/88  10/31/16 128/76     Hyperlipidemia Lab Results  Component Value Date   CHOL 194 01/21/2017   CHOL 192 05/16/2015   CHOL 198 05/11/2014   Lab Results  Component Value Date   HDL 53.80 01/21/2017   HDL 58.60 05/16/2015   HDL 54.90 05/11/2014   Lab Results  Component Value Date   LDLCALC 125 (H) 01/21/2017   LDLCALC 118 (H) 05/16/2015   [place] 126 (H) 05/11/2014   Lab Results  Component Value Date   TRIG 76.0 01/21/2017   TRIG 78.0 05/16/2015   TRIG 85.0 05/11/2014   Lab Results  Component Value Date   CHOLHDL 4 01/21/2017   CHOLHDL 3 05/16/2015   CHOLHDL 4 05/11/2014   No results found for: LDLDIRECT Fairly stable Diet is variable - plans to improve   Started to walk again-knees are bothering her  Has had a torn meniscus in the past  Still takes the stairs at work   Anxiety is improving now that she looks  forward to change in job  She does not use her lorazepam  Does not think she needs help Has good support   Results for orders placed or performed in visit on 01/21/17  CBC with Differential/Platelet  Result Value Ref Range   WBC 8.1 4.0 - 10.5 K/uL   RBC 5.01 3.87 - 5.11 Mil/uL   Hemoglobin 13.6 12.0 - 15.0 g/dL   HCT 41.8 36.0 - 46.0 %   MCV 83.5 78.0 - 100.0 fl   MCHC 32.5 30.0 - 36.0 g/dL   RDW 14.4 11.5 - 15.5 %   Platelets 179.0 150.0 - 400.0 K/uL   Neutrophils Relative % 57.9 43.0 - 77.0 %   Lymphocytes Relative 30.0 12.0 - 46.0 %   Monocytes Relative 8.6 3.0 - 12.0 %   Eosinophils Relative 2.8 0.0 - 5.0 %   Basophils Relative 0.7 0.0 - 3.0 %   Neutro Abs 4.7 1.4 - 7.7 K/uL   Lymphs Abs 2.4 0.7 - 4.0 K/uL   Monocytes Absolute 0.7 0.1 - 1.0 K/uL   Eosinophils Absolute 0.2 0.0 - 0.7 K/uL   Basophils Absolute 0.1 0.0 - 0.1 K/uL  Comprehensive  metabolic panel  Result Value Ref Range   Sodium 140 135 - 145 mEq/L   Potassium 4.0 3.5 - 5.1 mEq/L   Chloride 105 96 - 112 mEq/L   CO2 31 19 - 32 mEq/L   Glucose, Bld 101 (H) 70 - 99 mg/dL   BUN 15 6 - 23 mg/dL   Creatinine, Ser 0.80 0.40 - 1.20 mg/dL   Total Bilirubin 0.4 0.2 - 1.2 mg/dL   Alkaline Phosphatase 80 39 - 117 U/L   AST 13 0 - 37 U/L   ALT 12 0 - 35 U/L   Total Protein 6.4 6.0 - 8.3 g/dL   Albumin 4.0 3.5 - 5.2 g/dL   Calcium 9.6 8.4 - 10.5 mg/dL   GFR 78.63 >60.00 mL/min  Lipid panel  Result Value Ref Range   Cholesterol 194 0 - 200 mg/dL   Triglycerides 76.0 0.0 - 149.0 mg/dL   HDL 53.80 >39.00 mg/dL   VLDL 15.2 0.0 - 40.0 mg/dL   LDL Cholesterol 125 (H) 0 - 99 mg/dL   Total CHOL/HDL Ratio 4    NonHDL 139.89   TSH  Result Value Ref Range   TSH 2.68 0.35 - 4.50 uIU/mL  VITAMIN D 25 Hydroxy (Vit-D Deficiency, Fractures)  Result Value Ref Range   VITD 24.26 (L) 30.00 - 100.00 ng/mL     Review of Systems Review of Systems  Constitutional: Negative for fever, appetite change, and unexpected weight  change.  Eyes: Negative for pain and visual disturbance.  Respiratory: Negative for cough and shortness of breath.   Cardiovascular: Negative for cp or palpitations    Gastrointestinal: Negative for nausea, diarrhea and constipation.  Genitourinary: Negative for urgency and frequency.  Skin: Negative for pallor or rash   Neurological: Negative for weakness, light-headedness, numbness and headaches.  Hematological: Negative for adenopathy. Does not bruise/bleed easily.  Psychiatric/Behavioral: Negative for dysphoric mood. Pos for anx mood that is improving   pos for stressors that are improving        Objective:   Physical Exam  Constitutional: She appears well-developed and well-nourished. No distress.  obese and well appearing   HENT:  Head: Normocephalic and atraumatic.  Right Ear: External ear normal.  Left Ear: External ear normal.  Mouth/Throat: Oropharynx is clear and moist.  Eyes: Conjunctivae and EOM are normal. Pupils are equal, round, and reactive to light. No scleral icterus.  Neck: Normal range of motion. Neck supple. No JVD present. Carotid bruit is not present. No thyromegaly present.  Cardiovascular: Normal rate, regular rhythm, normal heart sounds and intact distal pulses.  Exam reveals no gallop.   Pulmonary/Chest: Effort normal and breath sounds normal. No respiratory distress. She has no wheezes. She exhibits no tenderness.  Abdominal: Soft. Bowel sounds are normal. She exhibits no distension, no abdominal bruit and no mass. There is no tenderness.  Baseline scars from abd surgeries   Genitourinary: No breast swelling, tenderness, discharge or bleeding.  Genitourinary Comments: Breast exam: No mass, nodules, thickening, tenderness, bulging, retraction, inflamation, nipple discharge or skin changes noted.  No axillary or clavicular LA.      Musculoskeletal: Normal range of motion. She exhibits no edema or tenderness.  Lymphadenopathy:    She has no cervical  adenopathy.  Neurological: She is alert. She has normal reflexes. No cranial nerve deficit. She exhibits normal muscle tone. Coordination normal.  Skin: Skin is warm and dry. No rash noted. No erythema. No pallor.  Fair with some lentigines  Psychiatric: She has a  normal mood and affect.          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  She greatly dislikes current position [employer] now  Feels like she will be able to return to good self care Offered counseling at any time if she wants it       History of malignant phylloides tumor of breast    Continue yearly mammograms and exams  Nl exam today      Hyperlipidemia LDL goal <130    Disc goals for lipids and reasons to control them Rev labs with pt Rev low sat fat diet in detail  Stable HDL in the 50s LDL 125 Disc low sat/trans fat diet       Obesity    Discussed how this problem influences overall health and the risks it imposes  Reviewed plan for weight loss with lower calorie diet (via better food choices and also portion control or program like weight watchers) and exercise building up to or more than 30 minutes 5 days per week including some aerobic activity         RESOLVED: Phyllodes tumor    Continue yearly mammograms and exams        Routine general medical examination at a health care facility    Reviewed health habits including diet and exercise and skin cancer prevention Reviewed appropriate screening tests for age  Also reviewed health mt list, fam hx and immunization status , as well as social and family history   See HPI Labs reviewed You are due for a 10 year Tdap this year -make sure to get it at work   Don't forget to schedule your mammogram   For vitamin D -continue 3000 iu daily and take the weekly high dose for 12 weeks   You may be a candidate for the new shingles vaccine  (Shingrix) when it is available  We will discuss it again in the future   Get back to self care when you have more time - I suspect your fatigue will improve        Vitamin D deficiency - Primary    D is still in 36s with 3000 iu daily  Will tx with 50,000 iu weekly for 12 weeks and continue the 3000 daily  Re check next time  Disc imp to bone and overall health

## 2017-01-24 NOTE — Assessment & Plan Note (Signed)
Continue yearly mammograms and exams

## 2017-01-24 NOTE — Assessment & Plan Note (Signed)
Continue yearly mammograms and exams  Nl exam today

## 2017-01-24 NOTE — Assessment & Plan Note (Signed)
Discussed how this problem influences overall health and the risks it imposes  Reviewed plan for weight loss with lower calorie diet (via better food choices and also portion control or program like weight watchers) and exercise building up to or more than 30 minutes 5 days per week including some aerobic activity    

## 2017-01-24 NOTE — Assessment & Plan Note (Signed)
D is still in 72s with 3000 iu daily  Will tx with 50,000 iu weekly for 12 weeks and continue the 3000 daily  Re check next time  Disc imp to bone and overall health

## 2017-01-24 NOTE — Assessment & Plan Note (Signed)
Disc goals for lipids and reasons to control them Rev labs with pt Rev low sat fat diet in detail  Stable HDL in the 50s LDL 125 Disc low sat/trans fat diet

## 2017-01-24 NOTE — Assessment & Plan Note (Signed)
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

## 2017-01-24 NOTE — Assessment & Plan Note (Signed)
Reviewed health habits including diet and exercise and skin cancer prevention Reviewed appropriate screening tests for age  Also reviewed health mt list, fam hx and immunization status , as well as social and family history   See HPI Labs reviewed You are due for a 10 year Tdap this year -make sure to get it at work   Don't forget to schedule your mammogram   For vitamin D -continue 3000 iu daily and take the weekly high dose for 12 weeks   You may be a candidate for the new shingles vaccine (Shingrix) when it is available  We will discuss it again in the future   Get back to self care when you have more time - I suspect your fatigue will improve

## 2017-02-21 ENCOUNTER — Other Ambulatory Visit: Payer: Self-pay

## 2017-02-25 ENCOUNTER — Encounter: Payer: Self-pay | Admitting: Family Medicine

## 2017-06-13 ENCOUNTER — Encounter: Payer: Self-pay | Admitting: Internal Medicine

## 2017-06-13 ENCOUNTER — Ambulatory Visit: Payer: Managed Care, Other (non HMO) | Admitting: Internal Medicine

## 2017-06-13 VITALS — BP 130/82 | HR 88 | Temp 97.8°F | Wt 196.0 lb

## 2017-06-13 DIAGNOSIS — R110 Nausea: Secondary | ICD-10-CM

## 2017-06-13 MED ORDER — PROMETHAZINE HCL 12.5 MG PO TABS: 12.5000 mg | ORAL_TABLET | Freq: Four times a day (QID) | ORAL | 0 refills | Status: DC | PRN

## 2017-06-13 NOTE — Progress Notes (Signed)
Subjective:    Patient ID: Makayla [name], female    DOB: Dec 12, 1960, 56 y.o.   MRN: [id number]  HPI   Pt presents to the clinic today with c/o abdominal cramping, nausea and vomiting. She reports this started 6 days ago, shortly after eating [employer] for breakfast.. She denies any recent vomiting. The abdominal cramping has resolved. Her main complaint at this time is decreased appetite and nausea. She reports she has been consuming a bland diet. She denies diarrhea. She denies fever, chills or body aches. She has not taken anything OTC. She denies recent changes in diet or medication. She does have a history of GERD and is taking Zantac daily. She has not had sick contacts that she is aware of.  Review of Systems      Past Medical History:  Diagnosis Date  . 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 Bursitis, trochanteric    bi/lat   . Dental crowns present    x 4  . GERD (gastroesophageal reflux disease)   . Hyperlipidemia    no current med.  . Hypertension    white coat syndrome,  . Mass of breast, left 01/2012  . Obesity     Current Outpatient Medications  Medication Sig Dispense Refill  . b complex vitamins tablet Take 1 tablet by mouth daily.    Marland Kitchen CALCIUM PO Take 600 mg by mouth daily.     Marland Kitchen ibuprofen (ADVIL,MOTRIN) 800 MG tablet TAKE ONE TABLET BY MOUTH EVERY 8 HOURS AS NEEDED FOR PAIN 30 tablet 3  . LORazepam (ATIVAN) 0.5 MG tablet Take 1 tablet (0.5 mg total) by mouth every 8 (eight) hours as needed for anxiety. 30 tablet 0  . ranitidine (ZANTAC) 300 MG tablet Take 1 tablet (300 mg total) by mouth 2 (two) times daily. 180 tablet 3  . VITAMIN D, CHOLECALCIFEROL, PO Take 1 tablet by mouth daily.     No current facility-administered medications for this visit.     No Known Allergies  Family History  Problem Relation Age of Onset  . Hypertension Mother   . Hyperlipidemia Mother     . Diabetes Mother   . Cancer Mother        uterine  . GER disease Father   . Depression Sister   . Heart disease Brother   . Heart attack Maternal Grandmother   . Hypertension Maternal Grandmother     Social History   Socioeconomic History  . Marital status: Married    Spouse name: Not on file  . Number of children: 1  . Years of education: Not on file  . Highest education level: Not on file  Social Needs  . Financial resource strain: Not on file  . Food insecurity - worry: Not on file  . Food insecurity - inability: Not on file  . Transportation needs - medical: Not on file  . Transportation needs - non-medical: Not on file  Occupational History  . Occupation: Therapist, sports  Tobacco Use  . Smoking status: Never Smoker  . Smokeless tobacco: Never Used  Substance and Sexual Activity  . Alcohol use: No    Alcohol/week: 0.0 oz  . Drug use: No  . Sexual activity: Not on file    Comment: has used estradiol patch 0.025  in the past, has stopped  Other Topics Concern  . Not on file  Social History Narrative  . Not on file  Constitutional: Denies fever, malaise, fatigue, headache or abrupt weight changes.  Gastrointestinal: Pt reports nausea. Denies abdominal pain, bloating, constipation, diarrhea or blood in the stool.    No other specific complaints in a complete review of systems (except as listed in HPI above).  Objective:   Physical Exam  BP 130/82   Pulse 88   Temp 97.8 F (36.6 C) (Oral)   Wt 196 lb (88.9 kg)   SpO2 98%   BMI 33.64 kg/m   Wt Readings from Last 3 Encounters:  06/13/17 196 lb (88.9 kg)  01/23/17 199 lb 8 oz (90.5 kg)  10/31/16 198 lb 8 oz (90 kg)    General: Appears her stated age, in NAD. Abdomen: Soft and nontender. Normal bowel sounds. No distention or masses noted.    BMET    Component Value Date/Time   NA 140 01/21/2017 0824   K 4.0 01/21/2017 0824   CL 105 01/21/2017 0824   CO2 31 01/21/2017 0824   GLUCOSE 101 (H) 01/21/2017  0824   BUN 15 01/21/2017 0824   CREATININE 0.80 01/21/2017 0824   CREATININE 0.97 10/17/2011 0847   CALCIUM 9.6 01/21/2017 0824   GFRNONAA >60 10/25/2016 0714   GFRAA >60 10/25/2016 0714    Lipid Panel     Component Value Date/Time   CHOL 194 01/21/2017 0824   TRIG 76.0 01/21/2017 0824   HDL 53.80 01/21/2017 0824   CHOLHDL 4 01/21/2017 0824   VLDL 15.2 01/21/2017 0824   LDLCALC 125 (H) 01/21/2017 0824    CBC    Component Value Date/Time   WBC 8.1 01/21/2017 0824   RBC 5.01 01/21/2017 0824   HGB 13.6 01/21/2017 0824   HGB 13.6 03/04/2012 1604   HCT 41.8 01/21/2017 0824   HCT 42.2 03/04/2012 1604   PLT 179.0 01/21/2017 0824   PLT 225 03/04/2012 1604   MCV 83.5 01/21/2017 0824   MCV 84.4 03/04/2012 1604   MCH 27.4 10/25/2016 0714   MCHC 32.5 01/21/2017 0824   RDW 14.4 01/21/2017 0824   RDW 14.6 (H) 03/04/2012 1604   LYMPHSABS 2.4 01/21/2017 0824   LYMPHSABS 3.1 03/04/2012 1604   MONOABS 0.7 01/21/2017 0824   MONOABS 0.8 03/04/2012 1604   EOSABS 0.2 01/21/2017 0824   EOSABS 0.2 03/04/2012 1604   BASOSABS 0.1 01/21/2017 0824   BASOSABS 0.1 03/04/2012 1604    Hgb A1C Lab Results  Component Value Date   HGBA1C 5.5 01/25/2012            Assessment & Plan:   Nausea:  It sounds like something she ate did not agree with her The cramping and vomiting have resolved Encouraged her to consume clear liquids and advance to a bland diet as tolerated eRx for Promethazine 12.5 mg dialy Continue Zantac  Return precautions discussed Webb Silversmith, NP

## 2017-06-14 ENCOUNTER — Encounter: Payer: Self-pay | Admitting: Internal Medicine

## 2017-06-14 ENCOUNTER — Encounter: Payer: Self-pay | Admitting: Physician Assistant

## 2017-06-14 ENCOUNTER — Ambulatory Visit: Payer: Self-pay | Admitting: Physician Assistant

## 2017-06-14 VITALS — BP 120/80 | HR 87 | Temp 98.5°F | Resp 16

## 2017-06-14 DIAGNOSIS — R110 Nausea: Secondary | ICD-10-CM

## 2017-06-14 MED ORDER — ONDANSETRON HCL 4 MG PO TABS: 4.0000 mg | ORAL_TABLET | Freq: Three times a day (TID) | ORAL | 0 refills | Status: DC | PRN

## 2017-06-14 NOTE — Progress Notes (Signed)
S:  Pt c/o vomiting and diarrhea last week. States she went to breakfast at [employer] with her daughter and ate chicken minis,  one hour later she felt like she had a rock in her stomach and she started to vomit. Had eaten pizza the night before. noone else has been sick that ate the same food. He eats the vomiting stopped after 2 days. The nausea continues. The diarrhea stopped after 2-3 days. The diarrhea was very watery. Now she is able to have a normal bowel movement.  Denies fever/chills, no abd pain; denies cp/sob, denies camping, recent antibiotics, or exposure to bad water Remainder ros neg  O:  Vitals wnl, nad, ENT wnl, neck supple no lymph, lungs c t a, cv rrr, abd soft nontender bs normal in all 4 quads b/l, neuro intact  A:  Viral gastroenteritis  P:  Reassurance, fluids, brat diet, immodium ad for diarrhea if needed, rx Zofran 4mg  tid prn vomiting, return if not better in 3 days, return earlier if worsening, Labs ordered

## 2017-06-14 NOTE — Patient Instructions (Signed)
Nausea, Adult Feeling sick to your stomach (nausea) means that your stomach is upset or you feel like you have to throw up (vomit). Feeling sick to your stomach is usually not serious, but it may be an early sign of a more serious medical problem. As you feel sicker to your stomach, it can lead to throwing up (vomiting). If you throw up, or if you are not able to drink enough fluids, there is a risk of dehydration. Dehydration can make you feel tired and thirsty, have a dry mouth, and pee (urinate) less often. Older adults and people who have other diseases or a weak defense (immune) system have a higher risk of dehydration. The main goal of treating this condition is to:  Limit how often you feel sick to your stomach.  Prevent throwing up and dehydration.  Follow these instructions at home: Follow instructions from your doctor about how to care for yourself at home. Eating and drinking Follow these recommendations as told by your doctor:  Take an oral rehydration solution (ORS). This is a drink that is sold at pharmacies and stores.  Drink clear fluids in small amounts as you are able, such as: ? Water. ? Ice chips. ? Fruit juice that has water added (diluted fruit juice). ? Low-calorie sports drinks.  Eat bland, easy to digest foods in small amounts as you are able, such as: ? Bananas. ? Applesauce. ? Rice. ? Lean meats. ? Toast. ? Crackers.  Avoid drinking fluids that contain a lot of sugar or caffeine.  Avoid alcohol.  Avoid spicy or fatty foods.  General instructions  Drink enough fluid to keep your pee (urine) clear or pale yellow.  Wash your hands often. If you cannot use soap and water, use hand sanitizer.  Make sure that all people in your household wash their hands well and often.  Rest at home while you get better.  Take over-the-counter and prescription medicines only as told by your doctor.  Breathe slowly and deeply when you feel sick to your  stomach.  Watch your condition for any changes.  Keep all follow-up visits as told by your doctor. This is important. Contact a doctor if:  You have a headache.  You have new symptoms.  You feel sicker to your stomach.  You have a fever.  You feel light-headed or dizzy.  You throw up.  You are not able to keep fluids down. Get help right away if:  You have pain in your chest, neck, arm, or jaw.  You feel very weak or you pass out (faint).  You have throw up that is bright red or looks like coffee grounds.  You have bloody or black poop (stools), or poop that looks like tar.  You have a very bad headache, a stiff neck, or both.  You have very bad pain, cramping, or bloating in your belly.  You have a rash.  You have trouble breathing or you are breathing very quickly.  Your heart is beating very quickly.  Your skin feels cold and clammy.  You feel confused.  You have pain while peeing.  You have signs of dehydration, such as: ? Dark pee, or very little or no pee. ? Cracked lips. ? Dry mouth. ? Sunken eyes. ? Sleepiness. ? Weakness. These symptoms may be an emergency. Do not wait to see if the symptoms will go away. Get medical help right away. Call your local emergency services (911 in the [place] Do not drive yourself to   the hospital. This information is not intended to replace advice given to you by your health care provider. Make sure you discuss any questions you have with your health care provider. Document Released: 07/12/2011 Document Revised: 12/29/2015 Document Reviewed: 03/29/2015 Elsevier Interactive Patient Education  2018 Elsevier Inc.  

## 2017-06-15 LAB — CBC WITH DIFFERENTIAL/PLATELET
BASOS: 1 %
Basophils Absolute: 0.1 10*3/uL (ref 0.0–0.2)
EOS (ABSOLUTE): 0.1 10*3/uL (ref 0.0–0.4)
Eos: 2 %
Hematocrit: 41.7 % (ref 34.0–46.6)
Hemoglobin: 14 g/dL (ref 11.1–15.9)
IMMATURE GRANULOCYTES: 0 %
Immature Grans (Abs): 0 10*3/uL (ref 0.0–0.1)
Lymphocytes Absolute: 1.5 10*3/uL (ref 0.7–3.1)
Lymphs: 22 %
MCH: 27.6 pg (ref 26.6–33.0)
MCHC: 33.6 g/dL (ref 31.5–35.7)
MCV: 82 fL (ref 79–97)
MONOS ABS: 0.6 10*3/uL (ref 0.1–0.9)
Monocytes: 9 %
NEUTROS ABS: 4.4 10*3/uL (ref 1.4–7.0)
NEUTROS PCT: 66 %
PLATELETS: 134 10*3/uL — AB (ref 150–379)
RBC: 5.08 x10E6/uL (ref 3.77–5.28)
RDW: 14.4 % (ref 12.3–15.4)
WBC: 6.7 10*3/uL (ref 3.4–10.8)

## 2017-06-15 LAB — LIPASE: LIPASE: 21 U/L (ref 14–72)

## 2017-06-15 LAB — COMPREHENSIVE METABOLIC PANEL
A/G RATIO: 1.6 (ref 1.2–2.2)
ALT: 12 IU/L (ref 0–32)
AST: 16 IU/L (ref 0–40)
Albumin: 4.1 g/dL (ref 3.5–5.5)
Alkaline Phosphatase: 95 IU/L (ref 39–117)
BILIRUBIN TOTAL: 0.4 mg/dL (ref 0.0–1.2)
BUN/Creatinine Ratio: 10 (ref 9–23)
BUN: 8 mg/dL (ref 6–24)
CO2: 23 mmol/L (ref 20–29)
Calcium: 9 mg/dL (ref 8.7–10.2)
Chloride: 103 mmol/L (ref 96–106)
Creatinine, Ser: 0.81 mg/dL (ref 0.57–1.00)
GFR calc Af Amer: 93 mL/min/{1.73_m2} (ref >59)
GFR calc non Af Amer: 81 mL/min/{1.73_m2} (ref >59)
Globulin, Total: 2.5 g/dL (ref 1.5–4.5)
Glucose: 99 mg/dL (ref 65–99)
POTASSIUM: 4.1 mmol/L (ref 3.5–5.2)
Sodium: 141 mmol/L (ref 134–144)
Total Protein: 6.6 g/dL (ref 6.0–8.5)

## 2017-06-15 LAB — HGB A1C W/O EAG: HEMOGLOBIN A1C: 5.5 % (ref 4.8–5.6)

## 2017-08-23 ENCOUNTER — Other Ambulatory Visit: Payer: Self-pay | Admitting: Family Medicine

## 2017-08-23 NOTE — Telephone Encounter (Signed)
Last filled on 02/13/16, not on current med list, please advise

## 2017-08-23 NOTE — Telephone Encounter (Signed)
Not on old or new med list  Please confirm with her what it is for  ? Knee pain ?

## 2017-08-26 NOTE — Telephone Encounter (Signed)
Patient called at [phone], left VM to return call to office for a prescription request from [facility] for Ibuprofen 800 mg was received, the medication is not listed on her profile and Dr. Glori Bickers wants to know if you are using this and what is this used for.

## 2017-08-26 NOTE — Telephone Encounter (Signed)
Called pt and no answer and pt's VM box was full so couldn't leave VM (CRM created)

## 2017-08-26 NOTE — Telephone Encounter (Signed)
Patient called back and takes it PRN not everyday. Uses it for knee pain, headaches, right hip pain.

## 2017-08-27 NOTE — Telephone Encounter (Signed)
Left VM letting pt know Rx sent and advise of Dr. Tower's comments  

## 2017-08-27 NOTE — Telephone Encounter (Signed)
Will refill electronically Remind her to take with food and stop if GI upset  Thanks

## 2018-01-08 ENCOUNTER — Telehealth: Payer: Self-pay | Admitting: Family Medicine

## 2018-01-08 DIAGNOSIS — E785 Hyperlipidemia, unspecified: Secondary | ICD-10-CM

## 2018-01-08 DIAGNOSIS — E559 Vitamin D deficiency, unspecified: Secondary | ICD-10-CM

## 2018-01-08 DIAGNOSIS — Z0000 Encounter for general adult medical examination without abnormal findings: Secondary | ICD-10-CM

## 2018-01-08 NOTE — Telephone Encounter (Signed)
-----   Message from Lendon Collar, RT sent at 12/31/2017 10:26 AM EDT ----- Regarding: Lab orders for Thursday, June 6th Please enter CPE lab orders for June 6th. Thanks-Lauren

## 2018-01-09 ENCOUNTER — Other Ambulatory Visit (INDEPENDENT_AMBULATORY_CARE_PROVIDER_SITE_OTHER): Payer: Managed Care, Other (non HMO)

## 2018-01-09 DIAGNOSIS — E559 Vitamin D deficiency, unspecified: Secondary | ICD-10-CM

## 2018-01-09 DIAGNOSIS — Z0000 Encounter for general adult medical examination without abnormal findings: Secondary | ICD-10-CM | POA: Diagnosis not present

## 2018-01-09 DIAGNOSIS — E785 Hyperlipidemia, unspecified: Secondary | ICD-10-CM

## 2018-01-09 LAB — CBC WITH DIFFERENTIAL/PLATELET
BASOS PCT: 0.8 % (ref 0.0–3.0)
Basophils Absolute: 0.1 10*3/uL (ref 0.0–0.1)
EOS PCT: 2.4 % (ref 0.0–5.0)
Eosinophils Absolute: 0.2 10*3/uL (ref 0.0–0.7)
HCT: 41.8 % (ref 36.0–46.0)
HEMOGLOBIN: 13.8 g/dL (ref 12.0–15.0)
LYMPHS ABS: 1.8 10*3/uL (ref 0.7–4.0)
Lymphocytes Relative: 26.1 % (ref 12.0–46.0)
MCHC: 33.1 g/dL (ref 30.0–36.0)
MCV: 82.3 fl (ref 78.0–100.0)
MONO ABS: 0.6 10*3/uL (ref 0.1–1.0)
Monocytes Relative: 8 % (ref 3.0–12.0)
NEUTROS PCT: 62.7 % (ref 43.0–77.0)
Neutro Abs: 4.4 10*3/uL (ref 1.4–7.7)
Platelets: 149 10*3/uL — ABNORMAL LOW (ref 150.0–400.0)
RBC: 5.08 Mil/uL (ref 3.87–5.11)
RDW: 14.6 % (ref 11.5–15.5)
WBC: 7 10*3/uL (ref 4.0–10.5)

## 2018-01-09 LAB — TSH: TSH: 3.47 u[IU]/mL (ref 0.35–4.50)

## 2018-01-09 LAB — COMPREHENSIVE METABOLIC PANEL
ALT: 14 U/L (ref 0–35)
AST: 14 U/L (ref 0–37)
Albumin: 4.1 g/dL (ref 3.5–5.2)
Alkaline Phosphatase: 76 U/L (ref 39–117)
BUN: 11 mg/dL (ref 6–23)
CHLORIDE: 104 meq/L (ref 96–112)
CO2: 28 mEq/L (ref 19–32)
Calcium: 9.8 mg/dL (ref 8.4–10.5)
Creatinine, Ser: 0.83 mg/dL (ref 0.40–1.20)
GFR: 75.1 mL/min (ref >60.00)
Glucose, Bld: 109 mg/dL — ABNORMAL HIGH (ref 70–99)
POTASSIUM: 4 meq/L (ref 3.5–5.1)
SODIUM: 140 meq/L (ref 135–145)
Total Bilirubin: 0.5 mg/dL (ref 0.2–1.2)
Total Protein: 6.7 g/dL (ref 6.0–8.3)

## 2018-01-09 LAB — VITAMIN D 25 HYDROXY (VIT D DEFICIENCY, FRACTURES): VITD: 26.96 ng/mL — ABNORMAL LOW (ref 30.00–100.00)

## 2018-01-09 LAB — LIPID PANEL
Cholesterol: 181 mg/dL (ref 0–200)
HDL: 52.4 mg/dL (ref >39.00)
LDL Cholesterol: 111 mg/dL — ABNORMAL HIGH (ref 0–99)
NONHDL: 129.03
Total CHOL/HDL Ratio: 3
Triglycerides: 90 mg/dL (ref 0.0–149.0)
VLDL: 18 mg/dL (ref 0.0–40.0)

## 2018-01-16 ENCOUNTER — Encounter: Payer: Self-pay | Admitting: Family Medicine

## 2018-01-16 ENCOUNTER — Ambulatory Visit (INDEPENDENT_AMBULATORY_CARE_PROVIDER_SITE_OTHER): Payer: Managed Care, Other (non HMO) | Admitting: Family Medicine

## 2018-01-16 VITALS — BP 128/76 | HR 81 | Temp 97.7°F | Ht 64.0 in | Wt 204.8 lb

## 2018-01-16 DIAGNOSIS — E6609 Other obesity due to excess calories: Secondary | ICD-10-CM

## 2018-01-16 DIAGNOSIS — E785 Hyperlipidemia, unspecified: Secondary | ICD-10-CM

## 2018-01-16 DIAGNOSIS — Z0000 Encounter for general adult medical examination without abnormal findings: Secondary | ICD-10-CM

## 2018-01-16 DIAGNOSIS — E66812 Obesity, class 2: Secondary | ICD-10-CM

## 2018-01-16 DIAGNOSIS — R7309 Other abnormal glucose: Secondary | ICD-10-CM | POA: Diagnosis not present

## 2018-01-16 DIAGNOSIS — Z1231 Encounter for screening mammogram for malignant neoplasm of breast: Secondary | ICD-10-CM | POA: Diagnosis not present

## 2018-01-16 DIAGNOSIS — Z6835 Body mass index (BMI) 35.0-35.9, adult: Secondary | ICD-10-CM

## 2018-01-16 DIAGNOSIS — K219 Gastro-esophageal reflux disease without esophagitis: Secondary | ICD-10-CM | POA: Diagnosis not present

## 2018-01-16 DIAGNOSIS — E559 Vitamin D deficiency, unspecified: Secondary | ICD-10-CM | POA: Diagnosis not present

## 2018-01-16 NOTE — Progress Notes (Signed)
Subjective:    Patient ID: Makayla [name], female    DOB: Sep 17, 1960, 57 y.o.   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  Does not work weekends or take work home   [employer] from Last 3 Encounters:  01/16/18 204 lb 12 oz (92.9 kg)  06/13/17 196 lb (88.9 kg)  01/23/17 199 lb 8 oz (90.5 kg)  does not take care of herself- has to care for everyone else  Husband has been sick a few times Cares for elderly mom  Gets to see 74 yo grand daughter often  35.15 kg/m   Hard to take time off - has to plan way ahead   Does not prioritize herself   Has foot problems so she does not walk as much  Plantar fasciitis  Needs to f/u about her foot   Makes good diet choices- just needs portion control  Likes avocados No red meat    Tetanus shot 9/08- got a Tdap at work   Mammogram 6/17-negative  Self breast exam  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  Hysterectomy in the past  Overdue for breast exam Dr Marlou Starks    Flu shot 10/18  Colonoscopy 9/11- nl with 10 y recall   Vit D def Level is 26.9 Px ergocalciferol - then never took it    BP BP Readings from Last 3 Encounters:  01/16/18 128/76  06/14/17 120/80  06/13/17 130/82   Pulse Readings from Last 3 Encounters:  01/16/18 81  06/14/17 87  06/13/17 88   Hyperlipidemia  Lab Results  Component Value Date   CHOL 181 01/09/2018   CHOL 194 01/21/2017   CHOL 192 05/16/2015   Lab Results  Component Value Date   HDL 52.40 01/09/2018   HDL 53.80 01/21/2017   HDL 58.60 05/16/2015   Lab Results  Component Value Date   LDLCALC 111 (H) 01/09/2018   LDLCALC 125 (H) 01/21/2017   LDLCALC 118 (H) 05/16/2015   Lab Results  Component Value Date   TRIG 90.0 01/09/2018   TRIG 76.0 01/21/2017   TRIG 78.0 05/16/2015   Lab Results  Component Value Date   CHOLHDL 3 01/09/2018   CHOLHDL 4  01/21/2017   CHOLHDL 3 05/16/2015   No results found for: LDLDIRECT Looks good  No red meat or junk   Glucose 109  Mom has DM Does not eat sweets   Zoster status - ha snot had a vaccine   Lab Results  Component Value Date   WBC 7.0 01/09/2018   HGB 13.8 01/09/2018   HCT 41.8 01/09/2018   MCV 82.3 01/09/2018   PLT 149.0 (L) 01/09/2018    Lab Results  Component Value Date   CREATININE 0.83 01/09/2018   BUN 11 01/09/2018   NA 140 01/09/2018   K 4.0 01/09/2018   CL 104 01/09/2018   CO2 28 01/09/2018   Lab Results  Component Value Date   ALT 14 01/09/2018   AST 14 01/09/2018   ALKPHOS 76 01/09/2018   BILITOT 0.5 01/09/2018   Lab Results  Component Value Date   TSH 3.47 01/09/2018     Patient Active Problem List   Diagnosis Date Noted  . Elevated glucose level 01/16/2018  . Anxious mood 01/23/2017  . Chest pain 11/02/2016  . Left knee pain 09/13/2015  .  Vitamin D deficiency 05/20/2015  . Obesity 05/20/2015  . 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 Screening mammogram, encounter for 10/25/2011  . Routine general medical examination at a health care facility 10/16/2011  . Hyperlipidemia LDL goal <130 12/04/2007  . GERD 12/04/2007  . ELEVATED BLOOD PRESSURE WITHOUT DIAGNOSIS OF HYPERTENSION 12/04/2007   Past Medical History:  Diagnosis Date  . 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 Bursitis, trochanteric    bi/lat   . Dental crowns present    x 4  . GERD (gastroesophageal reflux disease)   . Hyperlipidemia    no current med.  . Hypertension    white coat syndrome,  . Mass of breast, left 01/2012  . Obesity    Past Surgical History:  Procedure Laterality Date  . ABDOMINAL HYSTERECTOMY     partial  . BREAST SURGERY  01/10/12   lumpectomy - left  . CHOLECYSTECTOMY N/A 02/10/2016   Procedure: LAPAROSCOPIC CHOLECYSTECTOMY WITH INTRAOPERATIVE  CHOLANGIOGRAM;  Surgeon: Autumn Messing III, MD;  Location: [facility];  Service: General;  Laterality: N/A;  . RECTOCELE REPAIR  06/14/2011   Procedure: POSTERIOR REPAIR (RECTOCELE);  Surgeon: Dutch Gray, MD;  Location: [facility];  Service: Urology;  Laterality: N/A;  cystoscopy, SPARC sling  . ROBOTIC ASSISTED LAPAROSCOPIC SACROCOLPOPEXY  06/14/2011   Procedure: ROBOTIC ASSISTED LAPAROSCOPIC SACROCOLPOPEXY;  Surgeon: Dutch Gray, MD;  Location: [facility];  Service: Urology;  Laterality: N/A;  . TUBAL LIGATION     Social History   Tobacco Use  . Smoking status: Never Smoker  . Smokeless tobacco: Never Used  Substance Use Topics  . Alcohol use: No    Alcohol/week: 0.0 oz  . Drug use: No   Family History  Problem Relation Age of Onset  . Hypertension Mother   . Hyperlipidemia Mother   . Diabetes Mother   . Cancer Mother        uterine  . GER disease Father   . Depression Sister   . Heart disease Brother   . Heart attack Maternal Grandmother   . Hypertension Maternal Grandmother    No Known Allergies Current Outpatient Medications on File Prior to Visit  Medication Sig Dispense Refill  . esomeprazole (NEXIUM) 20 MG capsule Take 20 mg by mouth daily at 12 noon.    Marland Kitchen ibuprofen (ADVIL,MOTRIN) 800 MG tablet TAKE ONE TABLET BY MOUTH EVERY 8 HOURS AS NEEDED FOR PAIN 90 tablet 0   No current facility-administered medications on file prior to visit.     Review of Systems  Constitutional: Negative for activity change, appetite change, fatigue, fever and unexpected weight change.  HENT: Negative for congestion, ear pain, rhinorrhea, sinus pressure and sore throat.   Eyes: Negative for pain, redness and visual disturbance.  Respiratory: Negative for cough, shortness of breath and wheezing.   Cardiovascular: Negative for chest pain and palpitations.  Gastrointestinal: Negative for abdominal pain, blood in stool, constipation and diarrhea.  Endocrine: Negative for polydipsia and polyuria.  Genitourinary:  Negative for dysuria, frequency and urgency.  Musculoskeletal: Negative for arthralgias, back pain and myalgias.  Skin: Negative for pallor and rash.  Allergic/Immunologic: Negative for environmental allergies.  Neurological: Negative for dizziness, syncope and headaches.  Hematological: Negative for adenopathy. Does not bruise/bleed easily.  Psychiatric/Behavioral: Negative for decreased concentration and dysphoric mood. The patient is not nervous/anxious.        Objective:   Physical Exam  Constitutional: She appears  well-developed and well-nourished. No distress.  obese and well appearing   HENT:  Head: Normocephalic and atraumatic.  Right Ear: External ear normal.  Left Ear: External ear normal.  Mouth/Throat: Oropharynx is clear and moist.  Eyes: Pupils are equal, round, and reactive to light. Conjunctivae and EOM are normal. No scleral icterus.  Neck: Normal range of motion. Neck supple. No JVD present. Carotid bruit is not present. No thyromegaly present.  Cardiovascular: Normal rate, regular rhythm, normal heart sounds and intact distal pulses. Exam reveals no gallop.  Pulmonary/Chest: Effort normal and breath sounds normal. No respiratory distress. She has no wheezes. She exhibits no tenderness. No breast tenderness, discharge or bleeding.  Abdominal: Soft. Bowel sounds are normal. She exhibits no distension, no abdominal bruit and no mass. There is no tenderness.  Genitourinary: No breast tenderness, discharge or bleeding.  Genitourinary Comments: Breast exam: No mass, nodules, thickening, tenderness, bulging, retraction, inflamation, nipple discharge or skin changes noted.  No axillary or clavicular LA.       Musculoskeletal: Normal range of motion. She exhibits no edema or tenderness.  No kyphosis   Lymphadenopathy:    She has no cervical adenopathy.  Neurological: She is alert. She has normal reflexes. No cranial nerve deficit. She exhibits normal muscle tone. Coordination  normal.  Skin: Skin is warm and dry. No rash noted. No erythema. No pallor.  Psychiatric: She has a normal mood and affect.  Pleasant           Assessment & Plan:   Problem List Items Addressed This Visit      Digestive   GERD    Takes nexium otc prn  Zantac does not help her symptoms       Relevant Medications   esomeprazole (NEXIUM) 20 MG capsule     Other   Elevated glucose level    Glucose is mildly elevated  disc imp of low glycemic diet and wt loss to prevent DM2       Hyperlipidemia LDL goal <130    Disc goals for lipids and reasons to control them Rev last labs with pt Rev low sat fat diet in detail       Obesity    Discussed how this problem influences overall health and the risks it imposes  Reviewed plan for weight loss with lower calorie diet (via better food choices and also portion control or program like weight watchers) and exercise building up to or more than 30 minutes 5 days per week including some aerobic activity         Routine general medical examination at a health care facility - Primary    Reviewed health habits including diet and exercise and skin cancer prevention Reviewed appropriate screening tests for age  Also reviewed health mt list, fam hx and immunization status , as well as social and family history   See HPI Labs disc  shingrix vaccine disc Made plan for healthy diet and exercise  Start back on vit D      Screening mammogram, encounter for    Scheduled annual screening mammogram Nl breast exam today  Encouraged monthly self exams        Relevant Orders   MM DIGITAL SCREENING BILATERAL   Vitamin D deficiency    Still low  Disc imp to bone and overall health   5000 iu D3 otc daily

## 2018-01-16 NOTE — Patient Instructions (Addendum)
Let's refer you for a mammogram   Follow up about your foot when you can   Think about water exercise or a bike or yoga/videos for exercise until foot is better   Try to get most of your carbohydrates from produce (with the exception of white potatoes)  Eat less bread/pasta/rice/snack foods/cereals/sweets and other items from the middle of the grocery store (processed carbs)   Get your Tdap date to [place] when you can please   Get at least 5000 iu of vitamin D daily please   If you are interested in the new shingles vaccine (Shingrix) - call your local pharmacy to check on coverage and availability  If affordable- call your pharmacy and get on a wait list   Think about an appointment with Dr Lorelei Pont for your plantar fasciitis

## 2018-01-16 NOTE — Assessment & Plan Note (Signed)
Takes nexium otc prn  Zantac does not help her symptoms

## 2018-01-16 NOTE — Assessment & Plan Note (Signed)
Scheduled annual screening mammogram Nl breast exam today  Encouraged monthly self exams   

## 2018-01-16 NOTE — Assessment & Plan Note (Signed)
Disc goals for lipids and reasons to control them Rev last labs with pt Rev low sat fat diet in detail  

## 2018-01-16 NOTE — Assessment & Plan Note (Signed)
Glucose is mildly elevated  disc imp of low glycemic diet and wt loss to prevent DM2

## 2018-01-16 NOTE — Assessment & Plan Note (Signed)
Discussed how this problem influences overall health and the risks it imposes  Reviewed plan for weight loss with lower calorie diet (via better food choices and also portion control or program like weight watchers) and exercise building up to or more than 30 minutes 5 days per week including some aerobic activity    

## 2018-01-16 NOTE — Assessment & Plan Note (Signed)
Still low  Disc imp to bone and overall health   5000 iu D3 otc daily

## 2018-01-16 NOTE — Assessment & Plan Note (Addendum)
Reviewed health habits including diet and exercise and skin cancer prevention Reviewed appropriate screening tests for age  Also reviewed health mt list, fam hx and immunization status , as well as social and family history   See HPI Labs disc  shingrix vaccine disc Made plan for healthy diet and exercise  Start back on vit D

## 2018-01-21 ENCOUNTER — Encounter: Payer: Managed Care, Other (non HMO) | Admitting: Family Medicine

## 2018-01-24 ENCOUNTER — Other Ambulatory Visit: Payer: Self-pay

## 2018-01-30 ENCOUNTER — Encounter: Payer: Managed Care, Other (non HMO) | Admitting: Family Medicine

## 2018-02-05 ENCOUNTER — Ambulatory Visit
Admission: RE | Admit: 2018-02-05 | Discharge: 2018-02-05 | Disposition: A | Discharge status: Home / Self Care | Payer: Managed Care, Other (non HMO) | Source: Ambulatory Visit | Attending: Family Medicine | Admitting: Family Medicine

## 2018-02-05 DIAGNOSIS — Z1231 Encounter for screening mammogram for malignant neoplasm of breast: Secondary | ICD-10-CM

## 2018-02-06 ENCOUNTER — Other Ambulatory Visit: Payer: Self-pay | Admitting: Family Medicine

## 2018-02-07 NOTE — Telephone Encounter (Signed)
CPE was on 01/16/18, last filled on 08/27/17 #90 tabs with 0 refills

## 2018-04-25 IMAGING — US US ABDOMEN LIMITED
1 series · 14 of 25 positions shown · non-contrast
Comparison: Abdominal ultrasound dated 01/22/2014

CLINICAL DATA: 54-year-old female with right upper quadrant
abdominal pain

EXAM:
US ABDOMEN LIMITED - RIGHT UPPER QUADRANT

[Series 1: us abdomen limited · 0.26mm/px · 14 of 46 slices shown]
[im 1/46]
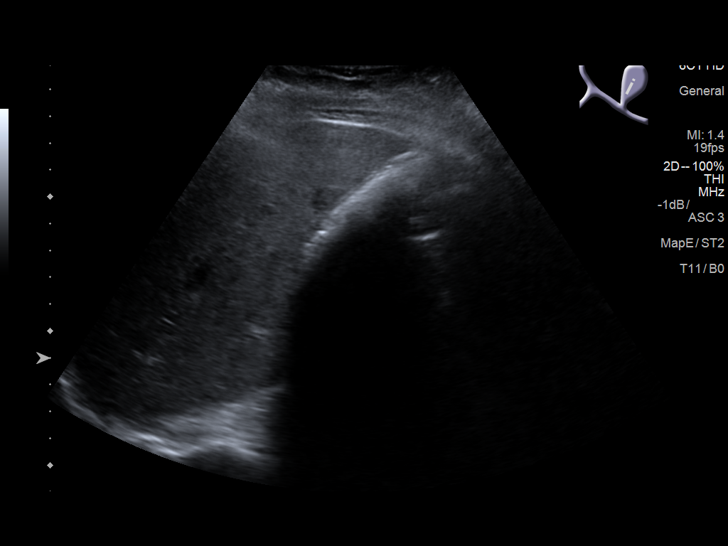
[im 4/46]
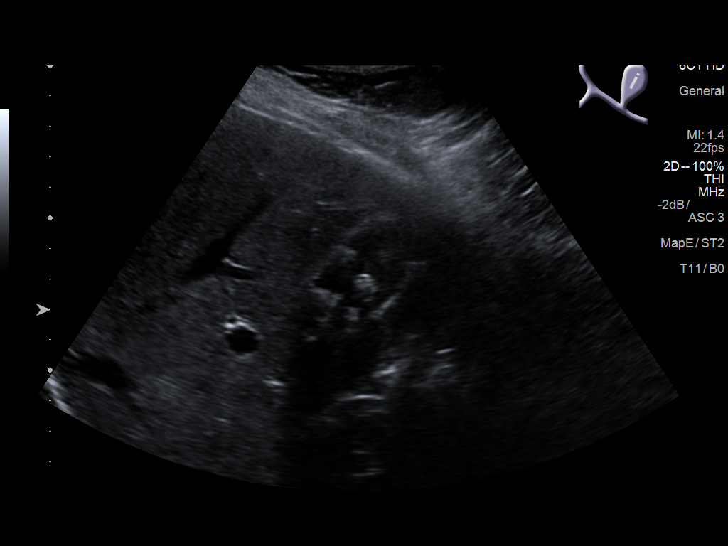
[im 8/46]
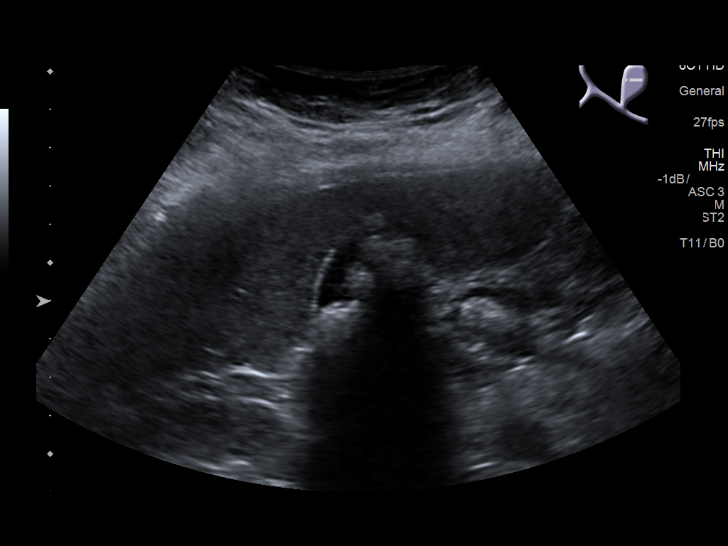
[im 12/46]
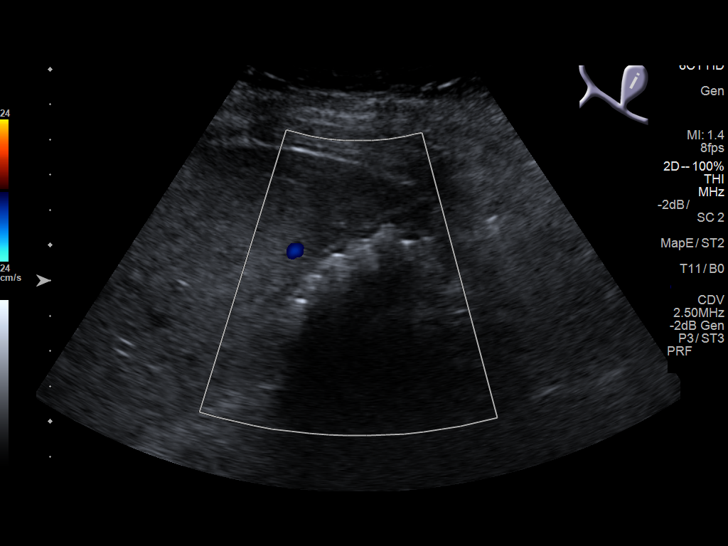
[im 16/46]
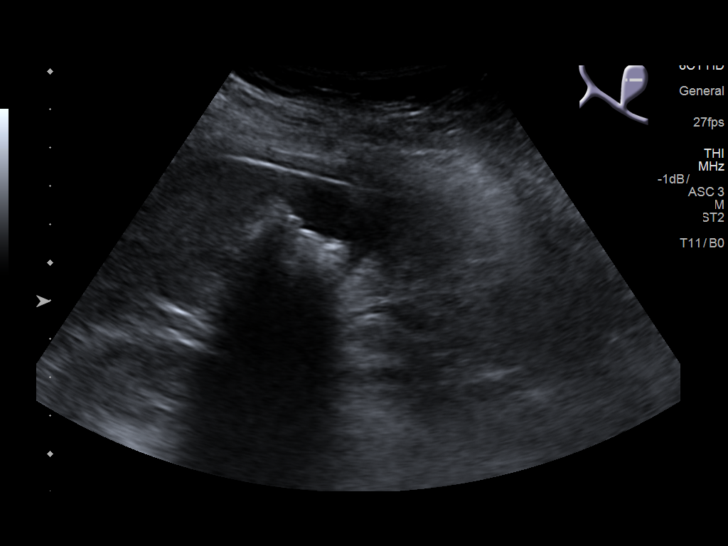
[im 17/46]
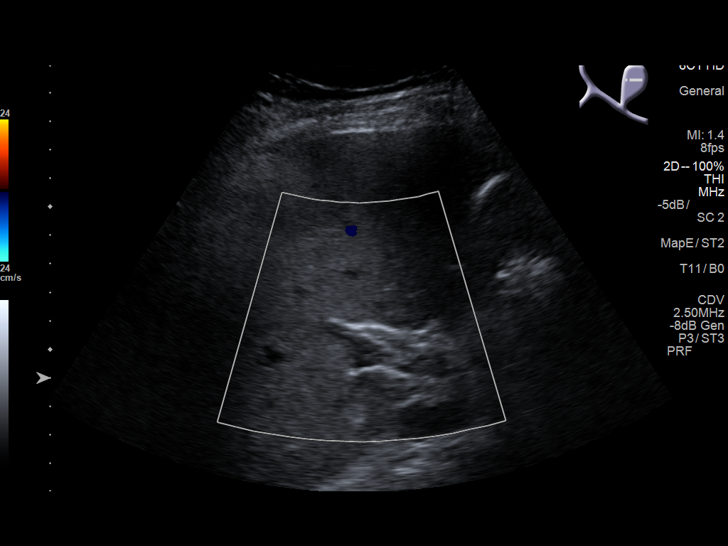
[im 21/46]
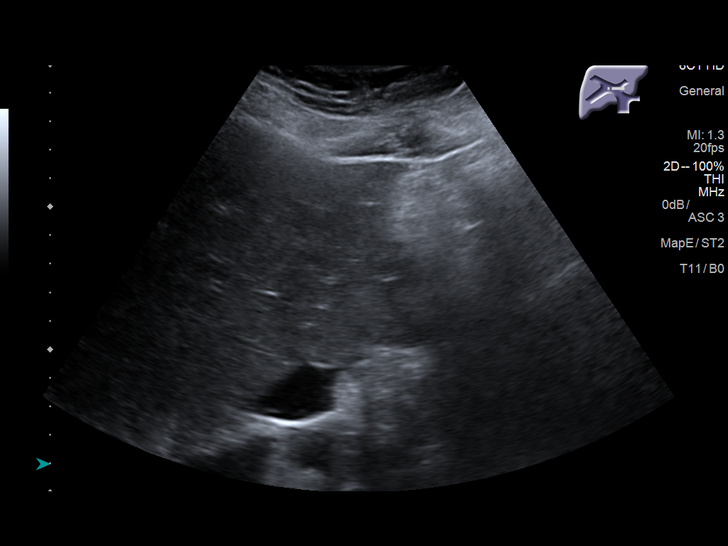
[im 25/46]
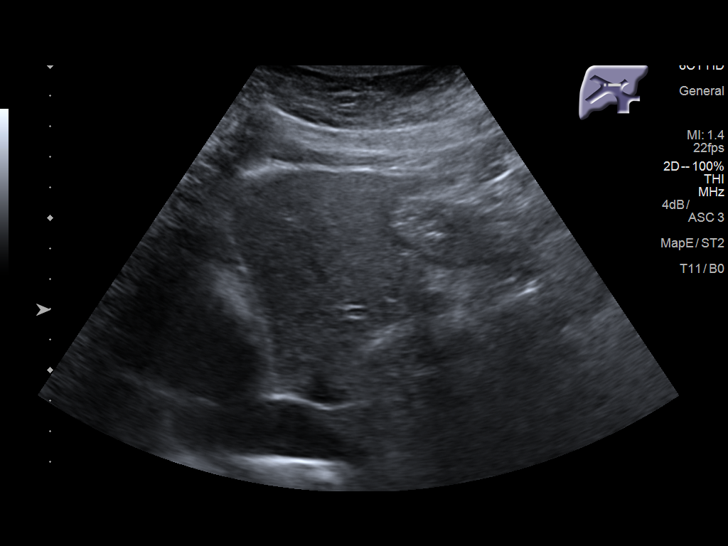
[im 29/46]
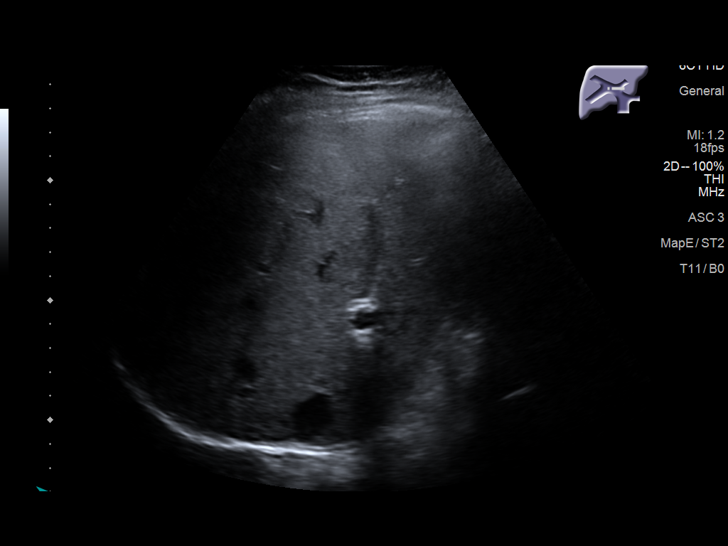
[im 31/46]
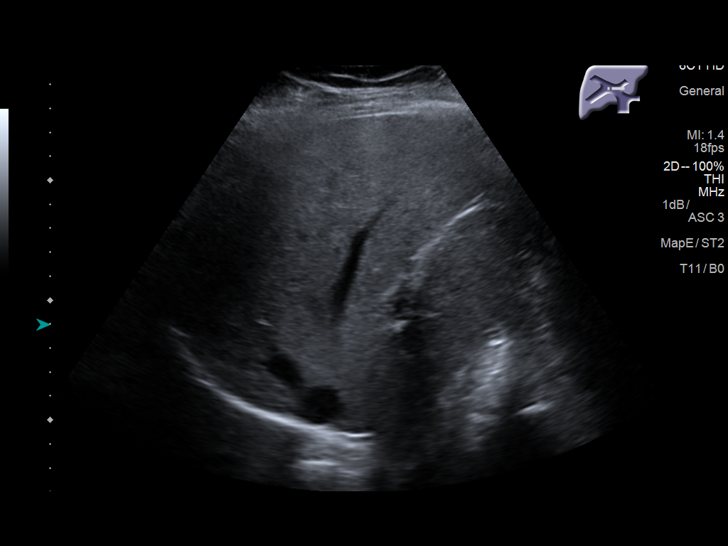
[im 34/46]
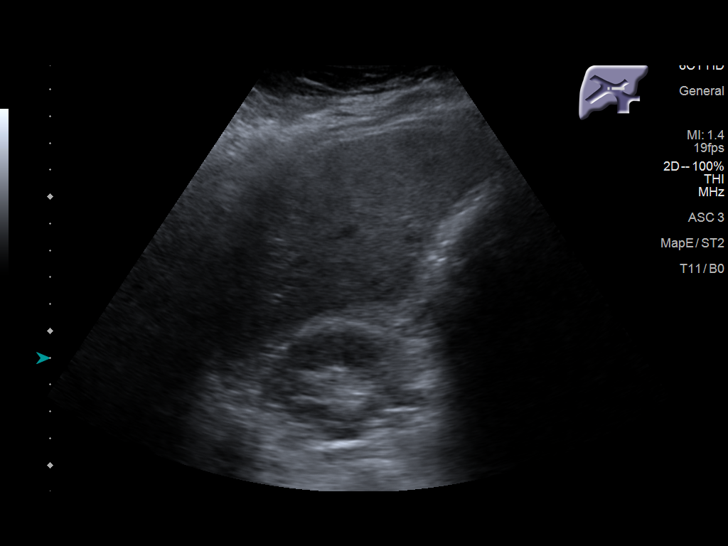
[im 38/46]
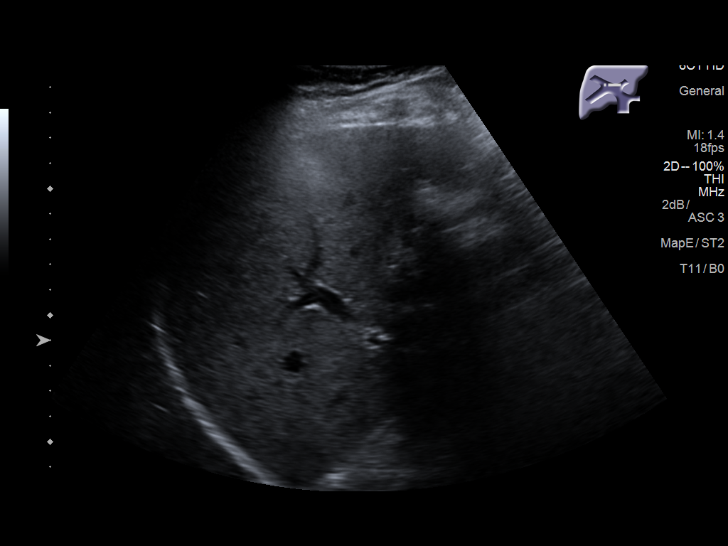
[im 42/46]
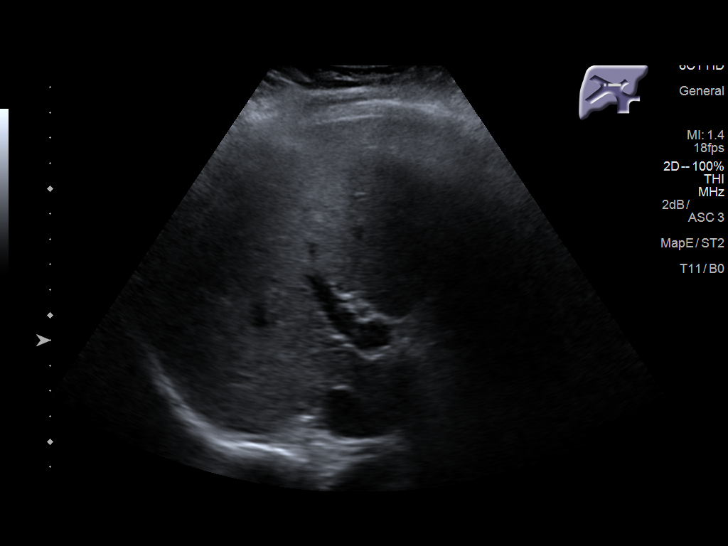
[im 46/46]
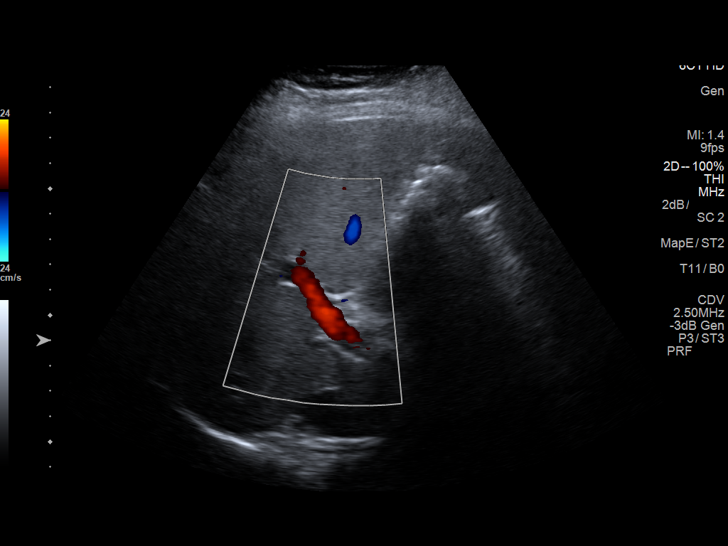

[14 of 25 positions shown; findings below may reference images not displayed]

FINDINGS: Gallbladder:

The gallbladder is filled with stones. There is no gallbladder wall
thickening or pericholecystic fluid. Negative sonographic Murphy's
sign.

Common bile duct:

Diameter: 4 mm

Liver:

There is mild diffuse increased hepatic echotexture, likely mild
fatty infiltration.
IMPRESSION: Cholelithiasis without sonographic evidence of acute cholecystitis.
A hepatobiliary scintigraphy may provide better evaluation of the
gallbladder if an acute cholecystitis is clinically suspected.

## 2018-07-23 ENCOUNTER — Telehealth: Payer: Self-pay

## 2018-07-23 MED ORDER — OSELTAMIVIR PHOSPHATE 75 MG PO CAPS: 75.0000 mg | ORAL_CAPSULE | Freq: Every day | ORAL | 0 refills | Status: DC

## 2018-07-23 NOTE — Telephone Encounter (Signed)
Px sent

## 2018-07-23 NOTE — Telephone Encounter (Signed)
Pt here with husband who tested positive for Flu A. Pt would like rx for Tamiflu sent to [facility].

## 2018-10-01 ENCOUNTER — Emergency Department
Admission: EM | Admit: 2018-10-01 | Discharge: 2018-10-02 | Disposition: A | Discharge status: Home / Self Care | Payer: Managed Care, Other (non HMO) | Attending: Emergency Medicine | Admitting: Emergency Medicine

## 2018-10-01 ENCOUNTER — Emergency Department: Payer: Managed Care, Other (non HMO)

## 2018-10-01 ENCOUNTER — Encounter: Payer: Self-pay | Admitting: Emergency Medicine

## 2018-10-01 ENCOUNTER — Other Ambulatory Visit: Payer: Self-pay

## 2018-10-01 DIAGNOSIS — Z79899 Other long term (current) drug therapy: Secondary | ICD-10-CM | POA: Insufficient documentation

## 2018-10-01 DIAGNOSIS — R42 Dizziness and giddiness: Secondary | ICD-10-CM | POA: Diagnosis present

## 2018-10-01 DIAGNOSIS — Z9049 Acquired absence of other specified parts of digestive tract: Secondary | ICD-10-CM | POA: Diagnosis not present

## 2018-10-01 DIAGNOSIS — R000 Tachycardia, unspecified: Secondary | ICD-10-CM | POA: Diagnosis not present

## 2018-10-01 DIAGNOSIS — I10 Essential (primary) hypertension: Secondary | ICD-10-CM | POA: Insufficient documentation

## 2018-10-01 LAB — CBC WITH DIFFERENTIAL/PLATELET
Abs Immature Granulocytes: 0.03 10*3/uL (ref 0.00–0.07)
Basophils Absolute: 0.1 10*3/uL (ref 0.0–0.1)
Basophils Relative: 1 %
EOS PCT: 2 %
Eosinophils Absolute: 0.3 10*3/uL (ref 0.0–0.5)
HCT: 45.1 % (ref 36.0–46.0)
Hemoglobin: 14.3 g/dL (ref 12.0–15.0)
Immature Granulocytes: 0 %
Lymphocytes Relative: 28 %
Lymphs Abs: 3.1 10*3/uL (ref 0.7–4.0)
MCH: 26.4 pg (ref 26.0–34.0)
MCHC: 31.7 g/dL (ref 30.0–36.0)
MCV: 83.4 fL (ref 80.0–100.0)
MONO ABS: 1 10*3/uL (ref 0.1–1.0)
Monocytes Relative: 9 %
Neutro Abs: 6.8 10*3/uL (ref 1.7–7.7)
Neutrophils Relative %: 60 %
Platelets: 156 10*3/uL (ref 150–400)
RBC: 5.41 MIL/uL — ABNORMAL HIGH (ref 3.87–5.11)
RDW: 14.4 % (ref 11.5–15.5)
WBC: 11.3 10*3/uL — ABNORMAL HIGH (ref 4.0–10.5)
nRBC: 0 % (ref 0.0–0.2)

## 2018-10-01 NOTE — ED Triage Notes (Signed)
Patient ambulatory to triage with steady gait, without difficulty or distress noted; pt reports today having mid back pain/left shoulder; 1030pm, went to kitchen and had onset dizziness, heart racing; denies hx of same

## 2018-10-02 LAB — COMPREHENSIVE METABOLIC PANEL
ALT: 17 U/L (ref 0–44)
AST: 15 U/L (ref 15–41)
Albumin: 4.4 g/dL (ref 3.5–5.0)
Alkaline Phosphatase: 94 U/L (ref 38–126)
Anion gap: 10 (ref 5–15)
BUN: 16 mg/dL (ref 6–20)
CO2: 26 mmol/L (ref 22–32)
Calcium: 9.4 mg/dL (ref 8.9–10.3)
Chloride: 103 mmol/L (ref 98–111)
Creatinine, Ser: 0.96 mg/dL (ref 0.44–1.00)
Glucose, Bld: 127 mg/dL — ABNORMAL HIGH (ref 70–99)
Potassium: 3.2 mmol/L — ABNORMAL LOW (ref 3.5–5.1)
Sodium: 139 mmol/L (ref 135–145)
TOTAL PROTEIN: 7.6 g/dL (ref 6.5–8.1)
Total Bilirubin: 0.5 mg/dL (ref 0.3–1.2)

## 2018-10-02 LAB — TROPONIN I

## 2018-10-02 LAB — TSH: TSH: 6.998 u[IU]/mL — ABNORMAL HIGH (ref 0.350–4.500)

## 2018-10-02 LAB — FIBRIN DERIVATIVES D-DIMER (ARMC ONLY): Fibrin derivatives D-dimer (ARMC): 218.4 ng/mL (FEU) (ref 0.00–499.00)

## 2018-10-02 NOTE — ED Provider Notes (Signed)
[facility] Provider Note   ____________________________________________    I have reviewed the triage vital signs and the nursing notes.   HISTORY  Chief Complaint Back Pain and Tachycardia     HPI Makayla [name] is a 58 y.o. female who presents with complaints of dizziness and a sense of her heart racing.  Patient reports she had been sitting in her living room about to read a book when she decided to go get a ginger ale out of the kitchen, on the way back to the living room she became acutely lightheaded and thought she might pass out.  She sat in her chair for some time and started to feel better and thinks she may be been started to fall asleep but then her heart started racing again.  She does not have a history of palpitations or arrhythmias.  Denies chest pain.  Does report over the last day or 2 has had intermittent aching pains in her left side and upper right back, currently has no pain.  No fevers chills or shortness of breath.  No pleurisy.  No recent travel Past Medical History:  Diagnosis Date  . Bursitis, trochanteric    bi/lat   . Dental crowns present    x 4  . GERD (gastroesophageal reflux disease)   . Hyperlipidemia    no current med.  . Hypertension    white coat syndrome,  . Mass of breast, left 01/2012  . Obesity     Patient Active Problem List   Diagnosis Date Noted  . Elevated glucose level 01/16/2018  . Anxious mood 01/23/2017  . Chest pain 11/02/2016  . Left knee pain 09/13/2015  . Vitamin D deficiency 05/20/2015  . Obesity 05/20/2015  . 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 Screening mammogram, encounter for 10/25/2011  . Routine general medical examination at a health care facility 10/16/2011  . Hyperlipidemia LDL goal <130 12/04/2007  . GERD 12/04/2007  . ELEVATED BLOOD PRESSURE WITHOUT DIAGNOSIS OF HYPERTENSION 12/04/2007    Past Surgical History:  Procedure  Laterality Date  . ABDOMINAL HYSTERECTOMY     partial  . BREAST EXCISIONAL BIOPSY Left 2013  . BREAST SURGERY  01/10/12   lumpectomy - left  . CHOLECYSTECTOMY N/A 02/10/2016   Procedure: LAPAROSCOPIC CHOLECYSTECTOMY WITH INTRAOPERATIVE CHOLANGIOGRAM;  Surgeon: Autumn Messing III, MD;  Location: [place];  Service: General;  Laterality: N/A;  . RECTOCELE REPAIR  06/14/2011   Procedure: POSTERIOR REPAIR (RECTOCELE);  Surgeon: Dutch Gray, MD;  Location: [facility];  Service: Urology;  Laterality: N/A;  cystoscopy, SPARC sling  . ROBOTIC ASSISTED LAPAROSCOPIC SACROCOLPOPEXY  06/14/2011   Procedure: ROBOTIC ASSISTED LAPAROSCOPIC SACROCOLPOPEXY;  Surgeon: Dutch Gray, MD;  Location: [facility];  Service: Urology;  Laterality: N/A;  . TUBAL LIGATION      Prior to Admission medications   Medication Sig Start Date End Date Taking? Authorizing Provider  esomeprazole (NEXIUM) 20 MG capsule Take 20 mg by mouth daily at 12 noon.    [provider]  ibuprofen (ADVIL,MOTRIN) 800 MG tablet TAKE 1 TABLET BY MOUTH EVERY 8 HOURS AS NEEDED FOR PAIN 02/07/18   Tower, Wynelle Fanny, MD  oseltamivir (TAMIFLU) 75 MG capsule Take 1 capsule (75 mg total) by mouth daily. 07/23/18   Tower, Wynelle Fanny, MD     Allergies Patient has no known allergies.  Family History  Problem Relation Age of Onset  . Hypertension Mother   . Hyperlipidemia Mother   .  Diabetes Mother   . Cancer Mother        uterine  . Breast cancer Mother 52  . GER disease Father   . Depression Sister   . Heart disease Brother   . Heart attack Maternal Grandmother   . Hypertension Maternal Grandmother     Social History Social History   Tobacco Use  . Smoking status: Never Smoker  . Smokeless tobacco: Never Used  Substance Use Topics  . Alcohol use: No    Alcohol/week: 0.0 standard drinks  . Drug use: No    Review of Systems  Constitutional: No fever/chills Eyes: No visual changes.  ENT: No sore throat. Cardiovascular: Denies chest  pain. Respiratory: Denies shortness of breath. Gastrointestinal: No abdominal pain.  No nausea, no vomiting.   Genitourinary: Negative for dysuria. Musculoskeletal: Negative for back pain. Skin: Negative for rash. Neurological: Negative for headaches   ____________________________________________   PHYSICAL EXAM:  VITAL SIGNS: ED Triage Vitals  Enc Vitals Group     BP 10/01/18 2328 (!) 181/89     Pulse Rate 10/01/18 2328 (!) 125     Resp 10/01/18 2328 18     Temp 10/01/18 2328 97.9 F (36.6 C)     Temp Source 10/01/18 2328 Oral     SpO2 10/01/18 2328 98 %     Weight 10/01/18 2329 90.7 kg (200 lb)     Height 10/01/18 2329 1.6 m (5\' 3" )     Head Circumference --      Peak Flow --      Pain Score 10/01/18 2329 4     Pain Loc --      Pain Edu? --      Excl. in [place]? --     Constitutional: Alert and oriented. Eyes: Conjunctivae are normal.   Nose: No congestion/rhinnorhea. Mouth/Throat: Mucous membranes are moist.    Cardiovascular: Tachycardia, regular rhythm. Grossly normal heart sounds.  Good peripheral circulation. Respiratory: Normal respiratory effort.  No retractions. Lungs CTAB. Gastrointestinal: Soft and nontender. No distention.    Musculoskeletal: No lower extremity tenderness nor edema.  Warm and well perfused Neurologic:  Normal speech and language. No gross focal neurologic deficits are appreciated.  Skin:  Skin is warm, dry and intact. No rash noted. Psychiatric: Mood and affect are normal. Speech and behavior are normal.  ____________________________________________   LABS (all labs ordered are listed, but only abnormal results are displayed)  Labs Reviewed  CBC WITH DIFFERENTIAL/PLATELET - Abnormal; Notable for the following components:      Result Value   WBC 11.3 (*)    RBC 5.41 (*)    All other components within normal limits  COMPREHENSIVE METABOLIC PANEL - Abnormal; Notable for the following components:   Potassium 3.2 (*)    Glucose, Bld  127 (*)    All other components within normal limits  TSH - Abnormal; Notable for the following components:   TSH 6.998 (*)    All other components within normal limits  TROPONIN I  FIBRIN DERIVATIVES D-DIMER ([facility] ONLY)   ____________________________________________  EKG  ED ECG REPORT I, Lavonia Drafts, the attending physician, personally viewed and interpreted this ECG.  Date: 10/02/2018  Rhythm: Sinus tachycardia QRS Axis: normal Intervals: normal ST/T Wave abnormalities: normal Narrative Interpretation: no evidence of acute ischemia  ____________________________________________  RADIOLOGY  Chest x-ray unremarkable ____________________________________________   PROCEDURES  Procedure(s) performed: No  Procedures   Critical Care performed:No ____________________________________________   INITIAL IMPRESSION / ASSESSMENT AND PLAN / ED COURSE  Pertinent labs & imaging results that were available during my care of the patient were reviewed by me and considered in my medical decision making (see chart for details).  Patient presents with lightheadedness, tachycardia.  No chest pain no pleurisy.  No recent travel.  No calf pain or swelling.  Feels well currently and has no lightheadedness.  No fevers or chills or significant cough.  Differential includes arrhythmia, dehydration, thyroid dysfunction, less likely PE, no significant pain to support a diagnosis of dissection  ----------------------------------------- 2:07 AM on 10/02/2018 -----------------------------------------  Patient's lab work is quite reassuring, mild hypo-thyroidism, unlikely to be contributing to her symptoms today.  We did have the patient ambulates and she tolerated that quite well with no further dizziness.  Patient is adamant that she wants to go home despite the fact that she continues to have mild tachycardia with ambulating at rest, her heart rate drops back into the 90s.  D-dimer is  normal.  She states she has to work in the morning and would like to leave, she agrees to return if anything changes, will have her follow-up with cardiology    ____________________________________________   FINAL CLINICAL IMPRESSION(S) / ED DIAGNOSES  Final diagnoses:  Dizziness  Tachycardia        Note:  This document was prepared using Dragon voice recognition software and may include unintentional dictation errors.   Lavonia Drafts, MD 10/02/18 [phone]

## 2018-10-02 NOTE — ED Notes (Signed)
Ambulated pt per Corky Downs, MD. Vital signs: HR 120, 100 %, 144/88 while walking.

## 2018-10-03 ENCOUNTER — Ambulatory Visit: Payer: Managed Care, Other (non HMO) | Admitting: Family Medicine

## 2018-10-03 ENCOUNTER — Encounter: Payer: Self-pay | Admitting: Family Medicine

## 2018-10-03 VITALS — BP 126/78 | HR 102 | Temp 98.4°F | Ht 63.0 in | Wt 199.5 lb

## 2018-10-03 DIAGNOSIS — F419 Anxiety disorder, unspecified: Secondary | ICD-10-CM

## 2018-10-03 DIAGNOSIS — R0789 Other chest pain: Secondary | ICD-10-CM | POA: Diagnosis not present

## 2018-10-03 DIAGNOSIS — F4321 Adjustment disorder with depressed mood: Secondary | ICD-10-CM | POA: Diagnosis not present

## 2018-10-03 DIAGNOSIS — R000 Tachycardia, unspecified: Secondary | ICD-10-CM

## 2018-10-03 DIAGNOSIS — F4320 Adjustment disorder, unspecified: Secondary | ICD-10-CM | POA: Insufficient documentation

## 2018-10-03 MED ORDER — BUSPIRONE HCL 15 MG PO TABS: 7.5000 mg | ORAL_TABLET | Freq: Two times a day (BID) | ORAL | 11 refills | Status: DC

## 2018-10-03 NOTE — Progress Notes (Signed)
Subjective:    Patient ID: Makayla [name], female    DOB: 1961/03/14, 58 y.o.   MRN: [id number]  HPI Here for ED follow up  Was seen for dizziness and sense of heart racing  Started at home getting up from a chair then again when she started to fall asleep  Also upper R back had been bothering her for several days   She really panicked  Thought she was dying   At presentation bp was 181/89 Pulse 125 Pulse ox 98%  Labs Results for orders placed or performed during the hospital encounter of 10/01/18  CBC with Differential  Result Value Ref Range   WBC 11.3 (H) 4.0 - 10.5 K/uL   RBC 5.41 (H) 3.87 - 5.11 MIL/uL   Hemoglobin 14.3 12.0 - 15.0 g/dL   HCT 45.1 36.0 - 46.0 %   MCV 83.4 80.0 - 100.0 fL   MCH 26.4 26.0 - 34.0 pg   MCHC 31.7 30.0 - 36.0 g/dL   RDW 14.4 11.5 - 15.5 %   Platelets 156 150 - 400 K/uL   nRBC 0.0 0.0 - 0.2 %   Neutrophils Relative % 60 %   Neutro Abs 6.8 1.7 - 7.7 K/uL   Lymphocytes Relative 28 %   Lymphs Abs 3.1 0.7 - 4.0 K/uL   Monocytes Relative 9 %   Monocytes Absolute 1.0 0.1 - 1.0 K/uL   Eosinophils Relative 2 %   Eosinophils Absolute 0.3 0.0 - 0.5 K/uL   Basophils Relative 1 %   Basophils Absolute 0.1 0.0 - 0.1 K/uL   Immature Granulocytes 0 %   Abs Immature Granulocytes 0.03 0.00 - 0.07 K/uL  Comprehensive metabolic panel  Result Value Ref Range   Sodium 139 135 - 145 mmol/L   Potassium 3.2 (L) 3.5 - 5.1 mmol/L   Chloride 103 98 - 111 mmol/L   CO2 26 22 - 32 mmol/L   Glucose, Bld 127 (H) 70 - 99 mg/dL   BUN 16 6 - 20 mg/dL   Creatinine, Ser 0.96 0.44 - 1.00 mg/dL   Calcium 9.4 8.9 - 10.3 mg/dL   Total Protein 7.6 6.5 - 8.1 g/dL   Albumin 4.4 3.5 - 5.0 g/dL   AST 15 15 - 41 U/L   ALT 17 0 - 44 U/L   Alkaline Phosphatase 94 38 - 126 U/L   Total Bilirubin 0.5 0.3 - 1.2 mg/dL   GFR calc non Af Amer >60 >60 mL/min   GFR calc Af Amer >60 >60 mL/min   Anion gap 10 5 - 15  Troponin I - ONCE - STAT  Result Value Ref Range   Troponin I  <0.03 <0.03 ng/mL  TSH  Result Value Ref Range   TSH 6.998 (H) 0.350 - 4.500 uIU/mL  Fibrin derivatives D-Dimer ([facility] only)  Result Value Ref Range   Fibrin derivatives D-dimer ([facility]) 218.40 0.00 - 499.00 ng/mL (FEU)    EKG showed sinus tach with no acute changes  Dg Chest 2 View  Result Date: 10/02/2018 CLINICAL DATA:  58 year old female with chest pain. EXAM: CHEST - 2 VIEW COMPARISON:  Chest radiograph dated 10/25/2016 FINDINGS: The heart size and mediastinal contours are within normal limits. Both lungs are clear. The visualized skeletal structures are unremarkable. IMPRESSION: No active cardiopulmonary disease. Electronically Signed   By: Anner Crete M.D.   On: 10/02/2018 00:03    Note from provider: Patient's lab work is quite reassuring, mild hypo-thyroidism, unlikely to be contributing to her  symptoms today.  We did have the patient ambulates and she tolerated that quite well with no further dizziness.  Patient is adamant that she wants to go home despite the fact that she continues to have mild tachycardia with ambulating at rest, her heart rate drops back into the 90s.  D-dimer is normal.  She states she has to work in the morning and would like to leave, she agrees to return if anything changes, will have her follow-up with cardiology  Today  Wt Readings from Last 3 Encounters:  10/03/18 199 lb 8 oz (90.5 kg)  10/01/18 200 lb (90.7 kg)  01/16/18 204 lb 12 oz (92.9 kg)   35.34 kg/m   Pulse Readings from Last 3 Encounters:  10/03/18 (!) 102  10/02/18 93  01/16/18 81   BP Readings from Last 3 Encounters:  10/03/18 126/78  10/02/18 (!) 148/88  01/16/18 128/76    Feels lousy in general  Fatigued  Had another episode while watching the news this am    Has cardiology appt 9 am Monday - Dr Ubaldo Glassing   No diarrhea or vomiting  K was 3.2   Daughter says mother has been stressed  Grief is hitting since husband passed  Mother wants to move in with her - and it is very  difficult on her   She avoided caffeine yesterday (usually 2 cups per day) - did get a headache   Lost husband in January to the flu (with pulmonary fibrosis)   Patient Active Problem List   Diagnosis Date Noted  . Tachycardia 10/03/2018  . Grief reaction 10/03/2018  . Elevated glucose level 01/16/2018  . Anxious mood 01/23/2017  . Chest pain 11/02/2016  . Left knee pain 09/13/2015  . Vitamin D deficiency 05/20/2015  . Obesity 05/20/2015  . 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 Screening mammogram, encounter for 10/25/2011  . Routine general medical examination at a health care facility 10/16/2011  . Hyperlipidemia LDL goal <130 12/04/2007  . GERD 12/04/2007  . ELEVATED BLOOD PRESSURE WITHOUT DIAGNOSIS OF HYPERTENSION 12/04/2007   Past Medical History:  Diagnosis Date  . Bursitis, trochanteric    bi/lat   . Dental crowns present    x 4  . GERD (gastroesophageal reflux disease)   . Hyperlipidemia    no current med.  . Hypertension    white coat syndrome,  . Mass of breast, left 01/2012  . Obesity    Past Surgical History:  Procedure Laterality Date  . ABDOMINAL HYSTERECTOMY     partial  . BREAST EXCISIONAL BIOPSY Left 2013  . BREAST SURGERY  01/10/12   lumpectomy - left  . CHOLECYSTECTOMY N/A 02/10/2016   Procedure: LAPAROSCOPIC CHOLECYSTECTOMY WITH INTRAOPERATIVE CHOLANGIOGRAM;  Surgeon: Autumn Messing III, MD;  Location: [place];  Service: General;  Laterality: N/A;  . RECTOCELE REPAIR  06/14/2011   Procedure: POSTERIOR REPAIR (RECTOCELE);  Surgeon: Dutch Gray, MD;  Location: [facility];  Service: Urology;  Laterality: N/A;  cystoscopy, SPARC sling  . ROBOTIC ASSISTED LAPAROSCOPIC SACROCOLPOPEXY  06/14/2011   Procedure: ROBOTIC ASSISTED LAPAROSCOPIC SACROCOLPOPEXY;  Surgeon: Dutch Gray, MD;  Location: [facility];  Service: Urology;  Laterality: N/A;  . TUBAL LIGATION     Social History   Tobacco Use  . Smoking status: Never Smoker  . Smokeless tobacco:  Never Used  Substance Use Topics  . Alcohol use: No    Alcohol/week: 0.0 standard drinks  . Drug use: No   Family History  Problem  Relation Age of Onset  . Hypertension Mother   . Hyperlipidemia Mother   . Diabetes Mother   . Cancer Mother        uterine  . Breast cancer Mother 85  . GER disease Father   . Depression Sister   . Heart disease Brother   . Heart attack Maternal Grandmother   . Hypertension Maternal Grandmother    No Known Allergies Current Outpatient Medications on File Prior to Visit  Medication Sig Dispense Refill  . esomeprazole (NEXIUM) 20 MG capsule Take 20 mg by mouth daily at 12 noon.    Marland Kitchen ibuprofen (ADVIL,MOTRIN) 800 MG tablet TAKE 1 TABLET BY MOUTH EVERY 8 HOURS AS NEEDED FOR PAIN 90 tablet 1   No current facility-administered medications on file prior to visit.     Review of Systems  Constitutional: Negative for activity change, appetite change, fatigue, fever and unexpected weight change.  HENT: Negative for congestion, ear pain, rhinorrhea, sinus pressure and sore throat.   Eyes: Negative for pain, redness and visual disturbance.  Respiratory: Negative for cough, chest tightness, shortness of breath and wheezing.   Cardiovascular: Negative for chest pain, palpitations and leg swelling.       Elevated HR -does not feel palpitations currently  Gastrointestinal: Negative for abdominal pain, blood in stool, constipation and diarrhea.  Endocrine: Negative for polydipsia and polyuria.  Genitourinary: Negative for dysuria, frequency and urgency.  Musculoskeletal: Negative for arthralgias, back pain and myalgias.  Skin: Negative for pallor and rash.  Allergic/Immunologic: Negative for environmental allergies.  Neurological: Negative for dizziness, syncope and headaches.  Hematological: Negative for adenopathy. Does not bruise/bleed easily.  Psychiatric/Behavioral: Positive for sleep disturbance. Negative for confusion, decreased concentration and dysphoric  mood. The patient is nervous/anxious.        Objective:   Physical Exam Constitutional:      General: She is not in acute distress.    Appearance: Normal appearance. She is well-developed. She is obese. She is not ill-appearing.  HENT:     Head: Normocephalic and atraumatic.     Nose: Nose normal.     Mouth/Throat:     Mouth: Mucous membranes are moist.     Pharynx: Oropharynx is clear.  Eyes:     General: No scleral icterus.    Conjunctiva/sclera: Conjunctivae normal.     Pupils: Pupils are equal, round, and reactive to light.  Neck:     Musculoskeletal: Normal range of motion and neck supple.     Thyroid: No thyromegaly.     Vascular: No carotid bruit or JVD.  Cardiovascular:     Rate and Rhythm: Regular rhythm. Tachycardia present.     Pulses: Normal pulses.     Heart sounds: Normal heart sounds. No gallop.   Pulmonary:     Effort: Pulmonary effort is normal. No respiratory distress.     Breath sounds: Normal breath sounds. No wheezing or rales.  Abdominal:     General: Bowel sounds are normal. There is no distension or abdominal bruit.     Palpations: Abdomen is soft. There is no mass.     Tenderness: There is no abdominal tenderness.  Musculoskeletal:     Right lower leg: No edema.     Left lower leg: No edema.  Lymphadenopathy:     Cervical: No cervical adenopathy.  Skin:    General: Skin is warm and dry.     Findings: No rash.  Neurological:     General: No focal deficit present.  Mental Status: She is alert.     Deep Tendon Reflexes: Reflexes are normal and symmetric.  Psychiatric:        Mood and Affect: Mood is anxious. Affect is tearful.        Speech: Speech normal.        Behavior: Behavior normal.        Thought Content: Thought content normal. Thought content is not paranoid. Thought content does not include homicidal or suicidal ideation.        Cognition and Memory: Cognition normal.     Comments: Tearful at times when discussing grief and  loss Admittedly anxious  Pleasant with nl cognition  Supportive daughter present            Assessment & Plan:   Problem List Items Addressed This Visit      Other   Chest pain    No cp currently      Anxious mood    With some suspected panic attacks Related to grief-see A/P for that  buspar px  Plan to start counseling  Good insight       Relevant Medications   busPIRone (BUSPAR) 15 MG tablet   Tachycardia    Still mildly tachycardic but also anxous and somewhat tearful talking about grief and mood Reviewed hospital records, lab results and studies in detail   Reassuring eval for rapid HR/dizziness In restrospect pt does wonder if she is having panic attacks  She does have cardiology f/u next week       Grief reaction - Primary    Loss of husband in January is starting to hit her  Reviewed stressors/ coping techniques/symptoms/ support sources/ tx options and side effects in detail today Suspect she is exp some panic attacks and anxiety  Disc tx options She will pursue counseling herself (has counselor in mind in [place])-will call for ref if needed Supportive family (daughter is present) Trial of buspar 7.5 mg bid (titrate if needed)  Discussed expectations of this medication including time to effectiveness and mechanism of action, also poss of side effects (early and late)- including mental fuzziness, weight or appetite change, nausea and poss of worse dep or anxiety (even suicidal thoughts)  Pt voiced understanding and will stop med and update if this occurs   Stressed imp of self care and need to take time off work if needed F/u prn

## 2018-10-03 NOTE — Patient Instructions (Addendum)
Get started with counseling  Let us know if you need a referral   Try buspar 1/2 pill twice daily  If any intolerable side effects or if you feel worse instead of better please let me know   We can go up on the dose later if needed   Keep talking to your family   If you decide you need time off for grief - let us know  See cardiology as planned

## 2018-10-05 NOTE — Assessment & Plan Note (Signed)
No cp currently

## 2018-10-05 NOTE — Assessment & Plan Note (Signed)
Still mildly tachycardic but also anxous and somewhat tearful talking about grief and mood Reviewed hospital records, lab results and studies in detail   Reassuring eval for rapid HR/dizziness In restrospect pt does wonder if she is having panic attacks  She does have cardiology f/u next week

## 2018-10-05 NOTE — Assessment & Plan Note (Signed)
With some suspected panic attacks Related to grief-see A/P for that  buspar px  Plan to start counseling  Good insight

## 2018-10-05 NOTE — Assessment & Plan Note (Signed)
Loss of husband in January is starting to hit her  Reviewed stressors/ coping techniques/symptoms/ support sources/ tx options and side effects in detail today Suspect she is exp some panic attacks and anxiety  Disc tx options She will pursue counseling herself (has counselor in mind in [place])-will call for ref if needed Supportive family (daughter is present) Trial of buspar 7.5 mg bid (titrate if needed)  Discussed expectations of this medication including time to effectiveness and mechanism of action, also poss of side effects (early and late)- including mental fuzziness, weight or appetite change, nausea and poss of worse dep or anxiety (even suicidal thoughts)  Pt voiced understanding and will stop med and update if this occurs   Stressed imp of self care and need to take time off work if needed F/u prn

## 2018-10-17 ENCOUNTER — Telehealth: Payer: Self-pay

## 2018-10-17 MED ORDER — AMLODIPINE BESYLATE 2.5 MG PO TABS: 2.5000 mg | ORAL_TABLET | Freq: Every day | ORAL | 3 refills | Status: DC

## 2018-10-17 NOTE — Telephone Encounter (Addendum)
Pt left v/m last seen 10/03/18; pt saw cardiology and BP was elevated; pt was advised to ck BP and call PCP. Pt said BP averaging 140-150's over 80-90's. I was unable to reach pt by phone to get any symptoms. Pt called back; Pt is having slight h/as on and off. Pt also gets light headed at times. No CP or SOB.Today at 12:30 pt felt light headed and took BP on wrist cuff and BP was 158/98. Pt took Tylenol and is feeling better. BP now is 145/88. Pt is not taking any BP med. Pt is under a lot of stress since passing of husband. [employer] [address]. Pt request cb with what to do. Right now pt feels OK.

## 2018-10-17 NOTE — Telephone Encounter (Signed)
I pended a px for low dose amlodipine 2.5 mg once daily  Let her know it is mild Send if she wants to try it   Stop if side effects and alert me

## 2018-10-17 NOTE — Telephone Encounter (Signed)
Pt notified of Dr. Tower's comments and Rx sent to pharmacy  

## 2018-10-17 NOTE — Telephone Encounter (Signed)
Left VM requesting pt to call the office back 

## 2018-11-10 ENCOUNTER — Encounter: Payer: Self-pay | Admitting: Family Medicine

## 2018-11-10 ENCOUNTER — Ambulatory Visit: Payer: Managed Care, Other (non HMO) | Admitting: Family Medicine

## 2018-11-10 ENCOUNTER — Other Ambulatory Visit: Payer: Self-pay

## 2018-11-10 VITALS — BP 126/82 | HR 78 | Temp 98.2°F | Ht 60.0 in | Wt 201.1 lb

## 2018-11-10 DIAGNOSIS — R300 Dysuria: Secondary | ICD-10-CM | POA: Diagnosis not present

## 2018-11-10 LAB — POC URINALSYSI DIPSTICK (AUTOMATED)
Bilirubin, UA: NEGATIVE
Blood, UA: NEGATIVE
Glucose, UA: NEGATIVE
Ketones, UA: NEGATIVE
Leukocytes, UA: NEGATIVE
Nitrite, UA: NEGATIVE
Protein, UA: NEGATIVE
Spec Grav, UA: 1.01 (ref 1.010–1.025)
Urobilinogen, UA: 0.2 E.U./dL
pH, UA: 6 (ref 5.0–8.0)

## 2018-11-10 MED ORDER — CEPHALEXIN 500 MG PO CAPS: 500.0000 mg | ORAL_CAPSULE | Freq: Two times a day (BID) | ORAL | 0 refills | Status: DC

## 2018-11-10 NOTE — Assessment & Plan Note (Signed)
Classic uti symptoms with negative UA Sent for culture  tx with 5 d of keflex Enc water intake and inst to avoid other beverages Disc s/s of urethritis and discussed ways to avoid as well  Update if not starting to improve in a week or if worsening

## 2018-11-10 NOTE — Progress Notes (Signed)
Subjective:    Patient ID: Makayla [name], female    DOB: May 01, 1961, 58 y.o.   MRN: [id number]  HPI  Here for urinary symptoms   Burning to urinate Frequency   Woke up about 12:30 am and had to urinate urgently  Had to sit for a few seconds before she started  After she finished felt burning/stinging  That happened about every 30 minutes  Then slept at 3 am  Has had burning all day   occ nausea  Not feverish  She is drinking lots of water   This started after drinking a big cup of regular coffee (usually does decaf)    No itching or vaginal d/c   Has never had a uti before   UA given today Fairly clear  Results for orders placed or performed in visit on 11/10/18  POCT Urinalysis Dipstick (Automated)  Result Value Ref Range   Color, UA Straw    Clarity, UA Clear    Glucose, UA Negative Negative   Bilirubin, UA Negative    Ketones, UA Negative    Spec Grav, UA 1.010 1.010 - 1.025   Blood, UA Negative    pH, UA 6.0 5.0 - 8.0   Protein, UA Negative Negative   Urobilinogen, UA 0.2 0.2 or 1.0 E.U./dL   Nitrite, UA Negative    Leukocytes, UA Negative Negative    Patient Active Problem List   Diagnosis Date Noted  . Dysuria 11/10/2018  . Tachycardia 10/03/2018  . Grief reaction 10/03/2018  . Elevated glucose level 01/16/2018  . Anxious mood 01/23/2017  . Chest pain 11/02/2016  . Left knee pain 09/13/2015  . Vitamin D deficiency 05/20/2015  . Obesity 05/20/2015  . 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 Screening mammogram, encounter for 10/25/2011  . Routine general medical examination at a health care facility 10/16/2011  . Hyperlipidemia LDL goal <130 12/04/2007  . GERD 12/04/2007  . ELEVATED BLOOD PRESSURE WITHOUT DIAGNOSIS OF HYPERTENSION 12/04/2007   Past Medical History:  Diagnosis Date  . Bursitis, trochanteric    bi/lat   . Dental crowns present    x 4  . GERD (gastroesophageal reflux disease)   . Hyperlipidemia    no  current med.  . Hypertension    white coat syndrome,  . Mass of breast, left 01/2012  . Obesity    Past Surgical History:  Procedure Laterality Date  . ABDOMINAL HYSTERECTOMY     partial  . BREAST EXCISIONAL BIOPSY Left 2013  . BREAST SURGERY  01/10/12   lumpectomy - left  . CHOLECYSTECTOMY N/A 02/10/2016   Procedure: LAPAROSCOPIC CHOLECYSTECTOMY WITH INTRAOPERATIVE CHOLANGIOGRAM;  Surgeon: Autumn Messing III, MD;  Location: [facility];  Service: General;  Laterality: N/A;  . RECTOCELE REPAIR  06/14/2011   Procedure: POSTERIOR REPAIR (RECTOCELE);  Surgeon: Dutch Gray, MD;  Location: [facility];  Service: Urology;  Laterality: N/A;  cystoscopy, SPARC sling  . ROBOTIC ASSISTED LAPAROSCOPIC SACROCOLPOPEXY  06/14/2011   Procedure: ROBOTIC ASSISTED LAPAROSCOPIC SACROCOLPOPEXY;  Surgeon: Dutch Gray, MD;  Location: [facility];  Service: Urology;  Laterality: N/A;  . TUBAL LIGATION     Social History   Tobacco Use  . Smoking status: Never Smoker  . Smokeless tobacco: Never Used  Substance Use Topics  . Alcohol use: No    Alcohol/week: 0.0 standard drinks  . Drug use: No   Family History  Problem Relation Age of Onset  . Hypertension Mother   . Hyperlipidemia Mother   .  Diabetes Mother   . Cancer Mother        uterine  . Breast cancer Mother 36  . GER disease Father   . Depression Sister   . Heart disease Brother   . Heart attack Maternal Grandmother   . Hypertension Maternal Grandmother    No Known Allergies Current Outpatient Medications on File Prior to Visit  Medication Sig Dispense Refill  . amLODipine (NORVASC) 2.5 MG tablet Take 1 tablet (2.5 mg total) by mouth daily. 90 tablet 3  . esomeprazole (NEXIUM) 20 MG capsule Take 20 mg by mouth daily at 12 noon.    Marland Kitchen ibuprofen (ADVIL,MOTRIN) 800 MG tablet TAKE 1 TABLET BY MOUTH EVERY 8 HOURS AS NEEDED FOR PAIN 90 tablet 1  . busPIRone (BUSPAR) 15 MG tablet Take 0.5 tablets (7.5 mg total) by mouth 2 (two) times daily. (Patient not taking: Reported on  11/10/2018) 30 tablet 11   No current facility-administered medications on file prior to visit.      Review of Systems  Constitutional: Positive for fatigue. Negative for activity change, appetite change and fever.  HENT: Negative for congestion and sore throat.   Eyes: Negative for itching and visual disturbance.  Respiratory: Negative for cough and shortness of breath.   Cardiovascular: Negative for leg swelling.  Gastrointestinal: Negative for abdominal distention, abdominal pain, constipation, diarrhea and nausea.  Endocrine: Negative for cold intolerance and polydipsia.  Genitourinary: Positive for dysuria, frequency and urgency. Negative for difficulty urinating, flank pain and hematuria.  Musculoskeletal: Negative for myalgias.       No flank pain   Skin: Negative for rash.  Allergic/Immunologic: Negative for immunocompromised state.  Neurological: Negative for dizziness and weakness.  Hematological: Negative for adenopathy.       Objective:   Physical Exam Constitutional:      General: She is not in acute distress.    Appearance: She is well-developed. She is obese. She is not ill-appearing.  HENT:     Head: Normocephalic and atraumatic.  Eyes:     Conjunctiva/sclera: Conjunctivae normal.     Pupils: Pupils are equal, round, and reactive to light.  Neck:     Musculoskeletal: Normal range of motion and neck supple.  Cardiovascular:     Rate and Rhythm: Normal rate and regular rhythm.     Heart sounds: Normal heart sounds.  Pulmonary:     Effort: Pulmonary effort is normal.     Breath sounds: Normal breath sounds.  Abdominal:     General: Bowel sounds are normal. There is no distension.     Palpations: Abdomen is soft.     Tenderness: There is abdominal tenderness. There is no rebound.     Comments: No cva tenderness  Mild suprapubic tenderness (little if any)   Lymphadenopathy:     Cervical: No cervical adenopathy.  Skin:    Findings: No rash.  Neurological:      Mental Status: She is alert.           Assessment & Plan:   Problem List Items Addressed This Visit      Other   Dysuria - Primary    Classic uti symptoms with negative UA Sent for culture  tx with 5 d of keflex Enc water intake and inst to avoid other beverages Disc s/s of urethritis and discussed ways to avoid as well  Update if not starting to improve in a week or if worsening         Relevant Orders  POCT Urinalysis Dipstick (Automated) (Completed)   Urine Culture

## 2018-11-10 NOTE — Patient Instructions (Addendum)
Keep drinking lots of water  Urine looks clear but I want to culture it   Avoid any beverages but water  Especially coffee or diet drinks  Also avoid spicy food Do not use soap in the genital area (water only)  Showers instead of baths   Take keflex as directed  Update if not starting to improve in a week or if worsening    We will call /email with culture results

## 2018-11-12 ENCOUNTER — Telehealth: Payer: Self-pay | Admitting: *Deleted

## 2018-11-12 LAB — URINE CULTURE
MICRO NUMBER:: 377454
SPECIMEN QUALITY:: ADEQUATE

## 2018-11-12 NOTE — Telephone Encounter (Signed)
Left VM requesting pt to call office back regarding lab(urine cx) results

## 2018-11-19 NOTE — Telephone Encounter (Signed)
Addressed through result notes  

## 2018-12-04 ENCOUNTER — Other Ambulatory Visit: Payer: Self-pay

## 2018-12-04 ENCOUNTER — Encounter: Payer: Self-pay | Admitting: Adult Health

## 2018-12-04 ENCOUNTER — Ambulatory Visit: Payer: Managed Care, Other (non HMO) | Admitting: Adult Health

## 2018-12-04 DIAGNOSIS — Z20822 Contact with and (suspected) exposure to covid-19: Secondary | ICD-10-CM

## 2018-12-04 DIAGNOSIS — R6889 Other general symptoms and signs: Secondary | ICD-10-CM | POA: Diagnosis not present

## 2018-12-04 DIAGNOSIS — J029 Acute pharyngitis, unspecified: Secondary | ICD-10-CM | POA: Diagnosis not present

## 2018-12-04 NOTE — Progress Notes (Signed)
Virtual Visit via Telephone Note  I connected with Makayla [name] on 12/04/18 at  2:45 PM EDT by telephone and verified that I am speaking with the correct person using two identifiers.  Location: Patient: at home  Provider: at office    I discussed the limitations, risks, security and privacy concerns of performing an evaluation and management service by telephone and the availability of in person appointments. I also discussed with the patient that there may be a patient responsible charge related to this service. The patient expressed understanding and agreed to proceed.  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    Onset of symptoms:  12/03/2018  Denies any mediations. Started with ache in  throat. Denies any problems swallowing. Fatigue- mild think due to increased staff load. She is a nurse   My Chart screenings sent to patient for evaluation but she decide to answer on the phone with provider.   Patient  denies any fever, body aches,chills, rash, chest pain, shortness of breath, nausea, vomiting, or diarrhea. Denies cough.   Denies any white patches in the back of her throat.   Patient Active Problem List   Diagnosis Date Noted  . Dysuria 11/10/2018  . Tachycardia 10/03/2018  . Grief reaction 10/03/2018  . Elevated glucose level 01/16/2018  . Anxious mood 01/23/2017  . Chest pain 11/02/2016  . Left knee pain 09/13/2015  . Vitamin D deficiency 05/20/2015  . Obesity 05/20/2015  . 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 Screening mammogram, encounter for 10/25/2011  . Routine general medical examination at a health care facility 10/16/2011  . Hyperlipidemia LDL  goal <130 12/04/2007  . GERD 12/04/2007  . ELEVATED BLOOD PRESSURE WITHOUT DIAGNOSIS OF HYPERTENSION 12/04/2007  . Essential hypertension 12/04/2007    Current Outpatient Medications:  .  amLODipine (NORVASC) 2.5 MG tablet, Take 1 tablet (2.5 mg total) by mouth daily., Disp: 90 tablet, Rfl: 3 .  busPIRone (BUSPAR) 15 MG tablet, Take 0.5 tablets (7.5 mg total) by mouth 2 (two) times daily. (Patient not taking: Reported on 11/10/2018), Disp: 30 tablet, Rfl: 11 .  esomeprazole (NEXIUM) 20 MG capsule, Take 20 mg by mouth daily at 12 noon., Disp: , Rfl:  .  ibuprofen (ADVIL,MOTRIN) 800 MG tablet, TAKE 1 TABLET BY MOUTH EVERY 8 HOURS AS NEEDED FOR PAIN, Disp: 90 tablet, Rfl: 1   Observations/Objective:  Patient is alert and oriented and responsive to questions Engages in conversation with with provider. Speaks in full sentences without any pauses without any shortness of breath or distress.   She is pleasant and in no distress - she reports she has not had a fever today.    Assessment and Plan: Sorethroat  Suspected Covid-19 Virus Infection- can not rule out.    Follow Up Instructions:   Discussed with patient that we have limited resources for any outpatient testing at [facility] and that should she need emergency care she will go to the emergency room should her symptoms worsen.  Provider also called patient's primary care office and spoke with [facility] front office staff and was advised that patient testing at their side is not  occurring either.  Patient was advised of this as well.   She is advised [facility] is testing Recommend 14 day quarantine and not returning to work even at that time if symptoms- must be symptom free for at least three days without any medications.   I discussed the assessment and treatment plan with the patient. The patient was provided an opportunity to ask questions and all were answered. The patient agreed with the plan and demonstrated an understanding  of the instructions.   The patient was advised to call back or seek an in-person evaluation if the symptoms worsen or if the condition fails to improve as anticipated. Patient is aware that her after visit summary will be in her MyChart and that she does not have to complete questionnaire since she was asked questions on the telephone by provider.  I provided 10 minutes of non-face-to-face time during this encounter.   Marcille Buffy, FNP

## 2018-12-04 NOTE — Patient Instructions (Addendum)
This would as we discussed I cannot test you for COVID-19 at this time, you have mild symptoms and we are advising patients with mild symptoms to stay at home unless symptoms become moderate to severe.  Should any symptoms become worse please go to the nearest emergency room. You do not have to answer the questionnaires sent to your MyChart since we went over all of this on the telephone.  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 I am sending a note to your my chart for your work.  I also checked with your primary care office and they are not testing either as they are a [place] site as well.  We also discussed possibilities of being tested at [facility] if you choose to do so.    Sore Throat When you have a sore throat, your throat may feel:  Tender.  Burning.  Irritated.  Scratchy.  Painful when you swallow.  Painful when you talk. Many things can cause a sore throat, such as:  An infection.  Allergies.  Dry air.  Smoke or pollution.  Radiation treatment.  Gastroesophageal reflux disease (GERD).  A tumor. A sore throat can be the first sign of another sickness. It can happen with other problems, like:  Coughing.  Sneezing.  Fever.  Swelling in the neck. Most sore throats go away without treatment. Follow these instructions at home:      Take over-the-counter medicines only as told by your doctor. ? If your child has a sore throat, do not give your child aspirin.  Drink enough fluids to keep your pee (urine) pale yellow.  Rest when you feel you need to.  To help with pain: ? Sip warm liquids, such as broth, herbal tea, or warm water. ? Eat or drink cold or frozen liquids, such as frozen ice pops. ? Gargle with a salt-water mixture 3-4 times a day or as needed. To make a  salt-water mixture, add -1 tsp (3-6 g) of salt to 1 cup (237 mL) of warm water. Mix it until you cannot see the salt anymore. ? Suck on hard candy or throat lozenges. ? Put a cool-mist humidifier in your bedroom at night. ? Sit in the bathroom with the door closed for 5-10 minutes while you run hot water in the shower.  Do not use any products that contain nicotine or tobacco, such as cigarettes, e-cigarettes, and chewing tobacco. If you need help quitting, ask your doctor.  Wash your hands well and often with soap and water. If soap and water are not available, use hand sanitizer. Contact a doctor if:  You have a fever for more than 2-3 days.  You keep having symptoms for more than 2-3 days.  Your throat does not get better in 7 days.  You have a fever and your symptoms suddenly get worse.  Your child who is 3 months to 38 years old has a temperature of 102.40F (39C) or higher. Get help right away if:  You have trouble breathing.  You cannot swallow fluids, soft foods, or your saliva.  You have swelling in your throat or neck that gets worse.  You keep feeling sick to your stomach (nauseous).  You keep throwing up (vomiting). Summary  A sore throat is pain, burning, irritation, or scratchiness in the  throat. Many things can cause a sore throat.  Take over-the-counter medicines only as told by your doctor. Do not give your child aspirin.  Drink plenty of fluids, and rest as needed.  Contact a doctor if your symptoms get worse or your sore throat does not get better within 7 days. This information is not intended to replace advice given to you by your health care provider. Make sure you discuss any questions you have with your health care provider. Document Released: 05/01/2008 Document Revised: 12/23/2017 Document Reviewed: 12/23/2017 Elsevier Interactive Patient Education  2019 [place] Under Monitoring Name: Makayla [name]  Location: [address] [place] [address]   Infection Prevention Recommendations for Individuals Confirmed to have, or Being Evaluated for, 2019 Novel Coronavirus (COVID-19) Infection Who Receive Care at Home  Individuals who are confirmed to have, or are being evaluated for, COVID-19 should follow the prevention steps below until a healthcare provider or local or state health department says they can return to normal activities.  Stay home except to get medical care You should restrict activities outside your home, except for getting medical care. Do not go to work, school, or public areas, and do not use public transportation or taxis.  Call ahead before visiting your doctor Before your medical appointment, call the healthcare provider and tell them that you have, or are being evaluated for, COVID-19 infection. This will help the healthcare provider's office take steps to keep other people from getting infected. Ask your healthcare provider to call the local or state health department.  Monitor your symptoms Seek prompt medical attention if your illness is worsening (e.g., difficulty breathing). Before going to your medical appointment, call the healthcare provider and tell them that you have, or are being evaluated for, COVID-19 infection. Ask your healthcare provider to call the local or state health department.  Wear a facemask You should wear a facemask that covers your nose and mouth when you are in the same room with other people and when you visit a healthcare provider. People who live with or visit you should also wear a facemask while they are in the same room with you.  Separate yourself from other people in your home As much as possible, you should stay in a different room from other people in your home. Also, you should use a separate bathroom, if available.  Avoid sharing household items You should not share dishes, drinking glasses, cups, eating utensils, towels, bedding, or other  items with other people in your home. After using these items, you should wash them thoroughly with soap and water.  Cover your coughs and sneezes Cover your mouth and nose with a tissue when you cough or sneeze, or you can cough or sneeze into your sleeve. Throw used tissues in a lined trash can, and immediately wash your hands with soap and water for at least 20 seconds or use an alcohol-based hand rub.  Wash your [facility] your hands often and thoroughly with soap and water for at least 20 seconds. You can use an alcohol-based hand sanitizer if soap and water are not available and if your hands are not visibly dirty. Avoid touching your eyes, nose, and mouth with unwashed hands.   Prevention Steps for Caregivers and Household Members of Individuals Confirmed to have, or Being Evaluated for, COVID-19 Infection Being Cared for in the Home  If you live with, or provide care at home for, a  person confirmed to have, or being evaluated for, COVID-19 infection please follow these guidelines to prevent infection:  Follow healthcare provider's instructions Make sure that you understand and can help the patient follow any healthcare provider instructions for all care.  Provide for the patient's basic needs You should help the patient with basic needs in the home and provide support for getting groceries, prescriptions, and other personal needs.  Monitor the patient's symptoms If they are getting sicker, call his or her medical provider and tell them that the patient has, or is being evaluated for, COVID-19 infection. This will help the healthcare provider's office take steps to keep other people from getting infected. Ask the healthcare provider to call the local or state health department.  Limit the number of people who have contact with the patient  If possible, have only one caregiver for the patient.  Other household members should stay in another home or place of residence. If  this is not possible, they should stay  in another room, or be separated from the patient as much as possible. Use a separate bathroom, if available.  Restrict visitors who do not have an essential need to be in the home.  Keep older adults, very young children, and other sick people away from the patient Keep older adults, very young children, and those who have compromised immune systems or chronic health conditions away from the patient. This includes people with chronic heart, lung, or kidney conditions, diabetes, and cancer.  Ensure good ventilation Make sure that shared spaces in the home have good air flow, such as from an air conditioner or an opened window, weather permitting.  Wash your hands often  Wash your hands often and thoroughly with soap and water for at least 20 seconds. You can use an alcohol based hand sanitizer if soap and water are not available and if your hands are not visibly dirty.  Avoid touching your eyes, nose, and mouth with unwashed hands.  Use disposable paper towels to dry your hands. If not available, use dedicated cloth towels and replace them when they become wet.  Wear a facemask and gloves  Wear a disposable facemask at all times in the room and gloves when you touch or have contact with the patient's blood, body fluids, and/or secretions or excretions, such as sweat, saliva, sputum, nasal mucus, vomit, urine, or feces.  Ensure the mask fits over your nose and mouth tightly, and do not touch it during use.  Throw out disposable facemasks and gloves after using them. Do not reuse.  Wash your hands immediately after removing your facemask and gloves.  If your personal clothing becomes contaminated, carefully remove clothing and launder. Wash your hands after handling contaminated clothing.  Place all used disposable facemasks, gloves, and other waste in a lined container before disposing them with other household waste.  Remove gloves and wash  your hands immediately after handling these items.  Do not share dishes, glasses, or other household items with the patient  Avoid sharing household items. You should not share dishes, drinking glasses, cups, eating utensils, towels, bedding, or other items with a patient who is confirmed to have, or being evaluated for, COVID-19 infection.  After the person uses these items, you should wash them thoroughly with soap and water.  Wash laundry thoroughly  Immediately remove and wash clothes or bedding that have blood, body fluids, and/or secretions or excretions, such as sweat, saliva, sputum, nasal mucus, vomit, urine, or feces, on them.  Wear gloves when handling laundry from the patient.  Read and follow directions on labels of laundry or clothing items and detergent. In general, wash and dry with the warmest temperatures recommended on the label.  Clean all areas the individual has used often  Clean all touchable surfaces, such as counters, tabletops, doorknobs, bathroom fixtures, toilets, phones, keyboards, tablets, and bedside tables, every day. Also, clean any surfaces that may have blood, body fluids, and/or secretions or excretions on them.  Wear gloves when cleaning surfaces the patient has come in contact with.  Use a diluted bleach solution (e.g., dilute bleach with 1 part bleach and 10 parts water) or a household disinfectant with a label that says EPA-registered for coronaviruses. To make a bleach solution at home, add 1 tablespoon of bleach to 1 quart (4 cups) of water. For a larger supply, add  cup of bleach to 1 gallon (16 cups) of water.  Read labels of cleaning products and follow recommendations provided on product labels. Labels contain instructions for safe and effective use of the cleaning product including precautions you should take when applying the product, such as wearing gloves or eye protection and making sure you have good ventilation during use of the product.   Remove gloves and wash hands immediately after cleaning.  Monitor yourself for signs and symptoms of illness Caregivers and household members are considered close contacts, should monitor their health, and will be asked to limit movement outside of the home to the extent possible. Follow the monitoring steps for close contacts listed on the symptom monitoring form.   ? If you have additional questions, contact your local health department or call the epidemiologist on call at [phone] (available 24/7). ? This guidance is subject to change. For the most up-to-date guidance from [facility], please refer to their website: YouBlogs.pl

## 2018-12-08 NOTE — Progress Notes (Signed)
  Tower, Wynelle Fanny, MD  Deedra Pro, Kelby Aline, FNP        No-unfortunately we do not have tests either  I have had a patient get a test at the [facility]. Unsure if other health dept. May have testing?       Message from patients primary care- they also do not have cOVID 19 TESTING AS WELL. [facility] department is also not testing.

## 2018-12-08 NOTE — Progress Notes (Signed)
Message  Received: 3 days ago  Rosebud, MD  Flinchum, Kelby Aline, FNP        No-unfortunately we do not have tests either  I have had a patient get a test at the [facility]. Unsure if other health dept. May have testing?    Patients primary care also does not have access to testing for Covid 19.

## 2019-01-08 ENCOUNTER — Encounter: Payer: Self-pay | Admitting: Family Medicine

## 2019-01-08 MED ORDER — NYSTATIN 100000 UNIT/GM EX POWD: Freq: Four times a day (QID) | CUTANEOUS | 2 refills | Status: DC

## 2019-04-15 ENCOUNTER — Telehealth: Payer: Self-pay | Admitting: Family Medicine

## 2019-04-15 DIAGNOSIS — R7309 Other abnormal glucose: Secondary | ICD-10-CM

## 2019-04-15 DIAGNOSIS — E559 Vitamin D deficiency, unspecified: Secondary | ICD-10-CM

## 2019-04-15 DIAGNOSIS — E785 Hyperlipidemia, unspecified: Secondary | ICD-10-CM

## 2019-04-15 DIAGNOSIS — I10 Essential (primary) hypertension: Secondary | ICD-10-CM

## 2019-04-15 NOTE — Telephone Encounter (Signed)
-----   Message from Ellamae Sia sent at 04/07/2019 11:07 AM EDT ----- Regarding: Lab orders for Thursday, 9.10.20 Patient is scheduled for CPX labs, please order future labs, Thanks , Karna Christmas

## 2019-04-16 ENCOUNTER — Other Ambulatory Visit: Payer: Self-pay

## 2019-04-16 ENCOUNTER — Other Ambulatory Visit (INDEPENDENT_AMBULATORY_CARE_PROVIDER_SITE_OTHER): Payer: Managed Care, Other (non HMO)

## 2019-04-16 DIAGNOSIS — E559 Vitamin D deficiency, unspecified: Secondary | ICD-10-CM | POA: Diagnosis not present

## 2019-04-16 DIAGNOSIS — R7309 Other abnormal glucose: Secondary | ICD-10-CM | POA: Diagnosis not present

## 2019-04-16 DIAGNOSIS — E785 Hyperlipidemia, unspecified: Secondary | ICD-10-CM | POA: Diagnosis not present

## 2019-04-16 DIAGNOSIS — I10 Essential (primary) hypertension: Secondary | ICD-10-CM

## 2019-04-16 LAB — COMPREHENSIVE METABOLIC PANEL
ALT: 15 U/L (ref 0–35)
AST: 12 U/L (ref 0–37)
Albumin: 4 g/dL (ref 3.5–5.2)
Alkaline Phosphatase: 84 U/L (ref 39–117)
BUN: 13 mg/dL (ref 6–23)
CO2: 29 mEq/L (ref 19–32)
Calcium: 9.4 mg/dL (ref 8.4–10.5)
Chloride: 103 mEq/L (ref 96–112)
Creatinine, Ser: 0.89 mg/dL (ref 0.40–1.20)
GFR: 64.9 mL/min (ref >60.00)
Glucose, Bld: 106 mg/dL — ABNORMAL HIGH (ref 70–99)
Potassium: 4.1 mEq/L (ref 3.5–5.1)
Sodium: 139 mEq/L (ref 135–145)
Total Bilirubin: 0.6 mg/dL (ref 0.2–1.2)
Total Protein: 6.8 g/dL (ref 6.0–8.3)

## 2019-04-16 LAB — LIPID PANEL
Cholesterol: 193 mg/dL (ref 0–200)
HDL: 51.4 mg/dL (ref >39.00)
LDL Cholesterol: 124 mg/dL — ABNORMAL HIGH (ref 0–99)
NonHDL: 141.6
Total CHOL/HDL Ratio: 4
Triglycerides: 86 mg/dL (ref 0.0–149.0)
VLDL: 17.2 mg/dL (ref 0.0–40.0)

## 2019-04-16 LAB — CBC WITH DIFFERENTIAL/PLATELET
Basophils Absolute: 0.1 10*3/uL (ref 0.0–0.1)
Basophils Relative: 0.9 % (ref 0.0–3.0)
Eosinophils Absolute: 0.2 10*3/uL (ref 0.0–0.7)
Eosinophils Relative: 2.3 % (ref 0.0–5.0)
HCT: 42 % (ref 36.0–46.0)
Hemoglobin: 13.9 g/dL (ref 12.0–15.0)
Lymphocytes Relative: 24.7 % (ref 12.0–46.0)
Lymphs Abs: 1.7 10*3/uL (ref 0.7–4.0)
MCHC: 32.9 g/dL (ref 30.0–36.0)
MCV: 82.4 fl (ref 78.0–100.0)
Monocytes Absolute: 0.6 10*3/uL (ref 0.1–1.0)
Monocytes Relative: 8 % (ref 3.0–12.0)
Neutro Abs: 4.5 10*3/uL (ref 1.4–7.7)
Neutrophils Relative %: 64.1 % (ref 43.0–77.0)
Platelets: 133 10*3/uL — ABNORMAL LOW (ref 150.0–400.0)
RBC: 5.1 Mil/uL (ref 3.87–5.11)
RDW: 14.7 % (ref 11.5–15.5)
WBC: 7.1 10*3/uL (ref 4.0–10.5)

## 2019-04-16 LAB — VITAMIN D 25 HYDROXY (VIT D DEFICIENCY, FRACTURES): VITD: 21.02 ng/mL — ABNORMAL LOW (ref 30.00–100.00)

## 2019-04-16 LAB — TSH: TSH: 2.63 u[IU]/mL (ref 0.35–4.50)

## 2019-04-16 LAB — HEMOGLOBIN A1C: Hgb A1c MFr Bld: 5.7 % (ref 4.6–6.5)

## 2019-04-23 ENCOUNTER — Encounter: Payer: Self-pay | Admitting: Family Medicine

## 2019-04-23 ENCOUNTER — Ambulatory Visit (INDEPENDENT_AMBULATORY_CARE_PROVIDER_SITE_OTHER): Payer: Managed Care, Other (non HMO) | Admitting: Family Medicine

## 2019-04-23 ENCOUNTER — Other Ambulatory Visit: Payer: Self-pay

## 2019-04-23 VITALS — BP 118/74 | HR 58 | Temp 97.4°F | Ht 64.0 in | Wt 204.6 lb

## 2019-04-23 DIAGNOSIS — Z853 Personal history of malignant neoplasm of breast: Secondary | ICD-10-CM | POA: Diagnosis not present

## 2019-04-23 DIAGNOSIS — E785 Hyperlipidemia, unspecified: Secondary | ICD-10-CM

## 2019-04-23 DIAGNOSIS — Z6835 Body mass index (BMI) 35.0-35.9, adult: Secondary | ICD-10-CM

## 2019-04-23 DIAGNOSIS — R7309 Other abnormal glucose: Secondary | ICD-10-CM | POA: Diagnosis not present

## 2019-04-23 DIAGNOSIS — Z0000 Encounter for general adult medical examination without abnormal findings: Secondary | ICD-10-CM

## 2019-04-23 DIAGNOSIS — I10 Essential (primary) hypertension: Secondary | ICD-10-CM | POA: Diagnosis not present

## 2019-04-23 DIAGNOSIS — Z1231 Encounter for screening mammogram for malignant neoplasm of breast: Secondary | ICD-10-CM

## 2019-04-23 DIAGNOSIS — E559 Vitamin D deficiency, unspecified: Secondary | ICD-10-CM

## 2019-04-23 DIAGNOSIS — E6609 Other obesity due to excess calories: Secondary | ICD-10-CM

## 2019-04-23 MED ORDER — AMLODIPINE BESYLATE 2.5 MG PO TABS: 2.5000 mg | ORAL_TABLET | Freq: Every day | ORAL | 3 refills | Status: DC

## 2019-04-23 NOTE — Assessment & Plan Note (Signed)
bp in fair control at this time  BP Readings from Last 1 Encounters:  04/23/19 118/74   No changes needed Most recent labs reviewed  Disc lifstyle change with low sodium diet and exercise

## 2019-04-23 NOTE — Assessment & Plan Note (Addendum)
No re occurrence  Pt plans to schedule mammogram (due) Nl exam  Enc regular self breast exams  Planning routine f/u with surgeon after mammogram

## 2019-04-23 NOTE — Assessment & Plan Note (Signed)
Disc goals for lipids and reasons to control them Rev last labs with pt Rev low sat fat diet in detail  LDL is up to 124  Goal is below 130 (pref below 100) Will work on diet  May need statin in the future

## 2019-04-23 NOTE — Assessment & Plan Note (Signed)
Level still low 21.2 Cannot remember to take daily 5000 iu  Plans to start taking with her amlodipine and setting reminder on phone/clock   Disc imp for bone and overall health

## 2019-04-23 NOTE — Assessment & Plan Note (Signed)
Discussed how this problem influences overall health and the risks it imposes  Reviewed plan for weight loss with lower calorie diet (via better food choices and also portion control or program like weight watchers) and exercise building up to or more than 30 minutes 5 days per week including some aerobic activity    

## 2019-04-23 NOTE — Patient Instructions (Addendum)
Call to see if your ins co covers Shingrix vaccine - if it is covered and you want it let us know   Don't forget to schedule your mammogram   To prevent diabetes Try to get most of your carbohydrates from produce (with the exception of white potatoes)  Eat less bread/pasta/rice/snack foods/cereals/sweets and other items from the middle of the grocery store (processed carbs)   Take 5000 iu of vitamin D daily   Try to take care of yourself  Fit in exercise when you can

## 2019-04-23 NOTE — Assessment & Plan Note (Signed)
Pt plans to schedule her own appt- will call if she needs a referral

## 2019-04-23 NOTE — Assessment & Plan Note (Signed)
Lab Results  Component Value Date   HGBA1C 5.7 04/16/2019   disc imp of low glycemic diet and wt loss to prevent DM2

## 2019-04-23 NOTE — Assessment & Plan Note (Addendum)
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

## 2019-04-23 NOTE — Progress Notes (Signed)
Subjective:    Patient ID: Makayla [name], female    DOB: Apr 15, 1961, 58 y.o.   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 Self breast exam -no lumps or changes Chronic pain in L breast where tumor was   Colonoscopy 9/11  Hypertension  bp is stable today  No cp or palpitations or headaches or edema  No side effects to medicines  BP Readings from Last 3 Encounters:  04/23/19 118/74  11/10/18 126/82  10/03/18 126/78     Taking amlodipine   Lab Results  Component Value Date   CREATININE 0.89 04/16/2019   BUN 13 04/16/2019   NA 139 04/16/2019   K 4.1 04/16/2019   CL 103 04/16/2019   CO2 29 04/16/2019   Lab Results  Component Value Date   ALT 15 04/16/2019   AST 12 04/16/2019   ALKPHOS 84 04/16/2019   BILITOT 0.6 04/16/2019    Lab Results  Component Value Date   TSH 2.63 04/16/2019    Lab Results  Component Value Date   WBC 7.1 04/16/2019   HGB 13.9 04/16/2019   HCT 42.0 04/16/2019   MCV 82.4 04/16/2019   PLT 133.0 (L) 04/16/2019   has had slt low plt in the past No bruising or bleeding   Elevated glucose level Lab Results  Component Value Date   HGBA1C 5.7 04/16/2019  was 5.5  Diet is fair  No sweets  Eats whole grain bread with sandwiches  occ chips  Not a lot of potatoes    Hyperlipidemia Lab Results  Component Value Date   CHOL 193 04/16/2019   CHOL 181 01/09/2018   CHOL 194 01/21/2017   Lab Results  Component Value Date   HDL 51.40 04/16/2019   HDL 52.40 01/09/2018   HDL 53.80 01/21/2017   Lab  Results  Component Value Date   LDLCALC 124 (H) 04/16/2019   LDLCALC 111 (H) 01/09/2018   LDLCALC 125 (H) 01/21/2017   Lab Results  Component Value Date   TRIG 86.0 04/16/2019   TRIG 90.0 01/09/2018   TRIG 76.0 01/21/2017   Lab Results  Component Value Date   CHOLHDL 4 04/16/2019   CHOLHDL 3 01/09/2018   CHOLHDL 4 01/21/2017   No results found for: LDLDIRECT Not eating a lot of high fat foods  Did have shellfish at the beach once this year  No red meat  Chips occasionally  occ fried chicken on salad    H/o low vit D This time 21.02 Takes 5000 iu twice weekly   Mood has been fair She has to take care of her mother- very hard esp with long work hours    Patient Active Problem List   Diagnosis Date Noted  . Tachycardia 10/03/2018  . Grief reaction 10/03/2018  . Elevated glucose level 01/16/2018  . Anxious mood 01/23/2017  . Chest pain 11/02/2016  . Left knee pain 09/13/2015  . Vitamin D deficiency 05/20/2015  .  Obesity 05/20/2015  . 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 Screening mammogram, encounter for 10/25/2011  . Routine general medical examination at a health care facility 10/16/2011  . Hyperlipidemia LDL goal <130 12/04/2007  . GERD 12/04/2007  . Essential hypertension 12/04/2007   Past Medical History:  Diagnosis Date  . Bursitis, trochanteric    bi/lat   . Dental crowns present    x 4  . GERD (gastroesophageal reflux disease)   . Hyperlipidemia    no current med.  . Hypertension    white coat syndrome,  . Mass of breast, left 01/2012  . Obesity    Past Surgical History:  Procedure Laterality Date  . ABDOMINAL HYSTERECTOMY     partial  . BREAST EXCISIONAL BIOPSY Left 2013  . BREAST SURGERY  01/10/12   lumpectomy - left  . CHOLECYSTECTOMY N/A 02/10/2016   Procedure: LAPAROSCOPIC CHOLECYSTECTOMY WITH INTRAOPERATIVE CHOLANGIOGRAM;  Surgeon: Autumn Messing III, MD;  Location: [facility];  Service: General;  Laterality: N/A;  .  RECTOCELE REPAIR  06/14/2011   Procedure: POSTERIOR REPAIR (RECTOCELE);  Surgeon: Dutch Gray, MD;  Location: [facility];  Service: Urology;  Laterality: N/A;  cystoscopy, SPARC sling  . ROBOTIC ASSISTED LAPAROSCOPIC SACROCOLPOPEXY  06/14/2011   Procedure: ROBOTIC ASSISTED LAPAROSCOPIC SACROCOLPOPEXY;  Surgeon: Dutch Gray, MD;  Location: [facility];  Service: Urology;  Laterality: N/A;  . TUBAL LIGATION     Social History   Tobacco Use  . Smoking status: Never Smoker  . Smokeless tobacco: Never Used  Substance Use Topics  . Alcohol use: No    Alcohol/week: 0.0 standard drinks  . Drug use: No   Family History  Problem Relation Age of Onset  . Hypertension Mother   . Hyperlipidemia Mother   . Diabetes Mother   . Cancer Mother        uterine  . Breast cancer Mother 58  . GER disease Father   . Depression Sister   . Heart disease Brother   . Heart attack Maternal Grandmother   . Hypertension Maternal Grandmother    No Known Allergies Current Outpatient Medications on File Prior to Visit  Medication Sig Dispense Refill  . esomeprazole (NEXIUM) 20 MG capsule Take 20 mg by mouth daily at 12 noon.    Marland Kitchen ibuprofen (ADVIL,MOTRIN) 800 MG tablet TAKE 1 TABLET BY MOUTH EVERY 8 HOURS AS NEEDED FOR PAIN 90 tablet 1   No current facility-administered medications on file prior to visit.     Review of Systems  Constitutional: Positive for fatigue. Negative for activity change, appetite change, fever and unexpected weight change.       Fatigue from schedule  HENT: Negative for congestion, ear pain, rhinorrhea, sinus pressure and sore throat.   Eyes: Negative for pain, redness and visual disturbance.  Respiratory: Negative for cough, shortness of breath and wheezing.   Cardiovascular: Negative for chest pain and palpitations.  Gastrointestinal: Negative for abdominal pain, blood in stool, constipation and diarrhea.  Endocrine: Negative for polydipsia and polyuria.  Genitourinary: Negative for dysuria,  frequency and urgency.  Musculoskeletal: Negative for arthralgias, back pain and myalgias.  Skin: Negative for pallor and rash.  Allergic/Immunologic: Negative for environmental allergies.  Neurological: Negative for dizziness, syncope and headaches.  Hematological: Negative for adenopathy. Does not bruise/bleed easily.  Psychiatric/Behavioral: Negative for decreased concentration and dysphoric mood. The patient is not nervous/anxious.        Caregiver stress - mother  Also long work hours  Objective:   Physical Exam Constitutional:      General: She is not in acute distress.    Appearance: Normal appearance. She is well-developed. She is obese. She is not ill-appearing or diaphoretic.  HENT:     Head: Normocephalic and atraumatic.     Right Ear: Tympanic membrane, ear canal and external ear normal.     Left Ear: Tympanic membrane, ear canal and external ear normal.     Nose: Nose normal. No congestion.     Mouth/Throat:     Mouth: Mucous membranes are moist.     Pharynx: Oropharynx is clear. No posterior oropharyngeal erythema.  Eyes:     General: No scleral icterus.    Extraocular Movements: Extraocular movements intact.     Conjunctiva/sclera: Conjunctivae normal.     Pupils: Pupils are equal, round, and reactive to light.  Neck:     Musculoskeletal: Normal range of motion and neck supple. No neck rigidity or muscular tenderness.     Thyroid: No thyromegaly.     Vascular: No carotid bruit or JVD.  Cardiovascular:     Rate and Rhythm: Normal rate and regular rhythm.     Pulses: Normal pulses.     Heart sounds: Normal heart sounds. No gallop.   Pulmonary:     Effort: Pulmonary effort is normal. No respiratory distress.     Breath sounds: Normal breath sounds. No wheezing.     Comments: Good air exch Chest:     Chest wall: No tenderness.  Abdominal:     General: Bowel sounds are normal. There is no distension or abdominal bruit.     Palpations: Abdomen is soft.  There is no mass.     Tenderness: There is no abdominal tenderness.     Hernia: No hernia is present.  Genitourinary:    Comments: Breast exam: No mass, nodules, thickening, tenderness, bulging, retraction, inflamation, nipple discharge or skin changes noted.  No axillary or clavicular LA.     Baseline scar on lateral L breast  Musculoskeletal: Normal range of motion.        General: No tenderness.     Right lower leg: No edema.     Left lower leg: No edema.  Lymphadenopathy:     Cervical: No cervical adenopathy.  Skin:    General: Skin is warm and dry.     Coloration: Skin is not pale.     Findings: No erythema or rash.     Comments: Few lentigines  Fair complexion   Neurological:     Mental Status: She is alert. Mental status is at baseline.     Cranial Nerves: No cranial nerve deficit.     Motor: No abnormal muscle tone.     Coordination: Coordination normal.     Gait: Gait normal.     Deep Tendon Reflexes: Reflexes are normal and symmetric. Reflexes normal.  Psychiatric:        Mood and Affect: Mood normal.        Cognition and Memory: Cognition and memory normal.           Assessment & Plan:   Problem List Items Addressed This Visit      Cardiovascular and Mediastinum   Essential hypertension    bp in fair control at this time  BP Readings from Last 1 Encounters:  04/23/19 118/74   No changes needed Most recent labs reviewed  Disc lifstyle change with low sodium diet and exercise  Relevant Medications   amLODipine (NORVASC) 2.5 MG tablet     Other   Hyperlipidemia LDL goal <130    Disc goals for lipids and reasons to control them Rev last labs with pt Rev low sat fat diet in detail  LDL is up to 124  Goal is below 130 (pref below 100) Will work on diet  May need statin in the future       Relevant Medications   amLODipine (NORVASC) 2.5 MG tablet   Routine general medical examination at a health care facility - Primary    Reviewed health  habits including diet and exercise and skin cancer prevention Reviewed appropriate screening tests for age  Also reviewed health mt list, fam hx and immunization status , as well as social and family history   See HPI Labs rev Wt loss enc  Also regular exercise  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)  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-she will check on coverage Disc imp of compliance of vit D intake daily       Screening mammogram, encounter for    Pt plans to schedule her own appt- will call if she needs a referral       History of malignant phylloides tumor of breast    No re occurrence  Pt plans to schedule mammogram (due) Nl exam  Enc regular self breast exams  Planning routine f/u with surgeon after mammogram        Vitamin D deficiency    Level still low 21.2 Cannot remember to take daily 5000 iu  Plans to start taking with her amlodipine and setting reminder on phone/clock   Disc imp for bone and overall health      Obesity    Discussed how this problem influences overall health and the risks it imposes  Reviewed plan for weight loss with lower calorie diet (via better food choices and also portion control or program like weight watchers) and exercise building up to or more than 30 minutes 5 days per week including some aerobic activity         Elevated glucose level    Lab Results  Component Value Date   HGBA1C 5.7 04/16/2019   disc imp of low glycemic diet and wt loss to prevent DM2

## 2019-04-28 ENCOUNTER — Other Ambulatory Visit: Payer: Self-pay

## 2019-04-28 DIAGNOSIS — Z20822 Contact with and (suspected) exposure to covid-19: Secondary | ICD-10-CM

## 2019-04-29 ENCOUNTER — Other Ambulatory Visit: Payer: Self-pay | Admitting: Family Medicine

## 2019-04-29 DIAGNOSIS — Z1231 Encounter for screening mammogram for malignant neoplasm of breast: Secondary | ICD-10-CM

## 2019-04-29 LAB — NOVEL CORONAVIRUS, NAA: SARS-CoV-2, NAA: NOT DETECTED

## 2019-05-14 ENCOUNTER — Telehealth: Payer: Self-pay | Admitting: Family Medicine

## 2019-05-14 NOTE — Telephone Encounter (Signed)
Patient faxed Wellness screening form for Dr.Tower to fill out.  I left a vm for patient to call back.  Form is ready for pick up. Form can't be faxed back to her work due to HIPAA.

## 2019-05-14 NOTE — Telephone Encounter (Signed)
Patient stated she is unable to pick up paperwork due to her work schedule. She stated she will see if her Daughter [name] can come by and pick it up for her

## 2019-05-24 ENCOUNTER — Other Ambulatory Visit: Payer: Self-pay | Admitting: Family Medicine

## 2019-05-25 NOTE — Telephone Encounter (Signed)
CPE on 04/23/19, last filled on 02/07/18 #90 tabs with 1 refill, please advise   [place]

## 2019-06-16 ENCOUNTER — Other Ambulatory Visit: Payer: Self-pay

## 2019-06-16 ENCOUNTER — Ambulatory Visit
Admission: RE | Admit: 2019-06-16 | Discharge: 2019-06-16 | Disposition: A | Discharge status: Home / Self Care | Payer: Managed Care, Other (non HMO) | Source: Ambulatory Visit | Attending: Family Medicine | Admitting: Family Medicine

## 2019-06-16 DIAGNOSIS — Z1231 Encounter for screening mammogram for malignant neoplasm of breast: Secondary | ICD-10-CM

## 2019-09-08 ENCOUNTER — Ambulatory Visit: Payer: Managed Care, Other (non HMO) | Admitting: Family Medicine

## 2019-09-08 ENCOUNTER — Encounter: Payer: Self-pay | Admitting: Family Medicine

## 2019-09-08 ENCOUNTER — Ambulatory Visit (INDEPENDENT_AMBULATORY_CARE_PROVIDER_SITE_OTHER): Payer: Managed Care, Other (non HMO) | Admitting: Family Medicine

## 2019-09-08 ENCOUNTER — Other Ambulatory Visit: Payer: Self-pay

## 2019-09-08 VITALS — BP 155/99 | HR 104 | Temp 97.9°F | Wt 206.5 lb

## 2019-09-08 DIAGNOSIS — T50Z95A Adverse effect of other vaccines and biological substances, initial encounter: Secondary | ICD-10-CM | POA: Diagnosis not present

## 2019-09-08 DIAGNOSIS — I10 Essential (primary) hypertension: Secondary | ICD-10-CM

## 2019-09-08 DIAGNOSIS — F419 Anxiety disorder, unspecified: Secondary | ICD-10-CM

## 2019-09-08 DIAGNOSIS — R000 Tachycardia, unspecified: Secondary | ICD-10-CM | POA: Diagnosis not present

## 2019-09-08 MED ORDER — ALPRAZOLAM 0.5 MG PO TABS: 0.5000 mg | ORAL_TABLET | Freq: Two times a day (BID) | ORAL | 0 refills | Status: DC | PRN

## 2019-09-08 NOTE — Progress Notes (Signed)
Virtual Visit via Video Note  I connected with Makayla [name] on 09/08/19 at 12:00 PM EST by a video enabled telemedicine application and verified that I am speaking with the correct person using two identifiers.  Location: Patient: home Provider: office    I discussed the limitations of evaluation and management by telemedicine and the availability of in person appointments. The patient expressed understanding and agreed to proceed.  Parties involved in encounter  Patient: Makayla [name]  Provider:  Loura Pardon MD    History of Present Illness: Pt presents with symptoms of nausea and inc HR and anxiety since getting her covid vaccines  She had the moderna vaccine  1/29 and then 07/28/20   For the past few weeks has anxious moments   2nd vaccine 1/29 (in the car)  She felt tingly and inc HR and a little itchy in her throat  In obs area - nurse saw her  She went to work - HR in 120s and she was shaky /did not feel right Was able to function at work  Did get a headache that night  (not unexpected)  Then a little better  Today she feels bad again   -BP and HR is up  Feels very anxious   Has had a lot of stress at work  Giving vaccines in the cold   No sob or wheeze No hives or rash  Did get a sore arm -not too bad    She had only a few minutes of rapid HR after first dose   Pulse Readings from Last 3 Encounters:  09/08/19 (!) 104  04/23/19 (!) 58  11/10/18 78   BP Readings from Last 3 Encounters:  09/08/19 (!) 155/99  04/23/19 118/74  11/10/18 126/82   HTN Takes amlodipine 2.5 mg  Has had tachycardia from anxiety in the past   Had a little burning in her chest with nausea -this am  Also on Saturday  No ankle swelling    She worries about a panic attack   Has taken buspar in the past-it made her worse   She has not seen a counselor lately  Has not needed to   Patient Active Problem List   Diagnosis Date Noted  . Immunization reaction 09/08/2019  .  Tachycardia 10/03/2018  . Grief reaction 10/03/2018  . Elevated glucose level 01/16/2018  . Anxious mood 01/23/2017  . Chest pain 11/02/2016  . Left knee pain 09/13/2015  . Vitamin D deficiency 05/20/2015  . Obesity 05/20/2015  . 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 Screening mammogram, encounter for 10/25/2011  . Routine general medical examination at a health care facility 10/16/2011  . Hyperlipidemia LDL goal <130 12/04/2007  . GERD 12/04/2007  . Essential hypertension 12/04/2007   Past Medical History:  Diagnosis Date  . Bursitis, trochanteric    bi/lat   . Dental crowns present    x 4  . GERD (gastroesophageal reflux disease)   . Hyperlipidemia    no current med.  . Hypertension    white coat syndrome,  . Mass of breast, left 01/2012  . Obesity    Past Surgical History:  Procedure Laterality Date  . ABDOMINAL HYSTERECTOMY     partial  . BREAST EXCISIONAL BIOPSY Left 2013  . BREAST SURGERY  01/10/12   lumpectomy - left  . CHOLECYSTECTOMY N/A 02/10/2016   Procedure: LAPAROSCOPIC CHOLECYSTECTOMY WITH INTRAOPERATIVE CHOLANGIOGRAM;  Surgeon: Autumn Messing III, MD;  Location: [facility];  Service:  General;  Laterality: N/A;  . RECTOCELE REPAIR  06/14/2011   Procedure: POSTERIOR REPAIR (RECTOCELE);  Surgeon: Dutch Gray, MD;  Location: [facility];  Service: Urology;  Laterality: N/A;  cystoscopy, SPARC sling  . ROBOTIC ASSISTED LAPAROSCOPIC SACROCOLPOPEXY  06/14/2011   Procedure: ROBOTIC ASSISTED LAPAROSCOPIC SACROCOLPOPEXY;  Surgeon: Dutch Gray, MD;  Location: [facility];  Service: Urology;  Laterality: N/A;  . TUBAL LIGATION     Social History   Tobacco Use  . Smoking status: Never Smoker  . Smokeless tobacco: Never Used  Substance Use Topics  . Alcohol use: No    Alcohol/week: 0.0 standard drinks  . Drug use: No   Family History  Problem Relation Age of Onset  . Hypertension Mother   . Hyperlipidemia Mother   . Diabetes Mother   . Cancer Mother         uterine  . Breast cancer Mother 72  . GER disease Father   . Depression Sister   . Heart disease Brother   . Heart attack Maternal Grandmother   . Hypertension Maternal Grandmother    Allergies  Allergen Reactions  . Buspar [Buspirone]     Made her more anxious   Current Outpatient Medications on File Prior to Visit  Medication Sig Dispense Refill  . amLODipine (NORVASC) 2.5 MG tablet Take 1 tablet (2.5 mg total) by mouth daily. 90 tablet 3  . esomeprazole (NEXIUM) 20 MG capsule Take 20 mg by mouth daily at 12 noon.    Marland Kitchen ibuprofen (ADVIL) 800 MG tablet TAKE 1 TABLET BY MOUTH EVERY 8 HOURS AS NEEDED FOR PAIN 90 tablet 1   No current facility-administered medications on file prior to visit.   Review of Systems  Constitutional: Positive for malaise/fatigue. Negative for chills and fever.  HENT: Negative for congestion, ear pain, sinus pain and sore throat.   Eyes: Negative for blurred vision, discharge and redness.  Respiratory: Negative for cough, shortness of breath, wheezing and stridor.   Cardiovascular: Positive for palpitations. Negative for chest pain, leg swelling and PND.  Gastrointestinal: Positive for nausea. Negative for abdominal pain, diarrhea and vomiting.  Musculoskeletal: Negative for myalgias.  Skin: Negative for rash.  Neurological: Negative for dizziness and headaches.  Psychiatric/Behavioral: Negative for depression. The patient is nervous/anxious and has insomnia.     Observations/Objective: Patient appears well, in no distress Weight is baseline  No facial swelling or asymmetry Normal voice-not hoarse and no slurred speech No obvious tremor or mobility impairment Moving neck and UEs normally Able to hear the call well  No cough or shortness of breath during interview  Talkative and mentally sharp with no cognitive changes (good historian) No skin changes on face or neck , no rash or pallor Affect is mildly anxious    Assessment and Plan: Problem List  Items Addressed This Visit      Cardiovascular and Mediastinum   Essential hypertension    BP is up in the setting of anxiety  Will tx with xanax for 1-2 days to calm -see how bp does  inst to update if no imp in bp and pulse         Other   Anxious mood - Primary    More free floating anxiety lately with stressors (work, Therapist, occupational) , and now more since her 2nd covid vaccine Suspect bp and pulse inc are due to this anxiety  Disc counseling if needed and good self care  Px xanax given to use for 1-2 d acutely to stop  anx cycle (after this can disc more long term tx if needed) Of note-buspar did not help in the past She will take day off of work today and try xanax with caution of sedation  inst to check back re: how she feels and vital signs      Relevant Medications   ALPRAZolam (XANAX) 0.5 MG tablet   Tachycardia    Suspect again due to anxiety  Pt will monitor after tx with xanax short term inst to call if no improvement when feeling calm  Has had neg cardiac w/u in the past for this      Immunization reaction    moderna covid vaccine  Anxiety is main rxn   (brief feeling of throat itching lasted minutes)  Now bp and pulse elevated for several days (suspect from anxiety)  Talked through this  Will try claritin and acetaminophen as needed  Short trial of xanax for anxiety and pt will update           Follow Up Instructions: Try claritin 10 mg daily and acetaminophen as needed for immunization reaction if any   Try the xanax short term for anxiety with caution of sedation  Let us know in several days if you do not feel better and if BP and pulse do not improve with improved anxiety   Take care of yourself  Get some rest today   We can talk about more long term treatment for anxiety later if needed    I discussed the assessment and treatment plan with the patient. The patient was provided an opportunity to ask questions and all were answered. The patient agreed with  the plan and demonstrated an understanding of the instructions.   The patient was advised to call back or seek an in-person evaluation if the symptoms worsen or if the condition fails to improve as anticipated.   Makayla Pardon, MD

## 2019-09-08 NOTE — Assessment & Plan Note (Signed)
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

## 2019-09-08 NOTE — Patient Instructions (Signed)
Try claritin 10 mg daily and acetaminophen as needed for immunization reaction if any   Try the xanax short term for anxiety with caution of sedation  Let us know in several days if you do not feel better and if BP and pulse do not improve with improved anxiety   Take care of yourself  Get some rest today   We can talk about more long term treatment for anxiety later if needed

## 2019-09-08 NOTE — Assessment & Plan Note (Signed)
moderna covid vaccine  Anxiety is main rxn   (brief feeling of throat itching lasted minutes)  Now bp and pulse elevated for several days (suspect from anxiety)  Talked through this  Will try claritin and acetaminophen as needed  Short trial of xanax for anxiety and pt will update

## 2019-09-08 NOTE — Assessment & Plan Note (Signed)
BP is up in the setting of anxiety  Will tx with xanax for 1-2 days to calm -see how bp does  inst to update if no imp in bp and pulse

## 2019-09-08 NOTE — Assessment & Plan Note (Signed)
Suspect again due to anxiety  Pt will monitor after tx with xanax short term inst to call if no improvement when feeling calm  Has had neg cardiac w/u in the past for this

## 2019-09-15 ENCOUNTER — Encounter: Payer: Self-pay | Admitting: Family Medicine

## 2019-09-16 ENCOUNTER — Encounter: Payer: Self-pay | Admitting: Family Medicine

## 2019-09-16 MED ORDER — PAROXETINE HCL 10 MG PO TABS: 10.0000 mg | ORAL_TABLET | Freq: Every day | ORAL | 5 refills | Status: DC

## 2019-09-16 MED ORDER — HYDROXYZINE HCL 10 MG PO TABS: 10.0000 mg | ORAL_TABLET | Freq: Three times a day (TID) | ORAL | 1 refills | Status: DC | PRN

## 2019-09-16 NOTE — Telephone Encounter (Signed)
I sent medications to your [facility] Watch out for sedation with hydroxyzine (vistaril)   If any worse or if intolerable side effects please alert me  Please message me next week to let me know how you are doing

## 2019-09-27 ENCOUNTER — Encounter: Payer: Self-pay | Admitting: Family Medicine

## 2019-09-30 ENCOUNTER — Ambulatory Visit: Payer: Managed Care, Other (non HMO) | Admitting: Family Medicine

## 2019-09-30 ENCOUNTER — Other Ambulatory Visit: Payer: Self-pay

## 2019-09-30 ENCOUNTER — Encounter: Payer: Self-pay | Admitting: Family Medicine

## 2019-09-30 VITALS — BP 114/78 | HR 77 | Temp 97.2°F | Ht 64.0 in | Wt 202.0 lb

## 2019-09-30 DIAGNOSIS — E6609 Other obesity due to excess calories: Secondary | ICD-10-CM

## 2019-09-30 DIAGNOSIS — I10 Essential (primary) hypertension: Secondary | ICD-10-CM | POA: Diagnosis not present

## 2019-09-30 DIAGNOSIS — F419 Anxiety disorder, unspecified: Secondary | ICD-10-CM | POA: Diagnosis not present

## 2019-09-30 DIAGNOSIS — H93A1 Pulsatile tinnitus, right ear: Secondary | ICD-10-CM | POA: Insufficient documentation

## 2019-09-30 DIAGNOSIS — Z6834 Body mass index (BMI) 34.0-34.9, adult: Secondary | ICD-10-CM

## 2019-09-30 MED ORDER — FLUTICASONE PROPIONATE 50 MCG/ACT NA SUSP: 2.0000 | Freq: Every day | NASAL | 6 refills | Status: DC

## 2019-09-30 NOTE — Assessment & Plan Note (Signed)
Pt recently decided to try the paxil- will update with progress May try hydroxyzine for sleep Offered counseling ref at any time  Reviewed stressors/ coping techniques/symptoms/ support sources/ tx options and side effects in detail today

## 2019-09-30 NOTE — Assessment & Plan Note (Signed)
Discussed how this problem influences overall health and the risks it imposes  Reviewed plan for weight loss with lower calorie diet (via better food choices and also portion control or program like weight watchers) and exercise building up to or more than 30 minutes 5 days per week including some aerobic activity    

## 2019-09-30 NOTE — Assessment & Plan Note (Signed)
Nl exam  C/o pressure and discomfort in ear as well No carotid bruits  No facial tenderness or rash Disc poss of ETD  Will try fluticasone ns and update  If no imp consider ENT referral /audiology eval

## 2019-09-30 NOTE — Progress Notes (Signed)
Subjective:    Patient ID: Makayla [name], female    DOB: 1961-05-27, 59 y.o.   MRN: [id number] This visit occurred during the SARS-CoV-2 public health emergency.  Safety protocols were in place, including screening questions prior to the visit, additional usage of staff PPE, and extensive cleaning of exam room while observing appropriate contact time as indicated for disinfecting solutions.    HPI Pt presents with ear pressure and pulsation in her ears   Wt Readings from Last 3 Encounters:  09/30/19 202 lb (91.6 kg)  09/08/19 206 lb 8 oz (93.7 kg)  04/23/19 204 lb 9 oz (92.8 kg)   34.67 kg/m   Right ear  Almost a week ago- she came home from work late  Noticed her "heartbeat" in her ear  Only notices if it is quiet  Over the weekend she felt pressure in her ear   No h/o hearing loss  No h/o cerumen impaction  No h/o tinnitis  No respiratory symptoms except chronic runny nose from allergies (takes claritin on and off) - started that on Monday   No notable loud noise exposure     bp is stable today  No cp or palpitations or headaches or edema  No side effects to medicines  BP Readings from Last 3 Encounters:  09/30/19 114/78  09/08/19 (!) 155/99  04/23/19 118/74     Pulse Readings from Last 3 Encounters:  09/30/19 77  09/08/19 (!) 104  04/23/19 (!) 58   Anxiety is a lot better overall  She held off on starting paxil until Monday night  No side effects or issues   Has not tried hydroxyzine for sleep  She wakes up many times and cannot stay asleep Stress is still high   She has taken 1/2 xanax three times- it helps   Stressors are still very high   PHQ-9 score is 4 today   Patient Active Problem List   Diagnosis Date Noted  . Pulsatile tinnitus of right ear 09/30/2019  . Immunization reaction 09/08/2019  . Tachycardia 10/03/2018  . Grief reaction 10/03/2018  . Elevated glucose level 01/16/2018  . Anxious mood 01/23/2017  . Chest pain 11/02/2016  .  Left knee pain 09/13/2015  . Vitamin D deficiency 05/20/2015  . Obesity 05/20/2015  . 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 Screening mammogram, encounter for 10/25/2011  . Routine general medical examination at a health care facility 10/16/2011  . Hyperlipidemia LDL goal <130 12/04/2007  . GERD 12/04/2007  . Essential hypertension 12/04/2007   Past Medical History:  Diagnosis Date  . Bursitis, trochanteric    bi/lat   . Dental crowns present    x 4  . GERD (gastroesophageal reflux disease)   . Hyperlipidemia    no current med.  . Hypertension    white coat syndrome,  . Mass of breast, left 01/2012  . Obesity    Past Surgical History:  Procedure Laterality Date  . ABDOMINAL HYSTERECTOMY     partial  . BREAST EXCISIONAL BIOPSY Left 2013  . BREAST SURGERY  01/10/12   lumpectomy - left  . CHOLECYSTECTOMY N/A 02/10/2016   Procedure: LAPAROSCOPIC CHOLECYSTECTOMY WITH INTRAOPERATIVE CHOLANGIOGRAM;  Surgeon: Autumn Messing III, MD;  Location: [facility];  Service: General;  Laterality: N/A;  . RECTOCELE REPAIR  06/14/2011   Procedure: POSTERIOR REPAIR (RECTOCELE);  Surgeon: Dutch Gray, MD;  Location: [facility];  Service: Urology;  Laterality: N/A;  cystoscopy, SPARC sling  . ROBOTIC ASSISTED  LAPAROSCOPIC SACROCOLPOPEXY  06/14/2011   Procedure: ROBOTIC ASSISTED LAPAROSCOPIC SACROCOLPOPEXY;  Surgeon: Dutch Gray, MD;  Location: [facility];  Service: Urology;  Laterality: N/A;  . TUBAL LIGATION     Social History   Tobacco Use  . Smoking status: Never Smoker  . Smokeless tobacco: Never Used  Substance Use Topics  . Alcohol use: No    Alcohol/week: 0.0 standard drinks  . Drug use: No   Family History  Problem Relation Age of Onset  . Hypertension Mother   . Hyperlipidemia Mother   . Diabetes Mother   . Cancer Mother        uterine  . Breast cancer Mother 58  . GER disease Father   . Depression Sister   . Heart disease Brother   . Heart attack Maternal Grandmother   .  Hypertension Maternal Grandmother    Allergies  Allergen Reactions  . Buspar [Buspirone]     Made her more anxious   Current Outpatient Medications on File Prior to Visit  Medication Sig Dispense Refill  . ALPRAZolam (XANAX) 0.5 MG tablet Take 1 tablet (0.5 mg total) by mouth 2 (two) times daily as needed for anxiety. Caution of sedation 15 tablet 0  . amLODipine (NORVASC) 2.5 MG tablet Take 1 tablet (2.5 mg total) by mouth daily. 90 tablet 3  . esomeprazole (NEXIUM) 20 MG capsule Take 20 mg by mouth daily at 12 noon.    . hydrOXYzine (ATARAX/VISTARIL) 10 MG tablet Take 1 tablet (10 mg total) by mouth 3 (three) times daily as needed (and to help sleep). Caution of sedation 30 tablet 1  . ibuprofen (ADVIL) 800 MG tablet TAKE 1 TABLET BY MOUTH EVERY 8 HOURS AS NEEDED FOR PAIN 90 tablet 1  . PARoxetine (PAXIL) 10 MG tablet Take 1 tablet (10 mg total) by mouth daily. 30 tablet 5   No current facility-administered medications on file prior to visit.    Review of Systems  Constitutional: Negative for activity change, appetite change, fatigue, fever and unexpected weight change.  HENT: Positive for ear pain. Negative for congestion, ear discharge, facial swelling, hearing loss, postnasal drip, rhinorrhea, sinus pressure, sore throat and trouble swallowing.   Eyes: Negative for pain, redness and visual disturbance.  Respiratory: Negative for cough, shortness of breath and wheezing.   Cardiovascular: Negative for chest pain and palpitations.  Gastrointestinal: Negative for abdominal pain, blood in stool, constipation and diarrhea.  Endocrine: Negative for polydipsia and polyuria.  Genitourinary: Negative for dysuria, frequency and urgency.  Musculoskeletal: Negative for arthralgias, back pain and myalgias.  Skin: Negative for pallor and rash.  Allergic/Immunologic: Negative for environmental allergies.  Neurological: Negative for dizziness, syncope and headaches.  Hematological: Negative for  adenopathy. Does not bruise/bleed easily.  Psychiatric/Behavioral: Positive for sleep disturbance. Negative for decreased concentration and dysphoric mood. The patient is nervous/anxious.        Objective:   Physical Exam Constitutional:      General: She is not in acute distress.    Appearance: Normal appearance. She is well-developed. She is obese. She is not ill-appearing or diaphoretic.  HENT:     Head: Normocephalic and atraumatic.     Comments: No temporal or sinus tenderness    Right Ear: Tympanic membrane, ear canal and external ear normal. There is no impacted cerumen.     Left Ear: Tympanic membrane, ear canal and external ear normal. There is no impacted cerumen.     Ears:     Comments: TMs are slightly  retracted    Nose: No rhinorrhea.     Mouth/Throat:     Mouth: Mucous membranes are moist.     Pharynx: Oropharynx is clear. No posterior oropharyngeal erythema.  Eyes:     General: No scleral icterus.       Right eye: No discharge.        Left eye: No discharge.     Conjunctiva/sclera: Conjunctivae normal.     Pupils: Pupils are equal, round, and reactive to light.  Neck:     Thyroid: No thyromegaly.     Vascular: No carotid bruit or JVD.  Cardiovascular:     Rate and Rhythm: Normal rate and regular rhythm.     Heart sounds: Normal heart sounds. No gallop.   Pulmonary:     Effort: Pulmonary effort is normal. No respiratory distress.     Breath sounds: Normal breath sounds. No wheezing, rhonchi or rales.  Abdominal:     General: Bowel sounds are normal. There is no distension or abdominal bruit.     Palpations: Abdomen is soft. There is no mass.     Tenderness: There is no abdominal tenderness.  Musculoskeletal:     Cervical back: Normal range of motion and neck supple. No rigidity or tenderness.  Lymphadenopathy:     Cervical: No cervical adenopathy.  Skin:    General: Skin is warm and dry.     Coloration: Skin is not pale.     Findings: No erythema or rash.    Neurological:     Mental Status: She is alert.     Cranial Nerves: No cranial nerve deficit.     Sensory: No sensory deficit.     Motor: No weakness.     Coordination: Coordination normal.     Deep Tendon Reflexes: Reflexes are normal and symmetric. Reflexes normal.  Psychiatric:        Mood and Affect: Mood is anxious.        Cognition and Memory: Cognition and memory normal.     Comments: Mood is fairly good today (mildly anxious) Discusses stressors candidly Very pleasant           Assessment & Plan:   Problem List Items Addressed This Visit      Cardiovascular and Mediastinum   Essential hypertension    bp in fair control at this time  BP Readings from Last 1 Encounters:  09/30/19 114/78   No changes needed Most recent labs reviewed  Disc lifstyle change with low sodium diet and exercise          Nervous and Auditory   Pulsatile tinnitus of right ear - Primary    Nl exam  C/o pressure and discomfort in ear as well No carotid bruits  No facial tenderness or rash Disc poss of ETD  Will try fluticasone ns and update  If no imp consider ENT referral /audiology eval         Other   Obesity    Discussed how this problem influences overall health and the risks it imposes  Reviewed plan for weight loss with lower calorie diet (via better food choices and also portion control or program like weight watchers) and exercise building up to or more than 30 minutes 5 days per week including some aerobic activity         Anxious mood    Pt recently decided to try the paxil- will update with progress May try hydroxyzine for sleep Offered counseling ref at any time  Reviewed stressors/ coping techniques/symptoms/ support sources/ tx options and side effects in detail today

## 2019-09-30 NOTE — Assessment & Plan Note (Signed)
bp in fair control at this time  BP Readings from Last 1 Encounters:  09/30/19 114/78   No changes needed Most recent labs reviewed  Disc lifstyle change with low sodium diet and exercise

## 2019-09-30 NOTE — Patient Instructions (Addendum)
You may have some eustacian tube dysfunction in the ear Continue antihistamine  Add fluticasone nasal spray daily   If no improvement please let me know  I would consider an ENT referral   Take care of yourself  Continue the paxil and let me know if things do not start to improve Hydroxyzine for sleep is ok to start   If you become interested in counseling  Meditation is helpful also

## 2019-12-30 ENCOUNTER — Encounter: Payer: Self-pay | Admitting: Family Medicine

## 2019-12-31 ENCOUNTER — Encounter: Payer: Self-pay | Admitting: Family Medicine

## 2020-01-05 ENCOUNTER — Encounter: Payer: Self-pay | Admitting: Family Medicine

## 2020-01-08 ENCOUNTER — Ambulatory Visit: Payer: Managed Care, Other (non HMO) | Admitting: Family Medicine

## 2020-01-08 ENCOUNTER — Encounter: Payer: Self-pay | Admitting: Family Medicine

## 2020-01-08 VITALS — BP 138/88 | HR 120 | Temp 96.9°F | Ht 64.0 in | Wt 202.1 lb

## 2020-01-08 DIAGNOSIS — F419 Anxiety disorder, unspecified: Secondary | ICD-10-CM

## 2020-01-08 DIAGNOSIS — I10 Essential (primary) hypertension: Secondary | ICD-10-CM | POA: Diagnosis not present

## 2020-01-08 DIAGNOSIS — Z6834 Body mass index (BMI) 34.0-34.9, adult: Secondary | ICD-10-CM

## 2020-01-08 DIAGNOSIS — R202 Paresthesia of skin: Secondary | ICD-10-CM

## 2020-01-08 DIAGNOSIS — R000 Tachycardia, unspecified: Secondary | ICD-10-CM | POA: Diagnosis not present

## 2020-01-08 DIAGNOSIS — E6609 Other obesity due to excess calories: Secondary | ICD-10-CM

## 2020-01-08 DIAGNOSIS — L299 Pruritus, unspecified: Secondary | ICD-10-CM | POA: Insufficient documentation

## 2020-01-08 MED ORDER — PAROXETINE HCL 20 MG PO TABS: 20.0000 mg | ORAL_TABLET | Freq: Every day | ORAL | 3 refills | Status: DC

## 2020-01-08 NOTE — Patient Instructions (Addendum)
Take care of yourself  Try to get back to walking   Increase paxil to 20 mg daily   (we can consider celexa or lexapro in the future if you want to)   Take the hydroxyzine at bedtime for the itching and burning- let me know if it does not help It also helps anxiety and sleep   Keep talking to friends and family   I will refer you for counseling -the office will call you
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
## 2020-01-10 NOTE — Assessment & Plan Note (Signed)
Improved on 2nd check BP: 138/88

## 2020-01-10 NOTE — Assessment & Plan Note (Signed)
This tends to occur with anxiety  Inst pt to check at home when more relaxed and update [place] if no improvement

## 2020-01-10 NOTE — Assessment & Plan Note (Signed)
Discussed how this problem influences overall health and the risks it imposes  Reviewed plan for weight loss with lower calorie diet (via better food choices and also portion control or program like weight watchers) and exercise building up to or more than 30 minutes 5 days per week including some aerobic activity    

## 2020-01-10 NOTE — Assessment & Plan Note (Signed)
Disc poss of allergy /or anxiety trigger  Will watch for triggering exposures Plan to inc paxil for anxiety today and ref for counseling  Also enc her to try hydroxyzine at night (caution of sedation) Watch for rash or any s/s of angioedema

## 2020-01-10 NOTE — Assessment & Plan Note (Signed)
Tingling on L leg -left lateral w/o back or groin pain or skin change  Unsure if related to all over itching Disc poss of parestheica meralgia  Also may be anx related Nl exam today  Will obs

## 2020-01-10 NOTE — Assessment & Plan Note (Signed)
Worse again lately  Disc opt  Will inc paxil to 20 mg daily  (could consider celexa or lexapro in the past)  Hydroxyzine in pm prn  Reviewed stressors/ coping techniques/symptoms/ support sources/ tx options and side effects in detail today Enc self care  Ref made for counseling

## 2020-01-15 ENCOUNTER — Encounter: Payer: Self-pay | Admitting: Family Medicine

## 2020-02-23 ENCOUNTER — Encounter: Payer: Self-pay | Admitting: Family Medicine

## 2020-02-24 ENCOUNTER — Telehealth: Payer: Self-pay

## 2020-02-24 NOTE — Telephone Encounter (Signed)
Aware, I will see her then  

## 2020-02-24 NOTE — Telephone Encounter (Signed)
Pt left pt message on 02/23/20 with same symptoms calling about today.  Hi Dr Glori Bickers. I have been experiencing for the past week sharp pain in the left temple area of my head as well as pain behind my left eye. I feel like my neck is stiff at times but isn't difficult to move. I am leaving to go out of town for a few days on Friday but wasn't sure if I needed to see you about this or not. I feel like these are all neurological symptoms and I just haven't felt good today. That's why I am sending this message. Thank you so much.   I spoke with pt and the head and eye pain are intermittent for about 1 wk and only last for few seconds and pain level at highest has been 2. No vision changes except pt has occasional floaters; pt saw eye dr last year about floaters on and off. Pt does have neck stiffness but pt can touch chin on chest. Pt has lightheadedness on and off but none now. No CP or SOB. Pt has already scheduled in office appt with Dr Glori Bickers on 02/25/20 at 8:30. UC & ED precautions given and pt voiced understanding. FYI to Dr Glori Bickers.

## 2020-02-25 ENCOUNTER — Ambulatory Visit: Payer: Managed Care, Other (non HMO) | Admitting: Family Medicine

## 2020-02-25 ENCOUNTER — Encounter: Payer: Self-pay | Admitting: Family Medicine

## 2020-02-25 ENCOUNTER — Other Ambulatory Visit: Payer: Self-pay

## 2020-02-25 VITALS — BP 126/74 | HR 82 | Temp 96.9°F | Ht 64.0 in | Wt 205.2 lb

## 2020-02-25 DIAGNOSIS — G44219 Episodic tension-type headache, not intractable: Secondary | ICD-10-CM

## 2020-02-25 DIAGNOSIS — G4489 Other headache syndrome: Secondary | ICD-10-CM

## 2020-02-25 DIAGNOSIS — F419 Anxiety disorder, unspecified: Secondary | ICD-10-CM

## 2020-02-25 DIAGNOSIS — R519 Headache, unspecified: Secondary | ICD-10-CM | POA: Insufficient documentation

## 2020-02-25 DIAGNOSIS — I10 Essential (primary) hypertension: Secondary | ICD-10-CM | POA: Diagnosis not present

## 2020-02-25 LAB — SEDIMENTATION RATE: Sed Rate: 13 mm/hr (ref 0–30)

## 2020-02-25 LAB — C-REACTIVE PROTEIN: CRP: <1 mg/dL (ref 0.5–20.0)

## 2020-02-25 MED ORDER — METHOCARBAMOL 500 MG PO TABS: 500.0000 mg | ORAL_TABLET | Freq: Three times a day (TID) | ORAL | 1 refills | Status: DC | PRN

## 2020-02-25 NOTE — Patient Instructions (Signed)
For your frontal headache (may be tension/stress related), drink more fluids/ avoid excessive caffeine and take care of yourself  Ice to painful area of head and gentle heat to neck may help Try the robaxin (muscle relaxer) for headache if needed (caution of sedation)   If no improvement in 1-2 weeks or if worse-let me know and we may consider more testing/imaging    For the shooting pain in your temple - I want to check a sed rate and crp to rule out temporal arteritis

## 2020-02-25 NOTE — Assessment & Plan Note (Signed)
Fleeting sharp pain in L temple No tenderness or vision change  Also having some tension type headaches   Esr and crp drawn to r/o temporal arteritis

## 2020-02-25 NOTE — Progress Notes (Signed)
Subjective:    Patient ID: Makayla [name], female    DOB: 09/30/1960, 59 y.o.   MRN: [id number]  This visit occurred during the SARS-CoV-2 public health emergency.  Safety protocols were in place, including screening questions prior to the visit, additional usage of staff PPE, and extensive cleaning of exam room while observing appropriate contact time as indicated for disinfecting solutions.    HPI Pt presents with L sided head pain/eye pain and neck stiffness  Wt Readings from Last 3 Encounters:  02/25/20 205 lb 4 oz (93.1 kg)  01/08/20 202 lb 2 oz (91.7 kg)  09/30/19 202 lb (91.6 kg)   35.23 kg/m  Headaches come and go- usually takes tylenol  About a week ago - noticed a shooting pain in L temple  Caused eye to hurt  Pain is sharp and fleeting (for a second) 3-4 times in a day  Notices more when quiet/reading   They do not wake her up   One day she had a headache all night  Headaches are frontal - both sides  Not throbbing  Constant and dull  Very mild   No head congestion  Is often tearful  Has struggled with anxiety- and has not tolerated medicines so far  Stress level is very high  Trying to figure out how to care for her mother- working on her medicaid application  Work is stressful with covid   She started her retirement process - retiring on nov 1   No vision changes  No light or sound sensitivity  A little nausea- comes and goes   She has never had headache problems  No h/o migraines   ? If tension  Neck is tense feeling- posterior and both sides  Worse in afternoon - when she gets tired   At work -had noticed some pain in both eyes - wondered about eye strain and glasses   HTN bp is stable today  No cp or palpitations or headaches or edema  No side effects to medicines  BP Readings from Last 3 Encounters:  02/25/20 126/74  01/08/20 138/88  09/30/19 114/78      Pulse Readings from Last 3 Encounters:  02/25/20 82  01/08/20 (!) 120  09/30/19  77    Patient Active Problem List   Diagnosis Date Noted  . Headache 02/25/2020  . Pain head 02/25/2020  . Tingling 01/08/2020  . Itching 01/08/2020  . Pulsatile tinnitus of right ear 09/30/2019  . Immunization reaction 09/08/2019  . Tachycardia 10/03/2018  . Grief reaction 10/03/2018  . Elevated glucose level 01/16/2018  . Anxious mood 01/23/2017  . Chest pain 11/02/2016  . Left knee pain 09/13/2015  . Vitamin D deficiency 05/20/2015  . Obesity 05/20/2015  . 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 Screening mammogram, encounter for 10/25/2011  . Routine general medical examination at a health care facility 10/16/2011  . Hyperlipidemia LDL goal <130 12/04/2007  . GERD 12/04/2007  . Essential hypertension 12/04/2007   Past Medical History:  Diagnosis Date  . Bursitis, trochanteric    bi/lat   . Dental crowns present    x 4  . GERD (gastroesophageal reflux disease)   . Hyperlipidemia    no current med.  . Hypertension    white coat syndrome,  . Mass of breast, left 01/2012  . Obesity    Past Surgical History:  Procedure Laterality Date  . ABDOMINAL HYSTERECTOMY     partial  . BREAST EXCISIONAL BIOPSY  Left 2013  . BREAST SURGERY  01/10/12   lumpectomy - left  . CHOLECYSTECTOMY N/A 02/10/2016   Procedure: LAPAROSCOPIC CHOLECYSTECTOMY WITH INTRAOPERATIVE CHOLANGIOGRAM;  Surgeon: Autumn Messing III, MD;  Location: [facility];  Service: General;  Laterality: N/A;  . RECTOCELE REPAIR  06/14/2011   Procedure: POSTERIOR REPAIR (RECTOCELE);  Surgeon: Dutch Gray, MD;  Location: [facility];  Service: Urology;  Laterality: N/A;  cystoscopy, SPARC sling  . ROBOTIC ASSISTED LAPAROSCOPIC SACROCOLPOPEXY  06/14/2011   Procedure: ROBOTIC ASSISTED LAPAROSCOPIC SACROCOLPOPEXY;  Surgeon: Dutch Gray, MD;  Location: [facility];  Service: Urology;  Laterality: N/A;  . TUBAL LIGATION     Social History   Tobacco Use  . Smoking status: Never Smoker  . Smokeless tobacco: Never Used    Substance Use Topics  . Alcohol use: No    Alcohol/week: 0.0 standard drinks  . Drug use: No   Family History  Problem Relation Age of Onset  . Hypertension Mother   . Hyperlipidemia Mother   . Diabetes Mother   . Cancer Mother        uterine  . Breast cancer Mother 63  . GER disease Father   . Depression Sister   . Heart disease Brother   . Heart attack Maternal Grandmother   . Hypertension Maternal Grandmother    Allergies  Allergen Reactions  . Buspar [Buspirone]     Made her more anxious   Current Outpatient Medications on File Prior to Visit  Medication Sig Dispense Refill  . amLODipine (NORVASC) 2.5 MG tablet Take 1 tablet (2.5 mg total) by mouth daily. 90 tablet 3  . esomeprazole (NEXIUM) 20 MG capsule Take 20 mg by mouth daily at 12 noon.    . fluticasone (FLONASE) 50 MCG/ACT nasal spray Place 2 sprays into both nostrils daily. 16 g 6  . hydrOXYzine (ATARAX/VISTARIL) 10 MG tablet Take 1 tablet (10 mg total) by mouth 3 (three) times daily as needed (and to help sleep). Caution of sedation 30 tablet 1  . ibuprofen (ADVIL) 800 MG tablet TAKE 1 TABLET BY MOUTH EVERY 8 HOURS AS NEEDED FOR PAIN 90 tablet 1  . Vitamin D, Cholecalciferol, 25 MCG (1000 UT) TABS Take 5,000 Units by mouth daily.    Marland Kitchen ALPRAZolam (XANAX) 0.5 MG tablet Take 1 tablet (0.5 mg total) by mouth 2 (two) times daily as needed for anxiety. Caution of sedation (Patient not taking: Reported on 01/08/2020) 15 tablet 0   No current facility-administered medications on file prior to visit.    Review of Systems  Constitutional: Positive for fatigue. Negative for activity change, appetite change, fever and unexpected weight change.  HENT: Negative for congestion, ear pain, rhinorrhea, sinus pressure and sore throat.   Eyes: Negative for pain, redness and visual disturbance.  Respiratory: Negative for cough, shortness of breath and wheezing.   Cardiovascular: Negative for chest pain and palpitations.   Gastrointestinal: Negative for abdominal pain, blood in stool, constipation and diarrhea.  Endocrine: Negative for polydipsia and polyuria.  Genitourinary: Negative for dysuria, frequency and urgency.  Musculoskeletal: Negative for arthralgias, back pain and myalgias.  Skin: Negative for pallor and rash.  Allergic/Immunologic: Negative for environmental allergies.  Neurological: Positive for headaches. Negative for dizziness, tremors, syncope, facial asymmetry, speech difficulty, weakness, light-headedness and numbness.  Hematological: Negative for adenopathy. Does not bruise/bleed easily.  Psychiatric/Behavioral: Negative for decreased concentration and dysphoric mood. The patient is nervous/anxious.        Objective:   Physical Exam Constitutional:  General: She is not in acute distress.    Appearance: Normal appearance. She is well-developed. She is obese. She is not ill-appearing.  HENT:     Head: Normocephalic and atraumatic.     Comments: No sinus or temporal tenderness    Right Ear: External ear normal.     Left Ear: External ear normal.     Nose: Nose normal. No congestion.     Mouth/Throat:     Mouth: Mucous membranes are moist.     Pharynx: No oropharyngeal exudate.  Eyes:     General: No scleral icterus.       Right eye: No discharge.        Left eye: No discharge.     Conjunctiva/sclera: Conjunctivae normal.     Pupils: Pupils are equal, round, and reactive to light.     Comments: No nystagmus  Neck:     Thyroid: No thyromegaly.     Vascular: No carotid bruit or JVD.     Trachea: No tracheal deviation.  Cardiovascular:     Rate and Rhythm: Normal rate and regular rhythm.     Heart sounds: Normal heart sounds. No murmur heard.   Pulmonary:     Effort: Pulmonary effort is normal. No respiratory distress.     Breath sounds: Normal breath sounds. No wheezing or rales.  Abdominal:     General: Bowel sounds are normal. There is no distension.     Palpations:  Abdomen is soft. There is no mass.     Tenderness: There is no abdominal tenderness.  Musculoskeletal:        General: No tenderness.     Cervical back: Full passive range of motion without pain, normal range of motion and neck supple.  Lymphadenopathy:     Cervical: No cervical adenopathy.  Skin:    General: Skin is warm and dry.     Coloration: Skin is not pale.     Findings: No rash.  Neurological:     Mental Status: She is alert and oriented to person, place, and time.     Cranial Nerves: No cranial nerve deficit or facial asymmetry.     Sensory: Sensation is intact. No sensory deficit.     Motor: No weakness, tremor, atrophy or abnormal muscle tone.     Coordination: Romberg sign negative. Coordination normal. Finger-Nose-Finger Test normal.     Gait: Gait normal.     Deep Tendon Reflexes: Reflexes are normal and symmetric. Reflexes normal.     Comments: No focal cerebellar signs    Psychiatric:        Mood and Affect: Mood is anxious.        Behavior: Behavior normal.        Thought Content: Thought content normal.        Cognition and Memory: Cognition normal.     Comments: Mildly anxious  Not tearful            Assessment & Plan:   Problem List Items Addressed This Visit      Cardiovascular and Mediastinum   Essential hypertension    bp in fair control at this time  BP Readings from Last 1 Encounters:  02/25/20 126/74   No changes needed Most recent labs reviewed  Disc lifstyle change with low sodium diet and exercise          Other   Anxious mood    Pt did not tolerate paxil or other interventions so far  Stress is high  Planning to retire-this should be helpful       Headache - Primary    Bilateral/frontal - with neck tension/ suspect tension type headache Disc habits for headache prevention  Esr and crp ordered to r/o TA  Px methocarbamol for use prn  Disc use of heat/ice and good hydration  Consider imaging if no improvement       Relevant Medications   methocarbamol (ROBAXIN) 500 MG tablet   Other Relevant Orders   Sedimentation Rate (Completed)   C-reactive protein (Completed)   Pain head    Fleeting sharp pain in L temple No tenderness or vision change  Also having some tension type headaches   Esr and crp drawn to r/o temporal arteritis      Relevant Medications   methocarbamol (ROBAXIN) 500 MG tablet   Other Relevant Orders   Sedimentation Rate (Completed)   C-reactive protein (Completed)

## 2020-02-25 NOTE — Assessment & Plan Note (Signed)
Pt did not tolerate paxil or other interventions so far  Stress is high  Planning to retire-this should be helpful

## 2020-02-25 NOTE — Assessment & Plan Note (Signed)
Bilateral/frontal - with neck tension/ suspect tension type headache Disc habits for headache prevention  Esr and crp ordered to r/o TA  Px methocarbamol for use prn  Disc use of heat/ice and good hydration  Consider imaging if no improvement

## 2020-02-25 NOTE — Assessment & Plan Note (Signed)
bp in fair control at this time  BP Readings from Last 1 Encounters:  02/25/20 126/74   No changes needed Most recent labs reviewed  Disc lifstyle change with low sodium diet and exercise

## 2020-03-09 ENCOUNTER — Ambulatory Visit: Payer: Managed Care, Other (non HMO) | Admitting: Family Medicine

## 2020-03-09 ENCOUNTER — Encounter: Payer: Self-pay | Admitting: Family Medicine

## 2020-03-09 ENCOUNTER — Other Ambulatory Visit: Payer: Self-pay

## 2020-03-09 VITALS — BP 128/86 | HR 95 | Temp 97.0°F | Ht 64.0 in | Wt 205.1 lb

## 2020-03-09 DIAGNOSIS — G4489 Other headache syndrome: Secondary | ICD-10-CM | POA: Diagnosis not present

## 2020-03-09 DIAGNOSIS — G44219 Episodic tension-type headache, not intractable: Secondary | ICD-10-CM | POA: Diagnosis not present

## 2020-03-09 DIAGNOSIS — F411 Generalized anxiety disorder: Secondary | ICD-10-CM | POA: Diagnosis not present

## 2020-03-09 DIAGNOSIS — R42 Dizziness and giddiness: Secondary | ICD-10-CM | POA: Insufficient documentation

## 2020-03-09 MED ORDER — SERTRALINE HCL 25 MG PO TABS: 25.0000 mg | ORAL_TABLET | Freq: Every day | ORAL | 5 refills | Status: DC

## 2020-03-09 NOTE — Assessment & Plan Note (Signed)
Ref to neurology for this intermittently with tension symptoms  Lab Results  Component Value Date   ESRSEDRATE 13 02/25/2020   Lab Results  Component Value Date   CRP <1.0 02/25/2020   Reassuring

## 2020-03-09 NOTE — Assessment & Plan Note (Signed)
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  Hydroxyzine helps sleep but too sedating for daytime use  buspar caused increased anxiety  paxil - made head feel weird so she stopped it  Wished to try another ssri -px zoloft 25 mg daily in evening to start  Discussed expectations of SSRI medication including time to effectiveness and mechanism of action, also poss of side effects (early and late)- including mental fuzziness, weight or appetite change, nausea and poss of worse dep or anxiety (even suicidal thoughts)  Pt voiced understanding and will stop med and update if this occurs  She is genuinely worried about side effects and will update [place]  If well tolerated can titrate dose  Offered counseling referral-pt cannot organize that once she takes time off (will call us)

## 2020-03-09 NOTE — Assessment & Plan Note (Signed)
On/off  Unsure if related to headache/head pain  Ref to neuro made Reassuring exam today

## 2020-03-09 NOTE — Patient Instructions (Signed)
Take as much as time off as you can before retirement so you can handle the situation with your mom   As soon as you know when that is-call us and I will place the psychology referral and tell them that you will be able to answer the phone when they call   Take care of yourself  I will place a referral to neurology for headache and dizziness   Try zoloft 25 mg daily in the evening  Update me in 1-2 weeks with how you are  If intolerable side effects or worse in the meantime- hold the medication and let us know

## 2020-03-09 NOTE — Assessment & Plan Note (Signed)
Pt continues to have intermittent L sided headaches and at other times tension headaches Feels off balance at times as well  These symptoms seem to worsen anxiety and vice versa  Requested neurology referral- and ref done Reassuring exam today -inst to alert [place] if symptoms worsen  Also pt understands she can try the robaxin for tension headache if needed

## 2020-03-09 NOTE — Progress Notes (Signed)
Subjective:    Patient ID: Makayla [name], female    DOB: 1961/01/22, 59 y.o.   MRN: [id number]  This visit occurred during the SARS-CoV-2 public health emergency.  Safety protocols were in place, including screening questions prior to the visit, additional usage of staff PPE, and extensive cleaning of exam room while observing appropriate contact time as indicated for disinfecting solutions.    HPI Pt presents to discuss treatment for anxiety and also worries about her neurologic status   Wt Readings from Last 3 Encounters:  03/09/20 205 lb 1 oz (93 kg)  02/25/20 205 lb 4 oz (93.1 kg)  01/08/20 202 lb 2 oz (91.7 kg)   35.20 kg/m  Past treatment  buspar-increased anxiety  paxil- did ok in the beginning and then felt weird in her head   Has taken xanax 2 times  Helps her head symptoms  Hydroxyzine - sedating-knocks her out/helps her sleep   Stressors  Work Trying to care with her mother -working on placement - there are $$ issues  Very difficult  Family is only somewhat helpful    Had headaches for a while (tension)-has a muscle relaxer to take if needed  One headache last night-took a tylenol and it went away   Was out of town she had some episodes of feeling off balance  Unsure if anxiety related    She looks up a lot of health things  Is very worried about MS  A lot of symptoms are on her L side  (but no weakness or numbness)   Patient Active Problem List   Diagnosis Date Noted  . Dizziness 03/09/2020  . Headache 02/25/2020  . Pain head 02/25/2020  . Tingling 01/08/2020  . Itching 01/08/2020  . Pulsatile tinnitus of right ear 09/30/2019  . Immunization reaction 09/08/2019  . Tachycardia 10/03/2018  . Grief reaction 10/03/2018  . Elevated glucose level 01/16/2018  . Generalized anxiety disorder 01/23/2017  . Chest pain 11/02/2016  . Left knee pain 09/13/2015  . Vitamin D deficiency 05/20/2015  . Obesity 05/20/2015  . History of malignant phylloides tumor  of breast 01/22/2012  . Screening mammogram, encounter for 10/25/2011  . Routine general medical examination at a health care facility 10/16/2011  . Hyperlipidemia LDL goal <130 12/04/2007  . GERD 12/04/2007  . Essential hypertension 12/04/2007   Past Medical History:  Diagnosis Date  . Bursitis, trochanteric    bi/lat   . Dental crowns present    x 4  . GERD (gastroesophageal reflux disease)   . Hyperlipidemia    no current med.  . Hypertension    white coat syndrome,  . Mass of breast, left 01/2012  . Obesity    Past Surgical History:  Procedure Laterality Date  . ABDOMINAL HYSTERECTOMY     partial  . BREAST EXCISIONAL BIOPSY Left 2013  . BREAST SURGERY  01/10/12   lumpectomy - left  . CHOLECYSTECTOMY N/A 02/10/2016   Procedure: LAPAROSCOPIC CHOLECYSTECTOMY WITH INTRAOPERATIVE CHOLANGIOGRAM;  Surgeon: Autumn Messing III, MD;  Location: [facility];  Service: General;  Laterality: N/A;  . RECTOCELE REPAIR  06/14/2011   Procedure: POSTERIOR REPAIR (RECTOCELE);  Surgeon: Dutch Gray, MD;  Location: [facility];  Service: Urology;  Laterality: N/A;  cystoscopy, SPARC sling  . ROBOTIC ASSISTED LAPAROSCOPIC SACROCOLPOPEXY  06/14/2011   Procedure: ROBOTIC ASSISTED LAPAROSCOPIC SACROCOLPOPEXY;  Surgeon: Dutch Gray, MD;  Location: [facility];  Service: Urology;  Laterality: N/A;  . TUBAL LIGATION     Social History  Tobacco Use  . Smoking status: Never Smoker  . Smokeless tobacco: Never Used  Substance Use Topics  . Alcohol use: No    Alcohol/week: 0.0 standard drinks  . Drug use: No   Family History  Problem Relation Age of Onset  . Hypertension Mother   . Hyperlipidemia Mother   . Diabetes Mother   . Cancer Mother        uterine  . Breast cancer Mother 49  . GER disease Father   . Depression Sister   . Heart disease Brother   . Heart attack Maternal Grandmother   . Hypertension Maternal Grandmother    Allergies  Allergen Reactions  . Buspar [Buspirone]     Made her more anxious    Current Outpatient Medications on File Prior to Visit  Medication Sig Dispense Refill  . ALPRAZolam (XANAX) 0.5 MG tablet Take 1 tablet (0.5 mg total) by mouth 2 (two) times daily as needed for anxiety. Caution of sedation 15 tablet 0  . amLODipine (NORVASC) 2.5 MG tablet Take 1 tablet (2.5 mg total) by mouth daily. 90 tablet 3  . esomeprazole (NEXIUM) 20 MG capsule Take 20 mg by mouth daily at 12 noon.    . fluticasone (FLONASE) 50 MCG/ACT nasal spray Place 2 sprays into both nostrils daily. 16 g 6  . hydrOXYzine (ATARAX/VISTARIL) 10 MG tablet Take 1 tablet (10 mg total) by mouth 3 (three) times daily as needed (and to help sleep). Caution of sedation 30 tablet 1  . ibuprofen (ADVIL) 800 MG tablet TAKE 1 TABLET BY MOUTH EVERY 8 HOURS AS NEEDED FOR PAIN 90 tablet 1  . Vitamin D, Cholecalciferol, 25 MCG (1000 UT) TABS Take 5,000 Units by mouth daily.    . methocarbamol (ROBAXIN) 500 MG tablet Take 1 tablet (500 mg total) by mouth every 8 (eight) hours as needed for muscle spasms. As needed for headache, caution of sedation (Patient not taking: Reported on 03/09/2020) 30 tablet 1   No current facility-administered medications on file prior to visit.     Review of Systems  Constitutional: Positive for fatigue. Negative for activity change, appetite change, fever and unexpected weight change.  HENT: Negative for congestion, ear pain, rhinorrhea, sinus pressure and sore throat.   Eyes: Negative for pain, redness and visual disturbance.  Respiratory: Negative for cough, shortness of breath and wheezing.   Cardiovascular: Negative for chest pain and palpitations.  Gastrointestinal: Negative for abdominal pain, blood in stool, constipation and diarrhea.  Endocrine: Negative for polydipsia and polyuria.  Genitourinary: Negative for dysuria, frequency and urgency.  Musculoskeletal: Positive for back pain. Negative for arthralgias and myalgias.  Skin: Negative for pallor and rash.   Allergic/Immunologic: Negative for environmental allergies.  Neurological: Positive for dizziness and headaches. Negative for tremors, seizures, syncope, speech difficulty, weakness and numbness.  Hematological: Negative for adenopathy. Does not bruise/bleed easily.  Psychiatric/Behavioral: Positive for sleep disturbance. Negative for decreased concentration and dysphoric mood. The patient is nervous/anxious.        Objective:   Physical Exam Constitutional:      General: She is not in acute distress.    Appearance: Normal appearance. She is well-developed. She is obese. She is not ill-appearing or diaphoretic.  HENT:     Head: Normocephalic and atraumatic.     Right Ear: External ear normal.     Left Ear: External ear normal.     Nose: Nose normal.     Mouth/Throat:     Pharynx: No oropharyngeal exudate.  Eyes:     General: No scleral icterus.       Right eye: No discharge.        Left eye: No discharge.     Conjunctiva/sclera: Conjunctivae normal.     Pupils: Pupils are equal, round, and reactive to light.     Comments: No nystagmus  Neck:     Thyroid: No thyromegaly.     Vascular: No carotid bruit or JVD.     Trachea: No tracheal deviation.  Cardiovascular:     Rate and Rhythm: Normal rate and regular rhythm.     Heart sounds: Normal heart sounds. No murmur heard.   Pulmonary:     Effort: Pulmonary effort is normal. No respiratory distress.     Breath sounds: Normal breath sounds. No wheezing or rales.  Abdominal:     General: Bowel sounds are normal. There is no distension.     Palpations: Abdomen is soft. There is no mass.     Tenderness: There is no abdominal tenderness.  Musculoskeletal:        General: No tenderness.     Cervical back: Full passive range of motion without pain, normal range of motion and neck supple.     Right lower leg: No edema.     Left lower leg: No edema.  Lymphadenopathy:     Cervical: No cervical adenopathy.  Skin:    General: Skin is  warm and dry.     Coloration: Skin is not pale.     Findings: No rash.  Neurological:     Mental Status: She is alert and oriented to person, place, and time.     Cranial Nerves: No cranial nerve deficit.     Sensory: No sensory deficit.     Motor: No weakness, tremor, atrophy or abnormal muscle tone.     Coordination: Romberg sign negative. Coordination normal.     Gait: Gait is intact. Gait normal.     Deep Tendon Reflexes: Reflexes are normal and symmetric. Reflexes normal.     Comments: No focal cerebellar signs   Psychiatric:        Mood and Affect: Mood is anxious.        Behavior: Behavior normal.        Thought Content: Thought content normal.        Cognition and Memory: Cognition and memory normal.     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  Hydroxyzine helps sleep but too sedating for daytime use  buspar caused increased anxiety  paxil - made head feel weird so she stopped it  Wished to try another ssri -px zoloft 25 mg daily in evening to start  Discussed expectations of SSRI medication including time to effectiveness and mechanism of action, also poss of side effects (early and late)- including mental fuzziness, weight or appetite change, nausea and poss of worse dep or anxiety (even suicidal thoughts)  Pt voiced understanding and will stop med and update if this occurs  She is genuinely worried about side effects and will update [place]  If well tolerated can titrate dose  Offered counseling referral-pt cannot  organize that once she takes time off (will call us)        Relevant Medications   sertraline (ZOLOFT) 25 MG tablet   Headache    Pt continues to have  intermittent L sided headaches and at other times tension headaches Feels off balance at times as well  These symptoms seem to worsen anxiety and vice versa  Requested neurology referral- and ref done Reassuring exam today -inst to alert [place] if symptoms worsen  Also pt understands she can try the robaxin for tension headache if needed      Relevant Medications   sertraline (ZOLOFT) 25 MG tablet   Other Relevant Orders   Ambulatory referral to Neurology   Ambulatory referral to Neurology   Pain head    Ref to neurology for this intermittently with tension symptoms  Lab Results  Component Value Date   ESRSEDRATE 13 02/25/2020   Lab Results  Component Value Date   CRP <1.0 02/25/2020   Reassuring       Relevant Medications   sertraline (ZOLOFT) 25 MG tablet   Other Relevant Orders   Ambulatory referral to Neurology   Ambulatory referral to Neurology   Dizziness    On/off  Unsure if related to headache/head pain  Ref to neuro made Reassuring exam today      Relevant Orders   Ambulatory referral to Neurology

## 2020-03-14 ENCOUNTER — Ambulatory Visit: Payer: Managed Care, Other (non HMO) | Admitting: Neurology

## 2020-03-14 ENCOUNTER — Encounter: Payer: Self-pay | Admitting: Neurology

## 2020-03-14 ENCOUNTER — Other Ambulatory Visit: Payer: Self-pay

## 2020-03-14 VITALS — BP 134/86 | HR 102 | Ht 63.0 in | Wt 203.0 lb

## 2020-03-14 DIAGNOSIS — R519 Headache, unspecified: Secondary | ICD-10-CM | POA: Insufficient documentation

## 2020-03-14 DIAGNOSIS — F5104 Psychophysiologic insomnia: Secondary | ICD-10-CM | POA: Diagnosis not present

## 2020-03-14 DIAGNOSIS — F4321 Adjustment disorder with depressed mood: Secondary | ICD-10-CM

## 2020-03-14 DIAGNOSIS — F4381 Prolonged grief disorder: Secondary | ICD-10-CM

## 2020-03-14 DIAGNOSIS — H5712 Ocular pain, left eye: Secondary | ICD-10-CM | POA: Diagnosis not present

## 2020-03-14 DIAGNOSIS — F4329 Adjustment disorder with other symptoms: Secondary | ICD-10-CM

## 2020-03-14 MED ORDER — HYDROXYZINE HCL 25 MG PO TABS: 25.0000 mg | ORAL_TABLET | Freq: Every evening | ORAL | 3 refills | Status: DC

## 2020-03-14 NOTE — Progress Notes (Signed)
Provider:  Larey Seat, MD  Primary Care Physician:  Tower, Wynelle Fanny, MD [place] [address]     Referring Provider: Abner Greenspan, [place],  [place] [id number]          Chief Complaint according to patient   Patient presents with:    . New Patient (Initial Visit)     pt alone, rm 11. RN: patient presents today for new onset headaches started  around 5-6 weeks ago. Initially, she developed pain behind the eyes as well as eye pressure. She had stiffness in the neck and her balance was off. she states the balance concern is better. the headaches are still present 2-3 times a week.  sometimes it will start in left temporal area and pain moves behind the eyes and into the cheek bone on left side. She denies dizziness or history of headaches.      HISTORY OF PRESENT ILLNESS:  GIAH FICKETT is a 59 year- old Caucasian female patient and seen here upon referral on 03/14/2020 from Dr/ Glori Bickers, Chief concern according to patient :  New onset headaches , 4-6 weeks ago she felt as if her muscles were all tense in neck and nape- but not in the temple, not initially. She was given a medication which she has not taken.  She has since felt some sharp , stinging pain in the left ear and left jaw and around the left eye.  Sometimes feels as if there is band around the head  .   I have the pleasure of seeing PRUDY CANDY today, a right -handed Caucasian female with a new headaches - more than one type.   She  has a past medical history of Bursitis, trochanteric, Dental crowns present, GERD (gastroesophageal reflux disease), Hyperlipidemia, Hypertension, Mass of breast, left (01/2012), and Obesity.  She feels a little lightheaded. BMI 35.  She had 2 migraines while she worked night shifts.       Family medical /sleep history: two sisters with migraines.  She lost her husband in 09-13-18,  Died of the flu, pneumonia, ARDS for 28 days. .    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 She lives in a household alone. Family status is widowed , with  Grown daughter and 1 granddaughter.  The patient currentlyused to work in shifts( night/ rotating,) Pets are not present. Tobacco use; never .  ETOH use ; none , Caffeine intake in form of Coffee( 1 cup in AM ) Soda( /) Tea (/ ) or energy drinks. Regular exercise in form of 30 minutes of walking.  She is reducing her caffeine as it gave her palpitations.     Sleep habits are as follows: The patient's dinner time is variable around 7 Pm,  The patient goes to bed at 10.30 PM and has trouble to sleep or continue to sleep . Insomnia since last 2022/09/13-   The preferred sleep position is left side , with the support of 1 pillow.  Dreams are reportedly rare.  5.15 AM is the usual rise time. The patient wakes up with an alarm, ost morning she is already awake. .  She reports not feeling refreshed or restored in AM, no morning headaches always residual fatigue. Naps are taken infrequently,   Review of Systems: Out of a complete 14 system review, the patient complains of only  the following symptoms, and all other reviewed systems are negative.:    Tension headaches with retro-orbital  New pressure component.  Fatigue, sleepiness , snoring, fragmented sleep, Insomnia since her husband died.    How likely are you to doze in the following situations: 0 = not likely, 1 = slight chance, 2 = moderate chance, 3 = high chance   Sitting and Reading? Watching Television? Sitting inactive in a public place (theater or meeting)? As a passenger in a car for an hour without a break? Lying down in the afternoon when circumstances permit? Sitting and talking to someone? Sitting quietly after lunch without alcohol? In a car, while stopped for a few minutes in traffic?   Total = 2/ 24 points   FSS endorsed at 30/ 63 points.   Social History   Socioeconomic History  . Marital  status: Widowed    Spouse name: Not on file  . Number of children: 1  . Years of education: Not on file  . Highest education level: Not on file  Occupational History  . Occupation: Therapist, sports  Tobacco Use  . Smoking status: Never Smoker  . Smokeless tobacco: Never Used  Substance and Sexual Activity  . Alcohol use: No    Alcohol/week: 0.0 standard drinks  . Drug use: No  . Sexual activity: Not on file    Comment: has used estradiol patch 0.025  in the past, has stopped  Other Topics Concern  . Not on file  Social History Narrative  . Not on file   Social Determinants of Health   Financial Resource Strain:   . Difficulty of Paying Living Expenses:   Food Insecurity:   . Worried About Charity fundraiser in the Last Year:   . Arboriculturist in the Last Year:   Transportation Needs:   . Film/video editor (Medical):   Marland Kitchen Lack of Transportation (Non-Medical):   Physical Activity:   . Days of Exercise per Week:   . Minutes of Exercise per Session:   Stress:   . Feeling of Stress :   Social Connections:   . Frequency of Communication with Friends and Family:   . Frequency of Social Gatherings with Friends and Family:   . Attends Religious Services:   . Active Member of Clubs or Organizations:   . Attends Archivist Meetings:   Marland Kitchen Marital Status:     Family History  Problem Relation Age of Onset  . Hypertension Mother   . Hyperlipidemia Mother   . Diabetes Mother   . Cancer Mother        uterine  . Breast cancer Mother 49  . GER disease Father   . Depression Sister   . Heart disease Brother   . Heart attack Maternal Grandmother   . Hypertension Maternal Grandmother     Past Medical History:  Diagnosis Date  . Bursitis, trochanteric    bi/lat   . Dental crowns present    x 4  . GERD (gastroesophageal reflux disease)   . Hyperlipidemia    no current med.  . Hypertension    white coat syndrome,  . Mass of breast, left 01/2012  . Obesity     Past  Surgical History:  Procedure Laterality Date  . ABDOMINAL HYSTERECTOMY     partial  . BREAST EXCISIONAL BIOPSY Left 2013  . BREAST SURGERY  01/10/12   lumpectomy - left  . CHOLECYSTECTOMY N/A 02/10/2016   Procedure: LAPAROSCOPIC CHOLECYSTECTOMY WITH INTRAOPERATIVE  CHOLANGIOGRAM;  Surgeon: Autumn Messing III, MD;  Location: [facility];  Service: General;  Laterality: N/A;  . RECTOCELE REPAIR  06/14/2011   Procedure: POSTERIOR REPAIR (RECTOCELE);  Surgeon: Dutch Gray, MD;  Location: [facility];  Service: Urology;  Laterality: N/A;  cystoscopy, SPARC sling  . ROBOTIC ASSISTED LAPAROSCOPIC SACROCOLPOPEXY  06/14/2011   Procedure: ROBOTIC ASSISTED LAPAROSCOPIC SACROCOLPOPEXY;  Surgeon: Dutch Gray, MD;  Location: [facility];  Service: Urology;  Laterality: N/A;  . TUBAL LIGATION       Current Outpatient Medications on File Prior to Visit  Medication Sig Dispense Refill  . ALPRAZolam (XANAX) 0.5 MG tablet Take 1 tablet (0.5 mg total) by mouth 2 (two) times daily as needed for anxiety. Caution of sedation 15 tablet 0  . amLODipine (NORVASC) 2.5 MG tablet Take 1 tablet (2.5 mg total) by mouth daily. 90 tablet 3  . esomeprazole (NEXIUM) 20 MG capsule Take 20 mg by mouth daily at 12 noon.    . fluticasone (FLONASE) 50 MCG/ACT nasal spray Place 2 sprays into both nostrils daily. 16 g 6  . hydrOXYzine (ATARAX/VISTARIL) 10 MG tablet Take 1 tablet (10 mg total) by mouth 3 (three) times daily as needed (and to help sleep). Caution of sedation 30 tablet 1  . ibuprofen (ADVIL) 800 MG tablet TAKE 1 TABLET BY MOUTH EVERY 8 HOURS AS NEEDED FOR PAIN 90 tablet 1  . sertraline (ZOLOFT) 25 MG tablet Take 1 tablet (25 mg total) by mouth daily. 30 tablet 5  . Vitamin D, Cholecalciferol, 25 MCG (1000 UT) TABS Take 5,000 Units by mouth daily.    . methocarbamol (ROBAXIN) 500 MG tablet Take 1 tablet (500 mg total) by mouth every 8 (eight) hours as needed for muscle spasms. As needed for headache, caution of sedation (Patient not taking:  Reported on 03/09/2020) 30 tablet 1   No current facility-administered medications on file prior to visit.    Allergies  Allergen Reactions  . Buspar [Buspirone]     Made her more anxious    Physical exam:  Today's Vitals   03/14/20 1439  BP: 134/86  Pulse: (!) 102  Weight: 203 lb (92.1 kg)  Height: 5\' 3"  (1.6 m)   Body mass index is 35.96 kg/m.   Wt Readings from Last 3 Encounters:  03/14/20 203 lb (92.1 kg)  03/09/20 205 lb 1 oz (93 kg)  02/25/20 205 lb 4 oz (93.1 kg)     Ht Readings from Last 3 Encounters:  03/14/20 5\' 3"  (1.6 m)  03/09/20 5\' 4"  (1.626 m)  02/25/20 5\' 4"  (1.626 m)      General: The patient is awake, alert and appears not in acute distress. The patient is well groomed. Head: Normocephalic, atraumatic. Neck is supple. Mallampati  3,  neck circumference:14  inches . Nasal airflow  patent.  Retrognathia is not  seen.   Cardiovascular:  Regular rate and cardiac rhythm by pulse,  without distended neck veins. Respiratory: Lungs are clear to auscultation.  Skin:  Without evidence of ankle edema, or rash. Trunk: The patient's posture is erect.   Neurologic exam : The patient is awake and alert, oriented to place and time.   Memory subjective described as intact.  Attention span & concentration ability appears normal.  Speech is fluent,  without  dysarthria, dysphonia or aphasia.  Mood and affect are appropriate.   Cranial nerves: no loss of smell or taste reported  Pupils are equal and briskly reactive to light. Funduscopic exam normal. .  Extraocular movements in vertical and horizontal planes were intact and without nystagmus. No Diplopia. Visual fields by finger perimetry are intact. Hearing was intact to soft voice and finger rubbing.    Facial sensation intact to fine touch.  Facial motor strength is symmetric and tongue and uvula move midline.  Neck ROM : rotation, tilt and flexion extension were normal for age and shoulder shrug was  symmetrical.    Motor exam:  Symmetric bulk, tone and ROM.   Normal tone without co wheeling, symmetric grip strength .   Sensory:  Fine touch, pinprick and vibration were tested  and  normal.  Proprioception tested in the upper extremities was normal.   Coordination: Rapid alternating movements in the fingers/hands were of normal speed.  The Finger-to-nose maneuver was intact without evidence of ataxia, dysmetria or tremor.   Gait and station: Patient could rise unassisted from a seated position, walked without assistive device.  Stance is of normal width/ base and the patient turned with 3 steps.  Toe and heel walk were deferred.  Deep tendon reflexes: in the  upper and lower extremities are symmetric and intact.  Babinski response was deferred.      After spending a total time of 45  minutes face to face and additional time for physical and neurologic examination, review of laboratory studies,  personal review of imaging studies, reports and results of other testing and review of referral information / records as far as provided in visit, I have established the following assessments:  1) New onset tension headaches. 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  2) insomnia-  There is a lot of worry and stress on her- and she still hasn't slept well for one night since her husband died.  3)  BMI elevated-   My Plan is to proceed with:  1)  Sleep aid- trazodone 50 mg tab, try 25 mg first.  2)  Zoloft was ordered by PCP for anxiety attacks. She has noted no effect on sleep.  She can combine that in AM with vistaril at night.  I will increase the dose.  3)  Grief counseling is very important. Not a virtual visit - join a group at hospice.   I would like to thank Abner Greenspan, [place],  [place] [id number] for allowing me to meet with and to take care of this pleasant patient.   In short, DONYE DAUENHAUER is presenting  with stress related tension headaches and grief related insomnia. I plan to follow up either personally or through our NP within 2-3 month.   Vistraril was increased to 25 mg and she will take it with Zoloft for at least 14 days.   Ibuprofen is allowed.   CC: I will share my notes with PCP   Electronically signed by: Larey Seat, MD 03/14/2020 3:19 PM  [facility] Sleep Board certified by The [employer] of Sleep Medicine and Diplomate of the [employer] of Sleep Medicine. Board certified In Neurology through the [place], Fellow of the [employer] of Neurology. Medical Director of [employer].

## 2020-03-14 NOTE — Patient Instructions (Signed)

## 2020-03-15 ENCOUNTER — Telehealth: Payer: Self-pay | Admitting: Neurology

## 2020-03-15 NOTE — Telephone Encounter (Signed)
[employer] order sent to [facility]. They will obtain the auth and reach out to the patient to schedule.  

## 2020-03-16 NOTE — Telephone Encounter (Signed)
Novella Rob: [id number] (exp. 03/16/20 to 06/14/20)

## 2020-03-21 ENCOUNTER — Ambulatory Visit
Admission: RE | Admit: 2020-03-21 | Discharge: 2020-03-21 | Disposition: A | Discharge status: Home / Self Care | Payer: Managed Care, Other (non HMO) | Source: Ambulatory Visit | Attending: Neurology | Admitting: Neurology

## 2020-03-21 ENCOUNTER — Other Ambulatory Visit: Payer: Self-pay

## 2020-03-21 DIAGNOSIS — H5712 Ocular pain, left eye: Secondary | ICD-10-CM

## 2020-03-21 DIAGNOSIS — R519 Headache, unspecified: Secondary | ICD-10-CM

## 2020-03-21 MED ORDER — GADOBENATE DIMEGLUMINE 529 MG/ML IV SOLN
20.0000 mL | Freq: Once | INTRAVENOUS | Status: AC | PRN
  Administered 2020-03-21: 20 mL via INTRAVENOUS

## 2020-03-22 NOTE — Progress Notes (Signed)
After the infusion of contrast material, a normal enhancement pattern is noted. This is a normal MRI overall, there are small little variations of the norm here- but no disease process. CD   IMPRESSION: This MRI of the brain with and without contrast shows the following: 1.  The pituitary gland has a reduced height within a mildly enlarged sella turcica.  This finding is usually incidental, especially since the optic nerve sheaths are normal in diameter.  If papilledema was present, consider elevated intracranial pressure. 2.   There is a punctate focus of increased susceptibility at the junction of the left parietotemporal lobe.  This could represent a single chronic microhemorrhage.  In isolation, this is unlikely to be clinically significant. 3.   Normal enhancement pattern and no acute findings.    INTERPRETING PHYSICIAN:  Richard A. Felecia Shelling, MD, PhD, Charlynn Grimes

## 2020-03-22 NOTE — Progress Notes (Signed)
This MRI of the brain with and without contrast shows the following: 1.  The pituitary gland has a reduced height within a mildly enlarged sella turcica.  This finding is usually incidental, especially since the optic nerve sheaths are normal in diameter.  If papilledema was present, consider elevated intracranial pressure. 2.   There is a punctate focus of increased susceptibility at the junction of the left parietotemporal lobe.  This could represent a single chronic microhemorrhage.  In isolation, this is unlikely to be clinically significant. 3.   Normal enhancement pattern and no acute findings    INTERPRETING PHYSICIAN:  Richard A. Felecia Shelling, MD, PhD, Charlynn Grimes  [phone] Certified in  Neuroimaging by [employer] Neuro[employer]

## 2020-03-23 ENCOUNTER — Encounter: Payer: Self-pay | Admitting: Neurology

## 2020-04-03 ENCOUNTER — Other Ambulatory Visit: Payer: Self-pay

## 2020-04-03 ENCOUNTER — Encounter: Payer: Self-pay | Admitting: Emergency Medicine

## 2020-04-03 ENCOUNTER — Emergency Department: Payer: Managed Care, Other (non HMO)

## 2020-04-03 DIAGNOSIS — Z79899 Other long term (current) drug therapy: Secondary | ICD-10-CM | POA: Diagnosis not present

## 2020-04-03 DIAGNOSIS — R1030 Lower abdominal pain, unspecified: Secondary | ICD-10-CM | POA: Diagnosis present

## 2020-04-03 DIAGNOSIS — K219 Gastro-esophageal reflux disease without esophagitis: Secondary | ICD-10-CM | POA: Insufficient documentation

## 2020-04-03 DIAGNOSIS — I10 Essential (primary) hypertension: Secondary | ICD-10-CM | POA: Insufficient documentation

## 2020-04-03 DIAGNOSIS — K5900 Constipation, unspecified: Secondary | ICD-10-CM | POA: Insufficient documentation

## 2020-04-03 LAB — COMPREHENSIVE METABOLIC PANEL
ALT: 21 U/L (ref 0–44)
AST: 15 U/L (ref 15–41)
Albumin: 4.2 g/dL (ref 3.5–5.0)
Alkaline Phosphatase: 99 U/L (ref 38–126)
Anion gap: 10 (ref 5–15)
BUN: 10 mg/dL (ref 6–20)
CO2: 25 mmol/L (ref 22–32)
Calcium: 9.5 mg/dL (ref 8.9–10.3)
Chloride: 103 mmol/L (ref 98–111)
Creatinine, Ser: 0.96 mg/dL (ref 0.44–1.00)
GFR calc Af Amer: >60 mL/min (ref >60)
GFR calc non Af Amer: >60 mL/min (ref >60)
Glucose, Bld: 120 mg/dL — ABNORMAL HIGH (ref 70–99)
Potassium: 3.9 mmol/L (ref 3.5–5.1)
Sodium: 138 mmol/L (ref 135–145)
Total Bilirubin: 0.7 mg/dL (ref 0.3–1.2)
Total Protein: 7.2 g/dL (ref 6.5–8.1)

## 2020-04-03 LAB — CBC
HCT: 44.4 % (ref 36.0–46.0)
Hemoglobin: 14.9 g/dL (ref 12.0–15.0)
MCH: 28 pg (ref 26.0–34.0)
MCHC: 33.6 g/dL (ref 30.0–36.0)
MCV: 83.5 fL (ref 80.0–100.0)
Platelets: 136 10*3/uL — ABNORMAL LOW (ref 150–400)
RBC: 5.32 MIL/uL — ABNORMAL HIGH (ref 3.87–5.11)
RDW: 13.9 % (ref 11.5–15.5)
WBC: 11.9 10*3/uL — ABNORMAL HIGH (ref 4.0–10.5)
nRBC: 0 % (ref 0.0–0.2)

## 2020-04-03 LAB — TROPONIN I (HIGH SENSITIVITY)
Troponin I (High Sensitivity): 2 ng/L (ref <18)
Troponin I (High Sensitivity): 3 ng/L (ref <18)

## 2020-04-03 MED ORDER — ONDANSETRON 4 MG PO TBDP
4.0000 mg | ORAL_TABLET | Freq: Once | ORAL | Status: AC
  Administered 2020-04-03: 4 mg via ORAL

## 2020-04-03 MED ORDER — ONDANSETRON 4 MG PO TBDP
ORAL_TABLET | ORAL | Status: AC
  Filled 2020-04-03: qty 1

## 2020-04-03 MED ORDER — OXYCODONE-ACETAMINOPHEN 5-325 MG PO TABS
1.0000 | ORAL_TABLET | ORAL | Status: DC | PRN
  Administered 2020-04-03: 1 via ORAL
  Filled 2020-04-03: qty 1

## 2020-04-03 NOTE — ED Triage Notes (Signed)
Pt reports lower abd pain since Thursday and then today it radiated up to her mid chest. Pt reports some nausea as well and describes the pain as sharp, burning and like a big cramp. Denies VD.

## 2020-04-04 ENCOUNTER — Encounter: Payer: Self-pay | Admitting: Radiology

## 2020-04-04 ENCOUNTER — Emergency Department: Payer: Managed Care, Other (non HMO)

## 2020-04-04 ENCOUNTER — Emergency Department
Admission: EM | Admit: 2020-04-04 | Discharge: 2020-04-04 | Disposition: A | Discharge status: Home / Self Care | Payer: Managed Care, Other (non HMO) | Attending: Emergency Medicine | Admitting: Emergency Medicine

## 2020-04-04 DIAGNOSIS — K5900 Constipation, unspecified: Secondary | ICD-10-CM

## 2020-04-04 DIAGNOSIS — R1030 Lower abdominal pain, unspecified: Secondary | ICD-10-CM

## 2020-04-04 LAB — URINALYSIS, COMPLETE (UACMP) WITH MICROSCOPIC
Bacteria, UA: NONE SEEN
Bilirubin Urine: NEGATIVE
Glucose, UA: NEGATIVE mg/dL
Hgb urine dipstick: NEGATIVE
Ketones, ur: NEGATIVE mg/dL
Leukocytes,Ua: NEGATIVE
Nitrite: NEGATIVE
Protein, ur: NEGATIVE mg/dL
Specific Gravity, Urine: 1.027 (ref 1.005–1.030)
Squamous Epithelial / HPF: NONE SEEN (ref 0–5)
pH: 6 (ref 5.0–8.0)

## 2020-04-04 MED ORDER — SODIUM CHLORIDE 0.9 % IV BOLUS
1000.0000 mL | Freq: Once | INTRAVENOUS | Status: AC
  Administered 2020-04-04: 1000 mL via INTRAVENOUS

## 2020-04-04 MED ORDER — ONDANSETRON HCL 4 MG/2ML IJ SOLN
4.0000 mg | Freq: Once | INTRAMUSCULAR | Status: AC
  Administered 2020-04-04: 4 mg via INTRAVENOUS
  Filled 2020-04-04: qty 2

## 2020-04-04 MED ORDER — DICYCLOMINE HCL 20 MG PO TABS
20.0000 mg | ORAL_TABLET | Freq: Four times a day (QID) | ORAL | 0 refills | Status: DC
Start: 2020-04-04 — End: 2020-06-15

## 2020-04-04 MED ORDER — IOHEXOL 9 MG/ML PO SOLN
500.0000 mL | ORAL | Status: AC
  Administered 2020-04-04 (×2): 500 mL via ORAL

## 2020-04-04 MED ORDER — IOHEXOL 300 MG/ML  SOLN
100.0000 mL | Freq: Once | INTRAMUSCULAR | Status: AC | PRN
  Administered 2020-04-04: 100 mL via INTRAVENOUS

## 2020-04-04 MED ORDER — MORPHINE SULFATE (PF) 4 MG/ML IV SOLN
4.0000 mg | Freq: Once | INTRAVENOUS | Status: DC
  Filled 2020-04-04: qty 1

## 2020-04-04 MED ORDER — LACTULOSE 10 GM/15ML PO SOLN
20.0000 g | Freq: Every day | ORAL | 0 refills | Status: DC | PRN
Start: 2020-04-04 — End: 2020-06-15

## 2020-04-04 NOTE — ED Notes (Signed)
Patient given update on wait time. Patient verbalizes understanding.  

## 2020-04-04 NOTE — ED Notes (Signed)
Pt declined morphine at this time. Pain 0 on 1/10 scale.

## 2020-04-04 NOTE — ED Provider Notes (Signed)
[facility] Emergency Department Provider Note   ____________________________________________   First MD Initiated Contact with Patient 04/04/20 [phone]     (approximate)  I have reviewed the triage vital signs and the nursing notes.   HISTORY  Chief Complaint Abdominal Pain and Chest Pain    HPI Makayla [name] is a 59 y.o. female who presents to the ED from home with a chief complaint of abdominal pain.  Patient reports a 4-day history of initially lower abdominal pain, now more generalized and today it radiated into her mid chest.  Symptoms associated with nausea.  Describes pain as sharp, burning and cramping, radiating to her back.  Denies fever, cough, chest pain, shortness of breath, vomiting, dysuria, diarrhea.  Denies recent travel, camping, trauma or antibiotic use.      Past Medical History:  Diagnosis Date  . Bursitis, trochanteric    bi/lat   . Dental crowns present    x 4  . GERD (gastroesophageal reflux disease)   . Hyperlipidemia    no current med.  . Hypertension    white coat syndrome,  . Mass of breast, left 01/2012  . Obesity     Patient Active Problem List   Diagnosis Date Noted  . Insomnia, psychophysiological 03/14/2020  . 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 Retro-orbital pain of left eye 03/14/2020  . Left temporal headache 03/14/2020  . Dizziness 03/09/2020  . Headache 02/25/2020  . Pain head 02/25/2020  . Tingling 01/08/2020  . Itching 01/08/2020  . Pulsatile tinnitus of right ear 09/30/2019  . Immunization reaction 09/08/2019  . Tachycardia 10/03/2018  . Grief reaction 10/03/2018  . Elevated glucose level 01/16/2018  . Generalized anxiety disorder 01/23/2017  . Chest pain 11/02/2016  . Left knee pain 09/13/2015  . Vitamin D deficiency 05/20/2015  . Obesity 05/20/2015  . 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 Screening mammogram, encounter for 10/25/2011  . Routine  general medical examination at a health care facility 10/16/2011  . Hyperlipidemia LDL goal <130 12/04/2007  . GERD 12/04/2007  . Essential hypertension 12/04/2007    Past Surgical History:  Procedure Laterality Date  . ABDOMINAL HYSTERECTOMY     partial  . BREAST EXCISIONAL BIOPSY Left 2013  . BREAST SURGERY  01/10/12   lumpectomy - left  . CHOLECYSTECTOMY N/A 02/10/2016   Procedure: LAPAROSCOPIC CHOLECYSTECTOMY WITH INTRAOPERATIVE CHOLANGIOGRAM;  Surgeon: Autumn Messing III, MD;  Location: [place];  Service: General;  Laterality: N/A;  . RECTOCELE REPAIR  06/14/2011   Procedure: POSTERIOR REPAIR (RECTOCELE);  Surgeon: Dutch Gray, MD;  Location: [facility];  Service: Urology;  Laterality: N/A;  cystoscopy, SPARC sling  . ROBOTIC ASSISTED LAPAROSCOPIC SACROCOLPOPEXY  06/14/2011   Procedure: ROBOTIC ASSISTED LAPAROSCOPIC SACROCOLPOPEXY;  Surgeon: Dutch Gray, MD;  Location: [facility];  Service: Urology;  Laterality: N/A;  . TUBAL LIGATION      Prior to Admission medications   Medication Sig Start Date End Date Taking? Authorizing Provider  ALPRAZolam Duanne Moron) 0.5 MG tablet Take 1 tablet (0.5 mg total) by mouth 2 (two) times daily as needed for anxiety. Caution of sedation 09/08/19   Tower, Roque Lias A, MD  amLODipine (NORVASC) 2.5 MG tablet Take 1 tablet (2.5 mg total) by mouth daily. 04/23/19   Tower, Wynelle Fanny, MD  dicyclomine (BENTYL) 20 MG tablet Take 1 tablet (20 mg total) by mouth every 6 (six) hours. 04/04/20   Paulette Blanch, MD  esomeprazole (NEXIUM) 20 MG capsule Take  20 mg by mouth daily at 12 noon.    [provider]  fluticasone (FLONASE) 50 MCG/ACT nasal spray Place 2 sprays into both nostrils daily. 09/30/19   Tower, Wynelle Fanny, MD  hydrOXYzine (ATARAX/VISTARIL) 10 MG tablet Take 1 tablet (10 mg total) by mouth 3 (three) times daily as needed (and to help sleep). Caution of sedation 09/16/19   Tower, Wynelle Fanny, MD  hydrOXYzine (ATARAX/VISTARIL) 25 MG tablet Take 1 tablet (25 mg total) by mouth at  bedtime. 03/14/20   Dohmeier, Asencion Partridge, MD  ibuprofen (ADVIL) 800 MG tablet TAKE 1 TABLET BY MOUTH EVERY 8 HOURS AS NEEDED FOR PAIN 05/25/19   Tower, Wynelle Fanny, MD  lactulose (CHRONULAC) 10 GM/15ML solution Take 30 mLs (20 g total) by mouth daily as needed for mild constipation. 04/04/20   Paulette Blanch, MD  methocarbamol (ROBAXIN) 500 MG tablet Take 1 tablet (500 mg total) by mouth every 8 (eight) hours as needed for muscle spasms. As needed for headache, caution of sedation Patient not taking: Reported on 03/09/2020 02/25/20   Tower, Wynelle Fanny, MD  sertraline (ZOLOFT) 25 MG tablet Take 1 tablet (25 mg total) by mouth daily. 03/09/20   Tower, Wynelle Fanny, MD  Vitamin D, Cholecalciferol, 25 MCG (1000 UT) TABS Take 5,000 Units by mouth daily.    [provider]    Allergies Buspar [buspirone]  Family History  Problem Relation Age of Onset  . Hypertension Mother   . Hyperlipidemia Mother   . Diabetes Mother   . Breast cancer Mother 69  . Uterine cancer Mother   . GER disease Father   . Depression Sister   . Heart disease Brother   . Heart attack Maternal Grandmother   . Hypertension Maternal Grandmother     Social History Social History   Tobacco Use  . Smoking status: Never Smoker  . Smokeless tobacco: Never Used  Substance Use Topics  . Alcohol use: No    Alcohol/week: 0.0 standard drinks  . Drug use: No    Review of Systems  Constitutional: No fever/chills Eyes: No visual changes. ENT: No sore throat. Cardiovascular: Denies chest pain. Respiratory: Denies shortness of breath. Gastrointestinal: No abdominal pain.  No nausea, no vomiting.  No diarrhea.  No constipation. Genitourinary: Negative for dysuria. Musculoskeletal: Negative for back pain. Skin: Negative for rash. Neurological: Negative for headaches, focal weakness or numbness.   ____________________________________________   PHYSICAL EXAM:  VITAL SIGNS: ED Triage Vitals [04/03/20 1737]  Enc Vitals Group      BP 137/84     Pulse Rate (!) 120     Resp 20     Temp 98.4 F (36.9 C)     Temp Source Oral     SpO2 95 %     Weight 202 lb (91.6 kg)     Height 5\' 3"  (1.6 m)     Head Circumference      Peak Flow      Pain Score 7     Pain Loc      Pain Edu?      Excl. in [place]?     Constitutional: Alert and oriented. Well appearing and in mild acute distress. Eyes: Conjunctivae are normal. PERRL. EOMI. Head: Atraumatic. Nose: No congestion/rhinnorhea. Mouth/Throat: Mucous membranes are moist.   Neck: No stridor.   Cardiovascular: Normal rate, regular rhythm. Grossly normal heart sounds.  Good peripheral circulation. Respiratory: Normal respiratory effort.  No retractions. Lungs CTAB. Gastrointestinal: Soft and mildly tender to palpation left  lower quadrant without rebound or guarding. No distention. No abdominal bruits. No CVA tenderness. Musculoskeletal: No lower extremity tenderness nor edema.  No joint effusions. Neurologic:  Normal speech and language. No gross focal neurologic deficits are appreciated. No gait instability. Skin:  Skin is warm, dry and intact. No rash noted. Psychiatric: Mood and affect are normal. Speech and behavior are normal.  ____________________________________________   LABS (all labs ordered are listed, but only abnormal results are displayed)  Labs Reviewed  CBC - Abnormal; Notable for the following components:      Result Value   WBC 11.9 (*)    RBC 5.32 (*)    Platelets 136 (*)    All other components within normal limits  COMPREHENSIVE METABOLIC PANEL - Abnormal; Notable for the following components:   Glucose, Bld 120 (*)    All other components within normal limits  URINALYSIS, COMPLETE (UACMP) WITH MICROSCOPIC  TROPONIN I (HIGH SENSITIVITY)  TROPONIN I (HIGH SENSITIVITY)   ____________________________________________  EKG  ED ECG REPORT I, Williamson Cavanah J, the attending physician, personally viewed and interpreted this ECG.   Date: 04/04/2020   EKG Time: 1741  Rate: 127  Rhythm: sinus tachycardia  Axis: Normal  Intervals:none  ST&T Change: Nonspecific  ____________________________________________  RADIOLOGY  ED MD interpretation: No acute cardiopulmonary process; diverticulosis without diverticulitis  Official radiology report(s): DG Chest 2 View  Result Date: 04/03/2020 CLINICAL DATA:  Pt reports lower abd pain since Thursday and then today it radiated up to her mid chest. Pt reports some nausea as well and describes the pain as sharp, burning and like a big cramp EXAM: CHEST - 2 VIEW COMPARISON:  Chest radiograph 10/01/2018 FINDINGS: The cardiomediastinal contours are within normal limits. The lungs are clear. No pneumothorax or pleural effusion. No acute finding in the visualized skeleton. The IMPRESSION: No active cardiopulmonary disease. Electronically Signed   By: Audie Pinto M.D.   On: 04/03/2020 18:04   CT Abdomen Pelvis W Contrast  Result Date: 04/04/2020 CLINICAL DATA:  Lower abdominal pain with nausea. EXAM: CT ABDOMEN AND PELVIS WITH CONTRAST TECHNIQUE: Multidetector CT imaging of the abdomen and pelvis was performed using the standard protocol following bolus administration of intravenous contrast. CONTRAST:  139mL OMNIPAQUE IOHEXOL 300 MG/ML  SOLN COMPARISON:  04/16/2011 FINDINGS: Lower chest: Unremarkable. Hepatobiliary: The liver shows diffusely decreased attenuation suggesting fat deposition. Small area of low attenuation in the anterior liver, adjacent to the falciform ligament, is in a characteristic location for focal fatty deposition. Fatty sparing noted in the caudate lobe. Gallbladder is surgically absent. No intrahepatic or extrahepatic biliary dilation. Pancreas: No focal mass lesion. No dilatation of the main duct. No intraparenchymal cyst. No peripancreatic edema. Spleen: No splenomegaly. No focal mass lesion. Adrenals/Urinary Tract: No adrenal nodule or mass. Right kidney unremarkable. 2.5 cm simple  cyst noted interpolar left kidney there is mild fullness of the left intrarenal collecting system with prominent extrarenal pelvis. No associated hydroureter. The urinary bladder appears normal for the degree of distention. Stomach/Bowel: Tiny hiatal hernia. Stomach otherwise unremarkable. Duodenum is normally positioned as is the ligament of Treitz. Duodenal diverticulum noted. No small bowel wall thickening. No small bowel dilatation. The terminal ileum is normal. The appendix is normal. Diverticuli are seen scattered along the entire length of the colon without CT findings of diverticulitis. Under distention in the right colon likely accounts for the appearance of wall thickening. There is no pericolonic edema or inflammation. Vascular/Lymphatic: There is abdominal aortic atherosclerosis without aneurysm. There is no  gastrohepatic or hepatoduodenal ligament lymphadenopathy. No retroperitoneal or mesenteric lymphadenopathy. No pelvic sidewall lymphadenopathy. Reproductive: The uterus is surgically absent. There is no adnexal mass. Other: No intraperitoneal free fluid. Musculoskeletal: No worrisome lytic or sclerotic osseous abnormality. IMPRESSION: 1. No acute findings in the abdomen or pelvis. Specifically, no findings to explain the patient's history of lower abdominal pain with nausea and vomiting. 2. Diffuse colonic diverticulosis without diverticulitis. Suggestion of wall thickening in the right colon felt to be related to underdistention as no associated pericolonic edema or inflammation evident. 3. Hepatic steatosis. 4. Tiny hiatal hernia. 5. Aortic Atherosclerosis (ICD10-I70.0). Electronically Signed   By: Misty Stanley M.D.   On: 04/04/2020 06:19    ____________________________________________   PROCEDURES  Procedure(s) performed (including Critical Care):  Procedures   ____________________________________________   INITIAL IMPRESSION / ASSESSMENT AND PLAN / ED COURSE  As part of my  medical decision making, I reviewed the following data within the [place] notes reviewed and incorporated, Labs reviewed, Old chart reviewed, Radiograph reviewed and Notes from prior ED visits     VIRGA HALTIWANGER was evaluated in Emergency Department on 04/04/2020 for the symptoms described in the history of present illness. She was evaluated in the context of the global COVID-19 pandemic, which necessitated consideration that the patient might be at risk for infection with the SARS-CoV-2 virus that causes COVID-19. Institutional protocols and algorithms that pertain to the evaluation of patients at risk for COVID-19 are in a state of rapid change based on information released by regulatory bodies including the CDC and federal and state organizations. These policies and algorithms were followed during the patient's care in the ED.    59 year old female presenting with lower abdominal pain. Differential diagnosis includes, but is not limited to, ovarian cyst, ovarian torsion, acute appendicitis, diverticulitis, urinary tract infection/pyelonephritis, endometriosis, bowel obstruction, colitis, renal colic, gastroenteritis, hernia, fibroids, endometriosis, etc.  Laboratory results notable for mild leukocytosis.  Awaiting urine specimen.  Will obtain CT abdomen/pelvis to evaluate for diverticulitis.  Initiate IV hydration, IV Morphine for pain, IV Zofran for nausea.  Will reassess.  Clinical Course as of Apr 04 748  Mon Apr 04, 2020  4259 Patient feeling better.  Awaiting UA.  Updated her on CT imaging result.  Will discharge home with prescriptions for Lactulose and Bentyl to use as needed.  May need antibiotic if she has a UTI.  Care transferred to Dr. Tamala Julian at change of shift.   [JS]    Clinical Course User Index [JS] Paulette Blanch, MD     ____________________________________________   FINAL CLINICAL IMPRESSION(S) / ED DIAGNOSES  Final diagnoses:  Lower abdominal pain   Constipation, unspecified constipation type     ED Discharge Orders         Ordered    dicyclomine (BENTYL) 20 MG tablet  Every 6 hours        04/04/20 0658    lactulose ([place]) 10 GM/15ML solution  Daily PRN        04/04/20 5638           Note:  This document was prepared using Dragon voice recognition software and may include unintentional dictation errors.   Paulette Blanch, MD 04/04/20 [phone]

## 2020-04-04 NOTE — Discharge Instructions (Signed)
1.  You may take Lactulose as needed for bowel movements. 2.  You may take Bentyl as needed for abdominal discomfort. 3.  Return to the ER for worsening symptoms, persistent vomiting, difficulty breathing or other concerns.

## 2020-04-29 ENCOUNTER — Encounter: Payer: Self-pay | Admitting: Family Medicine

## 2020-05-01 ENCOUNTER — Telehealth: Payer: Self-pay | Admitting: Family Medicine

## 2020-05-01 DIAGNOSIS — E559 Vitamin D deficiency, unspecified: Secondary | ICD-10-CM

## 2020-05-01 DIAGNOSIS — E785 Hyperlipidemia, unspecified: Secondary | ICD-10-CM

## 2020-05-01 DIAGNOSIS — R7309 Other abnormal glucose: Secondary | ICD-10-CM

## 2020-05-01 DIAGNOSIS — I10 Essential (primary) hypertension: Secondary | ICD-10-CM

## 2020-05-01 MED ORDER — FLUCONAZOLE 150 MG PO TABS
150.0000 mg | ORAL_TABLET | Freq: Once | ORAL | 0 refills | Status: AC
Start: 2020-05-01 — End: 2020-05-01

## 2020-05-01 NOTE — Telephone Encounter (Signed)
-----   Message from Ellamae Sia sent at 04/19/2020 12:20 PM EDT ----- Regarding: Lab orders for Monday. 9.27.21 Patient is scheduled for CPX labs, please order future labs, Thanks , Karna Christmas

## 2020-05-02 ENCOUNTER — Other Ambulatory Visit: Payer: Self-pay

## 2020-05-02 ENCOUNTER — Other Ambulatory Visit (INDEPENDENT_AMBULATORY_CARE_PROVIDER_SITE_OTHER): Payer: Managed Care, Other (non HMO)

## 2020-05-02 DIAGNOSIS — I10 Essential (primary) hypertension: Secondary | ICD-10-CM | POA: Diagnosis not present

## 2020-05-02 DIAGNOSIS — E559 Vitamin D deficiency, unspecified: Secondary | ICD-10-CM | POA: Diagnosis not present

## 2020-05-02 DIAGNOSIS — R7309 Other abnormal glucose: Secondary | ICD-10-CM | POA: Diagnosis not present

## 2020-05-02 DIAGNOSIS — E785 Hyperlipidemia, unspecified: Secondary | ICD-10-CM | POA: Diagnosis not present

## 2020-05-02 LAB — CBC WITH DIFFERENTIAL/PLATELET
Basophils Absolute: 0.1 10*3/uL (ref 0.0–0.1)
Basophils Relative: 0.9 % (ref 0.0–3.0)
Eosinophils Absolute: 0.2 10*3/uL (ref 0.0–0.7)
Eosinophils Relative: 3.1 % (ref 0.0–5.0)
HCT: 42.8 % (ref 36.0–46.0)
Hemoglobin: 14.1 g/dL (ref 12.0–15.0)
Lymphocytes Relative: 24 % (ref 12.0–46.0)
Lymphs Abs: 1.8 10*3/uL (ref 0.7–4.0)
MCHC: 32.9 g/dL (ref 30.0–36.0)
MCV: 83 fl (ref 78.0–100.0)
Monocytes Absolute: 0.6 10*3/uL (ref 0.1–1.0)
Monocytes Relative: 7.8 % (ref 3.0–12.0)
Neutro Abs: 4.8 10*3/uL (ref 1.4–7.7)
Neutrophils Relative %: 64.2 % (ref 43.0–77.0)
Platelets: 141 10*3/uL — ABNORMAL LOW (ref 150.0–400.0)
RBC: 5.16 Mil/uL — ABNORMAL HIGH (ref 3.87–5.11)
RDW: 14.3 % (ref 11.5–15.5)
WBC: 7.4 10*3/uL (ref 4.0–10.5)

## 2020-05-02 LAB — COMPREHENSIVE METABOLIC PANEL
ALT: 20 U/L (ref 0–35)
AST: 13 U/L (ref 0–37)
Albumin: 4.1 g/dL (ref 3.5–5.2)
Alkaline Phosphatase: 74 U/L (ref 39–117)
BUN: 13 mg/dL (ref 6–23)
CO2: 27 mEq/L (ref 19–32)
Calcium: 9.2 mg/dL (ref 8.4–10.5)
Chloride: 104 mEq/L (ref 96–112)
Creatinine, Ser: 0.82 mg/dL (ref 0.40–1.20)
GFR: 71.09 mL/min (ref >60.00)
Glucose, Bld: 116 mg/dL — ABNORMAL HIGH (ref 70–99)
Potassium: 3.8 mEq/L (ref 3.5–5.1)
Sodium: 141 mEq/L (ref 135–145)
Total Bilirubin: 0.5 mg/dL (ref 0.2–1.2)
Total Protein: 6.4 g/dL (ref 6.0–8.3)

## 2020-05-02 LAB — LIPID PANEL
Cholesterol: 211 mg/dL — ABNORMAL HIGH (ref 0–200)
HDL: 54.8 mg/dL (ref >39.00)
LDL Cholesterol: 132 mg/dL — ABNORMAL HIGH (ref 0–99)
NonHDL: 156.21
Total CHOL/HDL Ratio: 4
Triglycerides: 123 mg/dL (ref 0.0–149.0)
VLDL: 24.6 mg/dL (ref 0.0–40.0)

## 2020-05-02 LAB — TSH: TSH: 3.38 u[IU]/mL (ref 0.35–4.50)

## 2020-05-02 LAB — HEMOGLOBIN A1C: Hgb A1c MFr Bld: 5.9 % (ref 4.6–6.5)

## 2020-05-02 LAB — VITAMIN D 25 HYDROXY (VIT D DEFICIENCY, FRACTURES): VITD: 39.83 ng/mL (ref 30.00–100.00)

## 2020-05-06 ENCOUNTER — Other Ambulatory Visit: Payer: Self-pay

## 2020-05-06 ENCOUNTER — Encounter: Payer: Self-pay | Admitting: Family Medicine

## 2020-05-06 ENCOUNTER — Ambulatory Visit (INDEPENDENT_AMBULATORY_CARE_PROVIDER_SITE_OTHER): Payer: Managed Care, Other (non HMO) | Admitting: Family Medicine

## 2020-05-06 VITALS — BP 130/70 | HR 72 | Temp 96.9°F | Ht 64.0 in | Wt 202.4 lb

## 2020-05-06 DIAGNOSIS — F411 Generalized anxiety disorder: Secondary | ICD-10-CM

## 2020-05-06 DIAGNOSIS — E559 Vitamin D deficiency, unspecified: Secondary | ICD-10-CM | POA: Diagnosis not present

## 2020-05-06 DIAGNOSIS — Z0000 Encounter for general adult medical examination without abnormal findings: Secondary | ICD-10-CM

## 2020-05-06 DIAGNOSIS — I10 Essential (primary) hypertension: Secondary | ICD-10-CM

## 2020-05-06 DIAGNOSIS — R7309 Other abnormal glucose: Secondary | ICD-10-CM

## 2020-05-06 DIAGNOSIS — Z1211 Encounter for screening for malignant neoplasm of colon: Secondary | ICD-10-CM | POA: Insufficient documentation

## 2020-05-06 DIAGNOSIS — Z853 Personal history of malignant neoplasm of breast: Secondary | ICD-10-CM

## 2020-05-06 DIAGNOSIS — E785 Hyperlipidemia, unspecified: Secondary | ICD-10-CM

## 2020-05-06 MED ORDER — AMLODIPINE BESYLATE 2.5 MG PO TABS: 2.5000 mg | ORAL_TABLET | Freq: Every day | ORAL | 3 refills | Status: DC

## 2020-05-06 MED ORDER — SERTRALINE HCL 25 MG PO TABS: 25.0000 mg | ORAL_TABLET | Freq: Every day | ORAL | 3 refills | Status: DC

## 2020-05-06 NOTE — Progress Notes (Signed)
Subjective:    Patient ID: Makayla [name], female    DOB: April 03, 1961, 59 y.o.   MRN: [id number]  This visit occurred during the SARS-CoV-2 public health emergency.  Safety protocols were in place, including screening questions prior to the visit, additional usage of staff PPE, and extensive cleaning of exam room while observing appropriate contact time as indicated for disinfecting solutions.    HPI Here for health maintenance exam and to review chronic medical problems    Wt Readings from Last 3 Encounters:  05/06/20 202 lb 7 oz (91.8 kg)  04/03/20 202 lb (91.6 kg)  03/14/20 203 lb (92.1 kg)   34.75 kg/m  Wants to start walking again  Will be much easier when she retires  Too busy to take a walk at lunch right now   Flu shot - getting at work 10/20  Td 9/18  covid vaccinated in January  Zoster status -not interested in shingrix   Is retiring in 4 weeks   Colonoscopy 9/11 Wants referral   Mammogram 11/20 -is scheduled  Self breast exam -no lumps (has had fleeting breast pains her whole life) - worse after surgery in L breast  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 Mother had breast cancer at 58 and uterine cancer  Maunt with breast cancer    Used to see surgeon after mammogram-they signed up   HTN bp is stable today  No cp or palpitations or headaches or edema  No side effects to medicines  BP Readings from Last 3 Encounters:  05/06/20 130/70  04/04/20 128/84  03/14/20 134/86     Elevated glucose level in the past  Lab Results  Component Value Date   HGBA1C 5.9 05/02/2020  eating well   Vit D dev Level 39.8  Cholesterol  Lab Results  Component Value Date   CHOL 211 (H) 05/02/2020   CHOL 193 04/16/2019   CHOL 181 01/09/2018   Lab Results  Component Value Date   HDL 54.80 05/02/2020   HDL 51.40 04/16/2019   HDL 52.40 01/09/2018   Lab Results  Component Value Date   LDLCALC 132 (H) 05/02/2020   LDLCALC 124 (H) 04/16/2019    LDLCALC 111 (H) 01/09/2018   Lab Results  Component Value Date   TRIG 123.0 05/02/2020   TRIG 86.0 04/16/2019   TRIG 90.0 01/09/2018   Lab Results  Component Value Date   CHOLHDL 4 05/02/2020   CHOLHDL 4 04/16/2019   CHOLHDL 3 01/09/2018   No results found for: LDLDIRECT  LDLis up  Eating well  No changes  Was at the beach right before labs-did not eat as well as usual  No fast food  Lab Results  Component Value Date   CREATININE 0.82 05/02/2020   BUN 13 05/02/2020   NA 141 05/02/2020   K 3.8 05/02/2020   CL 104 05/02/2020   CO2 27 05/02/2020   Lab Results  Component Value Date   ALT 20 05/02/2020   AST 13 05/02/2020   ALKPHOS 74 05/02/2020   BILITOT 0.5 05/02/2020    Lab Results  Component Value Date   WBC 7.4 05/02/2020   HGB 14.1 05/02/2020   HCT 42.8 05/02/2020   MCV 83.0 05/02/2020   PLT 141.0 (L) 05/02/2020   Stable platelet ct   Lab Results  Component Value Date   TSH 3.38 05/02/2020     Patient Active Problem List   Diagnosis Date Noted  . Colon cancer screening 05/06/2020  .  Insomnia, psychophysiological 03/14/2020  . 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 Retro-orbital pain of left eye 03/14/2020  . Left temporal headache 03/14/2020  . Headache 02/25/2020  . Pain head 02/25/2020  . Itching 01/08/2020  . Pulsatile tinnitus of right ear 09/30/2019  . Immunization reaction 09/08/2019  . Tachycardia 10/03/2018  . Grief reaction 10/03/2018  . Elevated glucose level 01/16/2018  . Generalized anxiety disorder 01/23/2017  . Chest pain 11/02/2016  . Left knee pain 09/13/2015  . Vitamin D deficiency 05/20/2015  . Obesity 05/20/2015  . 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 Routine general medical examination at a health care facility 10/16/2011  . Hyperlipidemia LDL goal <130 12/04/2007  . GERD 12/04/2007  . Essential hypertension 12/04/2007   Past Medical History:  Diagnosis Date  . Bursitis,  trochanteric    bi/lat   . Dental crowns present    x 4  . GERD (gastroesophageal reflux disease)   . Hyperlipidemia    no current med.  . Hypertension    white coat syndrome,  . Mass of breast, left 01/2012  . Obesity    Past Surgical History:  Procedure Laterality Date  . ABDOMINAL HYSTERECTOMY     partial  . BREAST EXCISIONAL BIOPSY Left 2013  . BREAST SURGERY  01/10/12   lumpectomy - left  . CHOLECYSTECTOMY N/A 02/10/2016   Procedure: LAPAROSCOPIC CHOLECYSTECTOMY WITH INTRAOPERATIVE CHOLANGIOGRAM;  Surgeon: Autumn Messing III, MD;  Location: [facility];  Service: General;  Laterality: N/A;  . RECTOCELE REPAIR  06/14/2011   Procedure: POSTERIOR REPAIR (RECTOCELE);  Surgeon: Dutch Gray, MD;  Location: [facility];  Service: Urology;  Laterality: N/A;  cystoscopy, SPARC sling  . ROBOTIC ASSISTED LAPAROSCOPIC SACROCOLPOPEXY  06/14/2011   Procedure: ROBOTIC ASSISTED LAPAROSCOPIC SACROCOLPOPEXY;  Surgeon: Dutch Gray, MD;  Location: [facility];  Service: Urology;  Laterality: N/A;  . TUBAL LIGATION     Social History   Tobacco Use  . Smoking status: Never Smoker  . Smokeless tobacco: Never Used  Substance Use Topics  . Alcohol use: No    Alcohol/week: 0.0 standard drinks  . Drug use: No   Family History  Problem Relation Age of Onset  . Hypertension Mother   . Hyperlipidemia Mother   . Diabetes Mother   . Breast cancer Mother 12  . Uterine cancer Mother   . GER disease Father   . Depression Sister   . Heart disease Brother   . Heart attack Maternal Grandmother   . Hypertension Maternal Grandmother    Allergies  Allergen Reactions  . Buspar [Buspirone]     Made her more anxious   Current Outpatient Medications on File Prior to Visit  Medication Sig Dispense Refill  . ALPRAZolam (XANAX) 0.5 MG tablet Take 1 tablet (0.5 mg total) by mouth 2 (two) times daily as needed for anxiety. Caution of sedation 15 tablet 0  . dicyclomine (BENTYL) 20 MG tablet Take 1 tablet (20 mg total) by mouth every  6 (six) hours. 20 tablet 0  . esomeprazole (NEXIUM) 20 MG capsule Take 20 mg by mouth daily at 12 noon.    . hydrOXYzine (ATARAX/VISTARIL) 10 MG tablet Take 1 tablet (10 mg total) by mouth 3 (three) times daily as needed (and to help sleep). Caution of sedation 30 tablet 1  . hydrOXYzine (ATARAX/VISTARIL) 25 MG tablet Take 1 tablet (25 mg total) by mouth at bedtime. 30 tablet 3  . ibuprofen (ADVIL) 800 MG tablet TAKE 1 TABLET BY  MOUTH EVERY 8 HOURS AS NEEDED FOR PAIN 90 tablet 1  . lactulose (CHRONULAC) 10 GM/15ML solution Take 30 mLs (20 g total) by mouth daily as needed for mild constipation. 120 mL 0  . methocarbamol (ROBAXIN) 500 MG tablet Take 1 tablet (500 mg total) by mouth every 8 (eight) hours as needed for muscle spasms. As needed for headache, caution of sedation 30 tablet 1  . Vitamin D, Cholecalciferol, 25 MCG (1000 UT) TABS Take 5,000 Units by mouth daily.    . fluticasone (FLONASE) 50 MCG/ACT nasal spray Place 2 sprays into both nostrils daily. (Patient not taking: Reported on 05/06/2020) 16 g 6   No current facility-administered medications on file prior to visit.    Review of Systems  Constitutional: Negative for activity change, appetite change, fatigue, fever and unexpected weight change.  HENT: Negative for congestion, ear pain, rhinorrhea, sinus pressure and sore throat.   Eyes: Negative for pain, redness and visual disturbance.  Respiratory: Negative for cough, shortness of breath and wheezing.   Cardiovascular: Negative for chest pain and palpitations.  Gastrointestinal: Negative for abdominal pain, blood in stool, constipation and diarrhea.  Endocrine: Negative for polydipsia and polyuria.  Genitourinary: Negative for dysuria, frequency and urgency.  Musculoskeletal: Negative for arthralgias, back pain and myalgias.  Skin: Negative for pallor and rash.  Allergic/Immunologic: Negative for environmental allergies.  Neurological: Negative for dizziness, syncope and  headaches.  Hematological: Negative for adenopathy. Does not bruise/bleed easily.  Psychiatric/Behavioral: Negative for decreased concentration and dysphoric mood. The patient is not nervous/anxious.        Objective:   Physical Exam Constitutional:      General: She is not in acute distress.    Appearance: Normal appearance. She is well-developed. She is obese. She is not ill-appearing or diaphoretic.  HENT:     Head: Normocephalic and atraumatic.     Right Ear: Tympanic membrane, ear canal and external ear normal.     Left Ear: Tympanic membrane, ear canal and external ear normal.     Nose: Nose normal. No congestion.     Mouth/Throat:     Mouth: Mucous membranes are moist.     Pharynx: Oropharynx is clear. No posterior oropharyngeal erythema.  Eyes:     General: No scleral icterus.    Extraocular Movements: Extraocular movements intact.     Conjunctiva/sclera: Conjunctivae normal.     Pupils: Pupils are equal, round, and reactive to light.  Neck:     Thyroid: No thyromegaly.     Vascular: No carotid bruit or JVD.  Cardiovascular:     Rate and Rhythm: Normal rate and regular rhythm.     Pulses: Normal pulses.     Heart sounds: Normal heart sounds. No gallop.   Pulmonary:     Effort: Pulmonary effort is normal. No respiratory distress.     Breath sounds: Normal breath sounds. No wheezing.     Comments: Good air exch Chest:     Chest wall: No tenderness.  Abdominal:     General: Bowel sounds are normal. There is no distension or abdominal bruit.     Palpations: Abdomen is soft. There is no mass.     Tenderness: There is no abdominal tenderness.     Hernia: No hernia is present.  Genitourinary:    Comments: Breast exam: No mass, nodules, thickening, tenderness, bulging, retraction, inflamation, nipple discharge or skin changes noted.  No axillary or clavicular LA.     Baseline surgical changes to L breast /  no tenderness Musculoskeletal:        General: No tenderness.  Normal range of motion.     Cervical back: Normal range of motion and neck supple. No rigidity. No muscular tenderness.     Right lower leg: No edema.     Left lower leg: No edema.     Comments: No kyphosis   Lymphadenopathy:     Cervical: No cervical adenopathy.  Skin:    General: Skin is warm and dry.     Coloration: Skin is not pale.     Findings: No erythema or rash.     Comments: Few sks and lentigines  Peeling from sunburn on L shoulder  Fair complexion  Neurological:     Mental Status: She is alert. Mental status is at baseline.     Cranial Nerves: No cranial nerve deficit.     Motor: No abnormal muscle tone.     Coordination: Coordination normal.     Gait: Gait normal.     Deep Tendon Reflexes: Reflexes are normal and symmetric. Reflexes normal.  Psychiatric:        Mood and Affect: Mood normal.        Cognition and Memory: Cognition and memory normal.     Comments: Mood is good today           Assessment & Plan:   Problem List Items Addressed This Visit      Cardiovascular and Mediastinum   Essential hypertension    bp in fair control at this time  BP Readings from Last 1 Encounters:  05/06/20 130/70   No changes needed Most recent labs reviewed  Disc lifstyle change with low sodium diet and exercise        Relevant Medications   amLODipine (NORVASC) 2.5 MG tablet     Other   Hyperlipidemia LDL goal <130    Disc goals for lipids and reasons to control them Rev last labs with pt Rev low sat fat diet in detail Ate poorly on vacation the week before labs  Will work on diet      Relevant Medications   amLODipine (NORVASC) 2.5 MG tablet   Routine general medical examination at a health care facility - Primary    Reviewed health habits including diet and exercise and skin cancer prevention Reviewed appropriate screening tests for age  Also reviewed health mt list, fam hx and immunization status , as well as social and family history   See HPI Labs  reviewed  Colonoscopy ordered Flu shot planned at work  Declines shingrix for now Is covid immunized Mammogram planned  Taking vit D for bone health  Getting ready to retire - will have some time for better self care then  Would like to loose weight      History of malignant phylloides tumor of breast    occ pain in L breast where surgical procedure was Nl exam  Mammogram is already ordered      Vitamin D deficiency    Vitamin D level is therapeutic with current supplementation Disc importance of this to bone and overall health Level 39.8      Generalized anxiety disorder    Doing better with low dose sertraline  Encouraged self care      Relevant Medications   sertraline (ZOLOFT) 25 MG tablet   Elevated glucose level    Lab Results  Component Value Date   HGBA1C 5.9 05/02/2020   disc imp of low glycemic diet and wt  loss to prevent DM2  Pt is eating well and plans to start exercise in a month when she retires      Colon cancer screening    Ref for 10 y recall colonoscopy       Relevant Orders   Ambulatory referral to Gastroenterology

## 2020-05-06 NOTE — Assessment & Plan Note (Signed)
Doing better with low dose sertraline  Encouraged self care

## 2020-05-06 NOTE — Assessment & Plan Note (Signed)
Disc goals for lipids and reasons to control them Rev last labs with pt Rev low sat fat diet in detail Ate poorly on vacation the week before labs  Will work on diet

## 2020-05-06 NOTE — Assessment & Plan Note (Signed)
Lab Results  Component Value Date   HGBA1C 5.9 05/02/2020   disc imp of low glycemic diet and wt loss to prevent DM2  Pt is eating well and plans to start exercise in a month when she retires

## 2020-05-06 NOTE — Assessment & Plan Note (Signed)
occ pain in L breast where surgical procedure was Nl exam  Mammogram is already ordered

## 2020-05-06 NOTE — Assessment & Plan Note (Signed)
Ref for 10 y recall colonoscopy

## 2020-05-06 NOTE — Assessment & Plan Note (Signed)
Vitamin D level is therapeutic with current supplementation Disc importance of this to bone and overall health Level 39.8

## 2020-05-06 NOTE — Patient Instructions (Addendum)
The office will call to get colonoscopy set up   Get your mammogram   Avoid red meat/ fried foods/ egg yolks/ fatty breakfast meats/ butter, cheese and high fat dairy/ and shellfish    Take care of yourself  Start back walking

## 2020-05-06 NOTE — Assessment & Plan Note (Signed)
Reviewed health habits including diet and exercise and skin cancer prevention Reviewed appropriate screening tests for age  Also reviewed health mt list, fam hx and immunization status , as well as social and family history   See HPI Labs reviewed  Colonoscopy ordered Flu shot planned at work  Declines shingrix for now Is covid immunized Mammogram planned  Taking vit D for bone health  Getting ready to retire - will have some time for better self care then  Would like to loose weight

## 2020-05-06 NOTE — Assessment & Plan Note (Signed)
bp in fair control at this time  BP Readings from Last 1 Encounters:  05/06/20 130/70   No changes needed Most recent labs reviewed  Disc lifstyle change with low sodium diet and exercise

## 2020-05-16 ENCOUNTER — Encounter: Payer: Self-pay | Admitting: *Deleted

## 2020-05-20 ENCOUNTER — Other Ambulatory Visit: Payer: Self-pay | Admitting: Family Medicine

## 2020-05-20 DIAGNOSIS — Z1231 Encounter for screening mammogram for malignant neoplasm of breast: Secondary | ICD-10-CM

## 2020-06-13 ENCOUNTER — Ambulatory Visit (INDEPENDENT_AMBULATORY_CARE_PROVIDER_SITE_OTHER)
Admission: RE | Admit: 2020-06-13 | Discharge: 2020-06-13 | Disposition: A | Discharge status: Home / Self Care | Payer: Managed Care, Other (non HMO) | Source: Ambulatory Visit | Attending: Family Medicine | Admitting: Family Medicine

## 2020-06-13 ENCOUNTER — Ambulatory Visit: Payer: Managed Care, Other (non HMO) | Admitting: Family Medicine

## 2020-06-13 ENCOUNTER — Encounter: Payer: Self-pay | Admitting: Family Medicine

## 2020-06-13 ENCOUNTER — Other Ambulatory Visit: Payer: Self-pay

## 2020-06-13 VITALS — BP 124/78 | HR 82 | Temp 98.5°F | Ht 64.0 in | Wt 205.0 lb

## 2020-06-13 DIAGNOSIS — R0781 Pleurodynia: Secondary | ICD-10-CM | POA: Diagnosis not present

## 2020-06-13 DIAGNOSIS — R10812 Left upper quadrant abdominal tenderness: Secondary | ICD-10-CM | POA: Diagnosis not present

## 2020-06-13 NOTE — Assessment & Plan Note (Signed)
Unclear whether more soft tissue or rib  Possibly brought on by position change  Reviewed her CT abd and cxr from august (she had symptoms then also)-reassuring  Does have tiny HH but nexium controls her GI symptoms  Rib films ordered today  She has f/u with GI mid month also-will disc at that visit as well

## 2020-06-13 NOTE — Patient Instructions (Signed)
Xray of ribs today  We will get back to you with results   You can try heat when it is uncomfortable Take note it this occurs with specific actions or positions   Talk to the GI doctor about it when you follow up as well    If worse please call

## 2020-06-13 NOTE — Assessment & Plan Note (Signed)
Some tenderness in L anterolateral lower ribs  Can palpate a small rubbery area consistent with lipoma Also old dermatology scar in that area  Dg rib today  Rev last CXR and CT from august-reassuring  Will be mindful re: triggers (? Position change)  Also has upcoming f/u with GI  If more bothersome- adv use of heat as well

## 2020-06-13 NOTE — Progress Notes (Signed)
Subjective:    Patient ID: Makayla [name], female    DOB: April 10, 1961, 59 y.o.   MRN: [id number]  This visit occurred during the SARS-CoV-2 public health emergency.  Safety protocols were in place, including screening questions prior to the visit, additional usage of staff PPE, and extensive cleaning of exam room while observing appropriate contact time as indicated for disinfecting solutions.    HPI Pt presents with a tender area under L ribs / without trauma   Wt Readings from Last 3 Encounters:  06/13/20 205 lb (93 kg)  05/06/20 202 lb 7 oz (91.8 kg)  04/03/20 202 lb (91.6 kg)   35.19 kg/m  Area is tender right under her ribs on the L -painful    (since at least august- ER doctor noted it)  ? Of hernia or lipoma Has similar area on R -never hurt  Pain is very sharp when it hurts It moves up into the side of her ribs  Episodes of pain last a few minutes When she sits down/bends over it is uncomfortable (like something is there)  Also twisting/reaching  Not exertional at all  No rash   It is also tender to the touch  No pain to take a deep breath  No change with eating/drinking   occ she will have a burning sensation in epigastrium (fleeting)   She has been cleaning lately   Takes nexium daily 20 mg  Seldom gets heartburn   No nausea   Takes ibuprofen pretty rarely   Has mammogram on Friday   (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  EXAM: CT ABDOMEN AND PELVIS WITH CONTRAST  TECHNIQUE: Multidetector CT imaging of the abdomen and pelvis was performed using the standard protocol following bolus administration of intravenous contrast.  CONTRAST:  112mL OMNIPAQUE IOHEXOL 300 MG/ML  SOLN  COMPARISON:  04/16/2011  FINDINGS: Lower chest: Unremarkable.  Hepatobiliary: The liver shows diffusely decreased attenuation suggesting fat deposition. Small area of low attenuation in the anterior liver, adjacent to the falciform ligament, is in a characteristic location for focal fatty deposition. Fatty sparing noted in the caudate lobe. Gallbladder is surgically absent. No intrahepatic or extrahepatic biliary dilation.  Pancreas: No focal mass lesion. No dilatation of the main duct. No intraparenchymal cyst. No peripancreatic edema.  Spleen: No splenomegaly. No focal mass lesion.  Adrenals/Urinary Tract: No adrenal nodule or mass. Right kidney unremarkable. 2.5 cm simple cyst noted interpolar left kidney there is mild fullness of the left intrarenal collecting system with prominent extrarenal pelvis. No associated hydroureter. The urinary bladder appears normal for the degree of distention.  Stomach/Bowel: Tiny hiatal hernia. Stomach otherwise unremarkable. Duodenum is normally positioned as is the ligament of Treitz. Duodenal diverticulum noted. No small bowel wall thickening. No small bowel dilatation. The terminal ileum is normal. The appendix is normal. Diverticuli are seen scattered along the entire length of the colon without CT findings of diverticulitis. Under distention in the  right colon likely accounts for the appearance of wall thickening. There is no pericolonic edema or inflammation.  Vascular/Lymphatic: There is abdominal aortic atherosclerosis without aneurysm. There is no gastrohepatic or hepatoduodenal ligament lymphadenopathy. No retroperitoneal or  mesenteric lymphadenopathy. No pelvic sidewall lymphadenopathy.  Reproductive: The uterus is surgically absent. There is no adnexal mass.  Other: No intraperitoneal free fluid.  Musculoskeletal: No worrisome lytic or sclerotic osseous abnormality.  IMPRESSION: 1. No acute findings in the abdomen or pelvis. Specifically, no findings to explain the patient's history of lower abdominal pain with nausea and vomiting. 2. Diffuse colonic diverticulosis without diverticulitis. Suggestion of wall thickening in the right colon felt to be related to underdistention as no associated pericolonic edema or inflammation evident. 3. Hepatic steatosis. 4. Tiny hiatal hernia. 5. Aortic Atherosclerosis (ICD10-I70.0).   Electronically Signed   By: Misty Stanley M.D.   On: 04/04/2020 06:19      Last cxr from august DG Chest 2 View (Accession [id number]) [id number]) Imaging Date: 04/03/2020 Department: [facility] Released By: Hector Shade, RN (auto-released) Authorizing: Blake Divine, MD  Exam Status  Status  Final [99]  PACS Intelerad Image Link  Show images for DG Chest 2 View Study Result  Narrative & Impression  CLINICAL DATA:  Pt reports lower abd pain since Thursday and then today it radiated up to her mid chest. Pt reports some nausea as well and describes the pain as sharp, burning and like a big cramp  EXAM: CHEST - 2 VIEW  COMPARISON:  Chest radiograph 10/01/2018  FINDINGS: The cardiomediastinal contours are within normal limits. The lungs are clear. No pneumothorax or pleural effusion. No acute finding in the visualized skeleton. The  IMPRESSION: No active cardiopulmonary disease.   Electronically Signed   By: Audie Pinto M.D.   On: 04/03/2020 18:04    Patient Active Problem List   Diagnosis Date Noted  . Rib pain on left side 06/13/2020  . Abdominal tenderness, left upper quadrant  06/13/2020  . Colon cancer screening 05/06/2020  . Insomnia, psychophysiological 03/14/2020  . 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 Retro-orbital pain of left eye 03/14/2020  . Left temporal headache 03/14/2020  . Headache 02/25/2020  . Pain head 02/25/2020  . Itching 01/08/2020  . Pulsatile tinnitus of right ear 09/30/2019  . Immunization reaction 09/08/2019  . Tachycardia 10/03/2018  . Grief reaction 10/03/2018  . Elevated glucose level 01/16/2018  . Generalized anxiety disorder 01/23/2017  . Chest pain 11/02/2016  . Left knee pain 09/13/2015  . Vitamin D deficiency 05/20/2015  . Obesity 05/20/2015  . 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 Routine general medical examination at a health care facility 10/16/2011  . Hyperlipidemia LDL goal <130 12/04/2007  . GERD 12/04/2007  . Essential hypertension 12/04/2007   Past Medical History:  Diagnosis Date  . Bursitis, trochanteric    bi/lat   . Dental crowns present    x 4  . GERD (gastroesophageal reflux disease)   . Hyperlipidemia    no current med.  . Hypertension    white coat syndrome,  . Mass of breast, left 01/2012  . Obesity    Past Surgical History:  Procedure Laterality Date  . ABDOMINAL HYSTERECTOMY     partial  . BREAST EXCISIONAL BIOPSY Left 2013  . BREAST SURGERY  01/10/12   lumpectomy - left  . CHOLECYSTECTOMY N/A 02/10/2016   Procedure: LAPAROSCOPIC CHOLECYSTECTOMY WITH INTRAOPERATIVE CHOLANGIOGRAM;  Surgeon: Eddie Dibbles  Daiva Nakayama, MD;  Location: [facility];  Service: General;  Laterality: N/A;  . RECTOCELE REPAIR  06/14/2011   Procedure: POSTERIOR REPAIR (RECTOCELE);  Surgeon: Dutch Gray, MD;  Location: [facility];  Service: Urology;  Laterality: N/A;  cystoscopy, SPARC sling  . ROBOTIC ASSISTED LAPAROSCOPIC SACROCOLPOPEXY  06/14/2011   Procedure: ROBOTIC ASSISTED LAPAROSCOPIC SACROCOLPOPEXY;  Surgeon: Dutch Gray, MD;  Location: [facility];  Service: Urology;  Laterality: N/A;  . TUBAL  LIGATION     Social History   Tobacco Use  . Smoking status: Never Smoker  . Smokeless tobacco: Never Used  Substance Use Topics  . Alcohol use: No    Alcohol/week: 0.0 standard drinks  . Drug use: No   Family History  Problem Relation Age of Onset  . Hypertension Mother   . Hyperlipidemia Mother   . Diabetes Mother   . Breast cancer Mother 105  . Uterine cancer Mother   . GER disease Father   . Depression Sister   . Heart disease Brother   . Heart attack Maternal Grandmother   . Hypertension Maternal Grandmother    Allergies  Allergen Reactions  . Buspar [Buspirone]     Made her more anxious   Current Outpatient Medications on File Prior to Visit  Medication Sig Dispense Refill  . ALPRAZolam (XANAX) 0.5 MG tablet Take 1 tablet (0.5 mg total) by mouth 2 (two) times daily as needed for anxiety. Caution of sedation 15 tablet 0  . amLODipine (NORVASC) 2.5 MG tablet Take 1 tablet (2.5 mg total) by mouth daily. 90 tablet 3  . esomeprazole (NEXIUM) 20 MG capsule Take 20 mg by mouth daily at 12 noon.    . fluticasone (FLONASE) 50 MCG/ACT nasal spray Place 2 sprays into both nostrils daily. 16 g 6  . ibuprofen (ADVIL) 800 MG tablet TAKE 1 TABLET BY MOUTH EVERY 8 HOURS AS NEEDED FOR PAIN 90 tablet 1  . sertraline (ZOLOFT) 25 MG tablet Take 1 tablet (25 mg total) by mouth daily. 90 tablet 3  . Vitamin D, Cholecalciferol, 25 MCG (1000 UT) TABS Take 5,000 Units by mouth daily.    Marland Kitchen dicyclomine (BENTYL) 20 MG tablet Take 1 tablet (20 mg total) by mouth every 6 (six) hours. (Patient not taking: Reported on 06/13/2020) 20 tablet 0  . hydrOXYzine (ATARAX/VISTARIL) 10 MG tablet Take 1 tablet (10 mg total) by mouth 3 (three) times daily as needed (and to help sleep). Caution of sedation (Patient not taking: Reported on 06/13/2020) 30 tablet 1  . lactulose (CHRONULAC) 10 GM/15ML solution Take 30 mLs (20 g total) by mouth daily as needed for mild constipation. (Patient not taking: Reported on  06/13/2020) 120 mL 0  . methocarbamol (ROBAXIN) 500 MG tablet Take 1 tablet (500 mg total) by mouth every 8 (eight) hours as needed for muscle spasms. As needed for headache, caution of sedation (Patient not taking: Reported on 06/13/2020) 30 tablet 1   No current facility-administered medications on file prior to visit.    Review of Systems  Constitutional: Negative for activity change, appetite change, fatigue, fever and unexpected weight change.  HENT: Negative for congestion, ear pain, rhinorrhea, sinus pressure and sore throat.   Eyes: Negative for pain, redness and visual disturbance.  Respiratory: Negative for cough, shortness of breath and wheezing.   Cardiovascular: Negative for chest pain, palpitations and leg swelling.       Chest wall/rib pain on L anterior/lateral  Gastrointestinal: Negative for abdominal distention, abdominal pain, blood in stool, constipation,  diarrhea, nausea and vomiting.       Discomfort under and on L lower ribs  Endocrine: Negative for polydipsia and polyuria.  Genitourinary: Negative for dysuria, frequency and urgency.  Musculoskeletal: Negative for arthralgias, back pain and myalgias.  Skin: Negative for pallor and rash.  Allergic/Immunologic: Negative for environmental allergies.  Neurological: Negative for dizziness, syncope and headaches.  Hematological: Negative for adenopathy. Does not bruise/bleed easily.  Psychiatric/Behavioral: Negative for decreased concentration and dysphoric mood. The patient is not nervous/anxious.        Objective:   Physical Exam Constitutional:      General: She is not in acute distress.    Appearance: She is well-developed. She is obese. She is not ill-appearing or diaphoretic.  HENT:     Head: Normocephalic and atraumatic.  Eyes:     General: No scleral icterus.    Conjunctiva/sclera: Conjunctivae normal.     Pupils: Pupils are equal, round, and reactive to light.  Neck:     Thyroid: No thyromegaly.      Vascular: No carotid bruit or JVD.  Cardiovascular:     Rate and Rhythm: Normal rate and regular rhythm.     Heart sounds: Normal heart sounds. No gallop.   Pulmonary:     Effort: Pulmonary effort is normal. No respiratory distress.     Breath sounds: Normal breath sounds. No stridor. No wheezing, rhonchi or rales.     Comments: Some mild tenderness of L anterolateral lower ribs w/o crepitus or step off  No pain with deep breath    Chest:     Chest wall: Tenderness present.  Abdominal:     General: Bowel sounds are normal. There is no distension or abdominal bruit.     Palpations: Abdomen is soft. There is no mass.     Tenderness: There is no abdominal tenderness. There is no right CVA tenderness, left CVA tenderness, guarding or rebound.     Hernia: No hernia is present.     Comments: Very slight tenderness of abdomen just under anterior L ribs  There is a rubbery small area consistent with fat deposition /lipoma   Musculoskeletal:     Cervical back: Normal range of motion and neck supple. No tenderness.     Comments: No TS or cervical tenderness  No cva tenderness   Some L lower ant/lat rib tenderness  Nl rom of spine and neck   Lymphadenopathy:     Cervical: No cervical adenopathy.  Skin:    General: Skin is warm and dry.     Coloration: Skin is not jaundiced or pale.     Findings: No erythema or rash.  Neurological:     Mental Status: She is alert.     Motor: No weakness.     Coordination: Coordination normal.     Deep Tendon Reflexes: Reflexes are normal and symmetric.  Psychiatric:        Mood and Affect: Mood normal.     Comments: pleasant           Assessment & Plan:   Problem List Items Addressed This Visit      Other   Rib pain on left side - Primary    Some tenderness in L anterolateral lower ribs  Can palpate a small rubbery area consistent with lipoma Also old dermatology scar in that area  Dg rib today  Rev last CXR and CT from  august-reassuring  Will be mindful re: triggers (? Position change)  Also has upcoming  f/u with GI  If more bothersome- adv use of heat as well      Relevant Orders   DG Ribs Unilateral Left   Abdominal tenderness, left upper quadrant    Unclear whether more soft tissue or rib  Possibly brought on by position change  Reviewed her CT abd and cxr from august (she had symptoms then also)-reassuring  Does have tiny HH but nexium controls her GI symptoms  Rib films ordered today  She has f/u with GI mid month also-will disc at that visit as well

## 2020-06-14 NOTE — Progress Notes (Signed)
Chief Complaint  Patient presents with  . Follow-up    rm 2  . Headache     HISTORY OF PRESENT ILLNESS: Today 06/15/20  Makayla [name] is a 59 y.o. female here today for follow up for headaches and insomnia. Dr Dohmeier suggested increasing hydroxyzine to 25 mg in 03/2020. She had been prescribed sertraline by PCP as well but did not feel it helped with sleep. She reports that headaches have improved. She is sleeping better. She uses hydroxyzine as needed. She does have occasional sharp pains, usually around temple area but denies regular occurrence and symptoms are short lived. She also notes a pulsatile roaring in her right ear from time to time. BP normal. MRI showed mildly enlarged sella turcica. Optic nerve sheaths normal.   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 He mother is ill and requiring more care.    HISTORY (copied from Dr Edwena Felty note on 03/14/2020)  Makayla [name] a 59 year- old Caucasian female patient andseen here upon referralon 03/14/2020 from Dr/ Glori Bickers, Chiefconcernaccording to patient :  New onset headaches , 4-6 weeks ago she felt as if her muscles were all tense in neck and nape- but not in the temple, not initially. She was given a medication which she has not taken.  She has since felt some sharp , stinging pain in the left ear and left jaw and around the left eye.  Sometimes feels as if there is band around the head  .  I have the pleasure of seeing Makayla [name] today,a right -handed Caucasian female with a new headaches - more than one type.  She  has a past medical history of Bursitis, trochanteric, Dental crowns present, GERD (gastroesophageal reflux disease), Hyperlipidemia, Hypertension, Mass of breast, left (01/2012), and Obesity.  She feels a little lightheaded. BMI 35.  She had 2 migraines while she worked night shifts.    Familymedical /sleep history:two sisters with migraines.  She  lost her husband in 2018-08-28,  Died of the flu, pneumonia, ARDS for 28 days. .   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 She lives in a household alone. Family status is widowed , with  Grown daughter and 1 granddaughter.  The patient currentlyused to work in shifts( night/ rotating,) Pets are not present. Tobacco use; never . ETOH use ; none , Caffeine intake in form of Coffee( 1 cup in AM ) Soda( /) Tea (/ ) or energy drinks. Regular exercise in form of 30 minutes of walking.  She is reducing her caffeine as it gave her palpitations.   Sleep habits are as follows:The patient's dinner time is variable around 7 Pm,  The patient goes to bed at 10.30 PM and has trouble to sleep or continue to sleep . Insomnia since last 2022-08-28-   The preferred sleep position is left side , with the support of 1 pillow.  Dreams are reportedly rare.  5.15 AM is the usual rise time. The patient wakes up with an alarm, ost morning she is already awake. .  She reports not feeling refreshed or restored in AM, no morning headaches always residual fatigue. Naps are taken infrequently,    REVIEW OF SYSTEMS: Out of a complete 14 system review of symptoms, the patient complains only of the following symptoms, headaches, insomnia, pulsatile roaring of right ear and all other reviewed systems are negative.  ALLERGIES: Allergies  Allergen Reactions  . Buspar [Buspirone]     Made her more anxious     HOME MEDICATIONS: Outpatient Medications Prior to Visit  Medication Sig Dispense Refill  . ALPRAZolam (XANAX) 0.5 MG tablet Take 1 tablet (0.5 mg total) by mouth 2 (two) times daily as needed for anxiety. Caution of sedation 15 tablet 0  . amLODipine (NORVASC) 2.5 MG tablet Take 1 tablet (2.5 mg total) by mouth daily. 90 tablet 3  . esomeprazole (NEXIUM) 20 MG capsule Take 20 mg by mouth daily at 12 noon.    . fluticasone (FLONASE) 50 MCG/ACT nasal spray Place 2 sprays into both  nostrils daily. 16 g 6  . hydrOXYzine (ATARAX/VISTARIL) 10 MG tablet Take 1 tablet (10 mg total) by mouth 3 (three) times daily as needed (and to help sleep). Caution of sedation 30 tablet 1  . ibuprofen (ADVIL) 800 MG tablet TAKE 1 TABLET BY MOUTH EVERY 8 HOURS AS NEEDED FOR PAIN 90 tablet 1  . sertraline (ZOLOFT) 25 MG tablet Take 1 tablet (25 mg total) by mouth daily. 90 tablet 3  . Vitamin D, Cholecalciferol, 25 MCG (1000 UT) TABS Take 5,000 Units by mouth daily.    Marland Kitchen dicyclomine (BENTYL) 20 MG tablet Take 1 tablet (20 mg total) by mouth every 6 (six) hours. 20 tablet 0  . lactulose (CHRONULAC) 10 GM/15ML solution Take 30 mLs (20 g total) by mouth daily as needed for mild constipation. (Patient not taking: Reported on 06/13/2020) 120 mL 0  . methocarbamol (ROBAXIN) 500 MG tablet Take 1 tablet (500 mg total) by mouth every 8 (eight) hours as needed for muscle spasms. As needed for headache, caution of sedation (Patient not taking: Reported on 06/13/2020) 30 tablet 1   No facility-administered medications prior to visit.     PAST MEDICAL HISTORY: Past Medical History:  Diagnosis Date  . Bursitis, trochanteric    bi/lat   . Dental crowns present    x 4  . GERD (gastroesophageal reflux disease)   . Hyperlipidemia    no current med.  . Hypertension    white coat syndrome,  . Mass of breast, left 01/2012  . Obesity      PAST SURGICAL HISTORY: Past Surgical History:  Procedure Laterality Date  . ABDOMINAL HYSTERECTOMY     partial  . BREAST EXCISIONAL BIOPSY Left 2013  . BREAST SURGERY  01/10/12   lumpectomy - left  . CHOLECYSTECTOMY N/A 02/10/2016   Procedure: LAPAROSCOPIC CHOLECYSTECTOMY WITH INTRAOPERATIVE CHOLANGIOGRAM;  Surgeon: Autumn Messing III, MD;  Location: [facility];  Service: General;  Laterality: N/A;  . RECTOCELE REPAIR  06/14/2011   Procedure: POSTERIOR REPAIR (RECTOCELE);  Surgeon: Dutch Gray, MD;  Location: [facility];  Service: Urology;  Laterality: N/A;  cystoscopy, SPARC sling    . ROBOTIC ASSISTED LAPAROSCOPIC SACROCOLPOPEXY  06/14/2011   Procedure: ROBOTIC ASSISTED LAPAROSCOPIC SACROCOLPOPEXY;  Surgeon: Dutch Gray, MD;  Location: [facility];  Service: Urology;  Laterality: N/A;  . TUBAL LIGATION       FAMILY HISTORY: Family History  Problem Relation Age of Onset  . Hypertension Mother   . Hyperlipidemia Mother   . Diabetes Mother   . Breast cancer Mother 79  . Uterine cancer Mother   . GER disease Father   . Depression Sister   . Heart disease Brother   . Heart attack Maternal Grandmother   . Hypertension Maternal Grandmother      SOCIAL HISTORY: Social History   Socioeconomic History  .  Marital status: Widowed    Spouse name: Not on file  . Number of children: 1  . Years of education: Not on file  . Highest education level: Not on file  Occupational History  . Occupation: Therapist, sports  Tobacco Use  . Smoking status: Never Smoker  . Smokeless tobacco: Never Used  Substance and Sexual Activity  . Alcohol use: No    Alcohol/week: 0.0 standard drinks  . Drug use: No  . Sexual activity: Not on file    Comment: has used estradiol patch 0.025  in the past, has stopped  Other Topics Concern  . Not on file  Social History Narrative  . Not on file   Social Determinants of Health   Financial Resource Strain:   . Difficulty of Paying Living Expenses: Not on file  Food Insecurity:   . Worried About Charity fundraiser in the Last Year: Not on file  . Ran Out of Food in the Last Year: Not on file  Transportation Needs:   . Lack of Transportation (Medical): Not on file  . Lack of Transportation (Non-Medical): Not on file  Physical Activity:   . Days of Exercise per Week: Not on file  . Minutes of Exercise per Session: Not on file  Stress:   . Feeling of Stress : Not on file  Social Connections:   . Frequency of Communication with Friends and Family: Not on file  . Frequency of Social Gatherings with Friends and Family: Not on file  . Attends Religious  Services: Not on file  . Active Member of Clubs or Organizations: Not on file  . Attends Archivist Meetings: Not on file  . Marital Status: Not on file  Intimate Partner Violence:   . Fear of Current or Ex-Partner: Not on file  . Emotionally Abused: Not on file  . Physically Abused: Not on file  . Sexually Abused: Not on file      PHYSICAL EXAM  Vitals:   06/15/20 0744  BP: 119/75  Pulse: 98  Weight: 205 lb (93 kg)  Height: 5\' 3"  (1.6 m)   Body mass index is 36.31 kg/m.   Generalized: Well developed, in no acute distress   Neurological examination  Mentation: Alert oriented to time, place, history taking. Follows all commands speech and language fluent Cranial nerve II-XII: Pupils were equal round reactive to light. Extraocular movements were full, visual field were full on confrontational test. Facial sensation and strength were normal. Head turning and shoulder shrug  were normal and symmetric. Motor: The motor testing reveals 5 over 5 strength of all 4 extremities. Good symmetric motor tone is noted throughout.  Sensory: Sensory testing is intact to soft touch on all 4 extremities. No evidence of extinction is noted.  Coordination: Cerebellar testing reveals good finger-nose-finger and heel-to-shin bilaterally.  Gait and station: Gait is normal.     DIAGNOSTIC DATA (LABS, IMAGING, TESTING) - I reviewed patient records, labs, notes, testing and imaging myself where available.  Lab Results  Component Value Date   WBC 7.4 05/02/2020   HGB 14.1 05/02/2020   HCT 42.8 05/02/2020   MCV 83.0 05/02/2020   PLT 141.0 (L) 05/02/2020      Component Value Date/Time   NA 141 05/02/2020 0740   NA 141 06/14/2017 0807   K 3.8 05/02/2020 0740   CL 104 05/02/2020 0740   CO2 27 05/02/2020 0740   GLUCOSE 116 (H) 05/02/2020 0740   BUN 13 05/02/2020 0740  BUN 8 06/14/2017 0807   CREATININE 0.82 05/02/2020 0740   CREATININE 0.97 10/17/2011 0847   CALCIUM 9.2  05/02/2020 0740   PROT 6.4 05/02/2020 0740   PROT 6.6 06/14/2017 0807   ALBUMIN 4.1 05/02/2020 0740   ALBUMIN 4.1 06/14/2017 0807   AST 13 05/02/2020 0740   ALT 20 05/02/2020 0740   ALKPHOS 74 05/02/2020 0740   BILITOT 0.5 05/02/2020 0740   BILITOT 0.4 06/14/2017 0807   GFRNONAA >60 04/03/2020 1742   GFRAA >60 04/03/2020 1742   Lab Results  Component Value Date   CHOL 211 (H) 05/02/2020   HDL 54.80 05/02/2020   LDLCALC 132 (H) 05/02/2020   TRIG 123.0 05/02/2020   CHOLHDL 4 05/02/2020   Lab Results  Component Value Date   HGBA1C 5.9 05/02/2020   No results found for: VITAMINB12 Lab Results  Component Value Date   TSH 3.38 05/02/2020      ASSESSMENT AND PLAN  59 y.o. year old female  has a past medical history of Bursitis, trochanteric, Dental crowns present, GERD (gastroesophageal reflux disease), Hyperlipidemia, Hypertension, Mass of breast, left (01/2012), and Obesity. here with   Insomnia, psychophysiological  Nonintractable episodic headache, unspecified headache type  Pulsatile tinnitus of right ear  [place] reports that she has had some improvement in headaches and insomnia since last being seen in August.  She continues sertraline daily as prescribed by primary care.  She uses hydroxyzine as needed for insomnia.  She feels that she is doing well, today.  She does continue to have some mild pulsatile roaring of the right ear.  We have discussed MRI results.  I have encouraged her to focus on healthy lifestyle habits with well-balanced diet, regular exercise and adequate hydration.  She will follow-up regularly with her ophthalmologist.  She denies vision changes.  We will monitor symptoms closely.  She will follow-up with [place] as needed for any new or worsening symptoms.  She verbalizes understanding and agreement with this plan.   I spent 20 minutes of face-to-face and non-face-to-face time with patient.  This included previsit chart review, lab review, study review,  order entry, electronic health record documentation, patient education.    Debbora Presto, MSN, FNP-C 06/15/2020, 8:25 AM  [facility] [address] [place], [place] [address] [phone]

## 2020-06-14 NOTE — Patient Instructions (Addendum)
Below is our plan:  We will continue current plan. Continue sertraline daily and hydroxyzine as needed.   Please make sure you are staying well hydrated. I recommend 50-60 ounces daily. Well balanced diet and regular exercise encouraged.    Please continue follow up with care team as directed.   Follow up as needed   You may receive a survey regarding today's visit. I encourage you to leave honest feed back as I do use this information to improve patient care. Thank you for seeing me today!      Insomnia Insomnia is a sleep disorder that makes it difficult to fall asleep or stay asleep. Insomnia can cause fatigue, low energy, difficulty concentrating, mood swings, and poor performance at work or school. There are three different ways to classify insomnia:  Difficulty falling asleep.  Difficulty staying asleep.  Waking up too early in the morning. Any type of insomnia can be long-term (chronic) or short-term (acute). Both are common. Short-term insomnia usually lasts for three months or less. Chronic insomnia occurs at least three times a week for longer than three months. What are the causes? Insomnia may be caused by another condition, situation, or substance, such as:  Anxiety.  Certain medicines.  Gastroesophageal reflux disease (GERD) or other gastrointestinal conditions.  Asthma or other breathing conditions.  Restless legs syndrome, sleep apnea, or other sleep disorders.  Chronic pain.  Menopause.  Stroke.  Abuse of alcohol, tobacco, or illegal drugs.  Mental health conditions, such as depression.  Caffeine.  Neurological disorders, such as Alzheimer's disease.  An overactive thyroid (hyperthyroidism). Sometimes, the cause of insomnia may not be known. What increases the risk? Risk factors for insomnia include:  Gender. Women are affected more often than men.  Age. Insomnia is more common as you get older.  Stress.  Lack of exercise.  Irregular  work schedule or working night shifts.  Traveling between different time zones.  Certain medical and mental health conditions. What are the signs or symptoms? If you have insomnia, the main symptom is having trouble falling asleep or having trouble staying asleep. This may lead to other symptoms, such as:  Feeling fatigued or having low energy.  Feeling nervous about going to sleep.  Not feeling rested in the morning.  Having trouble concentrating.  Feeling irritable, anxious, or depressed. How is this diagnosed? This condition may be diagnosed based on:  Your symptoms and medical history. Your health care provider may ask about: ? Your sleep habits. ? Any medical conditions you have. ? Your mental health.  A physical exam. How is this treated? Treatment for insomnia depends on the cause. Treatment may focus on treating an underlying condition that is causing insomnia. Treatment may also include:  Medicines to help you sleep.  Counseling or therapy.  Lifestyle adjustments to help you sleep better. Follow these instructions at home: Eating and drinking   Limit or avoid alcohol, caffeinated beverages, and cigarettes, especially close to bedtime. These can disrupt your sleep.  Do not eat a large meal or eat spicy foods right before bedtime. This can lead to digestive discomfort that can make it hard for you to sleep. Sleep habits   Keep a sleep diary to help you and your health care provider figure out what could be causing your insomnia. Write down: ? When you sleep. ? When you wake up during the night. ? How well you sleep. ? How rested you feel the next day. ? Any side effects of medicines you  are taking. ? What you eat and drink.  Make your bedroom a dark, comfortable place where it is easy to fall asleep. ? Put up shades or blackout curtains to block light from outside. ? Use a white noise machine to block noise. ? Keep the temperature cool.  Limit screen  use before bedtime. This includes: ? Watching TV. ? Using your smartphone, tablet, or computer.  Stick to a routine that includes going to bed and waking up at the same times every day and night. This can help you fall asleep faster. Consider making a quiet activity, such as reading, part of your nighttime routine.  Try to avoid taking naps during the day so that you sleep better at night.  Get out of bed if you are still awake after 15 minutes of trying to sleep. Keep the lights down, but try reading or doing a quiet activity. When you feel sleepy, go back to bed. General instructions  Take over-the-counter and prescription medicines only as told by your health care provider.  Exercise regularly, as told by your health care provider. Avoid exercise starting several hours before bedtime.  Use relaxation techniques to manage stress. Ask your health care provider to suggest some techniques that may work well for you. These may include: ? Breathing exercises. ? Routines to release muscle tension. ? Visualizing peaceful scenes.  Make sure that you drive carefully. Avoid driving if you feel very sleepy.  Keep all follow-up visits as told by your health care provider. This is important. Contact a health care provider if:  You are tired throughout the day.  You have trouble in your daily routine due to sleepiness.  You continue to have sleep problems, or your sleep problems get worse. Get help right away if:  You have serious thoughts about hurting yourself or someone else. If you ever feel like you may hurt yourself or others, or have thoughts about taking your own life, get help right away. You can go to your nearest emergency department or call:  Your local emergency services (911 in the [place]  A suicide crisis helpline, such as the [facility] at (308)339-6408. This is open 24 hours a day. Summary  Insomnia is a sleep disorder that makes it difficult to  fall asleep or stay asleep.  Insomnia can be long-term (chronic) or short-term (acute).  Treatment for insomnia depends on the cause. Treatment may focus on treating an underlying condition that is causing insomnia.  Keep a sleep diary to help you and your health care provider figure out what could be causing your insomnia. This information is not intended to replace advice given to you by your health care provider. Make sure you discuss any questions you have with your health care provider. Document Revised: 07/05/2017 Document Reviewed: 05/02/2017 Elsevier Patient Education  2020 [place].    Idiopathic Intracranial Hypertension  Idiopathic intracranial hypertension (IIH) is a condition that increases pressure around the brain. The fluid that surrounds the brain and spinal cord (cerebrospinal fluid, CSF) increases and causes the pressure. Idiopathic means that the cause of this condition is not known. IIH affects the brain and spinal cord (is a neurological disorder). If this condition is not treated, it can cause vision loss or blindness. What increases the risk? You are more likely to develop this condition if:  You are severely overweight (obese).  You are a woman who has not gone through menopause.  You take certain medicines, such as birth control or  steroids. What are the signs or symptoms? Symptoms of IIH include:  Headaches. This is the most common symptom.  Pain in the shoulders or neck.  Nausea and vomiting.  A "rushing water" or pulsing sound within the ears (pulsatile tinnitus).  Double vision.  Blurred vision.  Brief episodes of complete vision loss. How is this diagnosed? This condition may be diagnosed based on:  Your symptoms.  Your medical history.  CT scan of the brain.  MRI of the brain.  Magnetic resonance venogram (MRV) to check veins in the brain.  Diagnostic lumbar puncture. This is a procedure to remove and examine a sample of  cerebrospinal fluid. This procedure can determine whether too much fluid may be causing IIH.  A thorough eye exam to check for swelling or nerve damage in the eyes. How is this treated? Treatment for this condition depends on your symptoms. The goal of treatment is to decrease the pressure around your brain. Common treatments include:  Medicines to decrease the production of spinal fluid and lower the pressure within your skull.  Medicines to prevent or treat headaches.  Surgery to place drains (shunts) in your brain to remove excess fluid.  Lumbar puncture to remove excess cerebrospinal fluid. Follow these instructions at home:  If you are overweight or obese, work with your health care provider to lose weight.  Take over-the-counter and prescription medicines only as told by your health care provider.  Do not drive or use heavy machinery while taking medicines that can make you sleepy.  Keep all follow-up visits as told by your health care provider. This is important. Contact a health care provider if:  You have changes in your vision, such as: ? Double vision. ? Not being able to see colors (color vision). Get help right away if:  You have any of the following symptoms and they get worse or do not get better. ? Headaches. ? Nausea. ? Vomiting. ? Vision changes or difficulty seeing. Summary  Idiopathic intracranial hypertension (IIH) is a condition that increases pressure around the brain. The cause is not known (is idiopathic).  The most common symptom of IIH is headaches.  Treatment may include medicines or surgery to relieve the pressure on your brain. This information is not intended to replace advice given to you by your health care provider. Make sure you discuss any questions you have with your health care provider. Document Revised: 07/05/2017 Document Reviewed: 06/13/2016 Elsevier Patient Education  2020 [employer].

## 2020-06-15 ENCOUNTER — Encounter: Payer: Self-pay | Admitting: Family Medicine

## 2020-06-15 ENCOUNTER — Ambulatory Visit: Payer: Managed Care, Other (non HMO) | Admitting: Family Medicine

## 2020-06-15 VITALS — BP 119/75 | HR 98 | Ht 63.0 in | Wt 205.0 lb

## 2020-06-15 DIAGNOSIS — H93A1 Pulsatile tinnitus, right ear: Secondary | ICD-10-CM | POA: Diagnosis not present

## 2020-06-15 DIAGNOSIS — R519 Headache, unspecified: Secondary | ICD-10-CM

## 2020-06-15 DIAGNOSIS — F5104 Psychophysiologic insomnia: Secondary | ICD-10-CM | POA: Diagnosis not present

## 2020-06-17 ENCOUNTER — Other Ambulatory Visit: Payer: Self-pay

## 2020-06-17 ENCOUNTER — Ambulatory Visit
Admission: RE | Admit: 2020-06-17 | Discharge: 2020-06-17 | Disposition: A | Discharge status: Home / Self Care | Payer: Managed Care, Other (non HMO) | Source: Ambulatory Visit

## 2020-06-17 DIAGNOSIS — Z1231 Encounter for screening mammogram for malignant neoplasm of breast: Secondary | ICD-10-CM

## 2020-06-21 ENCOUNTER — Encounter: Payer: Self-pay | Admitting: Nurse Practitioner

## 2020-06-21 ENCOUNTER — Ambulatory Visit: Payer: Managed Care, Other (non HMO) | Admitting: Nurse Practitioner

## 2020-06-21 VITALS — BP 124/76 | HR 104 | Ht 63.0 in | Wt 206.0 lb

## 2020-06-21 DIAGNOSIS — K219 Gastro-esophageal reflux disease without esophagitis: Secondary | ICD-10-CM

## 2020-06-21 DIAGNOSIS — Z1211 Encounter for screening for malignant neoplasm of colon: Secondary | ICD-10-CM | POA: Diagnosis not present

## 2020-06-21 DIAGNOSIS — R1012 Left upper quadrant pain: Secondary | ICD-10-CM | POA: Diagnosis not present

## 2020-06-21 MED ORDER — SUTAB 1479-225-188 MG PO TABS: 1.0000 | ORAL_TABLET | Freq: Once | ORAL | 0 refills | Status: AC

## 2020-06-21 NOTE — Patient Instructions (Addendum)
If you are age 60 or older, your body mass index should be between 23-30. Your Body mass index is 36.49 kg/m. If this is out of the aforementioned range listed, please consider follow up with your Primary Care Provider.  If you are age 41 or younger, your body mass index should be between 19-25. Your Body mass index is 36.49 kg/m. If this is out of the aformentioned range listed, please consider follow up with your Primary Care Provider.   You have been scheduled for a colonoscopy. Please follow written instructions given to you at your visit today.  Please pick up your prep supplies at the pharmacy within the next 1-3 days. If you use inhalers (even only as needed), please bring them with you on the day of your procedure.  Due to recent changes in healthcare laws, you may see the results of your imaging and laboratory studies on MyChart before your provider has had a chance to review them.  We understand that in some cases there may be results that are confusing or concerning to you. Not all laboratory results come back in the same time frame and the provider may be waiting for multiple results in order to interpret others.  Please give [place] 48 hours in order for your provider to thoroughly review all the results before contacting the office for clarification of your results.

## 2020-06-21 NOTE — Progress Notes (Signed)
ASSESSMENT AND PLAN     # 59 yo female with abdominal pain, mainly LUQ and after eating. In ED 04/04/20 with generalized abdominal pain. CT scan with contrast overall unrevealing except ? right colon thickening (probably underdistention) .  CMP unremarkable. WBC was 11.9.  Weight stable. A little difficult to sort out since she also has left lower rib tenderness on exam. Rib xrays are negative. The LUQ pain has been getting better over the last couple of weeks, possible related to reduction in stress level she thinks ( patient retired 06/06/20).  --Will hold off on further workup for now but low threshold for arranging for EGD if things do not continue to improve  # Colon cancer screening.  Due for 10-year colonoscopy --Patient will be scheduled for a colonoscopy. The risks and benefits of colonoscopy with possible polypectomy / biopsies were discussed and the patient agrees to proceed.   # Chronic GERD  --Asymptomatic on daily Nexium  # Chronic thrombocytopenia.  Etiology unclear.  Normal-appearing spleen on CT scan.  Platelet count stable  ~ 130K to 140K.   # Hepatic steatosis  HISTORY OF PRESENT ILLNESS     Primary Gastroenterologist : Previously - Dr. Olevia Perches.   Chief Complaint : time for colonoscopy. Also, recent upper abdominal pain  Makayla [name] is a 59 y.o. female with PMH / Loraine significant for,  but not necessarily limited to: Diverticulosis, hypertension, hyperlipidemia, obesity, anxiety, cholecystectomy, hysterectomy, history of rectocele repair   Patient last colonoscopy was September 2011, she is due for her 10-year colonoscopy.  BMs normal, no blood in stool. No Francis of colon cancer.   Patient referred by PCP for evaluation of abdominal discomfort . Over the summer patient was having abdominal bloating and generalized abdominal pain. The pain is predominantly in LUQ,  worse after eating. She has occasional nausea. Pain starts out sharp then turns to dull ache. Episodes  can actually start during a meal and last 30 minutes or so. Rare NSAID use. She is on chronic PPI therapy. It doesn't hurt to twist or bend but when bending over she feels like "something is there" in LUQ. She feels a small knot.  Additionally,  she sometimes gets RUQ pain radiating through to her back unrelated to eating.   Patient went to ED 04/04/20 for evaluation of generalized abdominal pain and nausea. CT scan w/ contrast looked okay other than ? Right colon wall thickening felt to be underdistention. She hasn't had bloating or abdominal pain in last few weeks. She had been under a lot of stress. Her husband passed from [place] in 2020. 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 She retired November 1st.     Data Reviewed: 05/02/20 CBC normal.  CMP normal ( glucose 116)   Previous Endoscopic Evaluations / Pertinent Studies:   04/04/20 CT scan w/ contrast 1. No acute findings in the abdomen or pelvis. Specifically, no findings to explain the patient's history of lower abdominal pain with nausea and vomiting. 2. Diffuse colonic diverticulosis without diverticulitis. Suggestion of wall thickening in the right colon felt to be related to underdistention as no associated pericolonic edema or inflammation evident. 3. Hepatic steatosis. 4. Tiny hiatal hernia. 5. Aortic Atherosclerosis 6. Duodenal diverticulum  Past Medical History:  Diagnosis Date  . Bursitis, trochanteric    bi/lat   . Dental crowns present    x 4  . Diverticulosis   . GERD (gastroesophageal reflux disease)   . Hyperlipidemia  no current med.  . Hypertension    white coat syndrome,  . Mass of breast, left 01/2012  . Obesity      Past Surgical History:  Procedure Laterality Date  . ABDOMINAL HYSTERECTOMY     partial  . BREAST EXCISIONAL BIOPSY Left 2013  . BREAST SURGERY  01/10/12   lumpectomy - left  . CHOLECYSTECTOMY N/A 02/10/2016   Procedure: LAPAROSCOPIC CHOLECYSTECTOMY WITH  INTRAOPERATIVE CHOLANGIOGRAM;  Surgeon: Autumn Messing III, MD;  Location: [facility];  Service: General;  Laterality: N/A;  . RECTOCELE REPAIR  06/14/2011   Procedure: POSTERIOR REPAIR (RECTOCELE);  Surgeon: Dutch Gray, MD;  Location: [facility];  Service: Urology;  Laterality: N/A;  cystoscopy, SPARC sling  . ROBOTIC ASSISTED LAPAROSCOPIC SACROCOLPOPEXY  06/14/2011   Procedure: ROBOTIC ASSISTED LAPAROSCOPIC SACROCOLPOPEXY;  Surgeon: Dutch Gray, MD;  Location: [facility];  Service: Urology;  Laterality: N/A;  . TUBAL LIGATION     Family History  Problem Relation Age of Onset  . Hypertension Mother   . Hyperlipidemia Mother   . Diabetes Mother   . Breast cancer Mother 72  . Uterine cancer Mother   . GER disease Father   . Depression Sister   . Heart disease Brother   . Heart attack Maternal Grandmother   . Hypertension Maternal Grandmother    Social History   Tobacco Use  . Smoking status: Never Smoker  . Smokeless tobacco: Never Used  Vaping Use  . Vaping Use: Never used  Substance Use Topics  . Alcohol use: No    Alcohol/week: 0.0 standard drinks  . Drug use: No   Current Outpatient Medications  Medication Sig Dispense Refill  . ALPRAZolam (XANAX) 0.5 MG tablet Take 1 tablet (0.5 mg total) by mouth 2 (two) times daily as needed for anxiety. Caution of sedation 15 tablet 0  . amLODipine (NORVASC) 2.5 MG tablet Take 1 tablet (2.5 mg total) by mouth daily. 90 tablet 3  . esomeprazole (NEXIUM) 20 MG capsule Take 20 mg by mouth daily before breakfast.     . fluticasone (FLONASE) 50 MCG/ACT nasal spray Place 2 sprays into both nostrils daily. 16 g 6  . hydrOXYzine (ATARAX/VISTARIL) 10 MG tablet Take 1 tablet (10 mg total) by mouth 3 (three) times daily as needed (and to help sleep). Caution of sedation 30 tablet 1  . ibuprofen (ADVIL) 800 MG tablet TAKE 1 TABLET BY MOUTH EVERY 8 HOURS AS NEEDED FOR PAIN 90 tablet 1  . sertraline (ZOLOFT) 25 MG tablet Take 1 tablet (25 mg total) by mouth daily. 90  tablet 3  . Vitamin D, Cholecalciferol, 25 MCG (1000 UT) TABS Take 5,000 Units by mouth daily.     No current facility-administered medications for this visit.   Allergies  Allergen Reactions  . Buspar [Buspirone]     Made her more anxious     Review of Systems:  All systems reviewed and negative except where noted in HPI.   PHYSICAL EXAM :    Wt Readings from Last 3 Encounters:  06/21/20 206 lb (93.4 kg)  06/15/20 205 lb (93 kg)  06/13/20 205 lb (93 kg)    BP 124/76   Pulse (!) 104   Ht 5\' 3"  (1.6 m)   Wt 206 lb (93.4 kg)   BMI 36.49 kg/m  Constitutional:  Pleasant female in no acute distress. Psychiatric: Normal mood and affect. Behavior is normal. EENT: Pupils normal.  Conjunctivae are normal. No scleral icterus. Neck supple.  Cardiovascular: Normal rate, regular  rhythm. No edema Pulmonary/chest: Effort normal and breath sounds normal. No wheezing, rales or rhonchi. Abdominal: Soft, nondistended, nontender. Bowel sounds active throughout. There are no masses palpable. No hepatomegaly.  Neurological: Alert and oriented to person place and time. Skin: Skin is warm and dry. No rashes noted.  Tye Savoy, NP  06/21/2020, 9:51 AM  Cc:  Referring Provider Tower, Wynelle Fanny, MD

## 2020-06-22 NOTE — Progress Notes (Signed)
____________________________________________________________  Attending physician addendum:  Thank you for sending this case to me. I have reviewed the entire note and agree with the plan.   Annamae Shivley Danis, MD  ____________________________________________________________  

## 2020-07-17 ENCOUNTER — Encounter: Payer: Self-pay | Admitting: Family Medicine

## 2020-07-18 MED ORDER — SERTRALINE HCL 25 MG PO TABS
25.0000 mg | ORAL_TABLET | Freq: Every day | ORAL | 2 refills | Status: DC
Start: 2020-07-18 — End: 2021-03-31

## 2020-08-03 ENCOUNTER — Telehealth: Payer: Self-pay | Admitting: Gastroenterology

## 2020-08-03 NOTE — Telephone Encounter (Signed)
Good afternoon Dr. Myrtie Neither, this patient called stating she could not find a care partner to come with her to her procedure and had to reschedule appointment from 08/09/20 to 08/17/20.

## 2020-08-09 ENCOUNTER — Encounter: Payer: Self-pay | Admitting: Gastroenterology

## 2020-08-17 ENCOUNTER — Telehealth: Payer: Self-pay | Admitting: Gastroenterology

## 2020-08-17 ENCOUNTER — Encounter: Payer: Self-pay | Admitting: Gastroenterology

## 2020-08-17 NOTE — Telephone Encounter (Signed)
Suprep or Plenvu ( either is fine, depending on insurance coverage)  Yes, pre-medication with zofran 4 mg.  One tablet before evening and AM prep doses.  Thanks  - HD

## 2020-08-17 NOTE — Telephone Encounter (Signed)
Dr Loletha Carrow,  I am going to call and reschedule this patient for a PV and her procedure as well.  What prep would you recommend for her? Could we give Zofran prior to prep?

## 2020-09-01 ENCOUNTER — Other Ambulatory Visit: Payer: Self-pay

## 2020-09-01 ENCOUNTER — Ambulatory Visit: Payer: Managed Care, Other (non HMO) | Admitting: Podiatry

## 2020-09-01 ENCOUNTER — Encounter: Payer: Self-pay | Admitting: Podiatry

## 2020-09-01 DIAGNOSIS — L989 Disorder of the skin and subcutaneous tissue, unspecified: Secondary | ICD-10-CM | POA: Diagnosis not present

## 2020-09-02 ENCOUNTER — Encounter: Payer: Self-pay | Admitting: Podiatry

## 2020-09-02 NOTE — Progress Notes (Signed)
Subjective:  Patient ID: Makayla [name], female    DOB: 01/05/1961,  MRN: [id number]  Chief Complaint  Patient presents with  . Callouses    Patient presents today for painful callous lesion bottom of left forefoot x 2-3 weeks    60 y.o. female presents with the above complaint.  Patient presents with complaint of left submetatarsal 3 porokeratosis/benign skin lesion.  Patient states is very painful to walk on it.  She has tried some pads which helps.  He feels like walking on a glass is throbbing has been going for 2 to 3 weeks and has progressive gotten worse.  She has not seen anyone else prior to see me.  She denies any other acute complaints.   Review of Systems: Negative except as noted in the HPI. Denies N/V/F/Ch.  Past Medical History:  Diagnosis Date  . Bursitis, trochanteric    bi/lat   . Dental crowns present    x 4  . Diverticulosis   . GERD (gastroesophageal reflux disease)   . Hyperlipidemia    no current med.  . Hypertension    white coat syndrome,  . Mass of breast, left 01/2012  . Obesity     Current Outpatient Medications:  .  amLODipine (NORVASC) 2.5 MG tablet, Take 1 tablet (2.5 mg total) by mouth daily., Disp: 90 tablet, Rfl: 3 .  esomeprazole (NEXIUM) 20 MG capsule, Take 20 mg by mouth daily before breakfast. , Disp: , Rfl:  .  fluticasone (FLONASE) 50 MCG/ACT nasal spray, Place 2 sprays into both nostrils daily., Disp: 16 g, Rfl: 6 .  ibuprofen (ADVIL) 800 MG tablet, TAKE 1 TABLET BY MOUTH EVERY 8 HOURS AS NEEDED FOR PAIN, Disp: 90 tablet, Rfl: 1 .  sertraline (ZOLOFT) 25 MG tablet, Take 1 tablet (25 mg total) by mouth daily., Disp: 90 tablet, Rfl: 2 .  Vitamin D, Cholecalciferol, 25 MCG (1000 UT) TABS, Take 5,000 Units by mouth daily., Disp: , Rfl:   Social History   Tobacco Use  Smoking Status Never Smoker  Smokeless Tobacco Never Used    Allergies  Allergen Reactions  . Buspar [Buspirone]     Made her more anxious   Objective:  There were no  vitals filed for this visit. There is no height or weight on file to calculate BMI. Constitutional Well developed. Well nourished.  Vascular Dorsalis pedis pulses palpable bilaterally. Posterior tibial pulses palpable bilaterally. Capillary refill normal to all digits.  No cyanosis or clubbing noted. Pedal hair growth normal.  Neurologic Normal speech. Oriented to person, place, and time. Epicritic sensation to light touch grossly present bilaterally.  Dermatologic  hyperkeratotic lesion with central nucleated core noted to the left submetatarsal 3.  No pinpoint bleeding noted.  Pain on palpation to the lesion.  Orthopedic: Normal joint ROM without pain or crepitus bilaterally. No visible deformities. No bony tenderness.   Radiographs: None Assessment:   1. Benign skin lesion    Plan:  Patient was evaluated and treated and all questions answered.  Left submetatarsal 3 porokeratosis/benign skin lesion -I explained to the patient the etiology of skin lesion and various treatment options were discussed.  Given the amount of pain that she is having I believe she will benefit from [facility] therapy to help destroy the lesion.  I discussed this with the patient that generally takes about 3 application.  Patient agrees with the plan would like to proceed with Cantharone therapy. -This with the first application with Cantharone therapy.  No follow-ups  on file.

## 2020-09-05 ENCOUNTER — Encounter: Payer: Self-pay | Admitting: Family Medicine

## 2020-09-05 MED ORDER — IBUPROFEN 800 MG PO TABS: 800.0000 mg | ORAL_TABLET | Freq: Three times a day (TID) | ORAL | 2 refills | Status: DC | PRN

## 2020-09-15 ENCOUNTER — Other Ambulatory Visit: Payer: Self-pay

## 2020-09-15 ENCOUNTER — Ambulatory Visit: Payer: Managed Care, Other (non HMO) | Admitting: Podiatry

## 2020-09-15 ENCOUNTER — Encounter: Payer: Self-pay | Admitting: Podiatry

## 2020-09-15 DIAGNOSIS — L989 Disorder of the skin and subcutaneous tissue, unspecified: Secondary | ICD-10-CM

## 2020-09-15 NOTE — Progress Notes (Signed)
Subjective:  Patient ID: Makayla [name], female    DOB: 1961-07-06,  MRN: [id number]  Chief Complaint  Patient presents with  . Callouses    "its starting to hurt again"    60 y.o. female presents with the above complaint.  Patient presents with follow-up of her left submetatarsal 3 porokeratosis with benign skin lesion.  Patient states her pain is doing a lot better.  She had some pain after the [facility] therapy application.  She is here for second application of Cantharone.   Review of Systems: Negative except as noted in the HPI. Denies N/V/F/Ch.  Past Medical History:  Diagnosis Date  . Bursitis, trochanteric    bi/lat   . Dental crowns present    x 4  . Diverticulosis   . GERD (gastroesophageal reflux disease)   . Hyperlipidemia    no current med.  . Hypertension    white coat syndrome,  . Mass of breast, left 01/2012  . Obesity     Current Outpatient Medications:  .  amLODipine (NORVASC) 2.5 MG tablet, Take 1 tablet (2.5 mg total) by mouth daily., Disp: 90 tablet, Rfl: 3 .  esomeprazole (NEXIUM) 20 MG capsule, Take 20 mg by mouth daily before breakfast. , Disp: , Rfl:  .  fluticasone (FLONASE) 50 MCG/ACT nasal spray, Place 2 sprays into both nostrils daily., Disp: 16 g, Rfl: 6 .  ibuprofen (ADVIL) 800 MG tablet, TAKE 1 TABLET BY MOUTH EVERY 8 HOURS AS NEEDED FOR PAIN, Disp: 90 tablet, Rfl: 1 .  ibuprofen (ADVIL) 800 MG tablet, Take 1 tablet (800 mg total) by mouth every 8 (eight) hours as needed for moderate pain. With food, Disp: 60 tablet, Rfl: 2 .  sertraline (ZOLOFT) 25 MG tablet, Take 1 tablet (25 mg total) by mouth daily., Disp: 90 tablet, Rfl: 2 .  Vitamin D, Cholecalciferol, 25 MCG (1000 UT) TABS, Take 5,000 Units by mouth daily., Disp: , Rfl:   Social History   Tobacco Use  Smoking Status Never Smoker  Smokeless Tobacco Never Used    Allergies  Allergen Reactions  . Buspar [Buspirone]     Made her more anxious   Objective:  There were no vitals filed  for this visit. There is no height or weight on file to calculate BMI. Constitutional Well developed. Well nourished.  Vascular Dorsalis pedis pulses palpable bilaterally. Posterior tibial pulses palpable bilaterally. Capillary refill normal to all digits.  No cyanosis or clubbing noted. Pedal hair growth normal.  Neurologic Normal speech. Oriented to person, place, and time. Epicritic sensation to light touch grossly present bilaterally.  Dermatologic  hyperkeratotic lesion with central nucleated core noted to the left submetatarsal 3.  No pinpoint bleeding noted.  Pain on palpation to the lesion.  Orthopedic: Normal joint ROM without pain or crepitus bilaterally. No visible deformities. No bony tenderness.   Radiographs: None Assessment:   1. Benign skin lesion    Plan:  Patient was evaluated and treated and all questions answered.  Left submetatarsal 3 porokeratosis/benign skin lesion~second application -I explained to the patient the etiology of skin lesion and various treatment options were discussed.  Given the amount of pain that she is having I believe she will benefit from [facility] therapy to help destroy the lesion.  I discussed this with the patient that generally takes about 3 application.  Patient agrees with the plan would like to proceed with Cantharone therapy. --Lesion was debrided today without complications. Hemostasis was achieved and the area was cleaned. Cantharone  was applied followed by an occlusive bandage. Post procedure complications were discussed. Monitor for signs or symptoms of infection and directed to call the office mainly should any occur.   No follow-ups on file.

## 2020-09-26 ENCOUNTER — Ambulatory Visit (AMBULATORY_SURGERY_CENTER): Payer: Self-pay

## 2020-09-26 ENCOUNTER — Other Ambulatory Visit: Payer: Self-pay

## 2020-09-26 VITALS — Ht 63.0 in | Wt 212.0 lb

## 2020-09-26 DIAGNOSIS — Z1211 Encounter for screening for malignant neoplasm of colon: Secondary | ICD-10-CM

## 2020-09-26 MED ORDER — PLENVU 140 G PO SOLR: 1.0000 | Freq: Once | ORAL | 0 refills | Status: AC

## 2020-09-26 MED ORDER — ONDANSETRON HCL 4 MG PO TABS: 4.0000 mg | ORAL_TABLET | ORAL | 0 refills | Status: DC | PRN

## 2020-09-26 NOTE — Progress Notes (Signed)
No egg or soy allergy known to patient  No issues with past sedation with any surgeries or procedures No intubation problems in the past  No FH of Malignant Hyperthermia No diet pills per patient No home 02 use per patient  No blood thinners per patient  Pt denies issues with constipation  No A fib or A flutter  EMMI video to pt or via [place] 19 guidelines implemented in PV today with Pt and RN  Pt is fully vaccinated  for Covid   Pt denies loose or missing teeth, denies dentures, partials, dental implants, capped or bonded teeth  Plenvu Coupon given to pt in PV today , Code to Pharmacy and  NO PA's for preps discussed with pt In PV today  Discussed with pt there will be an out-of-pocket cost for prep and that varies from $0 to 70 dollars   Due to the COVID-19 pandemic we are asking patients to follow certain guidelines.  Pt aware of COVID protocols and [facility] guidelines

## 2020-10-04 ENCOUNTER — Encounter: Payer: Self-pay | Admitting: Gastroenterology

## 2020-10-06 ENCOUNTER — Ambulatory Visit: Payer: Managed Care, Other (non HMO) | Admitting: Podiatry

## 2020-10-06 ENCOUNTER — Ambulatory Visit: Payer: Managed Care, Other (non HMO) | Admitting: Dermatology

## 2020-10-06 ENCOUNTER — Other Ambulatory Visit: Payer: Self-pay

## 2020-10-06 ENCOUNTER — Encounter: Payer: Self-pay | Admitting: Podiatry

## 2020-10-06 DIAGNOSIS — L821 Other seborrheic keratosis: Secondary | ICD-10-CM

## 2020-10-06 DIAGNOSIS — L578 Other skin changes due to chronic exposure to nonionizing radiation: Secondary | ICD-10-CM

## 2020-10-06 DIAGNOSIS — L989 Disorder of the skin and subcutaneous tissue, unspecified: Secondary | ICD-10-CM | POA: Diagnosis not present

## 2020-10-06 DIAGNOSIS — L680 Hirsutism: Secondary | ICD-10-CM

## 2020-10-06 DIAGNOSIS — D179 Benign lipomatous neoplasm, unspecified: Secondary | ICD-10-CM | POA: Diagnosis not present

## 2020-10-06 DIAGNOSIS — L689 Hypertrichosis, unspecified: Secondary | ICD-10-CM

## 2020-10-06 NOTE — Progress Notes (Signed)
   Follow-Up Visit   Subjective  Makayla [name] is a 60 y.o. female who presents for the following: Lipoma (L abdomen, ~8m, painful) and hx of itchy rash (L chest, resolved per pt). She also asks about options for hair growth at face  The following portions of the chart were reviewed this encounter and updated as appropriate:  Tobacco  Allergies  Meds  Problems  Med Hx  Surg Hx  Fam Hx       Objective  Well appearing patient in no apparent distress; mood and affect are within normal limits.  A focused examination was performed including face, arms, abdomen, chest. Relevant physical exam findings are noted in the Assessment and Plan.  Objective  left abdomen: Small rubbery subcutaneous mobile papules and nodules  Objective  Head - Anterior (Face): Terminal hairs upper lip  Assessment & Plan  Lipoma, unspecified site left abdomen  Favor benign lipomas Not changing. Not painful all the time. Would be difficult to excise given small size and that there are multiple.  Recommend tylenol when needed   If grows or becomes more symptomatic, recommend rechecking  Advise not to check too often as pressure could make them more tender. Recommend checking to be sure unchanged once a month   Hypertrichosis Head - Anterior (Face)  Given light hairs, not a great candidate for laser hair removal  Discussed option of prescription Vaniqa cream, electrolysis, facial hair trimmer   Seborrheic Keratoses Chest, face, forehead - Stuck-on, waxy, tan-brown papules and plaques  - Discussed benign etiology and prognosis. - Observe - Call for any changes Actinic Damage - chronic, secondary to cumulative UV radiation exposure/sun exposure over time - diffuse scaly erythematous macules with underlying dyspigmentation - Recommend daily broad spectrum sunscreen SPF 30+ to sun-exposed areas, reapply every 2 hours as needed.  - Recommend staying in the shade or wearing long sleeves, sun  glasses (UVA+UVB protection) and wide brim hats (4-inch brim around the entire circumference of the hat). - Call for new or changing lesions.    Return in about 3 months (around 01/06/2021) for tbse .  I, Ruthell Rummage, CMA, am acting as scribe for Forest Gleason, MD.  Documentation: I have reviewed the above documentation for accuracy and completeness, and I agree with the above.  Forest Gleason, MD

## 2020-10-06 NOTE — Progress Notes (Signed)
Subjective:  Patient ID: Makayla [name], female    DOB: [birth year],  MRN: [id number]  Chief Complaint  Patient presents with  . Callouses    "its better, no pain now.  It just looks ugly"    60 y.o. female presents with the above complaint.  Patient presents with follow-up of left submetatarsal 3 porokeratosis with benign skin lesion.  Patient states she is doing a lot better.  She denies any other acute complaints.  She thinks she may have been held.   Review of Systems: Negative except as noted in the HPI. Denies N/V/F/Ch.  Past Medical History:  Diagnosis Date  . Allergy    pollen  . Anxiety   . Bursitis, trochanteric    bi/lat   . Dental crowns present    x 4  . Diverticulosis   . GERD (gastroesophageal reflux disease)   . Hyperlipidemia    no current med.  . Hypertension    white coat syndrome,  . Mass of breast, left 01/2012  . Obesity     Current Outpatient Medications:  .  amLODipine (NORVASC) 2.5 MG tablet, Take 1 tablet (2.5 mg total) by mouth daily., Disp: 90 tablet, Rfl: 3 .  b complex vitamins capsule, Take 1 capsule by mouth daily., Disp: , Rfl:  .  esomeprazole (NEXIUM) 20 MG capsule, Take 20 mg by mouth daily before breakfast. , Disp: , Rfl:  .  fluticasone (FLONASE) 50 MCG/ACT nasal spray, Place 2 sprays into both nostrils daily. (Patient taking differently: Place 2 sprays into both nostrils daily as needed.), Disp: 16 g, Rfl: 6 .  ibuprofen (ADVIL) 800 MG tablet, TAKE 1 TABLET BY MOUTH EVERY 8 HOURS AS NEEDED FOR PAIN, Disp: 90 tablet, Rfl: 1 .  ibuprofen (ADVIL) 800 MG tablet, Take 1 tablet (800 mg total) by mouth every 8 (eight) hours as needed for moderate pain. With food, Disp: 60 tablet, Rfl: 2 .  ondansetron (ZOFRAN) 4 MG tablet, Take 1 tablet (4 mg total) by mouth as needed for up to 2 doses for nausea or vomiting (Take 4mg  30 minutes prior to first dose of colonoscopy prep, then take 4mg  30 minutes prior to second dose of prep.)., Disp: 2 tablet, Rfl:  0 .  sertraline (ZOLOFT) 25 MG tablet, Take 1 tablet (25 mg total) by mouth daily., Disp: 90 tablet, Rfl: 2 .  Vitamin D, Cholecalciferol, 25 MCG (1000 UT) TABS, Take 5,000 Units by mouth daily., Disp: , Rfl:  .  Vitamin E 200 units TABS, Take by mouth., Disp: , Rfl:   Social History   Tobacco Use  Smoking Status Never Smoker  Smokeless Tobacco Never Used    Allergies  Allergen Reactions  . Buspar [Buspirone]     Made her more anxious   Objective:  There were no vitals filed for this visit. There is no height or weight on file to calculate BMI. Constitutional Well developed. Well nourished.  Vascular Dorsalis pedis pulses palpable bilaterally. Posterior tibial pulses palpable bilaterally. Capillary refill normal to all digits.  No cyanosis or clubbing noted. Pedal hair growth normal.  Neurologic Normal speech. Oriented to person, place, and time. Epicritic sensation to light touch grossly present bilaterally.  Dermatologic  no further hyperkeratotic lesion with central nucleated core noted to the left submetatarsal 3.  No pinpoint bleeding noted.  No pain on palpation to the lesion.  Orthopedic: Normal joint ROM without pain or crepitus bilaterally. No visible deformities. No bony tenderness.   Radiographs: None Assessment:  1. Benign skin lesion    Plan:  Patient was evaluated and treated and all questions answered.  Left submetatarsal 3 porokeratosis/benign skin lesion~second application -Clinically healed with application of Cantharone therapy.  At this time I discussed with her the importance of shoe gear modification and offloading to help with any future porokeratosis.  If any foot and ankle issues arise in the future to come back and see me.  Patient states understanding.   No follow-ups on file.

## 2020-10-06 NOTE — Patient Instructions (Signed)
Melanoma ABCDEs ° °Melanoma is the most dangerous type of skin cancer, and is the leading cause of death from skin disease.  You are more likely to develop melanoma if you: °Have light-colored skin, light-colored eyes, or red or blond hair °Spend a lot of time in the sun °Tan regularly, either outdoors or in a tanning bed °Have had blistering sunburns, especially during childhood °Have a close family member who has had a melanoma °Have atypical moles or large birthmarks ° °Early detection of melanoma is key since treatment is typically straightforward and cure rates are extremely high if we catch it early.  ° °The first sign of melanoma is often a change in a mole or a new dark spot.  The ABCDE system is a way of remembering the signs of melanoma. ° °A for asymmetry:  The two halves do not match. °B for border:  The edges of the growth are irregular. °C for color:  A mixture of colors are present instead of an even brown color. °D for diameter:  Melanomas are usually (but not always) greater than 6mm - the size of a pencil eraser. °E for evolution:  The spot keeps changing in size, shape, and color. ° °Please check your skin once per month between visits. You can use a small mirror in front and a large mirror behind you to keep an eye on the back side or your body.  ° °If you see any new or changing lesions before your next follow-up, please call to schedule a visit. ° °Please continue daily skin protection including broad spectrum sunscreen SPF 30+ to sun-exposed areas, reapplying every 2 hours as needed when you're outdoors.  ° °Staying in the shade or wearing long sleeves, sun glasses (UVA+UVB protection) and wide brim hats (4-inch brim around the entire circumference of the hat) are also recommended for sun protection.   ° ° °

## 2020-10-09 ENCOUNTER — Encounter: Payer: Self-pay | Admitting: Dermatology

## 2020-10-10 ENCOUNTER — Ambulatory Visit (AMBULATORY_SURGERY_CENTER): Payer: Managed Care, Other (non HMO) | Admitting: Gastroenterology

## 2020-10-10 ENCOUNTER — Encounter: Payer: Self-pay | Admitting: Gastroenterology

## 2020-10-10 ENCOUNTER — Other Ambulatory Visit: Payer: Self-pay

## 2020-10-10 ENCOUNTER — Telehealth: Payer: Self-pay | Admitting: Gastroenterology

## 2020-10-10 ENCOUNTER — Other Ambulatory Visit: Payer: Self-pay | Admitting: Gastroenterology

## 2020-10-10 VITALS — BP 129/66 | HR 115 | Temp 97.5°F | Resp 20 | Ht 63.0 in | Wt 212.0 lb

## 2020-10-10 DIAGNOSIS — R059 Cough, unspecified: Secondary | ICD-10-CM

## 2020-10-10 DIAGNOSIS — Z1211 Encounter for screening for malignant neoplasm of colon: Secondary | ICD-10-CM | POA: Diagnosis not present

## 2020-10-10 MED ORDER — ONDANSETRON HCL 4 MG PO TABS: 4.0000 mg | ORAL_TABLET | Freq: Three times a day (TID) | ORAL | 0 refills | Status: DC | PRN

## 2020-10-10 MED ORDER — SODIUM CHLORIDE 0.9 % IV SOLN: 500.0000 mL | INTRAVENOUS | Status: DC

## 2020-10-10 MED ORDER — AMOXICILLIN-POT CLAVULANATE 875-125 MG PO TABS: 1.0000 | ORAL_TABLET | Freq: Two times a day (BID) | ORAL | 0 refills | Status: DC

## 2020-10-10 MED ORDER — GUAIFENESIN-CODEINE 100-10 MG/5ML PO SOLN: 10.0000 mL | Freq: Four times a day (QID) | ORAL | 0 refills | Status: DC | PRN

## 2020-10-10 NOTE — Patient Instructions (Signed)
Handout was given to your care partner on diverticulosis. Prescription was sent to [facility] in [place], [place] at [facility] for Augmentin 875 mg for 5 days and a written prescription was given to your daughter for Robitussin [facility]. You may resume your other current medications today. Due to semi-liquid solid stool found in the colon, Dr. Loletha Carrow recommends repeating colonoscopy in 6-12 months for screening purposes with a 2 day bowel prep. Please call if any questions or concerns.     YOU HAD AN ENDOSCOPIC PROCEDURE TODAY AT [facility]:   Refer to the procedure report that was given to you for any specific questions about what was found during the examination.  If the procedure report does not answer your questions, please call your gastroenterologist to clarify.  If you requested that your care partner not be given the details of your procedure findings, then the procedure report has been included in a sealed envelope for you to review at your convenience later.  YOU SHOULD EXPECT: Some feelings of bloating in the abdomen. Passage of more gas than usual.  Walking can help get rid of the air that was put into your GI tract during the procedure and reduce the bloating. If you had a lower endoscopy (such as a colonoscopy or flexible sigmoidoscopy) you may notice spotting of blood in your stool or on the toilet paper. If you underwent a bowel prep for your procedure, you may not have a normal bowel movement for a few days.  Please Note:  You might notice some irritation and congestion in your nose or some drainage.  This is from the oxygen used during your procedure.  There is no need for concern and it should clear up in a day or so.  SYMPTOMS TO REPORT IMMEDIATELY:   Following lower endoscopy (colonoscopy or flexible sigmoidoscopy):  Excessive amounts of blood in the stool  Significant tenderness or worsening of abdominal pains  Swelling of the abdomen that is new,  acute  Fever of 100F or higher   For urgent or emergent issues, a gastroenterologist can be reached at any hour by calling [phone]. Do not use MyChart messaging for urgent concerns.    DIET:  We do recommend a small meal at first, but then you may proceed to your regular diet.  Drink plenty of fluids but you should avoid alcoholic beverages for 24 hours.  ACTIVITY:  You should plan to take it easy for the rest of today and you should NOT DRIVE or use heavy machinery until tomorrow (because of the sedation medicines used during the test).    FOLLOW UP: Our staff will call the number listed on your records 48-72 hours following your procedure to check on you and address any questions or concerns that you may have regarding the information given to you following your procedure. If we do not reach you, we will leave a message.  We will attempt to reach you two times.  During this call, we will ask if you have developed any symptoms of COVID 19. If you develop any symptoms (ie: fever, flu-like symptoms, shortness of breath, cough etc.) before then, please call [phone].  If you test positive for Covid 19 in the 2 weeks post procedure, please call and report this information to [place].    If any biopsies were taken you will be contacted by phone or by letter within the next 1-3 weeks.  Please call us at [phone] if you have not  heard about the biopsies in 3 weeks.    SIGNATURES/CONFIDENTIALITY: You and/or your care partner have signed paperwork which will be entered into your electronic medical record.  These signatures attest to the fact that that the information above on your After Visit Summary has been reviewed and is understood.  Full responsibility of the confidentiality of this discharge information lies with you and/or your care-partner.

## 2020-10-10 NOTE — Progress Notes (Signed)
Dr. Loletha Carrow spoke with Makayla [name] and her daughter, Anderson Malta in the recovery room.  He said to keep Makayla [name] longer to monitor her elevated heart rate and signs and symptoms of aspiration.  Dr. Loletha Carrow will examine Makayla [name] before discharge.    Makayla [name]'s H/R remaining in the 120's.  Has a non-productive cough periodiacaly.   B/P stable.  P/t c/o irratated sore throat.

## 2020-10-10 NOTE — Progress Notes (Signed)
1145  Patient waking up and becoming combative,  Dr Loletha Carrow holding patient hands.  O 2 sat down to 90 91 percent, albuteral given, vss

## 2020-10-10 NOTE — Progress Notes (Signed)
1134 Pt started vomiting with vomitus coming out of nose and mouth. O2 sat down to mid 60s with laryngeal spasm.  Jaw thrust performed with O2 sat coming up to mid 80s, patient  started fighting with sedation given.  Robinal and Zofran given.

## 2020-10-10 NOTE — Op Note (Signed)
[place] Patient Name: Makayla [name] Procedure Date: 10/10/2020 11:18 AM MRN: [id number] Endoscopist: Mallie Mussel L. Loletha Carrow , MD Age: 60 Referring MD:  Date of Birth: 02-Apr-1961 Gender: Female Account #: [id number] Procedure:                Colonoscopy Indications:              Screening for colorectal malignant neoplasm (no                            polyps on 04/2010 colonoscopy) Medicines:                Monitored Anesthesia Care Procedure:                Pre-Anesthesia Assessment:                           - Prior to the procedure, a History and Physical                            was performed, and patient medications and                            allergies were reviewed. The patient's tolerance of                            previous anesthesia was also reviewed. The risks                            and benefits of the procedure and the sedation                            options and risks were discussed with the patient.                            All questions were answered, and informed consent                            was obtained. Prior Anticoagulants: The patient has                            taken no previous anticoagulant or antiplatelet                            agents. ASA Grade Assessment: II - A patient with                            mild systemic disease. After reviewing the risks                            and benefits, the patient was deemed in                            satisfactory condition to undergo the procedure.  After obtaining informed consent, the colonoscope                            was passed under direct vision. Throughout the                            procedure, the patient's blood pressure, pulse, and                            oxygen saturations were monitored continuously. The                            Olympus CF-HQ190L [id number]) Colonoscope was                            introduced through the anus and advanced  to the the                            cecum, identified by appendiceal orifice and                            ileocecal valve. The colonoscopy was somewhat                            difficult due to multiple diverticula in the colon,                            a redundant colon and a tortuous colon. Successful                            completion of the procedure was aided by using                            manual pressure. The patient tolerated the                            procedure poorly due to vomiting and probable                            aspiration - coughing and oxygen desaturation to                            low of 78%. Required suctioning, increased oxygen                            administration and chin lift, jaw thrust until                            patient awake and able to protect airway.(see                            anesthesia charting) The quality of the bowel  preparation was fair. The ileocecal valve,                            appendiceal orifice, and rectum were photographed.                            The quality of the bowel preparation was evaluated                            using the BBPS [facility] Bowel Preparation Scale)                            with scores of: Right Colon = 2, Transverse Colon =                            2 and Left Colon = 1. The total BBPS score equals                            5. The bowel preparation used was Plenvu. Scope In: 11:29:23 AM Scope Out: 11:42:27 AM Scope Withdrawal Time: 0 hours 8 minutes 26 seconds  Total Procedure Duration: 0 hours 13 minutes 4 seconds  Findings:                 The perianal and digital rectal examinations were                            normal.                           Many diverticula were found in the entire colon.                           Semi-liquid solid stool was found in the sigmoid                            colon and in the descending colon, interfering  with                            visualization. Stool debris in remainder of colon                            partially cleared with lavage, but left colon could                            not be cleared. Procedure terminated early due to                            respiratory distress (see above).                           Retroflexion in the rectum was not performed. Complications:            Aspiration Estimated Blood Loss:     Estimated blood loss: none. Impression:               -  Preparation of the colon was fair, particularly                            in the left colon.                           - Diverticulosis in the entire examined colon.                           - Stool in the sigmoid colon and in the descending                            colon.                           - No specimens collected. Recommendation:           - Patient has a contact number available for                            emergencies. The signs and symptoms of potential                            delayed complications were discussed with the                            patient. Return to normal activities tomorrow.                            Written discharge instructions were provided to the                            patient.                           - Resume previous diet.                           - Continue present medications.                           - Augmentin (amoxicillin/clavulanate) 875 mg PO BID                            for 5 days. Disp#10 tabs, RF zero                           - Robitussin AC - 10cc PO Q 4 hrs as needed for                            cough (MD will Rx)                           - Repeat colonoscopy in 6-12 months for screening  purposes. 2-day bowel prep for next exam. Leodan Bolyard L. Loletha Carrow, MD 10/10/2020 12:13:13 PM This report has been signed electronically.

## 2020-10-10 NOTE — Telephone Encounter (Signed)
This patient is not feeling well after a colonoscopy earlier today, during which she vomited and aspirated.  She was prescribed Augmentin and Robitussin-AC for cough medicine.  Please see the callback note from her daughter late this afternoon.   I will send a prescription for ondansetron to help treat the nausea. She can also take some Tylenol for the low-grade temp. I expect this will likely settle down by tomorrow.  Please call her tomorrow to get an update on symptoms.  - HD

## 2020-10-10 NOTE — Progress Notes (Signed)
Pt's states no medical or surgical changes since previsit or office visit. 

## 2020-10-10 NOTE — Telephone Encounter (Signed)
Spoke with Makayla [name], she is aware that Dr. Loletha Carrow has prescribed Zofran for the nausea and advised that patient can take Tylenol for the low grade temperature. Advised that we will follow up with them tomorrow to see how she is feeling. Makayla [name] verbalized understanding and had no concerns at the end of the call.

## 2020-10-10 NOTE — Telephone Encounter (Signed)
Patients daughter called patient is not well has been throwing up and is very cold. Please advise

## 2020-10-10 NOTE — Telephone Encounter (Signed)
Pt's daughter called stating that the patient has been nauseated and vomited several times since she was discharged. She is "Cold" and has a low grade temp of 99.9. Will let Dr. Loletha Carrow know.

## 2020-10-10 NOTE — Progress Notes (Signed)
Patient fully awake, following commands, denies chest pain, or difficulty breathing.  Report given to PACU, vss

## 2020-10-11 NOTE — Telephone Encounter (Signed)
Lm on vm for Anderson Malta to return call.

## 2020-10-11 NOTE — Telephone Encounter (Signed)
Dr. Loletha Carrow, just an fyi. Thanks.  Spoke with patient's daughters, she states that patient is doing much better today, no longer having any chills and body temp is back to normal. Patient reported some slight nausea, eating light and drinking okay today. Advised that she can take Zofran as needed for the nausea. Anderson Malta wanted to know if she needed to take the cough medicine that was sent in, advised that she can take it as needed so if she is not currently having issues with the cough she can hold off on it. Anderson Malta has been advised to give [place] a call and let us know if anything changes. Anderson Malta verbalized understanding, thanked me for the call and had no further concerns.

## 2020-10-11 NOTE — Telephone Encounter (Signed)
Thanks very much for the update.  Glad to hear she is better.  - HD

## 2020-10-12 ENCOUNTER — Telehealth: Payer: Self-pay

## 2020-10-12 NOTE — Telephone Encounter (Signed)
[facility] [place] front office mgr sent me teams note that pt has appt with Dr Glori Bickers on 10/14/20 for cough and wheezing that pt believes is related to a procedure. Appt needing to be changed and triaged due to Dr Glori Bickers being out of office 10/14/20. I spoke with pt; pt said on 10/10/20 she was to have a colonoscopy but could not complete colonoscopy due to pt aspirating; GI gave pt abx and cough med. Pt said she is not really SOB pulse ox is 95% but pt said she could not stop coughing, pt is wheezing and pt cannot get a deep breath. Pt said CXR was not done. Explained to pt would need to cancel appt with Dr Glori Bickers on 10/14/20 and thought pt needed to be seen with a face to face visit for someone to listen to her lungs and possible CXR if needed. Pt voiced understanding and will go to [facility]. Pt will cb with update later this week. Sending note to Dr Glori Bickers as Juluis Rainier.

## 2020-10-12 NOTE — Telephone Encounter (Signed)
Aware, I will watch for correspondence

## 2020-10-12 NOTE — Telephone Encounter (Signed)
  Follow up Call-  Call back number 10/10/2020  Post procedure Call Back phone  # [phone]  Permission to leave phone message Yes  Some recent data might be hidden     Patient questions:  Do you have a fever, pain , or abdominal swelling? No. Pain Score  0 *  Have you tolerated food without any problems? Yes.    Have you been able to return to your normal activities? Yes.    Do you have any questions about your discharge instructions: Diet   No. Medications  No. Follow up visit  No.  Do you have questions or concerns about your Care? Yes.  Patient said she felt better on Tuesday than today, she sounds a little hoarse but is not complaining of a fever or pain. She is eating and trying to do some deep breathing exercises. She states that she has not started her cough medicine because she thinks that the coughing is good air movement and I agreed with her. She is taking the antibiotic that the MD ordered and has an upcoming appointment this Friday with her PCP.   Actions: * If pain score is 4 or above: No action needed, pain <4.   1. Have you developed a fever since your procedure? no  2.   Have you had an respiratory symptoms (SOB or cough) since your procedure? no  3.   Have you tested positive for COVID 19 since your procedure no  4.   Have you had any family members/close contacts diagnosed with the COVID 19 since your procedure?  no   If yes to any of these questions please route to Joylene John, RN and Joella Prince, RN

## 2020-10-12 NOTE — Telephone Encounter (Signed)
LVM

## 2020-10-14 ENCOUNTER — Ambulatory Visit: Payer: Managed Care, Other (non HMO) | Admitting: Family Medicine

## 2020-10-27 ENCOUNTER — Encounter: Payer: Self-pay | Admitting: Family Medicine

## 2020-10-27 DIAGNOSIS — T3695XA Adverse effect of unspecified systemic antibiotic, initial encounter: Secondary | ICD-10-CM

## 2020-10-27 DIAGNOSIS — B379 Candidiasis, unspecified: Secondary | ICD-10-CM

## 2020-10-28 MED ORDER — FLUCONAZOLE 150 MG PO TABS: 150.0000 mg | ORAL_TABLET | Freq: Once | ORAL | 0 refills | Status: DC

## 2020-10-28 NOTE — Telephone Encounter (Signed)
See mychart message. PCP just prescribed Augmentin on 10/10/20. PCP isn't in the office today and will not be checking her computer. Will route to another provider for review

## 2021-01-19 ENCOUNTER — Ambulatory Visit: Payer: Managed Care, Other (non HMO) | Admitting: Dermatology

## 2021-01-19 ENCOUNTER — Other Ambulatory Visit: Payer: Self-pay

## 2021-01-19 DIAGNOSIS — L559 Sunburn, unspecified: Secondary | ICD-10-CM | POA: Diagnosis not present

## 2021-01-19 DIAGNOSIS — D2372 Other benign neoplasm of skin of left lower limb, including hip: Secondary | ICD-10-CM

## 2021-01-19 DIAGNOSIS — L570 Actinic keratosis: Secondary | ICD-10-CM

## 2021-01-19 DIAGNOSIS — D235 Other benign neoplasm of skin of trunk: Secondary | ICD-10-CM

## 2021-01-19 DIAGNOSIS — Z1283 Encounter for screening for malignant neoplasm of skin: Secondary | ICD-10-CM | POA: Diagnosis not present

## 2021-01-19 DIAGNOSIS — L821 Other seborrheic keratosis: Secondary | ICD-10-CM

## 2021-01-19 DIAGNOSIS — L814 Other melanin hyperpigmentation: Secondary | ICD-10-CM | POA: Diagnosis not present

## 2021-01-19 DIAGNOSIS — L820 Inflamed seborrheic keratosis: Secondary | ICD-10-CM | POA: Diagnosis not present

## 2021-01-19 DIAGNOSIS — D1800 Hemangioma unspecified site: Secondary | ICD-10-CM

## 2021-01-19 DIAGNOSIS — L578 Other skin changes due to chronic exposure to nonionizing radiation: Secondary | ICD-10-CM

## 2021-01-19 DIAGNOSIS — D229 Melanocytic nevi, unspecified: Secondary | ICD-10-CM

## 2021-01-19 MED ORDER — TRIAMCINOLONE ACETONIDE 0.1 % EX CREA: 1.0000 "application " | TOPICAL_CREAM | Freq: Two times a day (BID) | CUTANEOUS | 1 refills | Status: DC | PRN

## 2021-01-19 NOTE — Progress Notes (Signed)
Follow-Up Visit   Subjective  Makayla [name] is a 60 y.o. female who presents for the following: Annual Exam (Patient here today for tbse. Patient has few spots at back she would like checked. Daughter recently noticed a few weeks ago. Not bothering patient she would just like to have checked. Patient also has lipoma at left abdomen. States it just bothers when picks up something heavy. She also reports a small bump at right temple/forehead she would like checked. ).  Patient here for full body skin exam and skin cancer screening.  The following portions of the chart were reviewed this encounter and updated as appropriate:  Tobacco  Allergies  Meds  Problems  Med Hx  Surg Hx  Fam Hx        Objective  Well appearing patient in no apparent distress; mood and affect are within normal limits.  A full examination was performed including scalp, head, eyes, ears, nose, lips, neck, chest, axillae, abdomen, back, buttocks, bilateral upper extremities, bilateral lower extremities, hands, feet, fingers, toes, fingernails, and toenails. All findings within normal limits unless otherwise noted below.  left forehead x 1 Erythematous thin papules/macules with gritty scale.   right forehead x 1 Erythematous keratotic or waxy stuck-on papule or plaque.   Mid Back Peeling erythematous patches  Assessment & Plan  Actinic keratosis left forehead x 1  Actinic keratoses are precancerous spots that appear secondary to cumulative UV radiation exposure/sun exposure over time. They are chronic with expected duration over 1 year. A portion of actinic keratoses will progress to squamous cell carcinoma of the skin. It is not possible to reliably predict which spots will progress to skin cancer and so treatment is recommended to prevent development of skin cancer.  Recommend daily broad spectrum sunscreen SPF 30+ to sun-exposed areas, reapply every 2 hours as needed.  Recommend staying in the shade or  wearing long sleeves, sun glasses (UVA+UVB protection) and wide brim hats (4-inch brim around the entire circumference of the hat). Call for new or changing lesions.  Prior to procedure, discussed risks of blister formation, small wound, skin dyspigmentation, or rare scar following cryotherapy. Recommend Vaseline ointment to treated areas while healing.   Destruction of lesion - left forehead x 1  Destruction method: cryotherapy   Informed consent: discussed and consent obtained   Lesion destroyed using liquid nitrogen: Yes   Cryotherapy cycles:  2 Outcome: patient tolerated procedure well with no complications   Post-procedure details: wound care instructions given    Inflamed seborrheic keratosis right forehead x 1  Prior to procedure, discussed risks of blister formation, small wound, skin dyspigmentation, or rare scar following cryotherapy. Recommend Vaseline ointment to treated areas while healing.   Destruction of lesion - right forehead x 1  Destruction method: cryotherapy   Informed consent: discussed and consent obtained   Lesion destroyed using liquid nitrogen: Yes   Cryotherapy cycles:  2 Outcome: patient tolerated procedure well with no complications   Post-procedure details: wound care instructions given    Sunburn Mid Back  Start triamcinolone 0.1% cream apply 1 application topically bid daily prn to affected areas of back for 2 weeks.   Topical steroids (such as triamcinolone, fluocinolone, fluocinonide, mometasone, clobetasol, halobetasol, betamethasone, hydrocortisone) can cause thinning and lightening of the skin if they are used for too long in the same area. Your physician has selected the right strength medicine for your problem and area affected on the body. Please use your medication only as directed  by your physician to prevent side effects.    triamcinolone cream (KENALOG) 0.1 % - Mid Back Apply 1 application topically 2 (two) times daily as needed. Use  for up to 2 week for affected areas of back. Avoid applying to face, groin, and axilla. Use as directed.  Lentigines - Scattered tan macules - Due to sun exposure - Benign-appering, observe - Recommend daily broad spectrum sunscreen SPF 30+ to sun-exposed areas, reapply every 2 hours as needed. - Call for any changes  Dermatofibroma - Firm pink/brown papulenodule with dimple sign on left shin and left buttock - Benign appearing - Call for any changes  Seborrheic Keratoses - Stuck-on, waxy, tan-brown papules and/or plaques  - Benign-appearing - Discussed benign etiology and prognosis. - Observe - Call for any changes  Melanocytic Nevi - Tan-brown and/or pink-flesh-colored symmetric macules and papules - Benign appearing on exam today - Observation - Call clinic for new or changing moles - Recommend daily use of broad spectrum spf 30+ sunscreen to sun-exposed areas.   Hemangiomas - Red papules - Discussed benign nature - Observe - Call for any changes  Actinic Damage - Chronic condition, secondary to cumulative UV/sun exposure - diffuse scaly erythematous macules with underlying dyspigmentation - Recommend daily broad spectrum sunscreen SPF 30+ to sun-exposed areas, reapply every 2 hours as needed.  - Staying in the shade or wearing long sleeves, sun glasses (UVA+UVB protection) and wide brim hats (4-inch brim around the entire circumference of the hat) are also recommended for sun protection.  - Call for new or changing lesions.  Skin cancer screening performed today.  Return for 3 month ak follow up, 1 year tbse . I, Ruthell Rummage, CMA, am acting as scribe for Forest Gleason, MD.  Documentation: I have reviewed the above documentation for accuracy and completeness, and I agree with the above.  Forest Gleason, MD

## 2021-01-19 NOTE — Patient Instructions (Addendum)
Topical steroids (such as triamcinolone, fluocinolone, fluocinonide, mometasone, clobetasol, halobetasol, betamethasone, hydrocortisone) can cause thinning and lightening of the skin if they are used for too long in the same area. Your physician has selected the right strength medicine for your problem and area affected on the body. Please use your medication only as directed by your physician to prevent side effects.      Actinic keratoses are precancerous spots that appear secondary to cumulative UV radiation exposure/sun exposure over time. They are chronic with expected duration over 1 year. A portion of actinic keratoses will progress to squamous cell carcinoma of the skin. It is not possible to reliably predict which spots will progress to skin cancer and so treatment is recommended to prevent development of skin cancer.  Recommend daily broad spectrum sunscreen SPF 30+ to sun-exposed areas, reapply every 2 hours as needed.  Recommend staying in the shade or wearing long sleeves, sun glasses (UVA+UVB protection) and wide brim hats (4-inch brim around the entire circumference of the hat). Call for new or changing lesions.   Cryotherapy Aftercare  Wash gently with soap and water everyday.   Apply Vaseline and Band-Aid daily until healed.    Melanoma ABCDEs  Melanoma is the most dangerous type of skin cancer, and is the leading cause of death from skin disease.  You are more likely to develop melanoma if you: Have light-colored skin, light-colored eyes, or red or blond hair Spend a lot of time in the sun Tan regularly, either outdoors or in a tanning bed Have had blistering sunburns, especially during childhood Have a close family member who has had a melanoma Have atypical moles or large birthmarks  Early detection of melanoma is key since treatment is typically straightforward and cure rates are extremely high if we catch it early.   The first sign of melanoma is often a change in a  mole or a new dark spot.  The ABCDE system is a way of remembering the signs of melanoma.  A for asymmetry:  The two halves do not match. B for border:  The edges of the growth are irregular. C for color:  A mixture of colors are present instead of an even brown color. D for diameter:  Melanomas are usually (but not always) greater than 48mm - the size of a pencil eraser. E for evolution:  The spot keeps changing in size, shape, and color.  Please check your skin once per month between visits. You can use a small mirror in front and a large mirror behind you to keep an eye on the back side or your body.   If you see any new or changing lesions before your next follow-up, please call to schedule a visit.  Please continue daily skin protection including broad spectrum sunscreen SPF 30+ to sun-exposed areas, reapplying every 2 hours as needed when you're outdoors.   Staying in the shade or wearing long sleeves, sun glasses (UVA+UVB protection) and wide brim hats (4-inch brim around the entire circumference of the hat) are also recommended for sun protection.     Recommend taking Heliocare sun protection supplement daily in sunny weather for additional sun protection. For maximum protection on the sunniest days, you can take up to 2 capsules of regular Heliocare OR take 1 capsule of Heliocare Ultra. For prolonged exposure (such as a full day in the sun), you can repeat your dose of the supplement 4 hours after your first dose. Heliocare can be purchased at [employer]  [facility] or at [email or web address].    If you have any questions or concerns for your doctor, please call our main line at [phone] and press option 4 to reach your doctor's medical assistant. If no one answers, please leave a voicemail as directed and we will return your call as soon as possible. Messages left after 4 pm will be answered the following business day.   You may also send [place] a message via [place]. We typically respond to  MyChart messages within 1-2 business days.  For prescription refills, please ask your pharmacy to contact our office. Our fax number is [phone].  If you have an urgent issue when the clinic is closed that cannot wait until the next business day, you can page your doctor at the number below.    Please note that while we do our best to be available for urgent issues outside of office hours, we are not available 24/7.   If you have an urgent issue and are unable to reach [place], you may choose to seek medical care at your doctor's office, retail clinic, urgent care center, or emergency room.  If you have a medical emergency, please immediately call 911 or go to the emergency department.  Pager Numbers  - Dr. Nehemiah Massed: [phone]  - Dr. Laurence Ferrari: [phone]  - Dr. Nicole Kindred: [phone]  In the event of inclement weather, please call our main line at [phone] for an update on the status of any delays or closures.  Dermatology Medication Tips: Please keep the boxes that topical medications come in in order to help keep track of the instructions about where and how to use these. Pharmacies typically print the medication instructions only on the boxes and not directly on the medication tubes.   If your medication is too expensive, please contact our office at [phone] option 4 or send [place] a message through [place].   We are unable to tell what your co-pay for medications will be in advance as this is different depending on your insurance coverage. However, we may be able to find a substitute medication at lower cost or fill out paperwork to get insurance to cover a needed medication.   If a prior authorization is required to get your medication covered by your insurance company, please allow [place] 1-2 business days to complete this process.  Drug prices often vary depending on where the prescription is filled and some pharmacies may offer cheaper prices.  The website www.goodrx.com  contains coupons for medications through different pharmacies. The prices here do not account for what the cost may be with help from insurance (it may be cheaper with your insurance), but the website can give you the price if you did not use any insurance.  - You can print the associated coupon and take it with your prescription to the pharmacy.  - You may also stop by our office during regular business hours and pick up a GoodRx coupon card.  - If you need your prescription sent electronically to a different pharmacy, notify our office through [facility] or by phone at [phone] option 4.

## 2021-01-20 ENCOUNTER — Encounter: Payer: Self-pay | Admitting: Family Medicine

## 2021-01-20 ENCOUNTER — Other Ambulatory Visit: Payer: Self-pay

## 2021-01-20 ENCOUNTER — Ambulatory Visit: Payer: Managed Care, Other (non HMO) | Admitting: Family Medicine

## 2021-01-20 DIAGNOSIS — N760 Acute vaginitis: Secondary | ICD-10-CM | POA: Diagnosis not present

## 2021-01-20 LAB — POCT WET PREP (WET MOUNT): Trichomonas Wet Prep HPF POC: ABSENT

## 2021-01-20 MED ORDER — METRONIDAZOLE 500 MG PO TABS: 500.0000 mg | ORAL_TABLET | Freq: Two times a day (BID) | ORAL | 0 refills | Status: AC

## 2021-01-20 NOTE — Progress Notes (Signed)
Subjective:    Patient ID: Makayla [name], female    DOB: 1960-11-20, 60 y.o.   MRN: [id number]  This visit occurred during the SARS-CoV-2 public health emergency.  Safety protocols were in place, including screening questions prior to the visit, additional usage of staff PPE, and extensive cleaning of exam room while observing appropriate contact time as indicated for disinfecting solutions.   HPI Pt presents with vaginal irritation   Wt Readings from Last 3 Encounters:  01/20/21 216 lb (98 kg)  10/10/20 212 lb (96.2 kg)  09/26/20 212 lb (96.2 kg)   38.26 kg/m  Prone to vaginitis Went to beach a few wk ago (was careful to keep clean with water and baby wipe)  Vaginal odor  Discharge-yellow/green  Itching /irritation -not severe  Does not think she has yeast   Not sexually active   No pelvic pain or cramping    Has a rectocele and appt with surgeon to address that soon  Wet prep Results for orders placed or performed in visit on 01/20/21  POCT Wet Prep [facility])  Result Value Ref Range   Source Wet Prep POC vaginal    WBC, Wet Prep HPF POC few    Bacteria Wet Prep HPF POC Moderate (A) Few   BACTERIA WET PREP MORPHOLOGY POC     Clue Cells Wet Prep HPF POC Moderate (A) None   Clue Cells Wet Prep Whiff POC     Yeast Wet Prep HPF POC None None   KOH Wet Prep POC None None   Trichomonas Wet Prep HPF POC Absent Absent     Patient Active Problem List   Diagnosis Date Noted   Vaginitis 01/20/2021   Rib pain on left side 06/13/2020   Abdominal tenderness, left upper quadrant 06/13/2020   Colon cancer screening 05/06/2020   Insomnia, psychophysiological 03/14/2020   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   Retro-orbital pain of left eye 03/14/2020   Left temporal headache 03/14/2020   Headache 02/25/2020   Pain head 02/25/2020   Itching 01/08/2020   Pulsatile tinnitus of right ear 09/30/2019   Immunization reaction 09/08/2019   Tachycardia  10/03/2018   Grief reaction 10/03/2018   Elevated glucose level 01/16/2018   Generalized anxiety disorder 01/23/2017   Chest pain 11/02/2016   Left knee pain 09/13/2015   Vitamin D deficiency 05/20/2015   Obesity 05/20/2015   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   Routine general medical examination at a health care facility 10/16/2011   Hyperlipidemia LDL goal <130 12/04/2007   GERD 12/04/2007   Essential hypertension 12/04/2007   Past Medical History:  Diagnosis Date   Allergy    pollen   Anxiety    Bursitis, trochanteric    bi/lat    Dental crowns present    x 4   Diverticulosis    GERD (gastroesophageal reflux disease)    Hyperlipidemia    no current med.   Hypertension    white coat syndrome,   Mass of breast, left 01/2012   Obesity    Past Surgical History:  Procedure Laterality Date   ABDOMINAL HYSTERECTOMY     partial   BREAST EXCISIONAL BIOPSY Left 2013   BREAST SURGERY  01/10/12   lumpectomy - left   CHOLECYSTECTOMY N/A 02/10/2016   Procedure: LAPAROSCOPIC CHOLECYSTECTOMY WITH INTRAOPERATIVE CHOLANGIOGRAM;  Surgeon: Autumn Messing III, MD;  Location: [facility];  Service: General;  Laterality: N/A;   COLONOSCOPY  04/06/2010   Cristal Generous,  normal w/tics   RECTOCELE REPAIR  06/14/2011   cystocele repair, pt states Dr Vikki Ports did not do rectocele and said she had to come back later.   ROBOTIC ASSISTED LAPAROSCOPIC SACROCOLPOPEXY  06/14/2011   Procedure: ROBOTIC ASSISTED LAPAROSCOPIC SACROCOLPOPEXY;  Surgeon: Dutch Gray, MD;  Location: [facility];  Service: Urology;  Laterality: N/A;   TUBAL LIGATION     Social History   Tobacco Use   Smoking status: Never   Smokeless tobacco: Never  Vaping Use   Vaping Use: Never used  Substance Use Topics   Alcohol use: No    Alcohol/week: 0.0 standard drinks   Drug use: No   Family History  Problem Relation Age of Onset   Hypertension Mother    Hyperlipidemia Mother    Diabetes Mother    Breast cancer Mother  32   Uterine cancer Mother    GER disease Father    Depression Sister    Heart disease Brother    Heart attack Maternal Grandmother    Hypertension Maternal Grandmother    Colon cancer Neg Hx    Colon polyps Neg Hx    Esophageal cancer Neg Hx    Rectal cancer Neg Hx    Stomach cancer Neg Hx    Allergies  Allergen Reactions   Buspar [Buspirone]     Made her more anxious   Current Outpatient Medications on File Prior to Visit  Medication Sig Dispense Refill   amLODipine (NORVASC) 2.5 MG tablet Take 1 tablet (2.5 mg total) by mouth daily. 90 tablet 3   b complex vitamins capsule Take 1 capsule by mouth daily.     esomeprazole (NEXIUM) 20 MG capsule Take 20 mg by mouth daily before breakfast.      fluticasone (FLONASE) 50 MCG/ACT nasal spray Place 2 sprays into both nostrils daily. (Patient taking differently: Place 2 sprays into both nostrils daily as needed.) 16 g 6   hydrOXYzine (ATARAX/VISTARIL) 25 MG tablet Take by mouth.     ibuprofen (ADVIL) 800 MG tablet TAKE 1 TABLET BY MOUTH EVERY 8 HOURS AS NEEDED FOR PAIN 90 tablet 1   sertraline (ZOLOFT) 25 MG tablet Take 1 tablet (25 mg total) by mouth daily. 90 tablet 2   triamcinolone cream (KENALOG) 0.1 % Apply 1 application topically 2 (two) times daily as needed. Use for up to 2 week for affected areas of back. Avoid applying to face, groin, and axilla. Use as directed. 80 g 1   Vitamin D, Cholecalciferol, 25 MCG (1000 UT) TABS Take 5,000 Units by mouth daily.     Vitamin E 200 units TABS Take by mouth.     Current Facility-Administered Medications on File Prior to Visit  Medication Dose Route Frequency Provider Last Rate Last Admin   0.9 %  sodium chloride infusion  500 mL Intravenous Continuous Danis, Estill Cotta III, MD        Review of Systems  Constitutional:  Negative for activity change, appetite change, fatigue, fever and unexpected weight change.  HENT:  Negative for congestion, ear pain, rhinorrhea, sinus pressure and sore  throat.   Eyes:  Negative for pain, redness and visual disturbance.  Respiratory:  Negative for cough, shortness of breath and wheezing.   Cardiovascular:  Negative for chest pain and palpitations.  Gastrointestinal:  Negative for abdominal pain, blood in stool, constipation and diarrhea.  Endocrine: Negative for polydipsia and polyuria.  Genitourinary:  Positive for vaginal discharge. Negative for dysuria, frequency and urgency.  Musculoskeletal:  Negative  for arthralgias, back pain and myalgias.  Skin:  Negative for pallor and rash.  Allergic/Immunologic: Negative for environmental allergies.  Neurological:  Negative for dizziness, syncope and headaches.  Hematological:  Negative for adenopathy. Does not bruise/bleed easily.  Psychiatric/Behavioral:  Negative for decreased concentration and dysphoric mood. The patient is not nervous/anxious.       Objective:   Physical Exam Constitutional:      General: She is not in acute distress.    Appearance: Normal appearance. She is obese. She is not ill-appearing.  Eyes:     Conjunctiva/sclera: Conjunctivae normal.     Pupils: Pupils are equal, round, and reactive to light.  Cardiovascular:     Rate and Rhythm: Normal rate and regular rhythm.     Heart sounds: Normal heart sounds.  Pulmonary:     Effort: Pulmonary effort is normal. No respiratory distress.     Breath sounds: Normal breath sounds.  Abdominal:     General: Abdomen is flat. Bowel sounds are normal. There is no distension.     Palpations: There is no mass.     Tenderness: There is no abdominal tenderness.     Comments: No suprapubic tenderness or fullness    Genitourinary:    Labia:        Right: No rash, tenderness or lesion.        Left: No rash, tenderness or lesion.      Vagina: No erythema, tenderness, bleeding or lesions.  Musculoskeletal:     Cervical back: Normal range of motion and neck supple.  Lymphadenopathy:     Cervical: No cervical adenopathy.  Skin:     Coloration: Skin is not pale.     Findings: No erythema or rash.  Neurological:     Mental Status: She is alert.          Assessment & Plan:   Problem List Items Addressed This Visit       Genitourinary   Vaginitis    With clue cells on wet prep  Disc tx for BV Flagyl 500 mg bid for 7d  Probiotic prn  Update if not starting to improve in a week or if worsening         Relevant Orders   POCT Wet Prep [facility]) (Completed)

## 2021-01-20 NOTE — Patient Instructions (Signed)
For bacterial vaginosis take the flagyl as directed   If no improvement let us know  Continue probiotics

## 2021-01-22 NOTE — Assessment & Plan Note (Signed)
With clue cells on wet prep  Disc tx for BV Flagyl 500 mg bid for 7d  Probiotic prn  Update if not starting to improve in a week or if worsening

## 2021-01-30 ENCOUNTER — Telehealth: Payer: Self-pay

## 2021-01-30 NOTE — Telephone Encounter (Signed)
Pt was in on 01/20/21 for vaginitis and got medication. She states that she finished the meds, but around middle of last week her eyelids became inflamed, itchy and felt "gritty". She isn't sure if this is related to the medication or not. Pt says she has tried washing eye lids with baby shampoo but has not gotten any relief. Please advise.

## 2021-01-30 NOTE — Telephone Encounter (Signed)
It certainly could be a rxn to flagyl so we want to watch out for that in the future. Avoid sun exp on eyelids.  Avoid cleansers and just use water gently. Some vaseline or aquaphor sparingly is ok  Keep me posted Oddly-this can also happened with a nail polish allergy so if anything is new re: manicure or pedicure make note of it also

## 2021-01-30 NOTE — Telephone Encounter (Signed)
Pt notified of Dr. Marliss Coots comments. Pt said she did get a pedicure Thursday but her eyes have been itching since Tuesday (before pedicure), pt said she hasn't used anything new and is already doing everything PCP is recommending. She isn't wearing any new makeup or anything new facial cleaners. Pt said her last dose of the med was Saturday morning but her eyes are still bothering her. Pt said she's been using benadryl every 6 hrs and that's the only thing that is helping with the itching but she has to work tomorrow and isn't sure what to do. Pt said her eyelids are swollen and itching but her eyes are not red. Please advise

## 2021-01-30 NOTE — Telephone Encounter (Signed)
Try adding zyrtec 10 mg daily if she can  Cool compresses as well  If severe before visit-go to UC or ER  If any mouth/throat swelling-ER

## 2021-01-31 NOTE — Telephone Encounter (Signed)
the patient aware of recommendations.   Nothing further needed.

## 2021-01-31 NOTE — Telephone Encounter (Signed)
Left VM requesting pt to call the office back 

## 2021-02-01 ENCOUNTER — Encounter: Payer: Self-pay | Admitting: Dermatology

## 2021-02-02 NOTE — Addendum Note (Signed)
Addended by: Alfonso Patten on: 02/02/2021 02:07 PM   Modules accepted: Level of Service

## 2021-03-14 ENCOUNTER — Telehealth: Payer: Self-pay

## 2021-03-14 NOTE — Telephone Encounter (Signed)
Attempt made to contact Makayla [name] is a 60 y.o. female re: New pt Pre appt call to collect history information.  -Allergy -Medication -Confirm pharmacy -OB history   Pt was not available. LM on the VM for the patient to call back

## 2021-03-17 ENCOUNTER — Other Ambulatory Visit: Payer: Self-pay

## 2021-03-17 ENCOUNTER — Encounter: Payer: Self-pay | Admitting: Obstetrics and Gynecology

## 2021-03-17 ENCOUNTER — Ambulatory Visit (INDEPENDENT_AMBULATORY_CARE_PROVIDER_SITE_OTHER): Payer: Managed Care, Other (non HMO) | Admitting: Obstetrics and Gynecology

## 2021-03-17 VITALS — BP 121/81 | HR 85 | Ht 63.0 in | Wt 212.0 lb

## 2021-03-17 DIAGNOSIS — N393 Stress incontinence (female) (male): Secondary | ICD-10-CM

## 2021-03-17 DIAGNOSIS — R3915 Urgency of urination: Secondary | ICD-10-CM

## 2021-03-17 DIAGNOSIS — R350 Frequency of micturition: Secondary | ICD-10-CM

## 2021-03-17 DIAGNOSIS — N816 Rectocele: Secondary | ICD-10-CM

## 2021-03-17 LAB — POCT URINALYSIS DIPSTICK
Appearance: NORMAL
Bilirubin, UA: NEGATIVE
Blood, UA: NEGATIVE
Glucose, UA: NEGATIVE
Ketones, UA: NEGATIVE
Nitrite, UA: NEGATIVE
Protein, UA: NEGATIVE
Spec Grav, UA: 1.01 (ref 1.010–1.025)
Urobilinogen, UA: 0.2 E.U./dL
pH, UA: 5.5 (ref 5.0–8.0)

## 2021-03-17 NOTE — Progress Notes (Signed)
[facility] New Patient Evaluation and Consultation  Referring Provider: Tower, Wynelle Fanny, MD PCP: Abner Greenspan, MD Date of Service: 03/17/2021  SUBJECTIVE Chief Complaint: New Patient (Initial Visit) Makayla [name] is a 60 y.o. female here for a consult on rectocele.)  History of Present Illness: Makayla [name] is a 60 y.o. White or Caucasian female presenting for evaluation of prolapse.    Urinary Symptoms: Leaks urine with cough/ sneeze, with movement to the bathroom, and without sensation Unsure how often she leaks- notices some on her pad. Doesn't really have the sensation she leaks.  Pad use: 2 liners/ mini-pads per day.   She is bothered by her UI symptoms. S/p SPARC retropubic sling in 2012  Day time voids 7-8.  Nocturia: 3-4 times per night to void. Voiding dysfunction: she does not empty her bladder well.  does not use a catheter to empty bladder.  When urinating, she feels dribbling after finishing Drinks: 2 cups coffee, otherwise water  UTIs:  0  UTI's in the last year.   Denies history of blood in urine and kidney or bladder stones  Pelvic Organ Prolapse Symptoms:                  She Admits to a feeling of a bulge the vaginal area. It has been present since 2012- about 2 weeks after the surgery. Feels that it is getting progressively worse She Admits to seeing a bulge.  This bulge is bothersome.  She is s/p robotic sacrocolpopexy in 2012 with Alyte Y-shaped mesh graft   Bowel Symptom: Bowel movements: 1 time(s) per day Stool consistency: soft  Straining: no.  Splinting: no.  Incomplete evacuation: no.  She Denies accidental bowel leakage / fecal incontinence Bowel regimen: none Last colonoscopy: Date 2022, Results - normal  Sexual Function Sexually active: no.   Pelvic Pain Denies pelvic pain  Past Medical History:  Past Medical History:  Diagnosis Date   Allergy    pollen   Anxiety    Bursitis, trochanteric    bi/lat    Dental crowns  present    x 4   Diverticulosis    GERD (gastroesophageal reflux disease)    Hyperlipidemia    no current med.   Hypertension    white coat syndrome,   Mass of breast, left 01/2012   Obesity      Past Surgical History:   Past Surgical History:  Procedure Laterality Date   ABDOMINAL HYSTERECTOMY     partial   BREAST EXCISIONAL BIOPSY Left 2013   BREAST SURGERY  01/10/12   lumpectomy - left   CHOLECYSTECTOMY N/A 02/10/2016   Procedure: LAPAROSCOPIC CHOLECYSTECTOMY WITH INTRAOPERATIVE CHOLANGIOGRAM;  Surgeon: Autumn Messing III, MD;  Location: [facility];  Service: General;  Laterality: N/A;   COLONOSCOPY  04/06/2010   D Brodie, normal w/tics   RECTOCELE REPAIR  06/14/2011   cystocele repair, pt states Dr Vikki Ports did not do rectocele and said she had to come back later.   ROBOTIC ASSISTED LAPAROSCOPIC SACROCOLPOPEXY  06/14/2011   Procedure: ROBOTIC ASSISTED LAPAROSCOPIC SACROCOLPOPEXY;  Surgeon: Dutch Gray, MD;  Location: [facility];  Service: Urology;  Laterality: N/A;   TUBAL LIGATION       Past OB/GYN History: G1 P1 Vaginal deliveries: 1,  Forceps/ Vacuum deliveries: 0, Cesarean section: 0 S/p hysterectomy for fibroids   Medications: She has a current medication list which includes the following prescription(s): amlodipine, b complex vitamins, esomeprazole, fluticasone, hydroxyzine, ibuprofen, sertraline, vitamin d (  cholecalciferol), and vitamin e, and the following Facility-Administered Medications: sodium chloride.   Allergies: Patient is allergic to buspar [buspirone].   Social History:  Social History   Tobacco Use   Smoking status: Never   Smokeless tobacco: Never  Vaping Use   Vaping Use: Never used  Substance Use Topics   Alcohol use: No    Alcohol/week: 0.0 standard drinks   Drug use: No    Relationship status: widowed She is employed as a Marine scientist. Regular exercise: No History of abuse: No  Family History:   Family History  Problem Relation Age of Onset    Hypertension Mother    Hyperlipidemia Mother    Diabetes Mother    Breast cancer Mother 39   Uterine cancer Mother    GER disease Father    Depression Sister    Heart disease Brother    Heart attack Maternal Grandmother    Hypertension Maternal Grandmother    Colon cancer Neg Hx    Colon polyps Neg Hx    Esophageal cancer Neg Hx    Rectal cancer Neg Hx    Stomach cancer Neg Hx      Review of Systems: Review of Systems  Constitutional:  Negative for fever, malaise/fatigue and weight loss.  Respiratory:  Negative for cough, shortness of breath and wheezing.   Cardiovascular:  Negative for chest pain, palpitations and leg swelling.  Gastrointestinal:  Negative for abdominal pain and blood in stool.  Genitourinary:  Negative for dysuria.  Musculoskeletal:  Negative for myalgias.  Skin:  Negative for rash.  Neurological:  Negative for dizziness and headaches.  Endo/Heme/Allergies:  Does not bruise/bleed easily.  Psychiatric/Behavioral:  Negative for depression. The patient is not nervous/anxious.     OBJECTIVE Physical Exam: Vitals:   03/17/21 0846  BP: 121/81  Pulse: 85  Weight: 212 lb (96.2 kg)  Height: '5\' 3"'$  (1.6 m)    Physical Exam Constitutional:      General: She is not in acute distress. Pulmonary:     Effort: Pulmonary effort is normal.  Abdominal:     General: There is no distension.     Palpations: Abdomen is soft.     Tenderness: There is no abdominal tenderness. There is no rebound.  Musculoskeletal:        General: No swelling. Normal range of motion.  Skin:    General: Skin is warm and dry.     Findings: No rash.  Neurological:     Mental Status: She is alert and oriented to person, place, and time.  Psychiatric:        Mood and Affect: Mood normal.        Behavior: Behavior normal.     GU / Detailed Urogynecologic Evaluation:  Pelvic Exam: Normal external female genitalia; Bartholin's and Skene's glands normal in appearance; urethral meatus  normal in appearance, no urethral masses or discharge.   CST: negative  s/p hysterectomy: Speculum exam reveals normal vaginal mucosa with  atrophy and normal vaginal cuff.  Adnexa no mass, fullness, tenderness.  No evidence of mesh exposure.   Pelvic floor strength II/V  Pelvic floor musculature: Right levator non-tender, Right obturator non-tender, Left levator non-tender, Left obturator non-tender  POP-Q:   POP-Q  -3                                            Aa   -  3                                           Ba  -9                                              C   6.5                                            Gh  5                                            Pb  9.5                                            tvl   -3                                            Ap  3                                            Bp                                                 D     Rectal Exam:  Normal sphincter tone,large distal rectocele, enterocoele not present, no rectal masses  Post-Void Residual (PVR) by Bladder Scan: In order to evaluate bladder emptying, we discussed obtaining a postvoid residual and she agreed to this procedure.  Procedure: The ultrasound unit was placed on the patient's abdomen in the suprapubic region after the patient had voided. A PVR of 4 ml was obtained by bladder scan.  Laboratory Results: POC urine: negative   ASSESSMENT AND PLAN Ms. Arcement is a 59 y.o. with:  1. Prolapse of posterior vaginal wall   2. Urinary frequency   3. SUI (stress urinary incontinence, female)   4. Urinary urgency     Stage 0 anterior, Stage III posterior, Stage 0 apical prolapse -For treatment of pelvic organ prolapse, we discussed options for management including expectant management, conservative management, and surgical management, such as Kegels, a pessary, pelvic floor physical therapy, and specific surgical procedures. - We discussed surgical options of vaginal  repair vs robotic with mesh. We reviewed that since she has had a mesh sacrocolpopexy procedure previously, I would not know exactly what needs to be accomplished until the surgery. Its possible that the mesh was not placed far enough down on the posterior vagina, or that the mesh detached from the posterior vagina. The anterior and  apical mesh appears to be supporting well.  - She is unsure on what she would like to do but will consider her options. If she opts for surgery, would recommend urodynamic testing prior.   2. SUI - she is not always aware when she leaks, unclear if this is SUI or  urge incontinence - recommend urodynamic testing due to history of retropubic sling  3. Urinary urgency/ frequency - Patient feels this is due to water intake. If more bothersome, could start a medication.   She will notify the office on which route she decides.   Jaquita Folds, MD   Time spent: I spent 60 minutes dedicated to the care of this patient on the date of this encounter to include pre-visit review of records, face-to-face time with the patient discussing surgical options and post visit documentation.

## 2021-03-17 NOTE — Patient Instructions (Signed)
You have a stage 3 (out of 4) prolapse.  We discussed the fact that it is not life threatening but there are several treatment options. For treatment of pelvic organ prolapse, we discussed options for management including expectant management, conservative management, and surgical management, such as Kegels, a pessary, pelvic floor physical therapy, and specific surgical procedures.      

## 2021-03-29 ENCOUNTER — Other Ambulatory Visit: Payer: Self-pay | Admitting: Family Medicine

## 2021-03-29 ENCOUNTER — Encounter: Payer: Self-pay | Admitting: Family Medicine

## 2021-03-29 MED ORDER — FLUTICASONE PROPIONATE 50 MCG/ACT NA SUSP: 2.0000 | Freq: Every day | NASAL | 0 refills | Status: DC | PRN

## 2021-04-04 ENCOUNTER — Ambulatory Visit
Admission: EM | Admit: 2021-04-04 | Discharge: 2021-04-04 | Disposition: A | Discharge status: Home / Self Care | Payer: Managed Care, Other (non HMO) | Attending: Internal Medicine | Admitting: Internal Medicine

## 2021-04-04 ENCOUNTER — Other Ambulatory Visit: Payer: Self-pay

## 2021-04-04 ENCOUNTER — Encounter: Payer: Self-pay | Admitting: Emergency Medicine

## 2021-04-04 DIAGNOSIS — L089 Local infection of the skin and subcutaneous tissue, unspecified: Secondary | ICD-10-CM | POA: Diagnosis not present

## 2021-04-04 MED ORDER — CEPHALEXIN 500 MG PO CAPS: ORAL_CAPSULE | ORAL | 0 refills | Status: DC

## 2021-04-04 NOTE — ED Triage Notes (Signed)
Approx 4 weeks ago started having intermittent episodes oif dry, itchy anjd now painful tissue to left little finger, around and under nail

## 2021-04-04 NOTE — ED Provider Notes (Signed)
Makayla [name]    CSN: [id number] Arrival date & time: 04/04/21  1759      History   Chief Complaint Chief Complaint  Patient presents with   Skin Problem    HPI Makayla [name] is a 60 y.o. female who had a manicure 4 weeks ago and her L small finger developed a rash, and today became painful. Last night was hot. She has never had a manicure before. Today she noticed a scab under the nail area.     Past Medical History:  Diagnosis Date   Allergy    pollen   Anxiety    Bursitis, trochanteric    bi/lat    Dental crowns present    x 4   Diverticulosis    GERD (gastroesophageal reflux disease)    Hyperlipidemia    no current med.   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   Retro-orbital pain of left eye 03/14/2020   Left temporal headache 03/14/2020   Headache 02/25/2020   Pain head 02/25/2020   Itching 01/08/2020   Pulsatile tinnitus of right ear 09/30/2019   Immunization reaction 09/08/2019   Tachycardia 10/03/2018   Grief reaction 10/03/2018   Elevated glucose level 01/16/2018   Generalized anxiety disorder 01/23/2017   Chest pain 11/02/2016   Left knee pain 09/13/2015   Vitamin D deficiency 05/20/2015   Obesity 05/20/2015   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   Routine general medical examination at a health care facility 10/16/2011   Hyperlipidemia LDL goal <130 12/04/2007   GERD 12/04/2007   Essential hypertension 12/04/2007    Past Surgical History:  Procedure Laterality Date   ABDOMINAL HYSTERECTOMY     partial   BREAST EXCISIONAL BIOPSY Left 2013   BREAST SURGERY  01/10/12    lumpectomy - left   CHOLECYSTECTOMY N/A 02/10/2016   Procedure: LAPAROSCOPIC CHOLECYSTECTOMY WITH INTRAOPERATIVE CHOLANGIOGRAM;  Surgeon: Autumn Messing III, MD;  Location: [facility];  Service: General;  Laterality: N/A;   COLONOSCOPY  04/06/2010   D Brodie, normal w/tics   RECTOCELE REPAIR  06/14/2011   cystocele repair, pt states Dr Vikki Ports did not do rectocele and said she had to come back later.   ROBOTIC ASSISTED LAPAROSCOPIC SACROCOLPOPEXY  06/14/2011   Procedure: ROBOTIC ASSISTED LAPAROSCOPIC SACROCOLPOPEXY;  Surgeon: Dutch Gray, MD;  Location: [facility];  Service: Urology;  Laterality: N/A;   TUBAL LIGATION      OB History     Gravida  1   Para      Term      Preterm      AB      Living  1      SAB      IAB      Ectopic      Multiple      Live Births  1            Home Medications    Prior to Admission medications   Medication Sig Start Date End Date Taking? Authorizing Provider  amLODipine (NORVASC) 2.5 MG tablet Take 1 tablet (2.5 mg total) by mouth daily. 05/06/20  Tower, Marne A, MD  b complex vitamins capsule Take 1 capsule by mouth daily.    [provider]  esomeprazole (NEXIUM) 20 MG capsule Take 20 mg by mouth daily before breakfast.     [provider]  fluticasone (FLONASE) 50 MCG/ACT nasal spray Place 2 sprays into both nostrils daily as needed. 03/29/21   Tower, Wynelle Fanny, MD  hydrOXYzine (ATARAX/VISTARIL) 25 MG tablet Take by mouth. 03/14/20   [provider]  ibuprofen (ADVIL) 800 MG tablet TAKE 1 TABLET BY MOUTH EVERY 8 HOURS AS NEEDED FOR PAIN 05/25/19   Tower, Wynelle Fanny, MD  sertraline (ZOLOFT) 25 MG tablet TAKE ONE TABLET BY MOUTH ONE TIME DAILY 03/31/21   Tower, Wynelle Fanny, MD  Vitamin D, Cholecalciferol, 25 MCG (1000 UT) TABS Take 5,000 Units by mouth daily.    [provider]  Vitamin E 200 units TABS Take by mouth.    [provider]    Family History Family History  Problem Relation Age of Onset    Hypertension Mother    Hyperlipidemia Mother    Diabetes Mother    Breast cancer Mother 18   Uterine cancer Mother    GER disease Father    Depression Sister    Heart disease Brother    Heart attack Maternal Grandmother    Hypertension Maternal Grandmother    Colon cancer Neg Hx    Colon polyps Neg Hx    Esophageal cancer Neg Hx    Rectal cancer Neg Hx    Stomach cancer Neg Hx     Social History Social History   Tobacco Use   Smoking status: Never   Smokeless tobacco: Never  Vaping Use   Vaping Use: Never used  Substance Use Topics   Alcohol use: No    Alcohol/week: 0.0 standard drinks   Drug use: No     Allergies   Buspar [buspirone]   Review of Systems Review of Systems  Skin:  Positive for rash and wound. Negative for color change and pallor.    Physical Exam Triage Vital Signs ED Triage Vitals  Enc Vitals Group     BP 04/04/21 1906 128/75     Pulse Rate 04/04/21 1906 76     Resp 04/04/21 1906 20     Temp 04/04/21 1906 98.2 F (36.8 C)     Temp Source 04/04/21 1906 Oral     SpO2 04/04/21 1906 99 %     Weight --      Height --      Head Circumference --      Peak Flow --      Pain Score 04/04/21 1854 3     Pain Loc --      Pain Edu? --      Excl. in [place]? --    No data found.  Updated Vital Signs BP 128/75 (BP Location: Left Arm) Comment (BP Location): large cuff  Pulse 76   Temp 98.2 F (36.8 C) (Oral)   Resp 20   SpO2 99%   Visual Acuity Right Eye Distance:   Left Eye Distance:   Bilateral Distance:    Right Eye Near:   Left Eye Near:    Bilateral Near:     Physical Exam Vitals and nursing note reviewed.  Constitutional:      General: She is not in acute distress.    Appearance: She is obese. She is not toxic-appearing.  HENT:     Head: Normocephalic.  Right Ear: External ear normal.     Left Ear: External ear normal.  Eyes:     General: No scleral icterus.    Conjunctiva/sclera: Conjunctivae normal.  Pulmonary:      Effort: Pulmonary effort is normal.  Musculoskeletal:        General: Normal range of motion.     Cervical back: Neck supple.  Skin:    General: Skin is warm and dry.     Capillary Refill: Capillary refill takes less than 2 seconds.     Comments: L small finger- has honey crusting on lateral border and a small scab under the nail distally. Is a little warm, but not red. No active oozing present.   Neurological:     Mental Status: She is alert and oriented to person, place, and time.     Gait: Gait normal.  Psychiatric:        Mood and Affect: Mood normal.        Behavior: Behavior normal.        Thought Content: Thought content normal.        Judgment: Judgment normal.     UC Treatments / Results  Labs (all labs ordered are listed, but only abnormal results are displayed) Labs Reviewed - No data to display  EKG   Radiology No results found.  Procedures Procedures (including critical care time)  Medications Ordered in UC Medications - No data to display  Initial Impression / Assessment and Plan / UC Course  I have reviewed the triage vital signs and the nursing notes. L finger infection. I placed her on Keflex as noted. Advised to soak it in Epson salts for a few days.      Final Clinical Impressions(s) / UC Diagnoses   Final diagnoses:  None   Discharge Instructions   None    ED Prescriptions   None    PDMP not reviewed this encounter.   Shelby Mattocks, [place] 04/04/21 1921

## 2021-04-05 ENCOUNTER — Ambulatory Visit: Payer: Managed Care, Other (non HMO)

## 2021-04-12 ENCOUNTER — Other Ambulatory Visit: Payer: Self-pay | Admitting: Primary Care

## 2021-04-12 DIAGNOSIS — T3695XA Adverse effect of unspecified systemic antibiotic, initial encounter: Secondary | ICD-10-CM

## 2021-04-12 DIAGNOSIS — B379 Candidiasis, unspecified: Secondary | ICD-10-CM

## 2021-04-13 NOTE — Telephone Encounter (Signed)
Wants diflucan. Yeast infection? What symptoms?

## 2021-04-18 NOTE — Telephone Encounter (Signed)
From patient through mychart "No symptoms. Was started on Keflex from Urgent care visit due to infection in the cuticle of one of my fingers. Wanted to have in case I developed a yeast infection from the antibiotic. I will reach out to you if I start to develop any symptoms. Thank you. "

## 2021-04-18 NOTE — Telephone Encounter (Signed)
See refill request note in regards to this

## 2021-04-18 NOTE — Telephone Encounter (Signed)
Left message for patient to call back and sent mychart message.

## 2021-05-03 ENCOUNTER — Ambulatory Visit: Payer: Managed Care, Other (non HMO) | Admitting: Dermatology

## 2021-05-03 ENCOUNTER — Other Ambulatory Visit: Payer: Self-pay

## 2021-05-03 DIAGNOSIS — L601 Onycholysis: Secondary | ICD-10-CM

## 2021-05-03 DIAGNOSIS — Z872 Personal history of diseases of the skin and subcutaneous tissue: Secondary | ICD-10-CM

## 2021-05-03 DIAGNOSIS — L03011 Cellulitis of right finger: Secondary | ICD-10-CM

## 2021-05-03 DIAGNOSIS — L609 Nail disorder, unspecified: Secondary | ICD-10-CM

## 2021-05-03 DIAGNOSIS — L03012 Cellulitis of left finger: Secondary | ICD-10-CM | POA: Diagnosis not present

## 2021-05-03 MED ORDER — KETOCONAZOLE 2 % EX CREA: TOPICAL_CREAM | CUTANEOUS | 0 refills | Status: DC

## 2021-05-03 NOTE — Progress Notes (Signed)
   Follow-Up Visit   Subjective  Makayla [name] is a 60 y.o. female who presents for the following: Follow-up (Patient here today for 3 month AK follow up at left forehead. Patient advises that spot never went away for LN2. She also had an ISK treated at right forehead which has cleared up. Patient is concerned about areas at both pinky fingers after having a manicure about 2 months ago. She said nail tech nicked the cuticle at left pinky and the tip of right pinky and nails have not been normal since. Patient did go to urgent care and they suggested epsom salt soaks. ).  The following portions of the chart were reviewed this encounter and updated as appropriate:   Tobacco  Allergies  Meds  Problems  Med Hx  Surg Hx  Fam Hx      Review of Systems:  No other skin or systemic complaints except as noted in HPI or Assessment and Plan.  Objective  Well appearing patient in no apparent distress; mood and affect are within normal limits.  A full examination was performed including scalp, head, eyes, ears, nose, lips, neck, chest, axillae, abdomen, back, buttocks, bilateral upper extremities, bilateral lower extremities, hands, feet, fingers, toes, fingernails, and toenails. All findings within normal limits unless otherwise noted below.  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   Start ketoconazole 2% cream 1-2 times daily as needed to help with chronic paronychia  Patient advised to clip back white portion of nail tip, try and avoid wet work/use gloves when washing dishes or cleaning, avoid scraping under nail, and use ketoconazole cream daily to bid to tip of nail as well  If not improving will plan to do fungal culture of nail  ketoconazole (NIZORAL) 2 % cream - bilateral 5th  finger Apply to affected fingernails 1-2 times daily as needed  History of PreCancerous Actinic Keratosis  - site(s) of PreCancerous Actinic Keratosis clear today. - these may recur and new lesions may form requiring treatment to prevent transformation into skin cancer - observe for new or changing spots and contact [facility] for appointment if occur - photoprotection with sun protective clothing; sunglasses and broad spectrum sunscreen with SPF of at least 30 + and frequent self skin exams recommended - yearly exams by a dermatologist recommended for persons with history of PreCancerous Actinic Keratoses  Return for TBSE, as scheduled.  Graciella Belton, RMA, am acting as scribe for Forest Gleason, MD .  Documentation: I have reviewed the above documentation for accuracy and completeness, and I agree with the above.  Forest Gleason, MD

## 2021-05-03 NOTE — Patient Instructions (Signed)

## 2021-05-04 ENCOUNTER — Encounter: Payer: Self-pay | Admitting: Dermatology

## 2021-05-05 ENCOUNTER — Ambulatory Visit: Payer: Managed Care, Other (non HMO) | Admitting: Family Medicine

## 2021-05-12 ENCOUNTER — Other Ambulatory Visit: Payer: Self-pay | Admitting: Family Medicine

## 2021-05-12 DIAGNOSIS — Z1231 Encounter for screening mammogram for malignant neoplasm of breast: Secondary | ICD-10-CM

## 2021-05-13 ENCOUNTER — Emergency Department
Admission: EM | Admit: 2021-05-13 | Discharge: 2021-05-13 | Disposition: A | Discharge status: Home / Self Care | Payer: Managed Care, Other (non HMO) | Attending: Emergency Medicine | Admitting: Emergency Medicine

## 2021-05-13 ENCOUNTER — Other Ambulatory Visit: Payer: Self-pay

## 2021-05-13 ENCOUNTER — Ambulatory Visit: Payer: Managed Care, Other (non HMO)

## 2021-05-13 ENCOUNTER — Emergency Department: Payer: Managed Care, Other (non HMO)

## 2021-05-13 DIAGNOSIS — R110 Nausea: Secondary | ICD-10-CM | POA: Insufficient documentation

## 2021-05-13 DIAGNOSIS — I10 Essential (primary) hypertension: Secondary | ICD-10-CM | POA: Diagnosis not present

## 2021-05-13 DIAGNOSIS — R1032 Left lower quadrant pain: Secondary | ICD-10-CM | POA: Insufficient documentation

## 2021-05-13 DIAGNOSIS — R1012 Left upper quadrant pain: Secondary | ICD-10-CM

## 2021-05-13 DIAGNOSIS — Z79899 Other long term (current) drug therapy: Secondary | ICD-10-CM | POA: Insufficient documentation

## 2021-05-13 LAB — URINALYSIS, COMPLETE (UACMP) WITH MICROSCOPIC
Bacteria, UA: NONE SEEN
Bilirubin Urine: NEGATIVE
Glucose, UA: NEGATIVE mg/dL
Hgb urine dipstick: NEGATIVE
Ketones, ur: 5 mg/dL — AB
Leukocytes,Ua: NEGATIVE
Nitrite: NEGATIVE
Protein, ur: NEGATIVE mg/dL
Specific Gravity, Urine: >1.046 — ABNORMAL HIGH (ref 1.005–1.030)
Squamous Epithelial / HPF: >50 — ABNORMAL HIGH (ref 0–5)
pH: 5 (ref 5.0–8.0)

## 2021-05-13 LAB — CBC WITH DIFFERENTIAL/PLATELET
Abs Immature Granulocytes: 0.02 10*3/uL (ref 0.00–0.07)
Basophils Absolute: 0.1 10*3/uL (ref 0.0–0.1)
Basophils Relative: 1 %
Eosinophils Absolute: 0.1 10*3/uL (ref 0.0–0.5)
Eosinophils Relative: 1 %
HCT: 45.8 % (ref 36.0–46.0)
Hemoglobin: 15 g/dL (ref 12.0–15.0)
Immature Granulocytes: 0 %
Lymphocytes Relative: 18 %
Lymphs Abs: 1.8 10*3/uL (ref 0.7–4.0)
MCH: 27.1 pg (ref 26.0–34.0)
MCHC: 32.8 g/dL (ref 30.0–36.0)
MCV: 82.7 fL (ref 80.0–100.0)
Monocytes Absolute: 0.7 10*3/uL (ref 0.1–1.0)
Monocytes Relative: 7 %
Neutro Abs: 7.1 10*3/uL (ref 1.7–7.7)
Neutrophils Relative %: 73 %
Platelets: 156 10*3/uL (ref 150–400)
RBC: 5.54 MIL/uL — ABNORMAL HIGH (ref 3.87–5.11)
RDW: 14.7 % (ref 11.5–15.5)
WBC: 9.7 10*3/uL (ref 4.0–10.5)
nRBC: 0 % (ref 0.0–0.2)

## 2021-05-13 LAB — HEPATIC FUNCTION PANEL
ALT: 22 U/L (ref 0–44)
AST: 19 U/L (ref 15–41)
Albumin: 4.1 g/dL (ref 3.5–5.0)
Alkaline Phosphatase: 89 U/L (ref 38–126)
Bilirubin, Direct: <0.1 mg/dL (ref 0.0–0.2)
Total Bilirubin: 0.6 mg/dL (ref 0.3–1.2)
Total Protein: 7.3 g/dL (ref 6.5–8.1)

## 2021-05-13 LAB — BASIC METABOLIC PANEL
Anion gap: 7 (ref 5–15)
BUN: 11 mg/dL (ref 8–23)
CO2: 25 mmol/L (ref 22–32)
Calcium: 9.5 mg/dL (ref 8.9–10.3)
Chloride: 104 mmol/L (ref 98–111)
Creatinine, Ser: 0.87 mg/dL (ref 0.44–1.00)
GFR, Estimated: >60 mL/min (ref >60)
Glucose, Bld: 132 mg/dL — ABNORMAL HIGH (ref 70–99)
Potassium: 3.9 mmol/L (ref 3.5–5.1)
Sodium: 136 mmol/L (ref 135–145)

## 2021-05-13 LAB — LIPASE, BLOOD: Lipase: 29 U/L (ref 11–51)

## 2021-05-13 MED ORDER — IOHEXOL 350 MG/ML SOLN
100.0000 mL | Freq: Once | INTRAVENOUS | Status: AC | PRN
  Administered 2021-05-13: 100 mL via INTRAVENOUS

## 2021-05-13 MED ORDER — SUCRALFATE 1 G PO TABS: 1.0000 g | ORAL_TABLET | Freq: Four times a day (QID) | ORAL | 0 refills | Status: DC | PRN

## 2021-05-13 NOTE — ED Notes (Addendum)
Pt walked to treatment room with steady gait, NAD noted

## 2021-05-13 NOTE — ED Provider Notes (Addendum)
[facility] Emergency Department Provider Note ____________________________________________   Event Date/Time   First MD Initiated Contact with Patient 05/13/21 [phone]     (approximate)  I have reviewed the triage vital signs and the nursing notes.   HISTORY  Chief Complaint Abdominal Pain    HPI Makayla [name] is a 60 y.o. female with PMH as noted below including a history of diverticulitis who presents with left mid abdominal pain over the last 2 days, persistent course, associated with nausea but no vomiting, and not associated with any urinary symptoms, change in her bowel movements, or bloody stool.  The pain radiates to her flank and back.  She states it initially felt somewhat like when she had diverticulitis previously, but the radiation to the back is somewhat different.  Past Medical History:  Diagnosis Date   Allergy    pollen   Anxiety    Bursitis, trochanteric    bi/lat    Dental crowns present    x 4   Diverticulosis    GERD (gastroesophageal reflux disease)    Hyperlipidemia    no current med.   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   Retro-orbital pain of left eye 03/14/2020   Left temporal headache 03/14/2020   Headache 02/25/2020   Pain head 02/25/2020   Itching 01/08/2020   Pulsatile tinnitus of right ear 09/30/2019   Immunization reaction 09/08/2019   Tachycardia 10/03/2018   Grief reaction 10/03/2018   Elevated glucose level 01/16/2018   Generalized anxiety disorder 01/23/2017   Chest pain 11/02/2016   Left knee pain 09/13/2015   Vitamin D deficiency 05/20/2015   Obesity  05/20/2015   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   Routine general medical examination at a health care facility 10/16/2011   Hyperlipidemia LDL goal <130 12/04/2007   GERD 12/04/2007   Essential hypertension 12/04/2007    Past Surgical History:  Procedure Laterality Date   ABDOMINAL HYSTERECTOMY     partial   BREAST EXCISIONAL BIOPSY Left 2013   BREAST SURGERY  01/10/12   lumpectomy - left   CHOLECYSTECTOMY N/A 02/10/2016   Procedure: LAPAROSCOPIC CHOLECYSTECTOMY WITH INTRAOPERATIVE CHOLANGIOGRAM;  Surgeon: Autumn Messing III, MD;  Location: [facility];  Service: General;  Laterality: N/A;   COLONOSCOPY  04/06/2010   D Brodie, normal w/tics   RECTOCELE REPAIR  06/14/2011   cystocele repair, pt states Dr Vikki Ports did not do rectocele and said she had to come back later.   ROBOTIC ASSISTED LAPAROSCOPIC SACROCOLPOPEXY  06/14/2011   Procedure: ROBOTIC ASSISTED LAPAROSCOPIC SACROCOLPOPEXY;  Surgeon: Dutch Gray, MD;  Location: [facility];  Service: Urology;  Laterality: N/A;   TUBAL LIGATION      Prior to Admission medications   Medication Sig Start Date End Date Taking? Authorizing Provider  sucralfate (CARAFATE) 1 g tablet Take 1 tablet (1 g total) by mouth 4 (four) times daily as needed. 05/13/21 05/13/22 Yes Arta Silence, MD  amLODipine (NORVASC) 2.5 MG tablet Take 1 tablet (2.5 mg total) by mouth daily. 05/06/20   Tower, Wynelle Fanny, MD  b complex vitamins capsule Take 1 capsule by  mouth daily.    [provider]  cephALEXin (KEFLEX) 500 MG capsule 2 bid x 7 days 04/04/21   Rodriguez-Southworth, Sunday Spillers, PA-C  esomeprazole (NEXIUM) 20 MG capsule Take 20 mg by mouth daily before breakfast.     [provider]  fluconazole (DIFLUCAN) 150 MG tablet TAKE 1 TABLET BY MOUTH FOR ONE DOSE 04/18/21   Tower, Wynelle Fanny, MD  fluticasone (FLONASE) 50 MCG/ACT nasal spray Place 2 sprays into both nostrils daily as needed. 03/29/21   Tower, Wynelle Fanny, MD  hydrOXYzine  (ATARAX/VISTARIL) 25 MG tablet Take by mouth. 03/14/20   [provider]  ibuprofen (ADVIL) 800 MG tablet TAKE 1 TABLET BY MOUTH EVERY 8 HOURS AS NEEDED FOR PAIN 05/25/19   Tower, Wynelle Fanny, MD  ketoconazole (NIZORAL) 2 % cream Apply to affected fingernails 1-2 times daily as needed 05/03/21   [facility], [place], MD  sertraline (ZOLOFT) 25 MG tablet TAKE ONE TABLET BY MOUTH ONE TIME DAILY 03/31/21   Tower, Wynelle Fanny, MD  Vitamin D, Cholecalciferol, 25 MCG (1000 UT) TABS Take 5,000 Units by mouth daily.    [provider]  Vitamin E 200 units TABS Take by mouth.    [provider]    Allergies Buspar [buspirone]  Family History  Problem Relation Age of Onset   Hypertension Mother    Hyperlipidemia Mother    Diabetes Mother    Breast cancer Mother 51   Uterine cancer Mother    GER disease Father    Depression Sister    Heart disease Brother    Heart attack Maternal Grandmother    Hypertension Maternal Grandmother    Colon cancer Neg Hx    Colon polyps Neg Hx    Esophageal cancer Neg Hx    Rectal cancer Neg Hx    Stomach cancer Neg Hx     Social History Social History   Tobacco Use   Smoking status: Never   Smokeless tobacco: Never  Vaping Use   Vaping Use: Never used  Substance Use Topics   Alcohol use: No    Alcohol/week: 0.0 standard drinks   Drug use: No    Review of Systems  Constitutional: No fever/chills Eyes: No visual changes. ENT: No sore throat. Cardiovascular: Denies chest pain. Respiratory: Denies shortness of breath. Gastrointestinal: Positive for nausea and abdominal pain. Genitourinary: Negative for dysuria or hematuria.  Musculoskeletal: Negative for back pain. Skin: Negative for rash. Neurological: Negative for headaches, focal weakness or numbness.   ____________________________________________   PHYSICAL EXAM:  VITAL SIGNS: ED Triage Vitals  Enc Vitals Group     BP 05/13/21 0908 (!) 143/97     Pulse Rate 05/13/21 0908  (!) 101     Resp 05/13/21 0908 20     Temp 05/13/21 0908 98.1 F (36.7 C)     Temp Source 05/13/21 0908 Oral     SpO2 05/13/21 0908 96 %     Weight 05/13/21 0902 212 lb (96.2 kg)     Height 05/13/21 0902 5\' 3"  (1.6 m)     Head Circumference --      Peak Flow --      Pain Score 05/13/21 0902 5     Pain Loc --      Pain Edu? --      Excl. in [place]? --     Constitutional: Alert and oriented. Well appearing and in no acute distress. Eyes: Conjunctivae are normal.  Head: Atraumatic. Nose: No congestion/rhinnorhea. Mouth/Throat: Mucous membranes are moist.  Neck: Normal range of motion.  Cardiovascular: Normal rate, regular rhythm. Good peripheral circulation. Respiratory: Normal respiratory effort.  No retractions. Gastrointestinal: Soft with mild left mid abdominal tenderness.  No distention.  Genitourinary: No CVA tenderness. Musculoskeletal: Extremities warm and well perfused.  Neurologic:  Normal speech and language. No gross focal neurologic deficits are appreciated.  Skin:  Skin is warm and dry. No rash noted. Psychiatric: Mood and affect are normal. Speech and behavior are normal.  ____________________________________________   LABS (all labs ordered are listed, but only abnormal results are displayed)  Labs Reviewed  BASIC METABOLIC PANEL - Abnormal; Notable for the following components:      Result Value   Glucose, Bld 132 (*)    All other components within normal limits  CBC WITH DIFFERENTIAL/PLATELET - Abnormal; Notable for the following components:   RBC 5.54 (*)    All other components within normal limits  URINALYSIS, COMPLETE (UACMP) WITH MICROSCOPIC - Abnormal; Notable for the following components:   Color, Urine YELLOW (*)    APPearance CLOUDY (*)    Specific Gravity, Urine >1.046 (*)    Ketones, ur 5 (*)    Squamous Epithelial / LPF >50 (*)    All other components within normal limits  LIPASE, BLOOD  HEPATIC FUNCTION PANEL    ____________________________________________  EKG   ____________________________________________  RADIOLOGY  CT abdomen pelvis:  IMPRESSION:  1. No acute abnormality. No evidence of bowel obstruction or acute  bowel inflammation. Marked diffuse colonic diverticulosis, with no  evidence of acute diverticulitis.  2. Small chronic enterocele and rectocele.  3. Diffuse hepatic steatosis.  4. Small to moderate hiatal hernia.  5. Stable left adrenal adenoma.  6. Aortic Atherosclerosis (ICD10-I70.0).   ____________________________________________   PROCEDURES  Procedure(s) performed: No  Procedures  Critical Care performed: No ____________________________________________   INITIAL IMPRESSION / ASSESSMENT AND PLAN / ED COURSE  Pertinent labs & imaging results that were available during my care of the patient were reviewed by me and considered in my medical decision making (see chart for details).   60 year old female with PMH as noted above presents with left-sided abdominal pain over the last 2 days with nausea but no other associated symptoms.  On exam she is overall well-appearing.  Vital signs are normal.  The abdomen is soft with mild left mid abdominal tenderness.  Differential includes diverticulitis, colitis, UTI/pyelonephritis, kidney stone, gastritis, pancreatitis, other hepatobiliary cause.  I have a low suspicion for SBO or volvulus given the relatively reassuring exam.  We will obtain lab work-up, urinalysis, CT abdomen/pelvis and reassess.  ----------------------------------------- 4:02 PM on 05/13/2021 -----------------------------------------  CT is negative for acute findings.  Urinalysis is also negative.  The patient states that the pain is improved, and relatively mild at this time.  Based on the location in the left mid to upper abdomen, I suspect that the likely etiology is gastritis or PUD.  The patient is already on Nexium.  I will add on Carafate  and have advised her to eat a more bland diet and avoid foods that could trigger gastritis.    At this time, the patient is stable for discharge home.  I gave the patient thorough return precautions and she expressed understanding.   ____________________________________________   FINAL CLINICAL IMPRESSION(S) / ED DIAGNOSES  Final diagnoses:  Left upper quadrant abdominal pain      NEW MEDICATIONS STARTED DURING THIS VISIT:  New Prescriptions   SUCRALFATE (CARAFATE) 1 G TABLET    Take 1 tablet (  1 g total) by mouth 4 (four) times daily as needed.     Note:  This document was prepared using Dragon voice recognition software and may include unintentional dictation errors.    Arta Silence, MD 05/13/21 1558    Arta Silence, MD 05/13/21 [phone]

## 2021-05-13 NOTE — ED Triage Notes (Signed)
Patient c/o left upper abdominal pain that started Thursday 05/11/21

## 2021-05-13 NOTE — Discharge Instructions (Addendum)
Your abdominal pain may be caused by gastritis, or inflammation in the stomach lining.  Continue taking your Nexium.  You may take the Carafate on top of this for additional pain relief.  You should try to eat a more bland diet over the next few days.    Follow-up with your primary care doctor.    Return to the ER for new, worsening, or persistent severe abdominal pain, vomiting, fever, weakness, or any other new or worsening symptoms that concern you.

## 2021-05-16 ENCOUNTER — Encounter: Payer: Self-pay | Admitting: Gastroenterology

## 2021-05-19 ENCOUNTER — Other Ambulatory Visit: Payer: Managed Care, Other (non HMO)

## 2021-05-26 ENCOUNTER — Encounter: Payer: Managed Care, Other (non HMO) | Admitting: Family Medicine

## 2021-05-29 ENCOUNTER — Encounter: Payer: Self-pay | Admitting: Family Medicine

## 2021-05-29 ENCOUNTER — Other Ambulatory Visit: Payer: Self-pay

## 2021-05-29 ENCOUNTER — Encounter: Payer: Managed Care, Other (non HMO) | Admitting: Family Medicine

## 2021-05-29 ENCOUNTER — Ambulatory Visit (INDEPENDENT_AMBULATORY_CARE_PROVIDER_SITE_OTHER): Payer: Managed Care, Other (non HMO) | Admitting: Family Medicine

## 2021-05-29 VITALS — BP 126/82 | HR 92 | Temp 97.1°F | Ht 63.0 in | Wt 217.0 lb

## 2021-05-29 DIAGNOSIS — Z0000 Encounter for general adult medical examination without abnormal findings: Secondary | ICD-10-CM | POA: Diagnosis not present

## 2021-05-29 DIAGNOSIS — I10 Essential (primary) hypertension: Secondary | ICD-10-CM | POA: Diagnosis not present

## 2021-05-29 DIAGNOSIS — R7309 Other abnormal glucose: Secondary | ICD-10-CM

## 2021-05-29 DIAGNOSIS — Z23 Encounter for immunization: Secondary | ICD-10-CM

## 2021-05-29 DIAGNOSIS — Z1211 Encounter for screening for malignant neoplasm of colon: Secondary | ICD-10-CM

## 2021-05-29 DIAGNOSIS — E559 Vitamin D deficiency, unspecified: Secondary | ICD-10-CM

## 2021-05-29 DIAGNOSIS — E6609 Other obesity due to excess calories: Secondary | ICD-10-CM

## 2021-05-29 DIAGNOSIS — Z6838 Body mass index (BMI) 38.0-38.9, adult: Secondary | ICD-10-CM

## 2021-05-29 DIAGNOSIS — E785 Hyperlipidemia, unspecified: Secondary | ICD-10-CM

## 2021-05-29 DIAGNOSIS — F411 Generalized anxiety disorder: Secondary | ICD-10-CM

## 2021-05-29 LAB — CBC WITH DIFFERENTIAL/PLATELET
Basophils Absolute: 0.1 10*3/uL (ref 0.0–0.1)
Basophils Relative: 0.9 % (ref 0.0–3.0)
Eosinophils Absolute: 0.1 10*3/uL (ref 0.0–0.7)
Eosinophils Relative: 1.7 % (ref 0.0–5.0)
HCT: 43.1 % (ref 36.0–46.0)
Hemoglobin: 14.1 g/dL (ref 12.0–15.0)
Lymphocytes Relative: 21.3 % (ref 12.0–46.0)
Lymphs Abs: 1.7 10*3/uL (ref 0.7–4.0)
MCHC: 32.6 g/dL (ref 30.0–36.0)
MCV: 81.6 fl (ref 78.0–100.0)
Monocytes Absolute: 0.7 10*3/uL (ref 0.1–1.0)
Monocytes Relative: 8.2 % (ref 3.0–12.0)
Neutro Abs: 5.5 10*3/uL (ref 1.4–7.7)
Neutrophils Relative %: 67.9 % (ref 43.0–77.0)
Platelets: 147 10*3/uL — ABNORMAL LOW (ref 150.0–400.0)
RBC: 5.29 Mil/uL — ABNORMAL HIGH (ref 3.87–5.11)
RDW: 14.8 % (ref 11.5–15.5)
WBC: 8.1 10*3/uL (ref 4.0–10.5)

## 2021-05-29 LAB — COMPREHENSIVE METABOLIC PANEL
ALT: 18 U/L (ref 0–35)
AST: 15 U/L (ref 0–37)
Albumin: 4.4 g/dL (ref 3.5–5.2)
Alkaline Phosphatase: 95 U/L (ref 39–117)
BUN: 9 mg/dL (ref 6–23)
CO2: 28 mEq/L (ref 19–32)
Calcium: 9.5 mg/dL (ref 8.4–10.5)
Chloride: 103 mEq/L (ref 96–112)
Creatinine, Ser: 0.84 mg/dL (ref 0.40–1.20)
GFR: 75.13 mL/min (ref >60.00)
Glucose, Bld: 110 mg/dL — ABNORMAL HIGH (ref 70–99)
Potassium: 3.8 mEq/L (ref 3.5–5.1)
Sodium: 140 mEq/L (ref 135–145)
Total Bilirubin: 0.5 mg/dL (ref 0.2–1.2)
Total Protein: 6.8 g/dL (ref 6.0–8.3)

## 2021-05-29 LAB — LIPID PANEL
Cholesterol: 215 mg/dL — ABNORMAL HIGH (ref 0–200)
HDL: 54 mg/dL (ref >39.00)
LDL Cholesterol: 134 mg/dL — ABNORMAL HIGH (ref 0–99)
NonHDL: 161.35
Total CHOL/HDL Ratio: 4
Triglycerides: 138 mg/dL (ref 0.0–149.0)
VLDL: 27.6 mg/dL (ref 0.0–40.0)

## 2021-05-29 LAB — TSH: TSH: 3.44 u[IU]/mL (ref 0.35–5.50)

## 2021-05-29 LAB — VITAMIN D 25 HYDROXY (VIT D DEFICIENCY, FRACTURES): VITD: 50.41 ng/mL (ref 30.00–100.00)

## 2021-05-29 MED ORDER — AMLODIPINE BESYLATE 2.5 MG PO TABS: 2.5000 mg | ORAL_TABLET | Freq: Every day | ORAL | 3 refills | Status: DC

## 2021-05-29 MED ORDER — FLUTICASONE PROPIONATE 50 MCG/ACT NA SUSP: 2.0000 | Freq: Every day | NASAL | 3 refills | Status: DC | PRN

## 2021-05-29 MED ORDER — SERTRALINE HCL 25 MG PO TABS: 25.0000 mg | ORAL_TABLET | Freq: Every day | ORAL | 3 refills | Status: DC

## 2021-05-29 NOTE — Assessment & Plan Note (Signed)
Reviewed stressors/ coping techniques/symptoms/ support sources/ tx options and side effects in detail today Sertraline is helpful 25 mg daily  Has seen counselor  Stress over care of her mother

## 2021-05-29 NOTE — Patient Instructions (Addendum)
Try to get most of your carbohydrates from produce (with the exception of white potatoes)  Eat less bread/pasta/rice/snack foods/cereals/sweets and other items from the middle of the grocery store (processed carbs)  [facility] is a clinic through [facility] -if you want a referral let me know   Labs today  Flu shot today

## 2021-05-29 NOTE — Progress Notes (Signed)
Subjective:    Patient ID: Makayla [name], female    DOB: 07-19-1961, 60 y.o.   MRN: [id number]  This visit occurred during the SARS-CoV-2 public health emergency.  Safety protocols were in place, including screening questions prior to the visit, additional usage of staff PPE, and extensive cleaning of exam room while observing appropriate contact time as indicated for disinfecting solutions.   HPI Here for health maintenance exam and to review chronic medical problems   Wt Readings from Last 3 Encounters:  05/29/21 217 lb (98.4 kg)  05/13/21 212 lb (96.2 kg)  03/17/21 212 lb (96.2 kg)   38.44 kg/m  Watching what she is eating Likes myfitness pal plan  Would like to loose weight   If bread- whole grain  No sweets  No pasta    Doing ok overall  Trying to take care of herself   Zoster status =not interested in shingrix yet  Covid immunized  9/18 Td Had flu shot   Mammogram 11/21  Has it scheduled already for next mo  Self breast exam :-no lumps or changes (chronic discomfort from scar tissue)  Personal h/o malignant phylloides breast tumor  Supplements  Due for vit D level  No calcium  Has been walking for exercise  30 minutes per day   Colonoscopy 3/22 Incomplete, had a complication  Will need to repeat it in 6-12 months  Did asp/acid reflux  (ended up with pna from it)    HTN bp is stable today  No cp or palpitations or headaches or edema  No side effects to medicines  BP Readings from Last 3 Encounters:  05/29/21 126/82  05/13/21 125/74  04/04/21 128/75    Amlodipine 2.5 mg daily  GERD-takes nexium 20 mg    Hyperlipidemia  Lab Results  Component Value Date   CHOL 211 (H) 05/02/2020   HDL 54.80 05/02/2020   LDLCALC 132 (H) 05/02/2020   TRIG 123.0 05/02/2020   CHOLHDL 4 05/02/2020   Due for labs   Mood - doing well Sertraline  Depression screen [facility] 2/9 05/29/2021 05/06/2020 09/30/2019 04/23/2019 01/23/2017  Decreased Interest 0 0 1 0 1  Down,  Depressed, Hopeless 0 0 0 0 1  PHQ - 2 Score 0 0 1 0 2  Altered sleeping 0 1 - 1 3  Tired, decreased energy 0 0 - 0 1  Change in appetite 0 0 - 0 1  Feeling bad or failure about yourself  0 0 - 0 0  Trouble concentrating 0 0 - 1 1  Moving slowly or fidgety/restless 0 0 - 0 0  Suicidal thoughts 0 0 - 0 0  PHQ-9 Score 0 1 - 2 8  Difficult doing work/chores Not difficult at all Not difficult at all - Not difficult at all -    Patient Active Problem List   Diagnosis Date Noted   Vaginitis 01/20/2021   Rib pain on left side 06/13/2020   Abdominal tenderness, left upper quadrant 06/13/2020   Colon cancer screening 05/06/2020   Insomnia, psychophysiological 03/14/2020   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   Retro-orbital pain of left eye 03/14/2020   Pain head 02/25/2020   Pulsatile tinnitus of right ear 09/30/2019   Immunization reaction 09/08/2019   Grief reaction 10/03/2018   Elevated glucose level 01/16/2018   Generalized anxiety disorder 01/23/2017   Chest pain 11/02/2016   Vitamin D deficiency 05/20/2015   Obesity 05/20/2015   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  Routine general medical examination at a health care facility 10/16/2011   Hyperlipidemia LDL goal <130 12/04/2007   GERD 12/04/2007   Essential hypertension 12/04/2007   Past Medical History:  Diagnosis Date   Allergy    pollen   Anxiety    Bursitis, trochanteric    bi/lat    Dental crowns present    x 4   Diverticulosis    GERD (gastroesophageal reflux disease)    Hyperlipidemia    no current med.   Hypertension    white coat syndrome,   Mass of breast, left 01/2012   Obesity    Past Surgical History:  Procedure Laterality Date   ABDOMINAL HYSTERECTOMY     partial   BREAST EXCISIONAL BIOPSY Left 2013   BREAST SURGERY  01/10/12   lumpectomy - left   CHOLECYSTECTOMY N/A 02/10/2016   Procedure: LAPAROSCOPIC CHOLECYSTECTOMY WITH INTRAOPERATIVE CHOLANGIOGRAM;   Surgeon: Autumn Messing III, MD;  Location: [facility];  Service: General;  Laterality: N/A;   COLONOSCOPY  04/06/2010   D Brodie, normal w/tics   RECTOCELE REPAIR  06/14/2011   cystocele repair, pt states Dr Vikki Ports did not do rectocele and said she had to come back later.   ROBOTIC ASSISTED LAPAROSCOPIC SACROCOLPOPEXY  06/14/2011   Procedure: ROBOTIC ASSISTED LAPAROSCOPIC SACROCOLPOPEXY;  Surgeon: Dutch Gray, MD;  Location: [facility];  Service: Urology;  Laterality: N/A;   TUBAL LIGATION     Social History   Tobacco Use   Smoking status: Never   Smokeless tobacco: Never  Vaping Use   Vaping Use: Never used  Substance Use Topics   Alcohol use: No    Alcohol/week: 0.0 standard drinks   Drug use: No   Family History  Problem Relation Age of Onset   Hypertension Mother    Hyperlipidemia Mother    Diabetes Mother    Breast cancer Mother 50   Uterine cancer Mother    GER disease Father    Depression Sister    Heart disease Brother    Heart attack Maternal Grandmother    Hypertension Maternal Grandmother    Colon cancer Neg Hx    Colon polyps Neg Hx    Esophageal cancer Neg Hx    Rectal cancer Neg Hx    Stomach cancer Neg Hx    Allergies  Allergen Reactions   Buspar [Buspirone]     Made her more anxious   Current Outpatient Medications on File Prior to Visit  Medication Sig Dispense Refill   b complex vitamins capsule Take 1 capsule by mouth daily.     esomeprazole (NEXIUM) 20 MG capsule Take 20 mg by mouth daily before breakfast.      ibuprofen (ADVIL) 800 MG tablet TAKE 1 TABLET BY MOUTH EVERY 8 HOURS AS NEEDED FOR PAIN 90 tablet 1   Vitamin D, Cholecalciferol, 25 MCG (1000 UT) TABS Take 5,000 Units by mouth daily.     Vitamin E 200 units TABS Take by mouth.     hydrOXYzine (ATARAX/VISTARIL) 25 MG tablet Take by mouth. (Patient not taking: Reported on 05/29/2021)     ketoconazole (NIZORAL) 2 % cream Apply to affected fingernails 1-2 times daily as needed (Patient not taking:  Reported on 05/29/2021) 15 g 0   sucralfate (CARAFATE) 1 g tablet Take 1 tablet (1 g total) by mouth 4 (four) times daily as needed. (Patient not taking: Reported on 05/29/2021) 60 tablet 0   Current Facility-Administered Medications on File Prior to Visit  Medication Dose Route Frequency Provider  Last Rate Last Admin   0.9 %  sodium chloride infusion  500 mL Intravenous Continuous Danis, Estill Cotta III, MD          Review of Systems  Constitutional:  Negative for activity change, appetite change, fatigue, fever and unexpected weight change.  HENT:  Negative for congestion, ear pain, rhinorrhea, sinus pressure and sore throat.   Eyes:  Negative for pain, redness and visual disturbance.  Respiratory:  Negative for cough, shortness of breath and wheezing.   Cardiovascular:  Negative for chest pain and palpitations.  Gastrointestinal:  Negative for abdominal pain, blood in stool, constipation and diarrhea.  Endocrine: Negative for polydipsia and polyuria.  Genitourinary:  Negative for dysuria, frequency and urgency.  Musculoskeletal:  Negative for arthralgias, back pain and myalgias.  Skin:  Negative for pallor and rash.  Allergic/Immunologic: Negative for environmental allergies.  Neurological:  Negative for dizziness, syncope and headaches.  Hematological:  Negative for adenopathy. Does not bruise/bleed easily.  Psychiatric/Behavioral:  Negative for decreased concentration and dysphoric mood. The patient is nervous/anxious.       Objective:   Physical Exam Constitutional:      General: She is not in acute distress.    Appearance: Normal appearance. She is well-developed. She is obese. She is not ill-appearing or diaphoretic.  HENT:     Head: Normocephalic and atraumatic.     Right Ear: Tympanic membrane, ear canal and external ear normal.     Left Ear: Tympanic membrane, ear canal and external ear normal.     Nose: Nose normal. No congestion.     Mouth/Throat:     Mouth: Mucous  membranes are moist.     Pharynx: Oropharynx is clear. No posterior oropharyngeal erythema.  Eyes:     General: No scleral icterus.    Extraocular Movements: Extraocular movements intact.     Conjunctiva/sclera: Conjunctivae normal.     Pupils: Pupils are equal, round, and reactive to light.  Neck:     Thyroid: No thyromegaly.     Vascular: No carotid bruit or JVD.  Cardiovascular:     Rate and Rhythm: Normal rate and regular rhythm.     Pulses: Normal pulses.     Heart sounds: Normal heart sounds.    No gallop.  Pulmonary:     Effort: Pulmonary effort is normal. No respiratory distress.     Breath sounds: Normal breath sounds. No wheezing.     Comments: Good air exch Chest:     Chest wall: No tenderness.  Abdominal:     General: Bowel sounds are normal. There is no distension or abdominal bruit.     Palpations: Abdomen is soft. There is no mass.     Tenderness: There is no abdominal tenderness.     Hernia: No hernia is present.  Genitourinary:    Comments: Breast exam: No mass, nodules, thickening, tenderness, bulging, retraction, inflamation, nipple discharge or skin changes noted.  No axillary or clavicular LA.     Musculoskeletal:        General: No tenderness. Normal range of motion.     Cervical back: Normal range of motion and neck supple. No rigidity. No muscular tenderness.     Right lower leg: No edema.     Left lower leg: No edema.  Lymphadenopathy:     Cervical: No cervical adenopathy.  Skin:    General: Skin is warm and dry.     Coloration: Skin is not pale.     Findings: No erythema  or rash.     Comments: Fair Some lentigines and regular appearing brown nevi  Neurological:     Mental Status: She is alert. Mental status is at baseline.     Cranial Nerves: No cranial nerve deficit.     Motor: No abnormal muscle tone.     Coordination: Coordination normal.     Gait: Gait normal.     Deep Tendon Reflexes: Reflexes are normal and symmetric. Reflexes normal.   Psychiatric:        Mood and Affect: Mood normal.        Cognition and Memory: Cognition and memory normal.          Assessment & Plan:   Problem List Items Addressed This Visit       Cardiovascular and Mediastinum   Essential hypertension    bp in fair control at this time  BP Readings from Last 1 Encounters:  05/29/21 126/82  No changes needed Most recent labs reviewed  Disc lifstyle change with low sodium diet and exercise  Plan to continue amlodipine 2.5 mg daily  Labs ordered       Relevant Medications   amLODipine (NORVASC) 2.5 MG tablet   Other Relevant Orders   CBC with Differential/Platelet   Comprehensive metabolic panel   Lipid panel   TSH     Other   Hyperlipidemia LDL goal <130    Disc goals for lipids and reasons to control them Rev last labs with pt Rev low sat fat diet in detail Labs today  Diet is improved      Relevant Medications   amLODipine (NORVASC) 2.5 MG tablet   Other Relevant Orders   Lipid panel   Routine general medical examination at a health care facility    Reviewed health habits including diet and exercise and skin cancer prevention Reviewed appropriate screening tests for age  Also reviewed health mt list, fam hx and immunization status , as well as social and family history   See HPI Labs ordered Declines shingrix vaccine  Is covid immunized Flu shot given today  Mammogram 11/21 and has next scheduled  Has h/o breast cancer in the past  Taking vit D  Colonoscopy 3/22- will need another due to incomplete in the next year       Vitamin D deficiency    D level today  Taking 5000 iu daily  Disc importance to bone and overall health      Relevant Orders   VITAMIN D 25 Hydroxy (Vit-D Deficiency, Fractures)   Obesity    Discussed how this problem influences overall health and the risks it imposes  Reviewed plan for weight loss with lower calorie diet (via better food choices and also portion control or program like  weight watchers) and exercise building up to or more than 30 minutes 5 days per week including some aerobic activity   Using myfitness pal  Is walking  May be interested in [facility] in the future       Generalized anxiety disorder    Reviewed stressors/ coping techniques/symptoms/ support sources/ tx options and side effects in detail today Sertraline is helpful 25 mg daily  Has seen counselor  Stress over care of her mother        Relevant Medications   sertraline (ZOLOFT) 25 MG tablet   Elevated glucose level    Better diet and exercise recently      Colon cancer screening    Colonoscopy utd (3/22) but  not complete due to episode of aspiration  Plan to repeat w/in the year  GI will contact pt  No stool changes       Other Visit Diagnoses     Need for influenza vaccination    -  Primary   Relevant Orders   Flu Vaccine QUAD 6+ mos PF IM (Fluarix Quad PF) (Completed)

## 2021-05-29 NOTE — Assessment & Plan Note (Signed)
Reviewed health habits including diet and exercise and skin cancer prevention Reviewed appropriate screening tests for age  Also reviewed health mt list, fam hx and immunization status , as well as social and family history   See HPI Labs ordered Declines shingrix vaccine  Is covid immunized Flu shot given today  Mammogram 11/21 and has next scheduled  Has h/o breast cancer in the past  Taking vit D  Colonoscopy 3/22- will need another due to incomplete in the next year

## 2021-05-29 NOTE — Assessment & Plan Note (Signed)
D level today  Taking 5000 iu daily  Disc importance to bone and overall health

## 2021-05-29 NOTE — Assessment & Plan Note (Addendum)
bp in fair control at this time  BP Readings from Last 1 Encounters:  05/29/21 126/82   No changes needed Most recent labs reviewed  Disc lifstyle change with low sodium diet and exercise  Plan to continue amlodipine 2.5 mg daily  Labs ordered

## 2021-05-29 NOTE — Assessment & Plan Note (Signed)
Better diet and exercise recently

## 2021-05-29 NOTE — Assessment & Plan Note (Signed)
Colonoscopy utd (3/22) but not complete due to episode of aspiration  Plan to repeat w/in the year  GI will contact pt  No stool changes

## 2021-05-29 NOTE — Assessment & Plan Note (Signed)
Discussed how this problem influences overall health and the risks it imposes  Reviewed plan for weight loss with lower calorie diet (via better food choices and also portion control or program like weight watchers) and exercise building up to or more than 30 minutes 5 days per week including some aerobic activity   Using myfitness pal  Is walking  [facility] be interested in [facility] in the future

## 2021-05-29 NOTE — Assessment & Plan Note (Signed)
Disc goals for lipids and reasons to control them Rev last labs with pt Rev low sat fat diet in detail Labs today  Diet is improved

## 2021-06-02 ENCOUNTER — Encounter: Payer: Self-pay | Admitting: Dermatology

## 2021-06-02 DIAGNOSIS — L304 Erythema intertrigo: Secondary | ICD-10-CM

## 2021-06-06 MED ORDER — KETOCONAZOLE 2 % EX CREA: TOPICAL_CREAM | CUTANEOUS | 0 refills | Status: DC

## 2021-06-06 NOTE — Telephone Encounter (Signed)
Please prescribe ketoconazole cream to use twice a day until clear. If it does not improve within one week, would consider it may not be a yeast infection and may add hydrocortisone 2.5% cream for 1-2 weeks. Thanks!

## 2021-06-07 ENCOUNTER — Encounter: Payer: Self-pay | Admitting: Family Medicine

## 2021-06-19 ENCOUNTER — Other Ambulatory Visit: Payer: Self-pay

## 2021-06-19 ENCOUNTER — Ambulatory Visit
Admission: RE | Admit: 2021-06-19 | Discharge: 2021-06-19 | Disposition: A | Discharge status: Home / Self Care | Payer: Managed Care, Other (non HMO) | Source: Ambulatory Visit

## 2021-06-19 DIAGNOSIS — Z1231 Encounter for screening mammogram for malignant neoplasm of breast: Secondary | ICD-10-CM

## 2021-06-20 ENCOUNTER — Other Ambulatory Visit: Payer: Self-pay | Admitting: Family Medicine

## 2021-06-20 DIAGNOSIS — R928 Other abnormal and inconclusive findings on diagnostic imaging of breast: Secondary | ICD-10-CM

## 2021-07-09 ENCOUNTER — Other Ambulatory Visit: Payer: Self-pay | Admitting: Family Medicine

## 2021-07-10 MED ORDER — HYDROXYZINE HCL 25 MG PO TABS: 25.0000 mg | ORAL_TABLET | Freq: Every evening | ORAL | 3 refills | Status: DC | PRN

## 2021-07-10 NOTE — Telephone Encounter (Signed)
On med list as a historical entry. Last filled on 03/14/20 #30 tabs with 3 refills and filled by Dr. Brett Fairy, but pt did send [place] this refill request directly from her mychart, this isn't an auto refill   CPE was on 05/29/21

## 2021-07-21 ENCOUNTER — Other Ambulatory Visit: Payer: Self-pay | Admitting: Family Medicine

## 2021-07-21 ENCOUNTER — Ambulatory Visit
Admission: RE | Admit: 2021-07-21 | Discharge: 2021-07-21 | Disposition: A | Discharge status: Home / Self Care | Payer: Managed Care, Other (non HMO) | Source: Ambulatory Visit | Attending: Family Medicine | Admitting: Family Medicine

## 2021-07-21 DIAGNOSIS — R928 Other abnormal and inconclusive findings on diagnostic imaging of breast: Secondary | ICD-10-CM

## 2021-07-21 DIAGNOSIS — R921 Mammographic calcification found on diagnostic imaging of breast: Secondary | ICD-10-CM

## 2021-08-04 ENCOUNTER — Ambulatory Visit
Admission: RE | Admit: 2021-08-04 | Discharge: 2021-08-04 | Disposition: A | Discharge status: Home / Self Care | Payer: Managed Care, Other (non HMO) | Source: Ambulatory Visit | Attending: Family Medicine | Admitting: Family Medicine

## 2021-08-04 ENCOUNTER — Other Ambulatory Visit: Payer: Self-pay

## 2021-08-04 DIAGNOSIS — R921 Mammographic calcification found on diagnostic imaging of breast: Secondary | ICD-10-CM

## 2021-08-04 HISTORY — PX: BREAST BIOPSY: SHX20

## 2021-09-15 ENCOUNTER — Other Ambulatory Visit: Payer: Self-pay | Admitting: Family Medicine

## 2021-09-15 DIAGNOSIS — T3695XA Adverse effect of unspecified systemic antibiotic, initial encounter: Secondary | ICD-10-CM

## 2021-09-15 DIAGNOSIS — B379 Candidiasis, unspecified: Secondary | ICD-10-CM

## 2021-09-21 ENCOUNTER — Encounter: Payer: Self-pay | Admitting: Family Medicine

## 2021-09-21 ENCOUNTER — Ambulatory Visit: Payer: Managed Care, Other (non HMO) | Admitting: Family Medicine

## 2021-09-21 ENCOUNTER — Other Ambulatory Visit: Payer: Self-pay

## 2021-09-21 ENCOUNTER — Other Ambulatory Visit: Payer: Self-pay | Admitting: Family Medicine

## 2021-09-21 VITALS — BP 122/84 | HR 96 | Temp 97.5°F | Ht 63.0 in | Wt 216.0 lb

## 2021-09-21 DIAGNOSIS — B379 Candidiasis, unspecified: Secondary | ICD-10-CM

## 2021-09-21 DIAGNOSIS — N760 Acute vaginitis: Secondary | ICD-10-CM

## 2021-09-21 LAB — POCT WET PREP (WET MOUNT): Trichomonas Wet Prep HPF POC: ABSENT

## 2021-09-21 MED ORDER — METRONIDAZOLE 500 MG PO TABS: 500.0000 mg | ORAL_TABLET | Freq: Two times a day (BID) | ORAL | 0 refills | Status: AC

## 2021-09-21 NOTE — Assessment & Plan Note (Signed)
Pt has outer vaginal discomfort w/o discharge  Exam is reassuring  Some clue cells on wet prep  Will tx with flagyl (she tolerates well)  Disc possible atrophic vaginitis also  Need to review her breast history before making that choice Can use barrier cream if helpful also

## 2021-09-21 NOTE — Patient Instructions (Signed)
Take the flagyl for possible BV    If you want to try topical cream for yeast- you can try   Then if no improvement let me know  Use a barrier like like vaseline or desitin or A and D    This could be from vaginal atrophy as well

## 2021-09-21 NOTE — Progress Notes (Signed)
Subjective:    Patient ID: Makayla [name], female    DOB: 1961-08-01, 61 y.o.   MRN: [id number]  This visit occurred during the SARS-CoV-2 public health emergency.  Safety protocols were in place, including screening questions prior to the visit, additional usage of staff PPE, and extensive cleaning of exam room while observing appropriate contact time as indicated for disinfecting solutions.   HPI Pt presents for vaginal symptoms   Wt Readings from Last 3 Encounters:  09/21/21 216 lb (98 kg)  05/29/21 217 lb (98.4 kg)  05/13/21 212 lb (96.2 kg)   38.26 kg/m  Vaginal itch/burn for over a week  (intense burn and slight itching) Comes and goes  May get a little better after urination   Used vaseline as a barrier (it helped but did not go away) Does not feel like a yeast infection   More outside than outside   ? If vaginal atrophy  Not sexually active  No exp to STDs     No urinary symptoms   Has prolapse and considering surgery  She does have urine leaking and wears a pad   No discharge Had BV- that resolved after tx No odor   No pelvic pain or cramping   No swimming  Occ bath   If any soap/uses dove   Wet prep Results for orders placed or performed in visit on 09/21/21  POCT Wet Prep [facility])  Result Value Ref Range   Source Wet Prep POC vaginal    WBC, Wet Prep HPF POC few    Bacteria Wet Prep HPF POC Moderate (A) Few   BACTERIA WET PREP MORPHOLOGY POC     Clue Cells Wet Prep HPF POC Moderate (A) None   Clue Cells Wet Prep Whiff POC     Yeast Wet Prep HPF POC None None   KOH Wet Prep POC None None   Trichomonas Wet Prep HPF POC Absent Absent     Patient Active Problem List   Diagnosis Date Noted   Vaginitis 01/20/2021   Rib pain on left side 06/13/2020   Abdominal tenderness, left upper quadrant 06/13/2020   Colon cancer screening 05/06/2020   Insomnia, psychophysiological 03/14/2020   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    Retro-orbital pain of left eye 03/14/2020   Pain head 02/25/2020   Pulsatile tinnitus of right ear 09/30/2019   Immunization reaction 09/08/2019   Grief reaction 10/03/2018   Elevated glucose level 01/16/2018   Generalized anxiety disorder 01/23/2017   Chest pain 11/02/2016   Vitamin D deficiency 05/20/2015   Obesity 05/20/2015   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   Routine general medical examination at a health care facility 10/16/2011   Hyperlipidemia LDL goal <130 12/04/2007   GERD 12/04/2007   Essential hypertension 12/04/2007   Past Medical History:  Diagnosis Date   Allergy    pollen   Anxiety    Bursitis, trochanteric    bi/lat    Dental crowns present    x 4   Diverticulosis    GERD (gastroesophageal reflux disease)    Hyperlipidemia    no current med.   Hypertension    white coat syndrome,   Mass of breast, left 01/2012   Obesity    Past Surgical History:  Procedure Laterality Date   ABDOMINAL HYSTERECTOMY     partial   BREAST EXCISIONAL BIOPSY Left 2013   BREAST SURGERY  01/10/2012   lumpectomy - left (excisional biopsy benign)  CHOLECYSTECTOMY N/A 02/10/2016   Procedure: LAPAROSCOPIC CHOLECYSTECTOMY WITH INTRAOPERATIVE CHOLANGIOGRAM;  Surgeon: Autumn Messing III, MD;  Location: [place];  Service: General;  Laterality: N/A;   COLONOSCOPY  04/06/2010   D Brodie, normal w/tics   RECTOCELE REPAIR  06/14/2011   cystocele repair, pt states Dr Vikki Ports did not do rectocele and said she had to come back later.   ROBOTIC ASSISTED LAPAROSCOPIC SACROCOLPOPEXY  06/14/2011   Procedure: ROBOTIC ASSISTED LAPAROSCOPIC SACROCOLPOPEXY;  Surgeon: Dutch Gray, MD;  Location: [facility];  Service: Urology;  Laterality: N/A;   TUBAL LIGATION     Social History   Tobacco Use   Smoking status: Never    Passive exposure: Past   Smokeless tobacco: Never  Vaping Use   Vaping Use: Never used  Substance Use Topics   Alcohol use: No    Alcohol/week: 0.0  standard drinks   Drug use: No   Family History  Problem Relation Age of Onset   Hypertension Mother    Hyperlipidemia Mother    Diabetes Mother    Breast cancer Mother 50   Uterine cancer Mother    GER disease Father    Depression Sister    Heart disease Brother    Heart attack Maternal Grandmother    Hypertension Maternal Grandmother    Colon cancer Neg Hx    Colon polyps Neg Hx    Esophageal cancer Neg Hx    Rectal cancer Neg Hx    Stomach cancer Neg Hx    Allergies  Allergen Reactions   Buspar [Buspirone]     Made her more anxious   Current Outpatient Medications on File Prior to Visit  Medication Sig Dispense Refill   amLODipine (NORVASC) 2.5 MG tablet Take 1 tablet (2.5 mg total) by mouth daily. 90 tablet 3   b complex vitamins capsule Take 1 capsule by mouth daily.     esomeprazole (NEXIUM) 20 MG capsule Take 20 mg by mouth daily before breakfast.      fluticasone (FLONASE) 50 MCG/ACT nasal spray Place 2 sprays into both nostrils daily as needed. 48 g 3   hydrOXYzine (ATARAX) 25 MG tablet Take 1 tablet (25 mg total) by mouth at bedtime as needed. 30 tablet 3   ibuprofen (ADVIL) 800 MG tablet TAKE 1 TABLET BY MOUTH EVERY 8 HOURS AS NEEDED FOR PAIN 90 tablet 1   ketoconazole (NIZORAL) 2 % cream Apply to affected fingernails 1-2 times daily as needed 15 g 0   sertraline (ZOLOFT) 25 MG tablet Take 1 tablet (25 mg total) by mouth daily. 90 tablet 3   Vitamin D, Cholecalciferol, 25 MCG (1000 UT) TABS Take 5,000 Units by mouth daily.     Vitamin E 200 units TABS Take by mouth.     No current facility-administered medications on file prior to visit.    Review of Systems  Constitutional:  Negative for activity change, appetite change, fatigue, fever and unexpected weight change.  HENT:  Negative for congestion, ear pain, rhinorrhea, sinus pressure and sore throat.   Eyes:  Negative for pain, redness and visual disturbance.  Respiratory:  Negative for cough, shortness of  breath and wheezing.   Cardiovascular:  Negative for chest pain and palpitations.  Gastrointestinal:  Negative for abdominal pain, blood in stool, constipation and diarrhea.  Endocrine: Negative for polydipsia and polyuria.  Genitourinary:  Positive for vaginal pain. Negative for dysuria, frequency, urgency, vaginal bleeding and vaginal discharge.  Musculoskeletal:  Negative for arthralgias, back pain and myalgias.  Skin:  Negative for pallor and rash.  Allergic/Immunologic: Negative for environmental allergies.  Neurological:  Negative for dizziness, syncope and headaches.  Hematological:  Negative for adenopathy. Does not bruise/bleed easily.  Psychiatric/Behavioral:  Negative for decreased concentration and dysphoric mood. The patient is not nervous/anxious.       Objective:   Physical Exam Constitutional:      Appearance: Normal appearance. She is obese.  Eyes:     General:        Right eye: No discharge.        Left eye: No discharge.     Conjunctiva/sclera: Conjunctivae normal.     Pupils: Pupils are equal, round, and reactive to light.  Cardiovascular:     Rate and Rhythm: Normal rate and regular rhythm.  Pulmonary:     Effort: No respiratory distress.  Abdominal:     General: Abdomen is flat. There is no distension.     Palpations: Abdomen is soft.     Tenderness: There is no abdominal tenderness.     Comments: No suprapubic tenderness or fullness    Genitourinary:    Comments: Nl external genitalia No discharge No tenderness  Musculoskeletal:     Cervical back: Normal range of motion and neck supple.  Lymphadenopathy:     Cervical: No cervical adenopathy.  Neurological:     Mental Status: She is alert.  Psychiatric:        Mood and Affect: Mood normal.          Assessment & Plan:   Problem List Items Addressed This Visit       Genitourinary   Vaginitis - Primary    Pt has outer vaginal discomfort w/o discharge  Exam is reassuring  Some clue cells  on wet prep  Will tx with flagyl (she tolerates well)  Disc possible atrophic vaginitis also  Need to review her breast history before making that choice Can use barrier cream if helpful also      Relevant Orders   POCT Wet Prep [facility])

## 2021-11-18 ENCOUNTER — Ambulatory Visit
Admission: RE | Admit: 2021-11-18 | Discharge: 2021-11-18 | Disposition: A | Discharge status: Home / Self Care | Payer: Managed Care, Other (non HMO) | Source: Ambulatory Visit | Attending: Family Medicine | Admitting: Family Medicine

## 2021-11-18 VITALS — BP 134/82 | HR 93 | Temp 98.3°F | Resp 18

## 2021-11-18 DIAGNOSIS — Z1152 Encounter for screening for COVID-19: Secondary | ICD-10-CM

## 2021-11-18 DIAGNOSIS — J0140 Acute pansinusitis, unspecified: Secondary | ICD-10-CM

## 2021-11-18 MED ORDER — FLUCONAZOLE 150 MG PO TABS: 150.0000 mg | ORAL_TABLET | Freq: Once | ORAL | 0 refills | Status: AC

## 2021-11-18 MED ORDER — AMOXICILLIN-POT CLAVULANATE 875-125 MG PO TABS: 1.0000 | ORAL_TABLET | Freq: Two times a day (BID) | ORAL | 0 refills | Status: DC

## 2021-11-18 NOTE — ED Triage Notes (Signed)
Pt c/o sinus pressure/pain, cough, left ear pain, bodyaches and fever x 1 week. At home covid test was negative. ?

## 2021-11-18 NOTE — Discharge Instructions (Addendum)
COVID/Flu test pending and your results will be available 2-3 days. ?If positive our office will notify you of your results. ?Continue Tylenol  and Flonase. ? ?Complete entire course of antibiotics.  If symptoms worsen or do not readily improve return for evaluation. ?

## 2021-11-18 NOTE — ED Provider Notes (Signed)
?[facility] ? ? ? ?CSN: [id number] ?Arrival date & time: 11/18/21  6270 ? ? ?  ? ?History   ?Chief Complaint ?Chief Complaint  ?Patient presents with  ? Generalized Body Aches  ?  Sinus and ear infection symptoms. - Entered by patient  ? ? ?HPI ?Makayla [name] is a 61 y.o. female.  ? ?HPI ?Patient presents today with a 6-day history of sinus congestion, facial pressure, fever Tmax 101 4 days ago, generally feeling unwell, body aches and chills.  Patient took a home COVID test 4 days ago which was negative.  She endorses body aches have somewhat improved but she still feels outside of her normal state of health.  She has been taken Tylenol daily and using Flonase. ?She denies any shortness of breath or chest pain. ? ?Past Medical History:  ?Diagnosis Date  ? Allergy   ? pollen  ? Anxiety   ? Bursitis, trochanteric   ? bi/lat   ? Dental crowns present   ? x 4  ? Diverticulosis   ? GERD (gastroesophageal reflux disease)   ? Hyperlipidemia   ? no current med.  ? Hypertension   ? white coat syndrome,  ? Mass of breast, left 01/2012  ? Obesity   ? ? ?Patient Active Problem List  ? Diagnosis Date Noted  ? Vaginitis 01/20/2021  ? Rib pain on left side 06/13/2020  ? Abdominal tenderness, left upper quadrant 06/13/2020  ? Colon cancer screening 05/06/2020  ? Insomnia, psychophysiological 03/14/2020  ? 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 Retro-orbital pain of left eye 03/14/2020  ? Pain head 02/25/2020  ? Pulsatile tinnitus of right ear 09/30/2019  ? Immunization reaction 09/08/2019  ? Grief reaction 10/03/2018  ? Elevated glucose level 01/16/2018  ? Generalized anxiety disorder 01/23/2017  ? Chest pain 11/02/2016  ? Vitamin D deficiency 05/20/2015  ? Obesity 05/20/2015  ? 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  ? Routine general medical examination at a health care facility 10/16/2011  ? Hyperlipidemia LDL goal <130 12/04/2007  ? GERD 12/04/2007  ? Essential hypertension  12/04/2007  ? ? ?Past Surgical History:  ?Procedure Laterality Date  ? ABDOMINAL HYSTERECTOMY    ? partial  ? BREAST EXCISIONAL BIOPSY Left 2013  ? BREAST SURGERY  01/10/2012  ? lumpectomy - left (excisional biopsy benign)  ? CHOLECYSTECTOMY N/A 02/10/2016  ? Procedure: LAPAROSCOPIC CHOLECYSTECTOMY WITH INTRAOPERATIVE CHOLANGIOGRAM;  Surgeon: Autumn Messing III, MD;  Location: [place];  Service: General;  Laterality: N/A;  ? COLONOSCOPY  04/06/2010  ? D Brodie, normal w/tics  ? RECTOCELE REPAIR  06/14/2011  ? cystocele repair, pt states Dr Vikki Ports did not do rectocele and said she had to come back later.  ? ROBOTIC ASSISTED LAPAROSCOPIC SACROCOLPOPEXY  06/14/2011  ? Procedure: ROBOTIC ASSISTED LAPAROSCOPIC SACROCOLPOPEXY;  Surgeon: Dutch Gray, MD;  Location: [facility];  Service: Urology;  Laterality: N/A;  ? TUBAL LIGATION    ? ? ?OB History   ? ? Gravida  ?1  ? Para  ?   ? Term  ?   ? Preterm  ?   ? AB  ?   ? Living  ?1  ?  ? ? SAB  ?   ? IAB  ?   ? Ectopic  ?   ? Multiple  ?   ? Live Births  ?1  ?   ?  ?  ? ? ? ?Home Medications   ? ?Prior to Admission medications   ?  Medication Sig Start Date End Date Taking? Authorizing Provider  ?amLODipine (NORVASC) 2.5 MG tablet Take 1 tablet (2.5 mg total) by mouth daily. 05/29/21   Tower, Wynelle Fanny, MD  ?b complex vitamins capsule Take 1 capsule by mouth daily.    [provider]  ?esomeprazole (NEXIUM) 20 MG capsule Take 20 mg by mouth daily before breakfast.     [provider]  ?fluticasone (FLONASE) 50 MCG/ACT nasal spray Place 2 sprays into both nostrils daily as needed. 05/29/21   Tower, Wynelle Fanny, MD  ?hydrOXYzine (ATARAX) 25 MG tablet Take 1 tablet (25 mg total) by mouth at bedtime as needed. 07/10/21   Tower, Wynelle Fanny, MD  ?ibuprofen (ADVIL) 800 MG tablet TAKE 1 TABLET BY MOUTH EVERY 8 HOURS AS NEEDED FOR PAIN 05/25/19   Tower, Wynelle Fanny, MD  ?ketoconazole (NIZORAL) 2 % cream Apply to affected fingernails 1-2 times daily as needed 05/03/21   [facility] [place], MD   ?sertraline (ZOLOFT) 25 MG tablet Take 1 tablet (25 mg total) by mouth daily. 05/29/21   Tower, Wynelle Fanny, MD  ?Vitamin D, Cholecalciferol, 25 MCG (1000 UT) TABS Take 5,000 Units by mouth daily.    [provider]  ?Vitamin E 200 units TABS Take by mouth.    [provider]  ? ? ?Family History ?Family History  ?Problem Relation Age of Onset  ? Hypertension Mother   ? Hyperlipidemia Mother   ? Diabetes Mother   ? Breast cancer Mother 12  ? Uterine cancer Mother   ? GER disease Father   ? Depression Sister   ? Heart disease Brother   ? Heart attack Maternal Grandmother   ? Hypertension Maternal Grandmother   ? Colon cancer Neg Hx   ? Colon polyps Neg Hx   ? Esophageal cancer Neg Hx   ? Rectal cancer Neg Hx   ? Stomach cancer Neg Hx   ? ? ?Social History ?Social History  ? ?Tobacco Use  ? Smoking status: Never  ?  Passive exposure: Past  ? Smokeless tobacco: Never  ?Vaping Use  ? Vaping Use: Never used  ?Substance Use Topics  ? Alcohol use: No  ?  Alcohol/week: 0.0 standard drinks  ? Drug use: No  ? ? ? ?Allergies   ?Buspar [buspirone] ? ?Review of Systems ?Review of Systems ?Pertinent negatives listed in HPI  ? ?Physical Exam ?Triage Vital Signs ?ED Triage Vitals  ?Enc Vitals Group  ?   BP 11/18/21 0942 134/82  ?   Pulse Rate 11/18/21 0942 93  ?   Resp 11/18/21 0942 18  ?   Temp 11/18/21 0942 98.3 ?F (36.8 ?C)  ?   Temp Source 11/18/21 0942 Oral  ?   SpO2 11/18/21 0942 96 %  ?   Weight --   ?   Height --   ?   Head Circumference --   ?   Peak Flow --   ?   Pain Score 11/18/21 0948 0  ?   Pain Loc --   ?   Pain Edu? --   ?   Excl. in [place]? --   ? ?No data found. ? ?Updated Vital Signs ?BP 134/82 (BP Location: Left Arm)   Pulse 93   Temp 98.3 ?F (36.8 ?C) (Oral)   Resp 18   SpO2 96%  ? ?Visual Acuity ?Right Eye Distance:   ?Left Eye Distance:   ?Bilateral Distance:   ? ?Right Eye Near:   ?Left Eye Near:    ?  Bilateral Near:    ? ?Physical Exam ? ?General Appearance:    Alert, acutely ill-appearing,  cooperative, no distress  ?HENT:   Normocephalic, ears normal, nares mucosal edema with congestion, rhinorrhea, oropharynx mild erythema with cervical adenopathy     ?Eyes:    PERRL, conjunctiva/corneas clear, EOM's intact       ?Lungs:     Clear to auscultation bilaterally, respirations unlabored  ?Heart:    Regular rate and rhythm  ?Neurologic:   Awake, alert, oriented x 3. No apparent focal neurological           defect.   ? ? ?UC Treatments / Results  ?Labs ?(all labs ordered are listed, but only abnormal results are displayed) ?Labs Reviewed  ?COVID-19, FLU A+B NAA  ? ? ?EKG ? ? ?Radiology ?No results found. ? ?Procedures ?Procedures (including critical care time) ? ?Medications Ordered in UC ?Medications - No data to display ? ?Initial Impression / Assessment and Plan / UC Course  ?I have reviewed the triage vital signs and the nursing notes. ? ?Pertinent labs & imaging results that were available during my care of the patient were reviewed by me and considered in my medical decision making (see chart for details). ? ?  ?Acute non-recurrent sinusitis  ?Covid test pending ?Treatment with Augmentin BID x 10 days. ?Continue Tylenol and Flonase as needed. ?Final Clinical Impressions(s) / UC Diagnoses  ? ?Final diagnoses:  ?Acute non-recurrent pansinusitis  ?Encounter for screening for COVID-19  ? ? ? ?Discharge Instructions   ? ?  ?COVID/Flu test pending and your results will be available 2-3 days. ?If positive our office will notify you of your results. ?Continue Tylenol  and Flonase. ? ?Complete entire course of antibiotics.  If symptoms worsen or do not readily improve return for evaluation. ? ? ?ED Prescriptions   ?None ?  ? ?PDMP not reviewed this encounter. ?  ?Scot Jun, FNP ?11/18/21 1019 ? ?

## 2021-11-19 LAB — COVID-19, FLU A+B NAA
Influenza A, NAA: NOT DETECTED
Influenza B, NAA: NOT DETECTED
SARS-CoV-2, NAA: NOT DETECTED

## 2022-01-10 ENCOUNTER — Telehealth: Payer: Self-pay | Admitting: Family Medicine

## 2022-01-10 NOTE — Telephone Encounter (Signed)
[facility] - Client TELEPHONE ADVICE RECORD [facility] Patient Name: Makayla [name] OD Gender: Female DOB: 1960/10/17 Age: 61 Y 9 M 19 D Return Phone Number: [phone] (Primary) Address: City/ State/ Zip: [place]  [address] Client [facility] - Client Client Site [place] Provider Tower, Roque Lias - MD Contact Type Call Who Is Calling Patient / Member / Family / Caregiver Call Type Triage / Clinical Relationship To Patient Self Return Phone Number [phone] (Primary) Chief Complaint Abdominal Pain Reason for Call Symptomatic / Request for [place] states that she is transferring a patient to be triaged. She is having upper abdominal pain and needs to be triaged for this. Not severe, it is mild to moderate. 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 She said the dermatologist told her it normally wouldn't hurt unless pressed on a nerve but she is calling because it is botherin her now. Translation No Nurse Assessment Nurse: Eugenio Hoes, RN, Jenny Reichmann Date/Time (Eastern Time): 01/10/2022 12:37:08 PM Confirm and document reason for call. If symptomatic, describe symptoms. ---Caller states that she started to have LUQ abdominal pain yesterday. Caller states that she does have a history of lipoma. Caller states that it comes and goes and lasts about 2 hours at at time. 5-6/10 Does the patient have any new or worsening symptoms? ---Yes Will a triage be completed? ---Yes Related visit to physician within the last 2 weeks? ---No Does the PT have any chronic conditions? (i.e. diabetes, asthma, this includes High risk factors for pregnancy, etc.) ---Yes List chronic conditions. ---HTN Is this a behavioral health or substance abuse call? ---No Guidelines Guideline Title Affirmed Question  Affirmed Notes Nurse Date/Time Eilene Ghazi Time) Abdominal Pain - Upper [1] Pain lasts > 10 minutes AND [2] age > 52 Lynett Fish 01/10/2022 12:39:21 PM PLEASE NOTE: All timestamps contained within this report are represented as [place] Standard Time. CONFIDENTIALTY NOTICE: This fax transmission is intended only for the addressee. It contains information that is legally privileged, confidential or otherwise protected from use or disclosure. If you are not the intended recipient, you are strictly prohibited from reviewing, disclosing, copying using or disseminating any of this information or taking any action in reliance on or regarding this information. If you have received this fax in error, please notify us immediately by telephone so that we can arrange for its return to [place]. Phone: [phone], Toll-Free: [phone], Fax: [phone] Page: 2 of 2 Call Id: [id number] [place]. Time Eilene Ghazi Time) Disposition Final User 01/10/2022 12:41:52 PM Go to ED Now Yes Eugenio Hoes, RN, [place] Disagree/Comply Disagree Caller Understands Yes PreDisposition Home Care Care Advice Given Per Guideline GO TO ED NOW: * Leave now. Drive carefully. CARE ADVICE given per Abdominal Pain - Upper (Adult) guideline. Comments User: Baird Cancer, RN Date/Time Eilene Ghazi Time): 01/10/2022 12:42:36 PM Caller states that she will not be going to ED or UC and that she will just call to schedule an appointment with her pcp at the office Referrals [place]

## 2022-01-10 NOTE — Telephone Encounter (Signed)
Pt called about experiencing upper abdominal pain and she said the Dermatologist diagnosed her with this. She stated she was in pain over the phone but I transferred her to Access Nurse to see if she needs to get seen sooner than her appointment.   Callback Number: [phone]

## 2022-01-10 NOTE — Telephone Encounter (Signed)
Noted  

## 2022-01-10 NOTE — Telephone Encounter (Signed)
I spoke with pt; pt said she has on and off since yesterday LUQ pain in abd. 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 Pt had not had any CP, SOB, N&V and no severe abd pain. Pt scheduled appt with Dr Diona Browner on 01/11/22 at 8:40. UC & ED precautions given and pt voiced understanding. Sending note to Dr Diona Browner who is out of office today and Butch Penny CMA.

## 2022-01-11 ENCOUNTER — Ambulatory Visit: Payer: Managed Care, Other (non HMO) | Admitting: Family Medicine

## 2022-01-11 ENCOUNTER — Encounter: Payer: Self-pay | Admitting: Family Medicine

## 2022-01-11 ENCOUNTER — Other Ambulatory Visit: Payer: Self-pay | Admitting: Family Medicine

## 2022-01-11 VITALS — BP 122/84 | HR 89 | Temp 97.9°F | Resp 16 | Ht 63.0 in | Wt 220.1 lb

## 2022-01-11 DIAGNOSIS — R10812 Left upper quadrant abdominal tenderness: Secondary | ICD-10-CM | POA: Diagnosis not present

## 2022-01-11 LAB — CBC WITH DIFFERENTIAL/PLATELET
Basophils Absolute: 0.1 10*3/uL (ref 0.0–0.1)
Basophils Relative: 0.8 % (ref 0.0–3.0)
Eosinophils Absolute: 0.2 10*3/uL (ref 0.0–0.7)
Eosinophils Relative: 1.9 % (ref 0.0–5.0)
HCT: 41.8 % (ref 36.0–46.0)
Hemoglobin: 13.6 g/dL (ref 12.0–15.0)
Lymphocytes Relative: 20.3 % (ref 12.0–46.0)
Lymphs Abs: 1.8 10*3/uL (ref 0.7–4.0)
MCHC: 32.6 g/dL (ref 30.0–36.0)
MCV: 81.2 fl (ref 78.0–100.0)
Monocytes Absolute: 0.6 10*3/uL (ref 0.1–1.0)
Monocytes Relative: 7.3 % (ref 3.0–12.0)
Neutro Abs: 6 10*3/uL (ref 1.4–7.7)
Neutrophils Relative %: 69.7 % (ref 43.0–77.0)
Platelets: 172 10*3/uL (ref 150.0–400.0)
RBC: 5.14 Mil/uL — ABNORMAL HIGH (ref 3.87–5.11)
RDW: 15.1 % (ref 11.5–15.5)
WBC: 8.7 10*3/uL (ref 4.0–10.5)

## 2022-01-11 LAB — COMPREHENSIVE METABOLIC PANEL
ALT: 19 U/L (ref 0–35)
AST: 13 U/L (ref 0–37)
Albumin: 4.2 g/dL (ref 3.5–5.2)
Alkaline Phosphatase: 95 U/L (ref 39–117)
BUN: 10 mg/dL (ref 6–23)
CO2: 29 mEq/L (ref 19–32)
Calcium: 9.6 mg/dL (ref 8.4–10.5)
Chloride: 104 mEq/L (ref 96–112)
Creatinine, Ser: 0.81 mg/dL (ref 0.40–1.20)
GFR: 78.14 mL/min (ref >60.00)
Glucose, Bld: 106 mg/dL — ABNORMAL HIGH (ref 70–99)
Potassium: 4.1 mEq/L (ref 3.5–5.1)
Sodium: 140 mEq/L (ref 135–145)
Total Bilirubin: 0.5 mg/dL (ref 0.2–1.2)
Total Protein: 6.7 g/dL (ref 6.0–8.3)

## 2022-01-11 LAB — LIPASE: Lipase: 13 U/L (ref 11.0–59.0)

## 2022-01-11 NOTE — Patient Instructions (Addendum)
Please stop at the lab to have labs drawn.  Can use tylenol for pain... avoid NSAIDs.  Continue nexium .Marland Kitchen Increase to 40 mg daily if not already using. Start bowel rest.. clear liquids for 24-48 hours.Marland Kitchen advance as tolerated.  Follow up with PCP or GI if not improving as expected.  Go to ER if severe pain  or exertional chest pain/shortness of breath begins.

## 2022-01-11 NOTE — Progress Notes (Signed)
Patient ID: Makayla [name], female    DOB: May 11, 1961, 61 y.o.   MRN: [id number]  This visit was conducted in person.  BP 122/84   Pulse 89   Temp 97.9 F (36.6 C)   Resp 16   Ht '5\' 3"'$  (1.6 m)   Wt 220 lb 2 oz (99.8 kg)   SpO2 98%   BMI 38.99 kg/m    CC:  Chief Complaint  Patient presents with   Abdominal Pain    Left upper under breast. X 2 days     Subjective:   HPI: Makayla [name] is a 61 y.o. female  patient of Dr. Marliss Coots  with history of  GERD and breast cancer presenting on 01/11/2022 for Abdominal Pain (Left upper under breast. X 2 days )  Per problem list reviewed and past notes... Hx of LUQ abd pain in 06/2020, referred to GI, unremarkable imaging. Also ED visit on 05/2021 for LUQ pain.Marland Kitchen again negative CT scan, UA and labs.. felt likely PUD, gastritis.Marland Kitchen treated with carafate.. she did not use this. 10/2020 colonoscopy:  diverticulosis   Today she reports new onset pain in last 2 days in her LUQ of abdomen. Intermittent. Feels sharp, then to achy.. lasts 1-2 hours.  No Diarrhea, no constipation.. has daily BM, no blood seen.   Pain  scale on 5-6  No fever. No dysuria, no hematuria, no flank pain  Associated with  mild nausea  She has tried to treat with tylenol.. no change in pain.      No SOB, no exertional chest pain. Recent EKG at cardiology for palpitations.   Relevant past medical, surgical, family and social history reviewed and updated as indicated. Interim medical history since our last visit reviewed. Allergies and medications reviewed and updated. Outpatient Medications Prior to Visit  Medication Sig Dispense Refill   amLODipine (NORVASC) 2.5 MG tablet Take 1 tablet (2.5 mg total) by mouth daily. 90 tablet 3   b complex vitamins capsule Take 1 capsule by mouth daily.     esomeprazole (NEXIUM) 20 MG capsule Take 20 mg by mouth daily before breakfast.      fluticasone (FLONASE) 50 MCG/ACT nasal spray Place 2 sprays into both nostrils daily as needed. 48  g 3   ibuprofen (ADVIL) 800 MG tablet TAKE 1 TABLET BY MOUTH EVERY 8 HOURS AS NEEDED FOR PAIN 90 tablet 1   sertraline (ZOLOFT) 25 MG tablet Take 1 tablet (25 mg total) by mouth daily. 90 tablet 3   Vitamin D, Cholecalciferol, 25 MCG (1000 UT) TABS Take 5,000 Units by mouth daily.     Vitamin E 200 units TABS Take by mouth.     amoxicillin-clavulanate (AUGMENTIN) 875-125 MG tablet Take 1 tablet by mouth 2 (two) times daily. (Patient not taking: Reported on 01/11/2022) 20 tablet 0   hydrOXYzine (ATARAX) 25 MG tablet Take 1 tablet (25 mg total) by mouth at bedtime as needed. (Patient not taking: Reported on 01/11/2022) 30 tablet 3   ketoconazole (NIZORAL) 2 % cream Apply to affected fingernails 1-2 times daily as needed (Patient not taking: Reported on 01/11/2022) 15 g 0   No facility-administered medications prior to visit.     Per HPI unless specifically indicated in ROS section below Review of Systems  Constitutional:  Negative for fatigue and fever.  HENT:  Negative for congestion.   Eyes:  Negative for pain.  Respiratory:  Negative for cough and shortness of breath.   Cardiovascular:  Negative for chest  pain, palpitations and leg swelling.  Gastrointestinal:  Negative for abdominal pain.  Genitourinary:  Negative for decreased urine volume, dysuria, flank pain, frequency, hematuria, urgency, vaginal bleeding and vaginal pain.  Musculoskeletal:  Negative for back pain.  Neurological:  Negative for syncope, light-headedness and headaches.  Psychiatric/Behavioral:  Negative for dysphoric mood.    Objective:  BP 122/84   Pulse 89   Temp 97.9 F (36.6 C)   Resp 16   Ht '5\' 3"'$  (1.6 m)   Wt 220 lb 2 oz (99.8 kg)   SpO2 98%   BMI 38.99 kg/m   Wt Readings from Last 3 Encounters:  01/11/22 220 lb 2 oz (99.8 kg)  09/21/21 216 lb (98 kg)  05/29/21 217 lb (98.4 kg)      Physical Exam Constitutional:      General: She is not in acute distress.    Appearance: Normal appearance. She is  well-developed. She is not ill-appearing or toxic-appearing.  HENT:     Head: Normocephalic.     Right Ear: Hearing, tympanic membrane, ear canal and external ear normal. Tympanic membrane is not erythematous, retracted or bulging.     Left Ear: Hearing, tympanic membrane, ear canal and external ear normal. Tympanic membrane is not erythematous, retracted or bulging.     Nose: No mucosal edema or rhinorrhea.     Right Sinus: No maxillary sinus tenderness or frontal sinus tenderness.     Left Sinus: No maxillary sinus tenderness or frontal sinus tenderness.     Mouth/Throat:     Pharynx: Uvula midline.  Eyes:     General: Lids are normal. Lids are everted, no foreign bodies appreciated.     Conjunctiva/sclera: Conjunctivae normal.     Pupils: Pupils are equal, round, and reactive to light.  Neck:     Thyroid: No thyroid mass or thyromegaly.     Vascular: No carotid bruit.     Trachea: Trachea normal.  Cardiovascular:     Rate and Rhythm: Normal rate and regular rhythm.     Pulses: Normal pulses.     Heart sounds: Normal heart sounds, S1 normal and S2 normal. No murmur heard.    No friction rub. No gallop.  Pulmonary:     Effort: Pulmonary effort is normal. No tachypnea or respiratory distress.     Breath sounds: Normal breath sounds. No decreased breath sounds, wheezing, rhonchi or rales.  Abdominal:     General: Bowel sounds are normal.     Palpations: Abdomen is soft.     Tenderness: There is abdominal tenderness in the left upper quadrant. There is no right CVA tenderness, left CVA tenderness, guarding or rebound. Negative signs include Murphy's sign.  Musculoskeletal:     Cervical back: Normal range of motion and neck supple.  Skin:    General: Skin is warm and dry.     Findings: No rash.  Neurological:     Mental Status: She is alert.  Psychiatric:        Mood and Affect: Mood is not anxious or depressed.        Speech: Speech normal.        Behavior: Behavior normal.  Behavior is cooperative.        Thought Content: Thought content normal.        Judgment: Judgment normal.       Results for orders placed or performed during the hospital encounter of 11/18/21  Coronavirus (COVID-19) with Influenza A and Influenza B   Specimen:  Nasopharyngeal   Naso  Result Value Ref Range   SARS-CoV-2, NAA Not Detected Not Detected   Influenza A, NAA Not Detected Not Detected   Influenza B, NAA Not Detected Not Detected   Test Information: Comment      COVID 19 screen:  No recent travel or known exposure to COVID19 The patient denies respiratory symptoms of COVID 19 at this time. The importance of social distancing was discussed today.   Assessment and Plan    Problem List Items Addressed This Visit     Abdominal tenderness, left upper quadrant - Primary   Acute, unclear etiology Possible diverticulitis but with past CT scans ( 2021 and 2022 with similar pain.. negative) Will treat with bowel rest. Eval with labs.  Not clearly atypical chest pain.. recent nml cardiovascular evaluation.  Check lipase for possible pancreatitis.  Continue PPI and increase to cover for gastritis etc.  Return and ER precautions given for patient.  Eliezer Lofts, MD

## 2022-01-11 NOTE — Addendum Note (Signed)
Addended by: Eliezer Lofts E on: 01/11/2022 09:22 AM   Modules accepted: Orders

## 2022-01-12 ENCOUNTER — Encounter: Payer: Self-pay | Admitting: Family Medicine

## 2022-01-12 MED ORDER — AMOXICILLIN-POT CLAVULANATE 875-125 MG PO TABS: 1.0000 | ORAL_TABLET | Freq: Two times a day (BID) | ORAL | 0 refills | Status: DC

## 2022-01-12 NOTE — Addendum Note (Signed)
Addended byEliezer Lofts E on: 01/12/2022 02:52 PM   Modules accepted: Orders

## 2022-01-12 NOTE — Telephone Encounter (Signed)
Pt  seen dr Diona Browner yesterday and told if she was not feeling any better that she may need an antibiotic. Pt was when ahead request this because she didn't want to have to go the ED over the weekend. Pt also sent a my chart message to Dr. Diona Browner to let her know that she is not doing any better,and if she can get rx for an antibiotic. Informed that the message was routed to Dr Diona Browner and one she responsed to the  message someone let her know about her request .

## 2022-01-12 NOTE — Telephone Encounter (Signed)
Pt  seen dr Diona Browner yesterday and told if she was not feeling any better that she may need an antibiotic. Pt was when ahead request this because she didn't want to have to go the ED over the weekend. Pt also sent a my chart message to Dr. Diona Browner to let her know that she is not doing any better,and if she can get rx for an antibiotic. Informed that the message was routed to Dr Diona Browner and one she responsed to the  message someone let her know about her request

## 2022-01-12 NOTE — Telephone Encounter (Signed)
Left message for Makayla [name] to return my call or respond to MyChart message I just sent her about antibiotic.  Awaiting response.

## 2022-01-12 NOTE — Telephone Encounter (Signed)
Please call the patient to communicate the MyChart message if he does not read by the end of the day so that I can send in a course of antibiotics before the weekend.

## 2022-01-12 NOTE — Telephone Encounter (Signed)
Susa send MyChart message back and said Augmentin was fine.

## 2022-01-12 NOTE — Telephone Encounter (Signed)
Call patient.  I will send in an antibiotic for her to cover for diverticulitis.  I would not suggest metronidazole but instead Augmentin.  Is there a reason she cannot take Augmentin?

## 2022-01-24 ENCOUNTER — Ambulatory Visit: Payer: Managed Care, Other (non HMO) | Admitting: Family Medicine

## 2022-01-25 ENCOUNTER — Encounter: Payer: Managed Care, Other (non HMO) | Admitting: Dermatology

## 2022-01-30 ENCOUNTER — Encounter: Payer: Self-pay | Admitting: Intensive Care

## 2022-01-30 ENCOUNTER — Other Ambulatory Visit: Payer: Self-pay

## 2022-01-30 ENCOUNTER — Emergency Department: Payer: Managed Care, Other (non HMO)

## 2022-01-30 ENCOUNTER — Emergency Department
Admission: EM | Admit: 2022-01-30 | Discharge: 2022-01-30 | Disposition: A | Discharge status: Home / Self Care | Payer: Managed Care, Other (non HMO) | Attending: Emergency Medicine | Admitting: Emergency Medicine

## 2022-01-30 DIAGNOSIS — R079 Chest pain, unspecified: Secondary | ICD-10-CM | POA: Diagnosis present

## 2022-01-30 DIAGNOSIS — I10 Essential (primary) hypertension: Secondary | ICD-10-CM | POA: Insufficient documentation

## 2022-01-30 LAB — CBC
HCT: 45.7 % (ref 36.0–46.0)
Hemoglobin: 14.2 g/dL (ref 12.0–15.0)
MCH: 25.5 pg — ABNORMAL LOW (ref 26.0–34.0)
MCHC: 31.1 g/dL (ref 30.0–36.0)
MCV: 82.2 fL (ref 80.0–100.0)
Platelets: 181 10*3/uL (ref 150–400)
RBC: 5.56 MIL/uL — ABNORMAL HIGH (ref 3.87–5.11)
RDW: 14.6 % (ref 11.5–15.5)
WBC: 11.3 10*3/uL — ABNORMAL HIGH (ref 4.0–10.5)
nRBC: 0 % (ref 0.0–0.2)

## 2022-01-30 LAB — BASIC METABOLIC PANEL
Anion gap: 7 (ref 5–15)
BUN: 13 mg/dL (ref 8–23)
CO2: 27 mmol/L (ref 22–32)
Calcium: 9.7 mg/dL (ref 8.9–10.3)
Chloride: 106 mmol/L (ref 98–111)
Creatinine, Ser: 0.86 mg/dL (ref 0.44–1.00)
GFR, Estimated: >60 mL/min (ref >60)
Glucose, Bld: 127 mg/dL — ABNORMAL HIGH (ref 70–99)
Potassium: 4.1 mmol/L (ref 3.5–5.1)
Sodium: 140 mmol/L (ref 135–145)

## 2022-01-30 LAB — TROPONIN I (HIGH SENSITIVITY)
Troponin I (High Sensitivity): 2 ng/L (ref <18)
Troponin I (High Sensitivity): 2 ng/L (ref <18)

## 2022-01-30 LAB — D-DIMER, QUANTITATIVE: D-Dimer, Quant: <0.27 ug/mL-FEU (ref 0.00–0.50)

## 2022-01-30 NOTE — ED Provider Notes (Signed)
[facility] Provider Note    Event Date/Time   First MD Initiated Contact with Patient 01/30/22 1749     (approximate)   History   Chest Pain   HPI  Makayla [name] is a 61 y.o. female past medical history of hypertension hyperlipidemia, tachycardia presents with chest pain.  Notes that she has had issues with chest pain for a while.  She points to the upper abdomen describes a burning-like sensation that she had off-and-on for a while which she attributes to diverticulitis.  Over the past several weeks its been more consistent.  Then over the last several days she is also have an aching sensation in the left side of her chest.  She has been doing a lot of work in her house and pain is not exertional not pleuritic this coming and going not no clear exacerbating or alleviating factors.  She denies dyspnea nausea vomiting or diaphoresis.  She has seen cardiology in the past with normal echo.  No history of coronary disease.  She does endorse some pain in the right lower extremity which she feels is due to a varicose vein.    Past Medical History:  Diagnosis Date   Allergy    pollen   Anxiety    Bursitis, trochanteric    bi/lat    Dental crowns present    x 4   Diverticulosis    GERD (gastroesophageal reflux disease)    Hyperlipidemia    no current med.   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   Retro-orbital pain of left eye 03/14/2020   Pain head 02/25/2020   Pulsatile tinnitus of right ear 09/30/2019   Immunization reaction 09/08/2019   Grief reaction 10/03/2018   Elevated glucose level 01/16/2018    Generalized anxiety disorder 01/23/2017   Chest pain 11/02/2016   Vitamin D deficiency 05/20/2015   Obesity 05/20/2015   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   Routine general medical examination at a health care facility 10/16/2011   Hyperlipidemia LDL goal <130 12/04/2007   GERD 12/04/2007   Essential hypertension 12/04/2007     Physical Exam  Triage Vital Signs: ED Triage Vitals  Enc Vitals Group     BP 01/30/22 1629 (!) 162/89     Pulse Rate 01/30/22 1629 (!) 115     Resp 01/30/22 1629 18     Temp 01/30/22 1629 98.3 F (36.8 C)     Temp Source 01/30/22 1629 Oral     SpO2 01/30/22 1629 95 %     Weight 01/30/22 1627 212 lb (96.2 kg)     Height 01/30/22 1627 5\' 3"  (1.6 m)     Head Circumference --      Peak Flow --      Pain Score 01/30/22 1627 3     Pain Loc --      Pain Edu? --      Excl. in GC? --     Most recent vital signs: Vitals:   01/30/22 1629 01/30/22 1842  BP: (!) 162/89 136/80  Pulse: (!) 115 94  Resp: 18 16  Temp: 98.3 F (36.8 C) 98 F (36.7 C)  SpO2: 95% 96%     General: Awake, no distress.  CV:  Good peripheral perfusion.  No peripheral edema, palpable varicosity in the left calf no palpable cord no swelling Resp:  Normal effort.  Lungs are clear Abd:  No distention.  Soft minimal tenderness in the left lower quadrant no guarding Neuro:             Awake, Alert, Oriented x 3  Other:  Tachycardic   ED Results / Procedures / Treatments  Labs (all labs ordered are listed, but only abnormal results are displayed) Labs Reviewed  BASIC METABOLIC PANEL - Abnormal; Notable for the following components:      Result Value   Glucose, Bld 127 (*)    All other components within normal limits  CBC - Abnormal; Notable for the following components:   WBC 11.3 (*)    RBC 5.56 (*)    MCH 25.5 (*)    All other components within normal limits  D-DIMER, QUANTITATIVE  TROPONIN I (HIGH SENSITIVITY)  TROPONIN I (HIGH SENSITIVITY)      EKG  EKG reviewed and interpreted by myself shows sinus tachycardia with right axis deviation right ventricular hypertrophy no acute ischemic changes   RADIOLOGY I reviewed and interpreted the CXR which does not show any acute cardiopulmonary process    PROCEDURES:  Critical Care performed: No  .1-3 Lead EKG Interpretation  Performed by: Georga Hacking, MD Authorized by: Georga Hacking, MD     Interpretation: normal     ECG rate assessment: tachycardic     Rhythm: sinus tachycardia     Ectopy: none     Conduction: normal     The patient is on the cardiac monitor to evaluate for evidence of arrhythmia and/or significant heart rate changes.   MEDICATIONS ORDERED IN ED: Medications - No data to display   IMPRESSION / MDM / ASSESSMENT AND PLAN / ED COURSE  I reviewed the triage vital signs and the nursing notes.                              Patient's presentation is most consistent with acute presentation with potential threat to life or bodily function.  Differential diagnosis includes, but is not limited to, pulmonary embolism, ACS, musculoskeletal pain, acid reflux Patient is a 61 year old female presenting with chest pain.  She describes a burning-like sensation in the center of her chest has been going on for a while but then over the last several days has also had more throbbing leg pain in the left chest.  Pain is nonexertional nonpleuritic and she has no associated symptoms.  She is tachycardic here mildly hypertensive.  She has a history of sinus tachycardia in the past has seen cardiology for this.  EKG shows sinus tachycardia with a right axis deviation findings of RVH which are similar to prior EKG although patient has had an echo that was essentially normal.  Overall her pain does not sound cardiac to me.  With her tachycardia I would be concerned for potential PE so we will send D-dimer because she is otherwise low risk.  Initial troponin is negative  will repeat.  If work-up reassuring here I will have her follow-up as an outpatient with cardiology.  D-dimer and repeat troponin are negative.  Will have patient follow-up with her outpatient cardiologist.  FINAL CLINICAL IMPRESSION(S) / ED DIAGNOSES  Final diagnoses:  Chest pain, unspecified type     Rx / DC Orders   ED Discharge Orders     None        Note:  This document was prepared using Dragon voice recognition software and may include unintentional dictation errors.   Georga Hacking, MD 01/30/22 [phone]

## 2022-01-30 NOTE — ED Notes (Signed)
Pt A&O, IV removed, pt given discharge instructions, pt ambulating with steady gait. 

## 2022-02-01 ENCOUNTER — Encounter: Payer: Self-pay | Admitting: Family Medicine

## 2022-02-01 ENCOUNTER — Ambulatory Visit: Payer: Managed Care, Other (non HMO) | Admitting: Family Medicine

## 2022-02-01 VITALS — BP 140/84 | HR 100 | Temp 98.0°F | Ht 63.0 in | Wt 219.2 lb

## 2022-02-01 DIAGNOSIS — R109 Unspecified abdominal pain: Secondary | ICD-10-CM | POA: Insufficient documentation

## 2022-02-01 DIAGNOSIS — R10812 Left upper quadrant abdominal tenderness: Secondary | ICD-10-CM

## 2022-02-01 DIAGNOSIS — R1012 Left upper quadrant pain: Secondary | ICD-10-CM | POA: Insufficient documentation

## 2022-02-01 DIAGNOSIS — K219 Gastro-esophageal reflux disease without esophagitis: Secondary | ICD-10-CM

## 2022-02-01 DIAGNOSIS — R0789 Other chest pain: Secondary | ICD-10-CM

## 2022-02-01 DIAGNOSIS — R1032 Left lower quadrant pain: Secondary | ICD-10-CM | POA: Diagnosis not present

## 2022-02-01 NOTE — Patient Instructions (Addendum)
Follow up with cardiology as planned    I sent the referral to GI- please call them for an appt   I want to get a CT of your abd/pelvis  Let's see how that looks  If symptoms suddenly get severe let us know

## 2022-02-01 NOTE — Assessment & Plan Note (Signed)
Pain and tenderness in LUQ and LLQ with past h/o diverticulosis (entire colon)  After course of augmentin-minimal improvement Some cp also  Rev notes/labs/studies from visit with Dr Diona Browner and also ER (reassuring)  CT abd/pelv ordered (reviewed last) nexium 20 mg bid/bland diet  GI ref ER precautions discussed

## 2022-02-01 NOTE — Progress Notes (Signed)
Subjective:    Patient ID: Makayla [name], female    DOB: 1960-10-06, 61 y.o.   MRN: [id number]  HPI Pt presents for diverticulosis symptoms   Wt Readings from Last 3 Encounters:  02/01/22 219 lb 3.2 oz (99.4 kg)  01/30/22 212 lb (96.2 kg)  01/11/22 220 lb 2 oz (99.8 kg)   38.83 kg/m   Was seen by Dr Diona Browner on 6/8 with LUQ abd pain  Labs: cbc was normal as well as cmet and lipase   Takes ppi -inc nexium to 40 mg daily and px bowel rest   (of note in past zantac does not help) Ended up covering for diverticulitis with augmentin   Pain-now all the way across whole upper abdomen Burns  Foods that bother her- peanut butter /chocolate / rich foods  Avoids soda/ a little diet ginger ale occ [facility] in the past  No diarrhea or constipation  No blood in stool    Goes into her chest  (the other day it felt like a cramp in L upper chest)  Her L arm tingled for a minute  Some nausea but no vomiting  Finished her medicine given   Went to er with cp- negative work up /unsure if GI related  It is still there/feels like indigestion  D Dimer was negative  Recommended outpt cardiology visit   Last CT abd/pelvis was in oct 2022 [place] (Accession [id number]) (Order [id number]) Imaging Date: 05/13/2021 Department: [place] Released By/Authorizing: Arta Silence, MD (auto-released)   Exam Status  Status  Final [99]   PACS Intelerad Image Link   Show images for CT ABDOMEN PELVIS W CONTRAST  Study Result  Narrative & Impression  CLINICAL DATA:  Left upper quadrant abdominal pain for 2 days. Diverticulitis suspected.   EXAM: CT ABDOMEN AND PELVIS WITH CONTRAST   TECHNIQUE: Multidetector CT imaging of the abdomen and pelvis was performed using the standard protocol following bolus administration of intravenous contrast.   CONTRAST:  127m OMNIPAQUE IOHEXOL 350 MG/ML SOLN   COMPARISON:  04/04/2020 CT  abdomen/pelvis.   FINDINGS: Lower chest: No significant pulmonary nodules or acute consolidative airspace disease.   Hepatobiliary: Diffuse hepatic steatosis. Normal liver size. No definite liver surface irregularity. No liver masses. Cholecystectomy. No biliary ductal dilatation.   Pancreas: Normal, with no mass or duct dilation.   Spleen: Normal size. No mass.   Adrenals/Urinary Tract: Normal right adrenal. Hypodense 1.2 cm left adrenal nodule with density 41 HU, stable since 04/04/2020 CT, compatible with a benign adenoma. Simple 2.6 cm interpolar left renal cyst. Otherwise normal kidneys, with no hydronephrosis. Normal bladder.   Stomach/Bowel: Small to moderate hiatal hernia. Otherwise normal nondistended stomach. Small chronic enterocele and rectocele. No dilated or thick-walled small bowel loops. Normal appendix. Marked diffuse colonic diverticulosis with no definite large bowel wall thickening or significant pericolonic fat stranding.   Vascular/Lymphatic: Atherosclerotic nonaneurysmal abdominal aorta. Patent portal, splenic, hepatic and renal veins. No pathologically enlarged lymph nodes in the abdomen or pelvis.   Reproductive: Status post hysterectomy, with no abnormal findings at the vaginal cuff. No adnexal mass.   Other: No pneumoperitoneum, ascites or focal fluid collection. Small chronic fat containing umbilical hernia.   Musculoskeletal: No aggressive appearing focal osseous lesions. Mild thoracolumbar spondylosis.   IMPRESSION: 1. No acute abnormality. No evidence of bowel obstruction or acute bowel inflammation. Marked diffuse colonic diverticulosis, with no evidence of acute diverticulitis. 2. Small chronic enterocele and rectocele.  3. Diffuse hepatic steatosis. 4. Small to moderate hiatal hernia. 5. Stable left adrenal adenoma. 6. Aortic Atherosclerosis (ICD10-I70.0).    Labs from recent ER visit Lab Results  Component Value Date   CREATININE  0.86 01/30/2022   BUN 13 01/30/2022   NA 140 01/30/2022   K 4.1 01/30/2022   CL 106 01/30/2022   CO2 27 01/30/2022    Lab Results  Component Value Date   WBC 11.3 (H) 01/30/2022   HGB 14.2 01/30/2022   HCT 45.7 01/30/2022   MCV 82.2 01/30/2022   PLT 181 01/30/2022   Last colonoscopy 10/2020 Hot complete because she aspirated  Had diverticuli throughout colon   Patient Active Problem List   Diagnosis Date Noted   Abdominal pain, left lateral 02/01/2022   Rib pain on left side 06/13/2020   Abdominal tenderness, left upper quadrant 06/13/2020   Colon cancer screening 05/06/2020   Insomnia, psychophysiological 03/14/2020   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   Retro-orbital pain of left eye 03/14/2020   Pain head 02/25/2020   Pulsatile tinnitus of right ear 09/30/2019   Immunization reaction 09/08/2019   Grief reaction 10/03/2018   Elevated glucose level 01/16/2018   Generalized anxiety disorder 01/23/2017   Chest pain 11/02/2016   Vitamin D deficiency 05/20/2015   Obesity 05/20/2015   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   Routine general medical examination at a health care facility 10/16/2011   Hyperlipidemia LDL goal <130 12/04/2007   GERD 12/04/2007   Essential hypertension 12/04/2007   Past Medical History:  Diagnosis Date   Allergy    pollen   Anxiety    Bursitis, trochanteric    bi/lat    Dental crowns present    x 4   Diverticulosis    GERD (gastroesophageal reflux disease)    Hyperlipidemia    no current med.   Hypertension    white coat syndrome,   Mass of breast, left 01/2012   Obesity    Past Surgical History:  Procedure Laterality Date   ABDOMINAL HYSTERECTOMY     partial   BREAST EXCISIONAL BIOPSY Left 2013   BREAST SURGERY  01/10/2012   lumpectomy - left (excisional biopsy benign)   CHOLECYSTECTOMY N/A 02/10/2016   Procedure: LAPAROSCOPIC CHOLECYSTECTOMY WITH INTRAOPERATIVE CHOLANGIOGRAM;  Surgeon:  Autumn Messing III, MD;  Location: [facility];  Service: General;  Laterality: N/A;   COLONOSCOPY  04/06/2010   D Brodie, normal w/tics   RECTOCELE REPAIR  06/14/2011   cystocele repair, pt states Dr Vikki Ports did not do rectocele and said she had to come back later.   ROBOTIC ASSISTED LAPAROSCOPIC SACROCOLPOPEXY  06/14/2011   Procedure: ROBOTIC ASSISTED LAPAROSCOPIC SACROCOLPOPEXY;  Surgeon: Dutch Gray, MD;  Location: [facility];  Service: Urology;  Laterality: N/A;   TUBAL LIGATION     Social History   Tobacco Use   Smoking status: Never    Passive exposure: Past   Smokeless tobacco: Never  Vaping Use   Vaping Use: Never used  Substance Use Topics   Alcohol use: No    Alcohol/week: 0.0 standard drinks of alcohol   Drug use: No   Family History  Problem Relation Age of Onset   Hypertension Mother    Hyperlipidemia Mother    Diabetes Mother    Breast cancer Mother 26   Uterine cancer Mother    GER disease Father    Depression Sister    Heart disease Brother    Heart attack Maternal Grandmother  Hypertension Maternal Grandmother    Colon cancer Neg Hx    Colon polyps Neg Hx    Esophageal cancer Neg Hx    Rectal cancer Neg Hx    Stomach cancer Neg Hx    Allergies  Allergen Reactions   Buspar [Buspirone]     Made her more anxious   Current Outpatient Medications on File Prior to Visit  Medication Sig Dispense Refill   amLODipine (NORVASC) 2.5 MG tablet Take 1 tablet (2.5 mg total) by mouth daily. 90 tablet 3   b complex vitamins capsule Take 1 capsule by mouth daily.     esomeprazole (NEXIUM) 20 MG capsule Take 20 mg by mouth daily before breakfast.      fluticasone (FLONASE) 50 MCG/ACT nasal spray Place 2 sprays into both nostrils daily as needed. 48 g 3   hydrOXYzine (ATARAX) 25 MG tablet Take 1 tablet (25 mg total) by mouth at bedtime as needed. 30 tablet 3   ibuprofen (ADVIL) 800 MG tablet TAKE 1 TABLET BY MOUTH EVERY 8 HOURS AS NEEDED FOR PAIN 90 tablet 1   ketoconazole  (NIZORAL) 2 % cream Apply to affected fingernails 1-2 times daily as needed 15 g 0   sertraline (ZOLOFT) 25 MG tablet Take 1 tablet (25 mg total) by mouth daily. 90 tablet 3   Vitamin D, Cholecalciferol, 25 MCG (1000 UT) TABS Take 5,000 Units by mouth daily.     Vitamin E 200 units TABS Take by mouth.     amoxicillin-clavulanate (AUGMENTIN) 875-125 MG tablet Take 1 tablet by mouth 2 (two) times daily. (Patient not taking: Reported on 02/01/2022) 20 tablet 0   No current facility-administered medications on file prior to visit.    Review of Systems  Constitutional:  Negative for activity change, appetite change, fatigue, fever and unexpected weight change.  HENT:  Negative for congestion, ear pain, rhinorrhea, sinus pressure and sore throat.   Eyes:  Negative for pain, redness and visual disturbance.  Respiratory:  Negative for cough, shortness of breath and wheezing.   Cardiovascular:  Negative for chest pain and palpitations.  Gastrointestinal:  Positive for abdominal pain and nausea. Negative for abdominal distention, anal bleeding, blood in stool, constipation, diarrhea, rectal pain and vomiting.  Endocrine: Negative for polydipsia and polyuria.  Genitourinary:  Negative for dysuria, frequency and urgency.  Musculoskeletal:  Negative for arthralgias, back pain and myalgias.  Skin:  Negative for pallor and rash.  Allergic/Immunologic: Negative for environmental allergies.  Neurological:  Negative for dizziness, syncope and headaches.  Hematological:  Negative for adenopathy. Does not bruise/bleed easily.  Psychiatric/Behavioral:  Negative for decreased concentration and dysphoric mood. The patient is not nervous/anxious.        Objective:   Physical Exam Constitutional:      General: She is not in acute distress.    Appearance: Normal appearance. She is well-developed. She is obese. She is not ill-appearing or diaphoretic.  HENT:     Head: Normocephalic and atraumatic.      Mouth/Throat:     Mouth: Mucous membranes are moist.     Pharynx: No posterior oropharyngeal erythema.  Eyes:     Conjunctiva/sclera: Conjunctivae normal.     Pupils: Pupils are equal, round, and reactive to light.  Neck:     Thyroid: No thyromegaly.     Vascular: No carotid bruit or JVD.  Cardiovascular:     Rate and Rhythm: Normal rate and regular rhythm.     Heart sounds: Normal heart sounds.  No gallop.  Pulmonary:     Effort: Pulmonary effort is normal. No respiratory distress.     Breath sounds: Normal breath sounds. No wheezing or rales.  Abdominal:     General: Abdomen is protuberant. Bowel sounds are normal. There is no distension or abdominal bruit.     Palpations: Abdomen is soft. There is no shifting dullness, hepatomegaly, splenomegaly, mass or pulsatile mass.     Tenderness: There is abdominal tenderness in the epigastric area, left upper quadrant and left lower quadrant. There is no right CVA tenderness, left CVA tenderness, guarding or rebound. Negative signs include Murphy's sign and McBurney's sign.     Hernia: No hernia is present.  Musculoskeletal:     Cervical back: Normal range of motion and neck supple.     Right lower leg: No edema.     Left lower leg: No edema.  Lymphadenopathy:     Cervical: No cervical adenopathy.  Skin:    General: Skin is warm and dry.     Coloration: Skin is not pale.     Findings: No rash.  Neurological:     Mental Status: She is alert.     Coordination: Coordination normal.     Deep Tendon Reflexes: Reflexes are normal and symmetric. Reflexes normal.  Psychiatric:        Mood and Affect: Mood normal.           Assessment & Plan:   Problem List Items Addressed This Visit       Digestive   GERD - Primary     Other   Abdominal pain, left lateral    Pain and tenderness in LUQ and LLQ with past h/o diverticulosis (entire colon)  After course of augmentin-minimal improvement Some cp also  Rev notes/labs/studies  from visit with Dr Diona Browner and also ER (reassuring)  CT abd/pelv ordered (reviewed last) nexium 20 mg bid/bland diet  GI ref ER precautions discussed      Relevant Orders   Ambulatory referral to Gastroenterology   Abdominal tenderness, left upper quadrant   Relevant Orders   Ambulatory referral to Gastroenterology   Chest pain   Other Visit Diagnoses     Left lower quadrant abdominal pain       Relevant Orders   Ambulatory referral to Gastroenterology   CT Abdomen Pelvis W Contrast

## 2022-02-02 ENCOUNTER — Encounter: Payer: Self-pay | Admitting: Family Medicine

## 2022-02-02 ENCOUNTER — Ambulatory Visit (INDEPENDENT_AMBULATORY_CARE_PROVIDER_SITE_OTHER)
Admission: RE | Admit: 2022-02-02 | Discharge: 2022-02-02 | Disposition: A | Discharge status: Home / Self Care | Payer: Managed Care, Other (non HMO) | Source: Ambulatory Visit | Attending: Family Medicine | Admitting: Family Medicine

## 2022-02-02 DIAGNOSIS — R1032 Left lower quadrant pain: Secondary | ICD-10-CM

## 2022-02-02 MED ORDER — IOHEXOL 300 MG/ML  SOLN
100.0000 mL | Freq: Once | INTRAMUSCULAR | Status: AC | PRN
  Administered 2022-02-02: 100 mL via INTRAVENOUS

## 2022-03-01 ENCOUNTER — Ambulatory Visit (INDEPENDENT_AMBULATORY_CARE_PROVIDER_SITE_OTHER): Payer: Managed Care, Other (non HMO) | Admitting: Dermatology

## 2022-03-01 ENCOUNTER — Encounter: Payer: Self-pay | Admitting: Dermatology

## 2022-03-01 DIAGNOSIS — L821 Other seborrheic keratosis: Secondary | ICD-10-CM | POA: Diagnosis not present

## 2022-03-01 DIAGNOSIS — L814 Other melanin hyperpigmentation: Secondary | ICD-10-CM

## 2022-03-01 DIAGNOSIS — L578 Other skin changes due to chronic exposure to nonionizing radiation: Secondary | ICD-10-CM

## 2022-03-01 DIAGNOSIS — Z1283 Encounter for screening for malignant neoplasm of skin: Secondary | ICD-10-CM

## 2022-03-01 DIAGNOSIS — D1800 Hemangioma unspecified site: Secondary | ICD-10-CM

## 2022-03-01 DIAGNOSIS — D229 Melanocytic nevi, unspecified: Secondary | ICD-10-CM | POA: Diagnosis not present

## 2022-03-01 NOTE — Patient Instructions (Addendum)
Recommend taking Heliocare sun protection supplement daily in sunny weather for additional sun protection. For maximum protection on the sunniest days, you can take up to 2 capsules of regular Heliocare OR take 1 capsule of Heliocare Ultra. For prolonged exposure (such as a full day in the sun), you can repeat your dose of the supplement 4 hours after your first dose. Heliocare can be purchased at [facility], at some [facility] or at [email or web address].    Recommend daily broad spectrum sunscreen SPF 30+ to sun-exposed areas, reapply every 2 hours as needed. Call for new or changing lesions.  Staying in the shade or wearing long sleeves, sun glasses (UVA+UVB protection) and wide brim hats (4-inch brim around the entire circumference of the hat) are also recommended for sun protection.    Melanoma ABCDEs  Melanoma is the most dangerous type of skin cancer, and is the leading cause of death from skin disease.  You are more likely to develop melanoma if you: Have light-colored skin, light-colored eyes, or red or blond hair Spend a lot of time in the sun Tan regularly, either outdoors or in a tanning bed Have had blistering sunburns, especially during childhood Have a close family member who has had a melanoma Have atypical moles or large birthmarks  Early detection of melanoma is key since treatment is typically straightforward and cure rates are extremely high if we catch it early.   The first sign of melanoma is often a change in a mole or a new dark spot.  The ABCDE system is a way of remembering the signs of melanoma.  A for asymmetry:  The two halves do not match. B for border:  The edges of the growth are irregular. C for color:  A mixture of colors are present instead of an even brown color. D for diameter:  Melanomas are usually (but not always) greater than 6mm - the size of a pencil eraser. E for evolution:  The spot keeps changing in size, shape, and color.  Please check  your skin once per month between visits. You can use a small mirror in front and a large mirror behind you to keep an eye on the back side or your body.   If you see any new or changing lesions before your next follow-up, please call to schedule a visit.  Please continue daily skin protection including broad spectrum sunscreen SPF 30+ to sun-exposed areas, reapplying every 2 hours as needed when you're outdoors.   Staying in the shade or wearing long sleeves, sun glasses (UVA+UVB protection) and wide brim hats (4-inch brim around the entire circumference of the hat) are also recommended for sun protection.    Due to recent changes in healthcare laws, you may see results of your pathology and/or laboratory studies on MyChart before the doctors have had a chance to review them. We understand that in some cases there may be results that are confusing or concerning to you. Please understand that not all results are received at the same time and often the doctors may need to interpret multiple results in order to provide you with the best plan of care or course of treatment. Therefore, we ask that you please give us 2 business days to thoroughly review all your results before contacting the office for clarification. Should we see a critical lab result, you will be contacted sooner.   If You Need Anything After Your Visit  If you have any questions or concerns for your doctor, please   call our main line at [phone] and press option 4 to reach your doctor's medical assistant. If no one answers, please leave a voicemail as directed and we will return your call as soon as possible. Messages left after 4 pm will be answered the following business day.   You may also send us a message via MyChart. We typically respond to MyChart messages within 1-2 business days.  For prescription refills, please ask your pharmacy to contact our office. Our fax number is [phone].  If you have an urgent issue when the  clinic is closed that cannot wait until the next business day, you can page your doctor at the number below.    Please note that while we do our best to be available for urgent issues outside of office hours, we are not available 24/7.   If you have an urgent issue and are unable to reach us, you may choose to seek medical care at your doctor's office, retail clinic, urgent care center, or emergency room.  If you have a medical emergency, please immediately call 911 or go to the emergency department.  Pager Numbers  - Dr. Kowalski: [phone]  - Dr. Moye: [phone]  - Dr. Stewart: [phone]  In the event of inclement weather, please call our main line at [phone] for an update on the status of any delays or closures.  Dermatology Medication Tips: Please keep the boxes that topical medications come in in order to help keep track of the instructions about where and how to use these. Pharmacies typically print the medication instructions only on the boxes and not directly on the medication tubes.   If your medication is too expensive, please contact our office at [phone] option 4 or send us a message through MyChart.   We are unable to tell what your co-pay for medications will be in advance as this is different depending on your insurance coverage. However, we may be able to find a substitute medication at lower cost or fill out paperwork to get insurance to cover a needed medication.   If a prior authorization is required to get your medication covered by your insurance company, please allow us 1-2 business days to complete this process.  Drug prices often vary depending on where the prescription is filled and some pharmacies may offer cheaper prices.  The website www.goodrx.com contains coupons for medications through different pharmacies. The prices here do not account for what the cost may be with help from insurance (it may be cheaper with your insurance), but the  website can give you the price if you did not use any insurance.  - You can print the associated coupon and take it with your prescription to the pharmacy.  - You may also stop by our office during regular business hours and pick up a GoodRx coupon card.  - If you need your prescription sent electronically to a different pharmacy, notify our office through [facility] MyChart or by phone at [phone] option 4.     Si Usted Necesita Algo Despus de Su Visita  Tambin puede enviarnos un mensaje a travs de MyChart. Por lo general respondemos a los mensajes de MyChart en el transcurso de 1 a 2 das hbiles.  Para renovar recetas, por favor pida a su farmacia que se ponga en contacto con nuestra oficina. Nuestro nmero de fax es el [phone].  Si tiene un asunto urgente cuando la clnica est cerrada y que no puede esperar hasta el siguiente da hbil,   puede llamar/localizar a su doctor(a) al nmero que aparece a continuacin.   Por favor, tenga en cuenta que aunque hacemos todo lo posible para estar disponibles para asuntos urgentes fuera del horario de oficina, no estamos disponibles las 24 horas del da, los 7 das de la semana.   Si tiene un problema urgente y no puede comunicarse con nosotros, puede optar por buscar atencin mdica  en el consultorio de su doctor(a), en una clnica privada, en un centro de atencin urgente o en una sala de emergencias.  Si tiene una emergencia mdica, por favor llame inmediatamente al 911 o vaya a la sala de emergencias.  Nmeros de bper  - Dr. Kowalski: [phone]  - Dra. Moye: [phone]  - Dra. Stewart: [phone]  En caso de inclemencias del tiempo, por favor llame a nuestra lnea principal al [phone] para una actualizacin sobre el estado de cualquier retraso o cierre.  Consejos para la medicacin en dermatologa: Por favor, guarde las cajas en las que vienen los medicamentos de uso tpico para ayudarle a seguir las  instrucciones sobre dnde y cmo usarlos. Las farmacias generalmente imprimen las instrucciones del medicamento slo en las cajas y no directamente en los tubos del medicamento.   Si su medicamento es muy caro, por favor, pngase en contacto con nuestra oficina llamando al [phone] y presione la opcin 4 o envenos un mensaje a travs de MyChart.   No podemos decirle cul ser su copago por los medicamentos por adelantado ya que esto es diferente dependiendo de la cobertura de su seguro. Sin embargo, es posible que podamos encontrar un medicamento sustituto a menor costo o llenar un formulario para que el seguro cubra el medicamento que se considera necesario.   Si se requiere una autorizacin previa para que su compaa de seguros cubra su medicamento, por favor permtanos de 1 a 2 das hbiles para completar este proceso.  Los precios de los medicamentos varan con frecuencia dependiendo del lugar de dnde se surte la receta y alguna farmacias pueden ofrecer precios ms baratos.  El sitio web www.goodrx.com tiene cupones para medicamentos de diferentes farmacias. Los precios aqu no tienen en cuenta lo que podra costar con la ayuda del seguro (puede ser ms barato con su seguro), pero el sitio web puede darle el precio si no utiliz ningn seguro.  - Puede imprimir el cupn correspondiente y llevarlo con su receta a la farmacia.  - Tambin puede pasar por nuestra oficina durante el horario de atencin regular y recoger una tarjeta de cupones de GoodRx.  - Si necesita que su receta se enve electrnicamente a una farmacia diferente, informe a nuestra oficina a travs de MyChart de [facility] o por telfono llamando al [phone] y presione la opcin 4.  

## 2022-03-01 NOTE — Progress Notes (Signed)
   Follow-Up Visit   Subjective  Makayla [name] is a 61 y.o. female who presents for the following: Annual Exam (Here for skin cancer screening. Full body. Hx of Ak's. No personal hx of skin cancer or dysplastic nevi).  The patient presents for Total-Body Skin Exam (TBSE) for skin cancer screening and mole check.  The patient has spots, moles and lesions to be evaluated, some may be new or changing and the patient has concerns that these could be cancer.   The following portions of the chart were reviewed this encounter and updated as appropriate:  Tobacco  Allergies  Meds  Problems  Med Hx  Surg Hx  Fam Hx      Review of Systems: No other skin or systemic complaints except as noted in HPI or Assessment and Plan.   Objective  Well appearing patient in no apparent distress; mood and affect are within normal limits.  A full examination was performed including scalp, head, eyes, ears, nose, lips, neck, chest, axillae, abdomen, back, buttocks, bilateral upper extremities, bilateral lower extremities, hands, feet, fingers, toes, fingernails, and toenails. All findings within normal limits unless otherwise noted below.  left cheek Stuck-on, thin, waxy, tan-brown papule  --Discussed benign etiology and prognosis.    Assessment & Plan   Lentigines - Scattered tan macules - Due to sun exposure - Benign-appearing, observe - Recommend daily broad spectrum sunscreen SPF 30+ to sun-exposed areas, reapply every 2 hours as needed. - Call for any changes  Seborrheic Keratoses - Stuck-on, waxy, tan-brown papules and/or plaques  - Benign-appearing - Discussed benign etiology and prognosis. - Observe - Call for any changes  Melanocytic Nevi - Tan-brown and/or pink-flesh-colored symmetric macules and papules - Benign appearing on exam today - Observation - Call clinic for new or changing moles - Recommend daily use of broad spectrum spf 30+ sunscreen to sun-exposed areas.    Hemangiomas - Red papules - Discussed benign nature - Observe - Call for any changes  Actinic Damage - Chronic condition, secondary to cumulative UV/sun exposure - diffuse scaly erythematous macules with underlying dyspigmentation - Recommend daily broad spectrum sunscreen SPF 30+ to sun-exposed areas, reapply every 2 hours as needed.  - Staying in the shade or wearing long sleeves, sun glasses (UVA+UVB protection) and wide brim hats (4-inch brim around the entire circumference of the hat) are also recommended for sun protection.  - Call for new or changing lesions.  Skin cancer screening performed today.  Seborrheic keratosis left cheek  Reassured benign age-related growth.  Recommend observation.  Discussed cryotherapy if spot(s) become irritated or inflamed.   Return in about 1 year (around 03/02/2023) for TBSE.  I, Emelia Salisbury, CMA, am acting as scribe for Forest Gleason, MD.  Documentation: I have reviewed the above documentation for accuracy and completeness, and I agree with the above.  Forest Gleason, MD

## 2022-03-05 ENCOUNTER — Encounter: Payer: Self-pay | Admitting: Dermatology

## 2022-03-05 ENCOUNTER — Encounter: Payer: Self-pay | Admitting: Nurse Practitioner

## 2022-03-05 ENCOUNTER — Ambulatory Visit: Payer: Managed Care, Other (non HMO) | Admitting: Nurse Practitioner

## 2022-03-05 VITALS — BP 124/82 | HR 81 | Ht 63.0 in | Wt 220.0 lb

## 2022-03-05 DIAGNOSIS — R079 Chest pain, unspecified: Secondary | ICD-10-CM | POA: Diagnosis not present

## 2022-03-05 DIAGNOSIS — K219 Gastro-esophageal reflux disease without esophagitis: Secondary | ICD-10-CM | POA: Diagnosis not present

## 2022-03-05 DIAGNOSIS — R1012 Left upper quadrant pain: Secondary | ICD-10-CM

## 2022-03-05 DIAGNOSIS — Z1211 Encounter for screening for malignant neoplasm of colon: Secondary | ICD-10-CM | POA: Diagnosis not present

## 2022-03-05 NOTE — Patient Instructions (Addendum)
Acid Reflux  Below are some measures you can take to possibly improve acid reflux symptoms . We may have discussed some of these today in the office. Not everything on this list may apply to you   --If you are taking anti-reflux ( GERD) medication be sure to take it 30 minutes before breakfast and if taking twice daily then also second dose should be 30 minutes before dinner.   --Avoid late meals / bedtime snacks.   --Avoid trigger foods ( foods which you know tend to aggravate you reflux symptoms). Some common trigger foods include spicy foods, fatty foods, acidic foods, chocolate and caffeine.  --Elevate the head of bed 6-8 inches on blocks or bricks. If not able to elevate the head of the bed consider purchasing a wedge pillow to sleep on.    --Weight reduction / maintain a healthy BMI ( body mass index) may be help with reflux symptoms  --Sometimes with the above mentioned "lifestyle changes" patients are able to reduce the amount of GERD medications they take. Our goal is to have you on the lowest effective dose of medication   If you are age 9 or older, your body mass index should be between 23-30. Your Body mass index is 38.97 kg/m. If this is out of the aforementioned range listed, please consider follow up with your Primary Care Provider.  If you are age 20 or younger, your body mass index should be between 19-25. Your Body mass index is 38.97 kg/m. If this is out of the aformentioned range listed, please consider follow up with your Primary Care Provider.   ________________________________________________________  The [facility] providers would like to encourage you to use [facility] to communicate with providers for non-urgent requests or questions.  Due to long hold times on the telephone, sending your provider a message by [facility] may be a faster and more efficient way to get a response.  Please allow 48 business hours for a response.  Please remember that this is for non-urgent  requests.  _______________________________________________________   I appreciate the  opportunity to care for you  Thank You   [place]

## 2022-03-05 NOTE — Progress Notes (Signed)
____________________________________________________________  Attending physician addendum:  Thank you for sending this case to me. I have reviewed the entire note and agree with the plan.  Agree this pain sounds atypical for most upper digestive conditions, but EGD reasonable. When she is scheduled for the EGD and colonoscopy, she needs close attention to preprocedure dietary restrictions as well as a split dose GoLytely prep.  (Fair prep on last exam)  Wilfrid Lund, MD  ____________________________________________________________

## 2022-03-05 NOTE — Progress Notes (Signed)
Chief Complaint:  abdominal pain   Assessment &  Plan   # 61 yo female with chronic LUQ pain, chronic GERD, now with new "achy" chest pain since May.  Scheduled for stress test next month.  The new chest discomfort is hard to sort as she has also been having more heartburn lately and the chest pain radiates down and under left breast where she has chronic LUQ pain.  CT scans in Oct 2022 and June 2023 for evaluation of abdominal pain were unrevealing. LUQ pain possibly musculoskeletal. Gastritis, PUD also on list of possibilities.  Continue Bid Nexium Will see her back following stress test. If cardiac evaluation is negative will schedule for EGD to be done at time of colonoscopy  Discussed anti-reflux measures such as avoidance of late meals / bedtime snacks, HOB elevation (or use of wedge pillow), weight reduction ( if applicable)  / maintaining a healthy BMI ( body mass index),  and avoidance of trigger foods and caffeine.   # Colon cancer screening. Colonoscopy in March 2022 with only a fair prep. During the procedure patient became hypoxic ( ? Aspirated). Repeat exam recommended for 6-12 months but wasn't done. Will scheduled for colonoscopy when I see her in follow-up  # Steatosis. Normal liver chemistries June 2023 She is aware of this diagnosis. She is trying to lose weight.   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 Loletha Carrow with a past medical history significant for diverticulosis, chronic LUQ pain, GERD, hypertension, hyperlipidemia, obesity, anxiety, cholecystectomy, hysterectomy, history of rectocele repair  Makayla [name] has a history of chronic LUQ pain.  She tells me that the area is just always sore. She has been having chest discomfort since May.  The discomfort often occurs after eating . It starts in lower chest, radiates downward and under her left breast .  The chest discomfort does not radiate upward into her  jaws nor down her arms nor through to her back.  The chest discomfort is not exertional. No asssociated SOB. She has also been having some breakthrough heartburn on twice daily PPI . Some mild nausea but no vomiting. Her weight is stable though she has been walking and trying to lose weight.   Makayla [name] was seen in the ED 01/30/2022 for evaluation of this chest pain / palpitations / tachycardia.  The pain did not seem to be cardiac in nature.  Troponin was normal.  D-dimer was normal.  EKG similar to prior. She has seen Cardiology and is scheduled for stress test on 8/7.   At some point several weeks ago she was treated empirically for diverticulitis. She doesn't feel like it helped her symptoms which are the same as what they are now. She hasn't had any lower abdominal pain.  CT scan of abd / pelvis on 6/30 without acute findings. Moderate stool in colon   Previous GI Evaluation   March 2022 Screening colonoscopy  - Preparation of the colon was fair, particularly in the left colon. - Diverticulosis in the entire examined colon. - Stool in the sigmoid colon and in the descending colon. - No specimens collected.   Imaging   Oct 2022 CTAP w contrast IMPRESSION: 1. No acute abnormality. No evidence of bowel obstruction or acute bowel inflammation. Marked diffuse colonic diverticulosis, with no evidence of acute diverticulitis. 2. Small chronic enterocele and rectocele. 3. Diffuse hepatic steatosis. 4. Small to moderate hiatal hernia.  5. Stable left adrenal adenoma. 6. Aortic Atherosclerosis (ICD10-I70.0).    02/02/22 CTAP w contrast  IMPRESSION: Colonic diverticulosis without evidence superimposed acute diverticulitis. Moderate colonic stool burden without evidence of enteric obstruction. Small hiatal hernia.  Hepatic steatosis. Duodenal diverticulum  Labs:     Latest Ref Rng & Units 01/30/2022    4:30 PM 01/11/2022    9:25 AM 05/29/2021    9:33 AM  CBC  WBC 4.0 - 10.5 K/uL 11.3  8.7   8.1   Hemoglobin 12.0 - 15.0 g/dL 14.2  13.6  14.1   Hematocrit 36.0 - 46.0 % 45.7  41.8  43.1   Platelets 150 - 400 K/uL 181  172.0  147.0        Latest Ref Rng & Units 01/11/2022    9:25 AM 05/29/2021    9:33 AM 05/13/2021   10:24 AM  Hepatic Function  Total Protein 6.0 - 8.3 g/dL 6.7  6.8  7.3   Albumin 3.5 - 5.2 g/dL 4.2  4.4  4.1   AST 0 - 37 U/L _0 ALT 0 - 35 U/L _1 Alk Phosphatase 39 - 117 U/L 95  95  89   Total Bilirubin 0.2 - 1.2 mg/dL 0.5  0.5  0.6   Bilirubin, Direct 0.0 - 0.2 mg/dL   <0.1      Past Medical History:  Diagnosis Date   Allergy    pollen   Anxiety    Bursitis, trochanteric    bi/lat    Dental crowns present    x 4   Diverticulosis    GERD (gastroesophageal reflux disease)    Hyperlipidemia    no current med.   Hypertension    white coat syndrome,   Mass of breast, left 01/2012   Obesity     Past Surgical History:  Procedure Laterality Date   ABDOMINAL HYSTERECTOMY     partial   BREAST EXCISIONAL BIOPSY Left 2013   BREAST SURGERY  01/10/2012   lumpectomy - left (excisional biopsy benign)   CHOLECYSTECTOMY N/A 02/10/2016   Procedure: LAPAROSCOPIC CHOLECYSTECTOMY WITH INTRAOPERATIVE CHOLANGIOGRAM;  Surgeon: Autumn Messing III, MD;  Location: [facility];  Service: General;  Laterality: N/A;   COLONOSCOPY  04/06/2010   D Brodie, normal w/tics   RECTOCELE REPAIR  06/14/2011   cystocele repair, pt states Dr Vikki Ports did not do rectocele and said she had to come back later.   ROBOTIC ASSISTED LAPAROSCOPIC SACROCOLPOPEXY  06/14/2011   Procedure: ROBOTIC ASSISTED LAPAROSCOPIC SACROCOLPOPEXY;  Surgeon: Dutch Gray, MD;  Location: [facility];  Service: Urology;  Laterality: N/A;   TUBAL LIGATION      Current Medications, Allergies, Family History and Social History were reviewed in [employer] record.     Current Outpatient Medications  Medication Sig Dispense Refill   amLODipine (NORVASC) 2.5 MG tablet Take 1  tablet (2.5 mg total) by mouth daily. 90 tablet 3   amoxicillin-clavulanate (AUGMENTIN) 875-125 MG tablet Take 1 tablet by mouth 2 (two) times daily. (Patient not taking: Reported on 02/01/2022) 20 tablet 0   b complex vitamins capsule Take 1 capsule by mouth daily.     esomeprazole (NEXIUM) 20 MG capsule Take 20 mg by mouth daily before breakfast.      fluticasone (FLONASE) 50 MCG/ACT nasal spray Place 2 sprays into both nostrils daily as needed. 48 g 3   hydrOXYzine (ATARAX) 25 MG tablet Take 1 tablet (25  mg total) by mouth at bedtime as needed. 30 tablet 3   ibuprofen (ADVIL) 800 MG tablet TAKE 1 TABLET BY MOUTH EVERY 8 HOURS AS NEEDED FOR PAIN 90 tablet 1   ketoconazole (NIZORAL) 2 % cream Apply to affected fingernails 1-2 times daily as needed 15 g 0   sertraline (ZOLOFT) 25 MG tablet Take 1 tablet (25 mg total) by mouth daily. 90 tablet 3   Vitamin D, Cholecalciferol, 25 MCG (1000 UT) TABS Take 5,000 Units by mouth daily.     Vitamin E 200 units TABS Take by mouth.     No current facility-administered medications for this visit.    Review of Systems: No fevers. No shortness of breath. No urinary complaints.    Physical Exam  Wt Readings from Last 3 Encounters:  02/01/22 219 lb 3.2 oz (99.4 kg)  01/30/22 212 lb (96.2 kg)  01/11/22 220 lb 2 oz (99.8 kg)    BP 124/82   Pulse 81   Ht _0  (1.6 m)   Wt 220 lb (99.8 kg)   SpO2 99%   BMI 38.97 kg/m  Constitutional:  Generally well appearing female in no acute distress. Psychiatric: Pleasant. Normal mood and affect. Behavior is normal. EENT: Pupils normal.  Conjunctivae are normal. No scleral icterus. Neck supple.  Cardiovascular: Normal rate, regular rhythm. No edema Pulmonary/chest: Effort normal and breath sounds normal. No wheezing, rales or rhonchi. Abdominal: Soft, nondistended, mild LUQ tenderness.  Bowel sounds active throughout. There are no masses palpable. No hepatomegaly. Musculoskeletal: no chest wall  tenderness Neurological: Alert and oriented to person place and time. Skin: Skin is warm and dry. No rashes noted.  Tye Savoy, NP  03/05/2022, 8:14 AM I spent 30 minutes total reviewing records, obtaining history, performing exam, counseling patient and documenting visit / findings.   Cc:  Tower, Wynelle Fanny, MD

## 2022-04-17 ENCOUNTER — Ambulatory Visit: Payer: Managed Care, Other (non HMO) | Admitting: Nurse Practitioner

## 2022-05-23 ENCOUNTER — Other Ambulatory Visit: Payer: Self-pay | Admitting: Family Medicine

## 2022-05-23 DIAGNOSIS — Z1231 Encounter for screening mammogram for malignant neoplasm of breast: Secondary | ICD-10-CM

## 2022-05-23 IMAGING — CR DG CHEST 2V
2 series · 2 of 2 positions shown · non-contrast
Comparison: Chest radiograph 10/01/2018

CLINICAL DATA: Pt reports lower abd pain since [REDACTED] and then
today it radiated up to her mid chest. Pt reports some nausea as
well and describes the pain as sharp, burning and like a big cramp

EXAM:
CHEST - 2 VIEW

[chest pa]
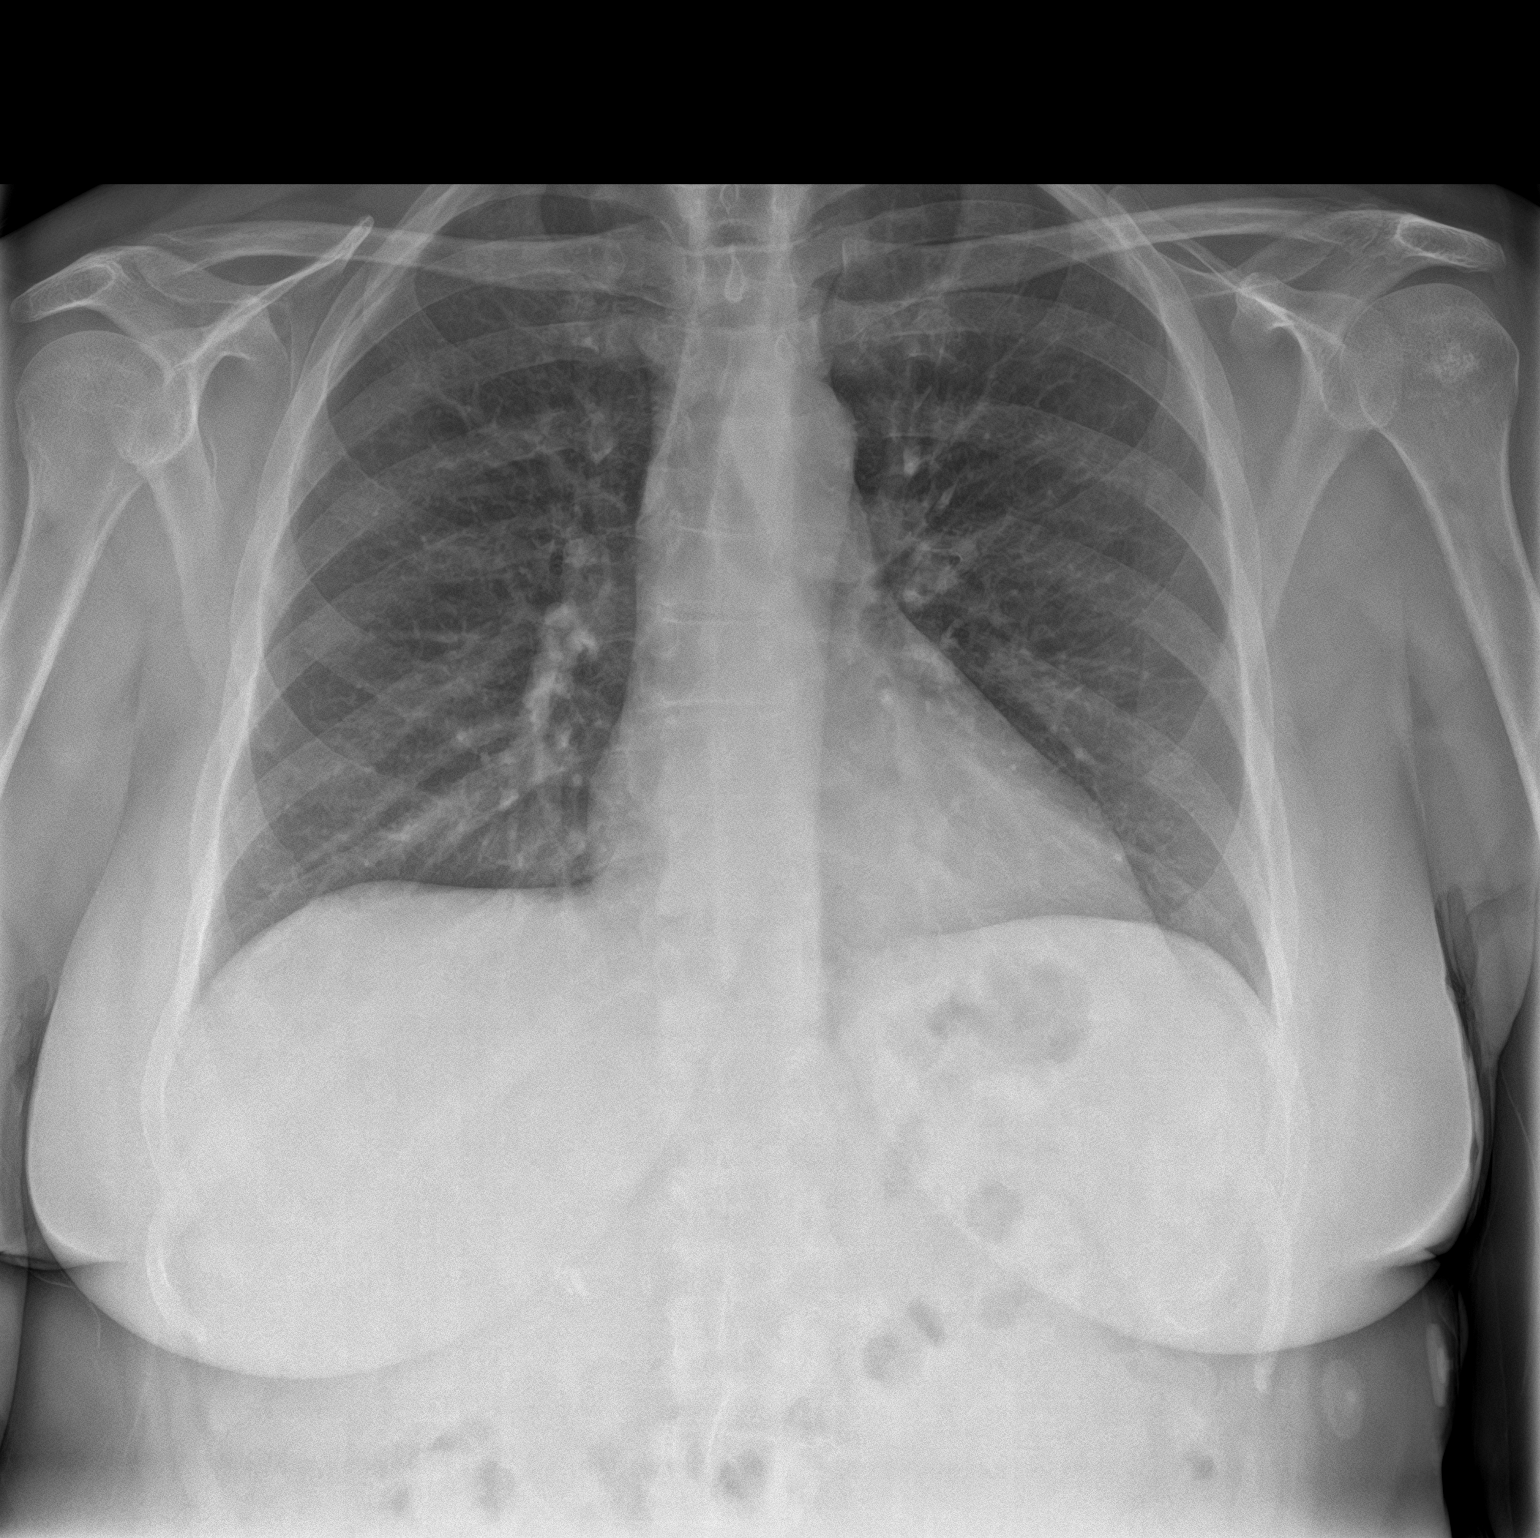

[chest lat]
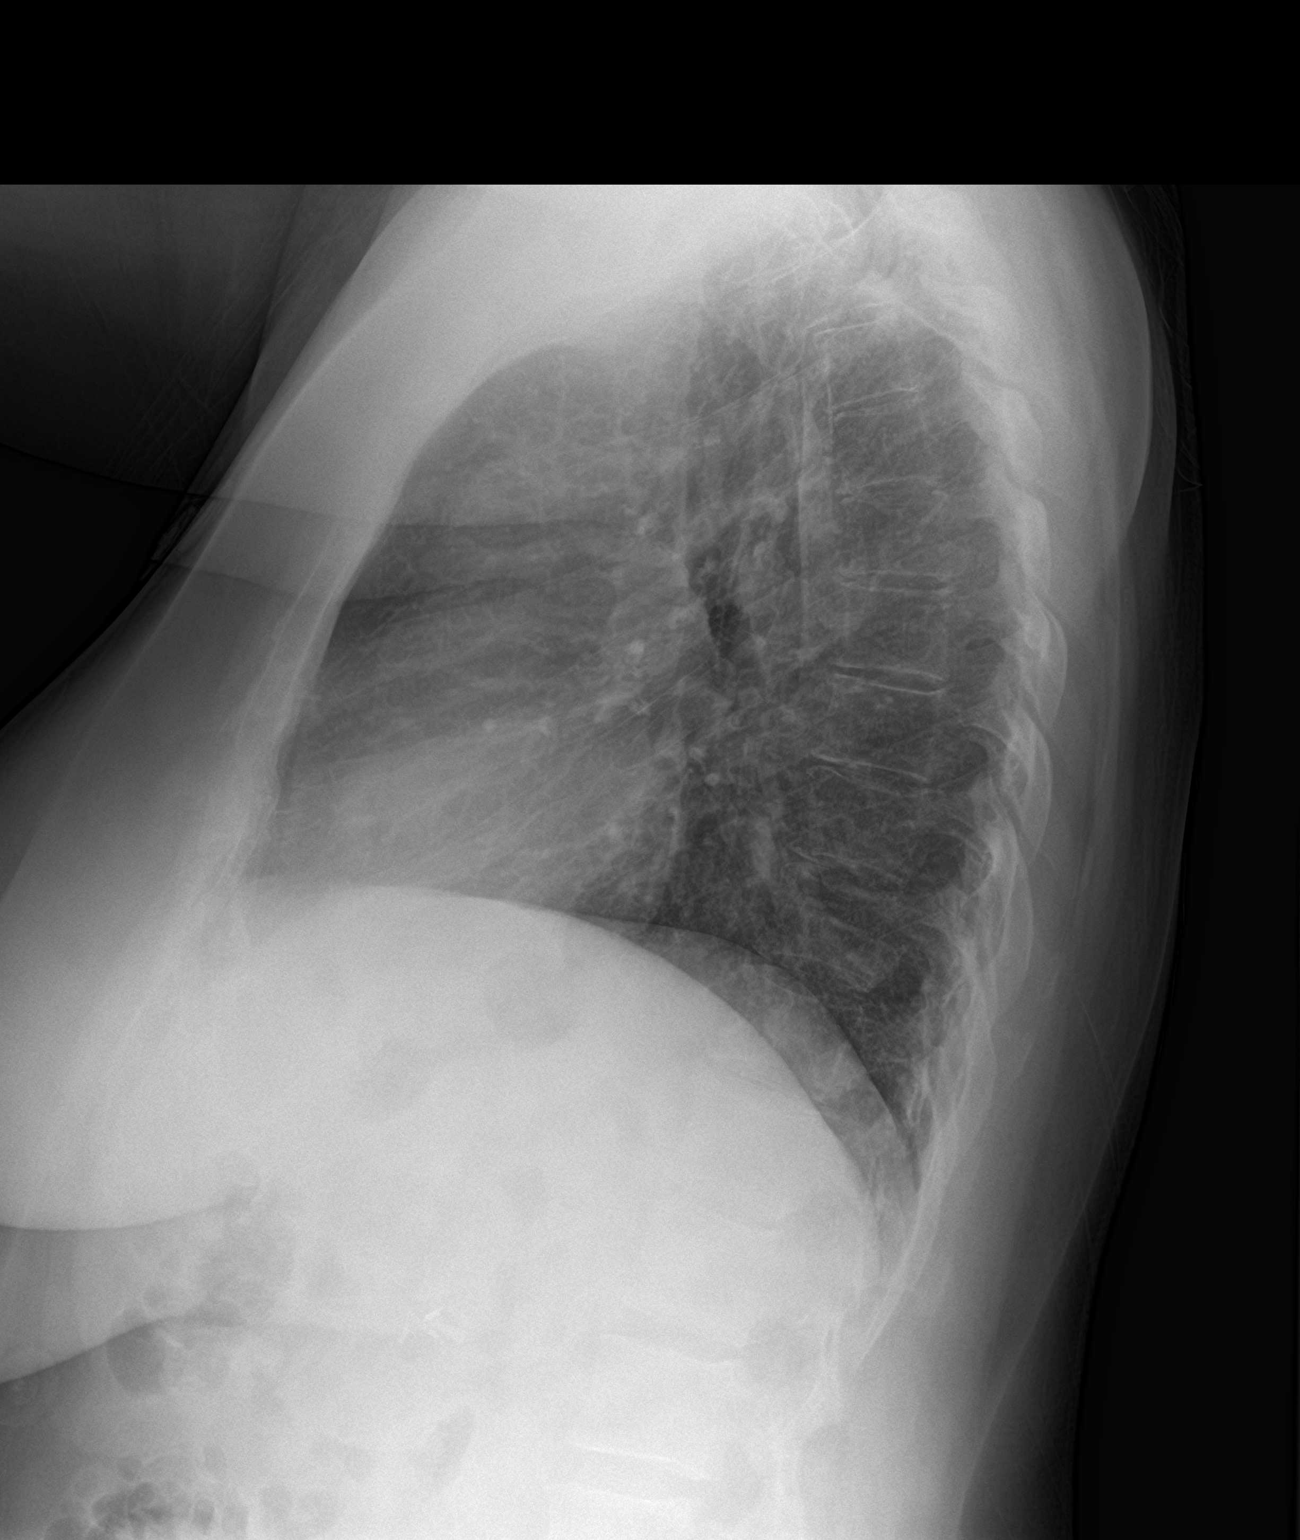

[2 of 2 positions shown; findings below may reference images not displayed]

FINDINGS: The cardiomediastinal contours are within normal limits. The lungs
are clear. No pneumothorax or pleural effusion. No acute finding in
the visualized skeleton. The
IMPRESSION: No active cardiopulmonary disease.

## 2022-05-24 ENCOUNTER — Encounter: Payer: Self-pay | Admitting: Nurse Practitioner

## 2022-05-24 ENCOUNTER — Ambulatory Visit: Payer: Managed Care, Other (non HMO) | Admitting: Nurse Practitioner

## 2022-05-24 VITALS — BP 128/72 | HR 95 | Ht 63.0 in | Wt 217.1 lb

## 2022-05-24 DIAGNOSIS — Z1211 Encounter for screening for malignant neoplasm of colon: Secondary | ICD-10-CM

## 2022-05-24 DIAGNOSIS — Z1212 Encounter for screening for malignant neoplasm of rectum: Secondary | ICD-10-CM

## 2022-05-24 DIAGNOSIS — K219 Gastro-esophageal reflux disease without esophagitis: Secondary | ICD-10-CM | POA: Diagnosis not present

## 2022-05-24 DIAGNOSIS — R1012 Left upper quadrant pain: Secondary | ICD-10-CM

## 2022-05-24 DIAGNOSIS — R110 Nausea: Secondary | ICD-10-CM

## 2022-05-24 MED ORDER — NA SULFATE-K SULFATE-MG SULF 17.5-3.13-1.6 GM/177ML PO SOLN: 1.0000 | Freq: Once | ORAL | 0 refills | Status: AC

## 2022-05-24 NOTE — Progress Notes (Signed)
Chief Complaint: discuss EGD and colonoscopy after cardiac work-up to    Assessment &  Plan   # 61 yo female for colon cancer screening. She is overdue for screening. No blood in stool. No [place] of colon cancer.  Schedule for a colonoscopy. The risks and benefits of colonoscopy with possible polypectomy / biopsies were discussed and the patient agrees to proceed.  She had some respiratory distress during last colonoscopy. Post procedure went to urgent care and diagnosed with aspiration PNA.  Will give a split dose GoLytely prep.  Fair prep on last exam  # Chronic LUQ pain, sometimes with associated nausea. Pain often meal related but can also occur with bending.  Definitely seems to have a musculoskeletal component but since meal related need to also think about gastritis, PUD.  Schedule EGD to be done at time of colonoscopy.  The risks and benefits of EGD with possible biopsies were discussed with the patient who agrees to proceed.   #Chronic GERD, overall asymptomatic on twice daily Nexium  # Chest pain, resolved. Recently completed Cardiology evaluation.    HPI   Patient is a 61 year old female known to Dr. Loletha Carrow with a past medical history significant for diverticulosis, chronic LUQ pain, GERD, hypertension, hyperlipidemia, obesity, anxiety, cholecystectomy, hysterectomy, history of rectocele repair.   See PMH /PSH for additional history   Makayla [name] was last seen July 2023 for evaluation of chronic LUQ pain, chronic GERD and new chest pain.  She was scheduled for stress test the following month.  Her LUQ pain was felt to be possibly musculoskeletal.  However gastritis, PUD also on the list of possibilities.  Plan was to see her back following her stress test.  Cardiac relation was negative then we will schedule her for an EGD to be done at the time of her colon cancer screening for which she was overdue.    Interval History:   She was last seen by cardiology 03/28/2022.  She had an ETT  early August chest pain or ischemic ECG changes.  Cardiac standpoint she seems to be doing well at the time of that visit.  No further cardiac testing was planned at that time.  She has not had any further chest pain.  No shortness of breath.  She does continue to have LUQ pain after eating, especially with greasy or spicy foods. Pain sometimes associated with nausea. She also gets the same pain sometimes with bending. Taking nexium BID for GERD which is overall well controlled.   Previous GI Evaluation   March 2022 screening colonoscopy -Fair prep.  Diverticulosis of the entire colon.  Stool in the sigmoid colon and descending colon.  Stool debris in the remainder of the colon partially clearing with lavage of the left colon could not be cleared.  Procedure terminated early due to respiratory distress.  Plan was to repeat the colonoscopy in 6 to 12 months with a 2-day bowel prep   Imaging   Labs:     Latest Ref Rng & Units 01/30/2022    4:30 PM 01/11/2022    9:25 AM 05/29/2021    9:33 AM  CBC  WBC 4.0 - 10.5 K/uL 11.3  8.7  8.1   Hemoglobin 12.0 - 15.0 g/dL 14.2  13.6  14.1   Hematocrit 36.0 - 46.0 % 45.7  41.8  43.1   Platelets 150 - 400 K/uL 181  172.0  147.0        Latest Ref Rng & Units 01/11/2022  9:25 AM 05/29/2021    9:33 AM 05/13/2021   10:24 AM  Hepatic Function  Total Protein 6.0 - 8.3 g/dL 6.7  6.8  7.3   Albumin 3.5 - 5.2 g/dL 4.2  4.4  4.1   AST 0 - 37 U/L _0 ALT 0 - 35 U/L _1 Alk Phosphatase 39 - 117 U/L 95  95  89   Total Bilirubin 0.2 - 1.2 mg/dL 0.5  0.5  0.6   Bilirubin, Direct 0.0 - 0.2 mg/dL   <0.1      Past Medical History:  Diagnosis Date   Allergy    pollen   Anxiety    Bursitis, trochanteric    bi/lat    Dental crowns present    x 4   Diverticulosis    GERD (gastroesophageal reflux disease)    Hyperlipidemia    no current med.   Hypertension    white coat syndrome,   Mass of breast, left 01/2012   Obesity     Past  Surgical History:  Procedure Laterality Date   ABDOMINAL HYSTERECTOMY     partial   BREAST EXCISIONAL BIOPSY Left 2013   BREAST SURGERY  01/10/2012   lumpectomy - left (excisional biopsy benign)   CHOLECYSTECTOMY N/A 02/10/2016   Procedure: LAPAROSCOPIC CHOLECYSTECTOMY WITH INTRAOPERATIVE CHOLANGIOGRAM;  Surgeon: Autumn Messing III, MD;  Location: [facility];  Service: General;  Laterality: N/A;   COLONOSCOPY  04/06/2010   D Brodie, normal w/tics   RECTOCELE REPAIR  06/14/2011   cystocele repair, pt states Dr Vikki Ports did not do rectocele and said she had to come back later.   ROBOTIC ASSISTED LAPAROSCOPIC SACROCOLPOPEXY  06/14/2011   Procedure: ROBOTIC ASSISTED LAPAROSCOPIC SACROCOLPOPEXY;  Surgeon: Dutch Gray, MD;  Location: [facility];  Service: Urology;  Laterality: N/A;   TUBAL LIGATION      Current Medications, Allergies, Family History and Social History were reviewed in [employer] record.     Current Outpatient Medications  Medication Sig Dispense Refill   amLODipine (NORVASC) 2.5 MG tablet Take 1 tablet (2.5 mg total) by mouth daily. 90 tablet 3   b complex vitamins capsule Take 1 capsule by mouth daily.     esomeprazole (NEXIUM) 20 MG capsule Take 20 mg by mouth 2 (two) times daily before a meal.     fluticasone (FLONASE) 50 MCG/ACT nasal spray Place 2 sprays into both nostrils daily as needed. 48 g 3   hydrOXYzine (ATARAX) 25 MG tablet Take 1 tablet (25 mg total) by mouth at bedtime as needed. 30 tablet 3   ibuprofen (ADVIL) 800 MG tablet TAKE 1 TABLET BY MOUTH EVERY 8 HOURS AS NEEDED FOR PAIN 90 tablet 1   ketoconazole (NIZORAL) 2 % cream Apply to affected fingernails 1-2 times daily as needed (Patient not taking: Reported on 03/05/2022) 15 g 0   sertraline (ZOLOFT) 25 MG tablet Take 1 tablet (25 mg total) by mouth daily. 90 tablet 3   Vitamin D, Cholecalciferol, 25 MCG (1000 UT) TABS Take 5,000 Units by mouth daily.     Vitamin E 200 units TABS Take by mouth.      No current facility-administered medications for this visit.    Review of Systems: No chest pain. No shortness of breath. No urinary complaints.    Physical Exam  Wt Readings from Last 3 Encounters:  03/05/22 220 lb (99.8 kg)  02/01/22 219 lb 3.2 oz (99.4 kg)  01/30/22 212 lb (96.2 kg)    BP 128/72   Pulse 95   Ht _0  (1.6 m)   Wt 217 lb 2 oz (98.5 kg)   BMI 38.46 kg/m  Constitutional:  Pleasant female in no acute distress. Psychiatric: Normal mood and affect. Behavior is normal. EENT: Pupils normal.  Conjunctivae are normal. No scleral icterus. Neck supple.  Cardiovascular: Normal rate, regular rhythm. No edema Pulmonary/chest: Effort normal and breath sounds normal. No wheezing, rales or rhonchi. Abdominal: Soft, nondistended, nontender. Bowel sounds active throughout. There are no masses palpable. No hepatomegaly. Neurological: Alert and oriented to person place and time. Skin: Skin is warm and dry. No rashes noted.  Tye Savoy, NP  05/24/2022, 8:05 AM

## 2022-05-24 NOTE — Patient Instructions (Signed)
_______________________________________________________  If you are age 62 or older, your body mass index should be between 23-30. Your Body mass index is 38.46 kg/m. If this is out of the aforementioned range listed, please consider follow up with your Primary Care Provider.  If you are age 6 or younger, your body mass index should be between 19-25. Your Body mass index is 38.46 kg/m. If this is out of the aformentioned range listed, please consider follow up with your Primary Care Provider.   You have been scheduled for an endoscopy and colonoscopy. Please follow the written instructions given to you at your visit today. Please pick up your prep supplies at the pharmacy within the next 1-3 days. If you use inhalers (even only as needed), please bring them with you on the day of your procedure.   The [facility] providers would like to encourage you to use [facility] to communicate with providers for non-urgent requests or questions.  Due to long hold times on the telephone, sending your provider a message by [facility] may be a faster and more efficient way to get a response.  Please allow 48 business hours for a response.  Please remember that this is for non-urgent requests.   It was a pleasure to see you today!  Thank you for trusting me with your gastrointestinal care!

## 2022-05-28 ENCOUNTER — Encounter: Payer: Self-pay | Admitting: *Deleted

## 2022-05-30 NOTE — Progress Notes (Signed)
____________________________________________________________  Attending physician addendum:  Thank you for sending this case to me. I have reviewed the entire note and agree with the plan.   Ariah Mower Danis, MD  ____________________________________________________________  

## 2022-06-06 ENCOUNTER — Telehealth: Payer: Self-pay | Admitting: Family Medicine

## 2022-06-06 DIAGNOSIS — E559 Vitamin D deficiency, unspecified: Secondary | ICD-10-CM

## 2022-06-06 DIAGNOSIS — E785 Hyperlipidemia, unspecified: Secondary | ICD-10-CM

## 2022-06-06 DIAGNOSIS — R7309 Other abnormal glucose: Secondary | ICD-10-CM

## 2022-06-06 DIAGNOSIS — I10 Essential (primary) hypertension: Secondary | ICD-10-CM

## 2022-06-06 DIAGNOSIS — R0781 Pleurodynia: Secondary | ICD-10-CM

## 2022-06-06 NOTE — Telephone Encounter (Signed)
-----   Message from Ellamae Sia sent at 05/28/2022 11:39 AM EDT ----- Regarding: Lab orders for Friday, 11.3.23 Patient is scheduled for CPX labs, please order future labs, Thanks , Karna Christmas

## 2022-06-06 NOTE — Telephone Encounter (Signed)
-----   Message from Ellamae Sia sent at 05/24/2022  3:54 PM EDT ----- Regarding: Lab orders for Friday, 11.3.23 Patient is scheduled for CPX labs, please order future labs, Thanks , Karna Christmas

## 2022-06-07 ENCOUNTER — Ambulatory Visit: Payer: Managed Care, Other (non HMO) | Admitting: Family Medicine

## 2022-06-07 ENCOUNTER — Encounter: Payer: Self-pay | Admitting: Family Medicine

## 2022-06-07 VITALS — BP 142/86 | HR 92 | Temp 97.7°F | Ht 63.0 in | Wt 218.4 lb

## 2022-06-07 DIAGNOSIS — K649 Unspecified hemorrhoids: Secondary | ICD-10-CM

## 2022-06-07 MED ORDER — HYDROCORTISONE ACETATE 25 MG RE SUPP: 25.0000 mg | Freq: Two times a day (BID) | RECTAL | 0 refills | Status: DC | PRN

## 2022-06-07 NOTE — Progress Notes (Signed)
Subjective:    Patient ID: Makayla [name], female    DOB: [birth year], 61 y.o.   MRN: [id number]  HPI Pt presents with possible hemorrhoid  Wt Readings from Last 3 Encounters:  06/07/22 218 lb 6 oz (99.1 kg)  05/24/22 217 lb 2 oz (98.5 kg)  03/05/22 220 lb (99.8 kg)   38.68 kg/m  Monday afternoon had some rectal pain  Got bad enough to take both tylenol and 800 mg ibuprofen  Started doing sitz baths  Most of the time it happens after a BM   Some discomfort if she sits on a hard surface  Not constipated  Does not have to strain much   She has some abd pain that she sees GI for  New probiotic caused bloating so she stopped it   She does have some tenderness when wiping  Then she can feel it with her hand   Has improved Continued tylenol Feels a lot of pressure  Wonders if her rectocele is worse Has appt with urogyn - may be time to consider procedure  No problems passing stool   No blood in stool  No black stool    Has never had hemorrhoids in the past    Had a colonoscopy 3/22 with tics  Also has a colonoscopy planned  Will do a 2 day prep    Getting ready to go to [place] with family   Patient Active Problem List   Diagnosis Date Noted   Hemorrhoids 06/07/2022   Abdominal pain, left lateral 02/01/2022   Rib pain on left side 06/13/2020   Abdominal tenderness, left upper quadrant 06/13/2020   Colon cancer screening 05/06/2020   Insomnia, psychophysiological 03/14/2020   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   Retro-orbital pain of left eye 03/14/2020   Pain head 02/25/2020   Pulsatile tinnitus of right ear 09/30/2019   Immunization reaction 09/08/2019   Grief reaction 10/03/2018   Elevated glucose level 01/16/2018   Generalized anxiety disorder 01/23/2017   Chest pain 11/02/2016   Vitamin D deficiency 05/20/2015   Obesity 05/20/2015   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   Routine general medical examination at  a health care facility 10/16/2011   Hyperlipidemia LDL goal <130 12/04/2007   GERD 12/04/2007   Essential hypertension 12/04/2007   Past Medical History:  Diagnosis Date   Allergy    pollen   Anxiety    Bursitis, trochanteric    bi/lat    Dental crowns present    x 4   Diverticulosis    GERD (gastroesophageal reflux disease)    Hyperlipidemia    no current med.   Hypertension    white coat syndrome,   Mass of breast, left 01/2012   Obesity    Past Surgical History:  Procedure Laterality Date   ABDOMINAL HYSTERECTOMY     partial   BREAST EXCISIONAL BIOPSY Left 2013   BREAST SURGERY  01/10/2012   lumpectomy - left (excisional biopsy benign)   CHOLECYSTECTOMY N/A 02/10/2016   Procedure: LAPAROSCOPIC CHOLECYSTECTOMY WITH INTRAOPERATIVE CHOLANGIOGRAM;  Surgeon: Autumn Messing III, MD;  Location: [facility];  Service: General;  Laterality: N/A;   COLONOSCOPY  04/06/2010   D Brodie, normal w/tics   RECTOCELE REPAIR  06/14/2011   cystocele repair, pt states Dr Vikki Ports did not do rectocele and said she had to come back later.   ROBOTIC ASSISTED LAPAROSCOPIC SACROCOLPOPEXY  06/14/2011   Procedure: ROBOTIC ASSISTED LAPAROSCOPIC SACROCOLPOPEXY;  Surgeon: Dutch Gray, MD;  Location: [facility];  Service: Urology;  Laterality: N/A;   TUBAL LIGATION     Social History   Tobacco Use   Smoking status: Never    Passive exposure: Past   Smokeless tobacco: Never  Vaping Use   Vaping Use: Never used  Substance Use Topics   Alcohol use: No    Alcohol/week: 0.0 standard drinks of alcohol   Drug use: No   Family History  Problem Relation Age of Onset   Hypertension Mother    Hyperlipidemia Mother    Diabetes Mother    Breast cancer Mother 56   Uterine cancer Mother    GER disease Father    Depression Sister    Heart disease Brother    Heart attack Maternal Grandmother    Hypertension Maternal Grandmother    Colon cancer Neg Hx    Colon polyps Neg Hx    Esophageal cancer Neg Hx    Rectal  cancer Neg Hx    Stomach cancer Neg Hx    Allergies  Allergen Reactions   Buspar [Buspirone]     Made her more anxious   Current Outpatient Medications on File Prior to Visit  Medication Sig Dispense Refill   amLODipine (NORVASC) 2.5 MG tablet Take 1 tablet (2.5 mg total) by mouth daily. 90 tablet 3   b complex vitamins capsule Take 1 capsule by mouth daily.     esomeprazole (NEXIUM) 20 MG capsule Take 20 mg by mouth 2 (two) times daily before a meal.     fluticasone (FLONASE) 50 MCG/ACT nasal spray Place 2 sprays into both nostrils daily as needed. 48 g 3   hydrOXYzine (ATARAX) 25 MG tablet Take 1 tablet (25 mg total) by mouth at bedtime as needed. 30 tablet 3   ibuprofen (ADVIL) 800 MG tablet TAKE 1 TABLET BY MOUTH EVERY 8 HOURS AS NEEDED FOR PAIN 90 tablet 1   ketoconazole (NIZORAL) 2 % cream Apply to affected fingernails 1-2 times daily as needed 15 g 0   sertraline (ZOLOFT) 25 MG tablet Take 1 tablet (25 mg total) by mouth daily. 90 tablet 3   Vitamin D, Cholecalciferol, 25 MCG (1000 UT) TABS Take 5,000 Units by mouth daily.     Vitamin E 200 units TABS Take by mouth.     No current facility-administered medications on file prior to visit.    Review of Systems  Constitutional:  Negative for activity change, appetite change, fatigue, fever and unexpected weight change.  HENT:  Negative for congestion, ear pain, rhinorrhea, sinus pressure and sore throat.   Eyes:  Negative for pain, redness and visual disturbance.  Respiratory:  Negative for cough, shortness of breath and wheezing.   Cardiovascular:  Negative for chest pain and palpitations.  Gastrointestinal:  Positive for rectal pain. Negative for abdominal distention, blood in stool, constipation, diarrhea and vomiting.  Endocrine: Negative for polydipsia and polyuria.  Genitourinary:  Negative for dysuria, frequency and urgency.  Musculoskeletal:  Negative for arthralgias, back pain and myalgias.  Skin:  Negative for pallor  and rash.  Allergic/Immunologic: Negative for environmental allergies.  Neurological:  Negative for dizziness, syncope and headaches.  Hematological:  Negative for adenopathy. Does not bruise/bleed easily.  Psychiatric/Behavioral:  Negative for decreased concentration and dysphoric mood. The patient is not nervous/anxious.        Objective:   Physical Exam Constitutional:      Appearance: Normal appearance. She is obese.  HENT:     Head: Normocephalic and atraumatic.  Eyes:  General: No scleral icterus.    Conjunctiva/sclera: Conjunctivae normal.     Pupils: Pupils are equal, round, and reactive to light.  Cardiovascular:     Rate and Rhythm: Normal rate and regular rhythm.     Heart sounds: Normal heart sounds.  Pulmonary:     Effort: Pulmonary effort is normal. No respiratory distress.     Breath sounds: Normal breath sounds. No wheezing.  Abdominal:     General: Abdomen is protuberant. Bowel sounds are normal.     Palpations: Abdomen is soft. There is no hepatomegaly, splenomegaly, mass or pulsatile mass.     Tenderness: There is no abdominal tenderness.  Genitourinary:    Rectum: Guaiac result negative. Internal hemorrhoid present. No mass or tenderness.     Comments: On anoscope, internal hemorrhoids at 5:00 and 6:00 that are not bleeding and do not appear to be thrombosed   No fissure noted   Skin:    General: Skin is warm and dry.     Coloration: Skin is not pale.     Findings: No erythema or rash.  Neurological:     Mental Status: She is alert.  Psychiatric:        Mood and Affect: Mood normal.           Assessment & Plan:   Problem List Items Addressed This Visit       Cardiovascular and Mediastinum   Hemorrhoids - Primary    Pt c/o some rectal pain that has imp with sitz baths  No constipation or straining Some hemorrhoids noted on exam / non thrombosed Disc imp of keeping stools soft Avoid statining  Handout given  Px for anusol ac supp to  use bid prn Update if not starting to improve in a week or if worsening

## 2022-06-07 NOTE — Assessment & Plan Note (Signed)
Pt c/o some rectal pain that has imp with sitz baths  No constipation or straining Some hemorrhoids noted on exam / non thrombosed Disc imp of keeping stools soft Avoid statining  Handout given  Px for anusol ac supp to use bid prn Update if not starting to improve in a week or if worsening

## 2022-06-07 NOTE — Patient Instructions (Addendum)
Keep your stools very soft so you don't strain   Use the anusol hc suppositories up to twice daily as needed    Avoid straining   If symptoms worsen let us know  Try not to sit overly long without getting up  Sitz baths are ok

## 2022-06-08 ENCOUNTER — Other Ambulatory Visit (INDEPENDENT_AMBULATORY_CARE_PROVIDER_SITE_OTHER): Payer: Managed Care, Other (non HMO)

## 2022-06-08 DIAGNOSIS — I10 Essential (primary) hypertension: Secondary | ICD-10-CM

## 2022-06-08 DIAGNOSIS — E785 Hyperlipidemia, unspecified: Secondary | ICD-10-CM

## 2022-06-08 DIAGNOSIS — E559 Vitamin D deficiency, unspecified: Secondary | ICD-10-CM

## 2022-06-08 DIAGNOSIS — R7309 Other abnormal glucose: Secondary | ICD-10-CM

## 2022-06-08 LAB — HEMOGLOBIN A1C: Hgb A1c MFr Bld: 6.3 % (ref 4.6–6.5)

## 2022-06-08 LAB — CBC WITH DIFFERENTIAL/PLATELET
Basophils Absolute: 0.1 10*3/uL (ref 0.0–0.1)
Basophils Relative: 0.8 % (ref 0.0–3.0)
Eosinophils Absolute: 0.2 10*3/uL (ref 0.0–0.7)
Eosinophils Relative: 1.8 % (ref 0.0–5.0)
HCT: 41.4 % (ref 36.0–46.0)
Hemoglobin: 13.5 g/dL (ref 12.0–15.0)
Lymphocytes Relative: 21 % (ref 12.0–46.0)
Lymphs Abs: 1.9 10*3/uL (ref 0.7–4.0)
MCHC: 32.7 g/dL (ref 30.0–36.0)
MCV: 80.3 fl (ref 78.0–100.0)
Monocytes Absolute: 0.6 10*3/uL (ref 0.1–1.0)
Monocytes Relative: 7.1 % (ref 3.0–12.0)
Neutro Abs: 6.2 10*3/uL (ref 1.4–7.7)
Neutrophils Relative %: 69.3 % (ref 43.0–77.0)
Platelets: 154 10*3/uL (ref 150.0–400.0)
RBC: 5.16 Mil/uL — ABNORMAL HIGH (ref 3.87–5.11)
RDW: 15.6 % — ABNORMAL HIGH (ref 11.5–15.5)
WBC: 8.9 10*3/uL (ref 4.0–10.5)

## 2022-06-08 LAB — LIPID PANEL
Cholesterol: 203 mg/dL — ABNORMAL HIGH (ref 0–200)
HDL: 53.9 mg/dL (ref >39.00)
LDL Cholesterol: 118 mg/dL — ABNORMAL HIGH (ref 0–99)
NonHDL: 149.15
Total CHOL/HDL Ratio: 4
Triglycerides: 156 mg/dL — ABNORMAL HIGH (ref 0.0–149.0)
VLDL: 31.2 mg/dL (ref 0.0–40.0)

## 2022-06-08 LAB — COMPREHENSIVE METABOLIC PANEL
ALT: 18 U/L (ref 0–35)
AST: 14 U/L (ref 0–37)
Albumin: 4.1 g/dL (ref 3.5–5.2)
Alkaline Phosphatase: 84 U/L (ref 39–117)
BUN: 8 mg/dL (ref 6–23)
CO2: 30 mEq/L (ref 19–32)
Calcium: 9.3 mg/dL (ref 8.4–10.5)
Chloride: 103 mEq/L (ref 96–112)
Creatinine, Ser: 0.84 mg/dL (ref 0.40–1.20)
GFR: 74.59 mL/min (ref >60.00)
Glucose, Bld: 120 mg/dL — ABNORMAL HIGH (ref 70–99)
Potassium: 4.2 mEq/L (ref 3.5–5.1)
Sodium: 139 mEq/L (ref 135–145)
Total Bilirubin: 0.4 mg/dL (ref 0.2–1.2)
Total Protein: 6.4 g/dL (ref 6.0–8.3)

## 2022-06-08 LAB — VITAMIN D 25 HYDROXY (VIT D DEFICIENCY, FRACTURES): VITD: 53.93 ng/mL (ref 30.00–100.00)

## 2022-06-08 LAB — TSH: TSH: 2.22 u[IU]/mL (ref 0.35–5.50)

## 2022-06-11 ENCOUNTER — Other Ambulatory Visit: Payer: Managed Care, Other (non HMO)

## 2022-06-18 ENCOUNTER — Encounter: Payer: Self-pay | Admitting: Family Medicine

## 2022-06-18 ENCOUNTER — Ambulatory Visit: Payer: Managed Care, Other (non HMO) | Admitting: Obstetrics and Gynecology

## 2022-06-18 ENCOUNTER — Ambulatory Visit (INDEPENDENT_AMBULATORY_CARE_PROVIDER_SITE_OTHER): Payer: Managed Care, Other (non HMO) | Admitting: Family Medicine

## 2022-06-18 ENCOUNTER — Encounter: Payer: Self-pay | Admitting: Obstetrics and Gynecology

## 2022-06-18 VITALS — BP 122/80 | HR 85 | Ht 63.75 in | Wt 220.0 lb

## 2022-06-18 VITALS — BP 130/82 | HR 92 | Temp 98.3°F | Ht 63.0 in | Wt 218.6 lb

## 2022-06-18 DIAGNOSIS — E785 Hyperlipidemia, unspecified: Secondary | ICD-10-CM

## 2022-06-18 DIAGNOSIS — E559 Vitamin D deficiency, unspecified: Secondary | ICD-10-CM

## 2022-06-18 DIAGNOSIS — N816 Rectocele: Secondary | ICD-10-CM | POA: Diagnosis not present

## 2022-06-18 DIAGNOSIS — Z0000 Encounter for general adult medical examination without abnormal findings: Secondary | ICD-10-CM

## 2022-06-18 DIAGNOSIS — R7309 Other abnormal glucose: Secondary | ICD-10-CM

## 2022-06-18 DIAGNOSIS — I10 Essential (primary) hypertension: Secondary | ICD-10-CM | POA: Diagnosis not present

## 2022-06-18 DIAGNOSIS — N993 Prolapse of vaginal vault after hysterectomy: Secondary | ICD-10-CM | POA: Diagnosis not present

## 2022-06-18 DIAGNOSIS — Z1211 Encounter for screening for malignant neoplasm of colon: Secondary | ICD-10-CM | POA: Diagnosis not present

## 2022-06-18 DIAGNOSIS — Z23 Encounter for immunization: Secondary | ICD-10-CM

## 2022-06-18 DIAGNOSIS — K219 Gastro-esophageal reflux disease without esophagitis: Secondary | ICD-10-CM

## 2022-06-18 DIAGNOSIS — F411 Generalized anxiety disorder: Secondary | ICD-10-CM

## 2022-06-18 DIAGNOSIS — E6609 Other obesity due to excess calories: Secondary | ICD-10-CM

## 2022-06-18 DIAGNOSIS — Z6838 Body mass index (BMI) 38.0-38.9, adult: Secondary | ICD-10-CM

## 2022-06-18 MED ORDER — AMLODIPINE BESYLATE 2.5 MG PO TABS: 2.5000 mg | ORAL_TABLET | Freq: Every day | ORAL | 3 refills | Status: DC

## 2022-06-18 MED ORDER — SERTRALINE HCL 25 MG PO TABS: 25.0000 mg | ORAL_TABLET | Freq: Every day | ORAL | 3 refills | Status: DC

## 2022-06-18 NOTE — Assessment & Plan Note (Signed)
D level is 53 Vitamin D level is therapeutic with current supplementation Disc importance of this to bone and overall health

## 2022-06-18 NOTE — Patient Instructions (Addendum)
If you are interested in the shingles vaccine series (Shingrix), call your insurance or pharmacy to check on coverage and location it must be given.  If affordable - you can schedule it here or at your pharmacy depending on coverage   For diabetes prevention  Try to get most of your carbohydrates from produce (with the exception of white potatoes)  Eat less bread/pasta/rice/snack foods/cereals/sweets and other items from the middle of the grocery store (processed carbs)  I will put in a referral to the [facility]  Let us know if you don't get a call in 1-2 weeks You can look them up and call to see how long the wait list is    Think about strength training in addition to walking   Take care of yourself !  Flu shot today

## 2022-06-18 NOTE — Assessment & Plan Note (Signed)
Disc goals for lipids and reasons to control them Rev last labs with pt Rev low sat fat diet in detail  LDL is down to 118  Enc her to keep working on it

## 2022-06-18 NOTE — Progress Notes (Signed)
Subjective:    Patient ID: Makayla [name], female    DOB: 09/28/1960, 61 y.o.   MRN: [id number]  HPI Here for health maintenance exam and to review chronic medical problems    Wt Readings from Last 3 Encounters:  06/18/22 218 lb 9.6 oz (99.2 kg)  06/07/22 218 lb 6 oz (99.1 kg)  05/24/22 217 lb 2 oz (98.5 kg)   38.72 kg/m  Feeling good  Went on a cruise - was great but glad to be home Finally stopped swaying feeling off the boat    Immunization History  Administered Date(s) Administered   Influenza,inj,Quad PF,6+ Mos 05/18/2014, 05/29/2021   Influenza-Unspecified 05/19/2015, 05/05/2018, 05/25/2020   Moderna Sars-Covid-2 Vaccination 07/29/2019, 09/04/2019   Td 04/07/2007, 04/06/2017   Health Maintenance Due  Topic Date Due   HIV Screening  Never done   Hepatitis C Screening  Never done   Zoster Vaccines- Shingrix (1 of 2) Never done   COVID-19 Vaccine (3 - Moderna risk series) 10/02/2019   INFLUENZA VACCINE  03/06/2022   Shingrix- will check on coverage   Flu shot  today   Mammogram 07/2021- then bx which was b9 Personal h/o breast cancer  Due dec 4, has it scheduled  Self breast exam: no lumps    Colonoscopy was 10/2020 - had aspiration and did not finish procedure  Has it scheduled 11/29 with 2 day prep   GAD Zoloft 25 mg daily  Doing ok    HTN bp is stable today  No cp or palpitations or headaches or edema  No side effects to medicines  BP Readings from Last 3 Encounters:  06/18/22 130/82  06/07/22 (!) 142/86  05/24/22 128/72     Amlodipine 2.5 mg daily   Lab Results  Component Value Date   CREATININE 0.84 06/08/2022   BUN 8 06/08/2022   NA 139 06/08/2022   K 4.2 06/08/2022   CL 103 06/08/2022   CO2 30 06/08/2022   Lab Results  Component Value Date   ALT 18 06/08/2022   AST 14 06/08/2022   ALKPHOS 84 06/08/2022   BILITOT 0.4 06/08/2022   Lab Results  Component Value Date   WBC 8.9 06/08/2022   HGB 13.5 06/08/2022   HCT 41.4  06/08/2022   MCV 80.3 06/08/2022   PLT 154.0 06/08/2022   Lab Results  Component Value Date   TSH 2.22 06/08/2022     GERD Nexium otc prn   Elevated glucose Lab Results  Component Value Date   HGBA1C 6.3 06/08/2022  This is up from 5.9   Needs to limit portions  No sweets No sweet drinks  Mother has DM  Needs to loose weight  20-30 minutes per day   Hyperlipidemia Lab Results  Component Value Date   CHOL 203 (H) 06/08/2022   CHOL 215 (H) 05/29/2021   CHOL 211 (H) 05/02/2020   Lab Results  Component Value Date   HDL 53.90 06/08/2022   HDL 54.00 05/29/2021   HDL 54.80 05/02/2020   Lab Results  Component Value Date   LDLCALC 118 (H) 06/08/2022   LDLCALC 134 (H) 05/29/2021   LDLCALC 132 (H) 05/02/2020   Lab Results  Component Value Date   TRIG 156.0 (H) 06/08/2022   TRIG 138.0 05/29/2021   TRIG 123.0 05/02/2020   Lab Results  Component Value Date   CHOLHDL 4 06/08/2022   CHOLHDL 4 05/29/2021   CHOLHDL 4 05/02/2020   No results found for: "LDLDIRECT"  A little  improved LDL down to 118   Patient Active Problem List   Diagnosis Date Noted   Hemorrhoids 06/07/2022   Rib pain on left side 06/13/2020   Colon cancer screening 05/06/2020   Insomnia, psychophysiological 03/14/2020   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   Retro-orbital pain of left eye 03/14/2020   Pain head 02/25/2020   Pulsatile tinnitus of right ear 09/30/2019   Immunization reaction 09/08/2019   Grief reaction 10/03/2018   Elevated glucose level 01/16/2018   Generalized anxiety disorder 01/23/2017   Vitamin D deficiency 05/20/2015   Obesity 05/20/2015   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   Routine general medical examination at a health care facility 10/16/2011   Hyperlipidemia LDL goal <130 12/04/2007   GERD 12/04/2007   Essential hypertension 12/04/2007   Past Medical History:  Diagnosis Date   Allergy    pollen   Anxiety     Bursitis, trochanteric    bi/lat    Dental crowns present    x 4   Diverticulosis    GERD (gastroesophageal reflux disease)    Hyperlipidemia    no current med.   Hypertension    white coat syndrome,   Mass of breast, left 01/2012   Obesity    Past Surgical History:  Procedure Laterality Date   ABDOMINAL HYSTERECTOMY     partial   BREAST EXCISIONAL BIOPSY Left 2013   BREAST SURGERY  01/10/2012   lumpectomy - left (excisional biopsy benign)   CHOLECYSTECTOMY N/A 02/10/2016   Procedure: LAPAROSCOPIC CHOLECYSTECTOMY WITH INTRAOPERATIVE CHOLANGIOGRAM;  Surgeon: Autumn Messing III, MD;  Location: [facility];  Service: General;  Laterality: N/A;   COLONOSCOPY  04/06/2010   D Brodie, normal w/tics   RECTOCELE REPAIR  06/14/2011   cystocele repair, pt states Dr Vikki Ports did not do rectocele and said she had to come back later.   ROBOTIC ASSISTED LAPAROSCOPIC SACROCOLPOPEXY  06/14/2011   Procedure: ROBOTIC ASSISTED LAPAROSCOPIC SACROCOLPOPEXY;  Surgeon: Dutch Gray, MD;  Location: [facility];  Service: Urology;  Laterality: N/A;   TUBAL LIGATION     Social History   Tobacco Use   Smoking status: Never    Passive exposure: Past   Smokeless tobacco: Never  Vaping Use   Vaping Use: Never used  Substance Use Topics   Alcohol use: No    Alcohol/week: 0.0 standard drinks of alcohol   Drug use: No   Family History  Problem Relation Age of Onset   Hypertension Mother    Hyperlipidemia Mother    Diabetes Mother    Breast cancer Mother 35   Uterine cancer Mother    GER disease Father    Depression Sister    Heart disease Brother    Heart attack Maternal Grandmother    Hypertension Maternal Grandmother    Colon cancer Neg Hx    Colon polyps Neg Hx    Esophageal cancer Neg Hx    Rectal cancer Neg Hx    Stomach cancer Neg Hx    Allergies  Allergen Reactions   Buspar [Buspirone]     Made her more anxious   Current Outpatient Medications on File Prior to Visit  Medication Sig Dispense  Refill   b complex vitamins capsule Take 1 capsule by mouth daily.     esomeprazole (NEXIUM) 20 MG capsule Take 20 mg by mouth 2 (two) times daily before a meal.     fluticasone (FLONASE) 50 MCG/ACT nasal spray Place 2 sprays into both nostrils daily  as needed. 48 g 3   hydrOXYzine (ATARAX) 25 MG tablet Take 1 tablet (25 mg total) by mouth at bedtime as needed. 30 tablet 3   ibuprofen (ADVIL) 800 MG tablet TAKE 1 TABLET BY MOUTH EVERY 8 HOURS AS NEEDED FOR PAIN 90 tablet 1   ketoconazole (NIZORAL) 2 % cream Apply to affected fingernails 1-2 times daily as needed 15 g 0   Vitamin D, Cholecalciferol, 25 MCG (1000 UT) TABS Take 5,000 Units by mouth daily.     Vitamin E 200 units TABS Take by mouth.     hydrocortisone (ANUSOL-HC) 25 MG suppository Place 1 suppository (25 mg total) rectally 2 (two) times daily as needed for hemorrhoids or anal itching. (Patient not taking: Reported on 06/18/2022) 14 suppository 0   No current facility-administered medications on file prior to visit.     Review of Systems  Constitutional:  Negative for activity change, appetite change, fatigue, fever and unexpected weight change.  HENT:  Negative for congestion, ear pain, rhinorrhea, sinus pressure and sore throat.   Eyes:  Negative for pain, redness and visual disturbance.  Respiratory:  Negative for cough, shortness of breath and wheezing.   Cardiovascular:  Negative for chest pain and palpitations.  Gastrointestinal:  Negative for abdominal pain, blood in stool, constipation and diarrhea.  Endocrine: Negative for polydipsia and polyuria.  Genitourinary:  Negative for dysuria, frequency and urgency.  Musculoskeletal:  Negative for arthralgias, back pain and myalgias.  Skin:  Negative for pallor and rash.  Allergic/Immunologic: Negative for environmental allergies.  Neurological:  Negative for dizziness, syncope and headaches.  Hematological:  Negative for adenopathy. Does not bruise/bleed easily.   Psychiatric/Behavioral:  Negative for decreased concentration and dysphoric mood. The patient is not nervous/anxious.        Objective:   Physical Exam Constitutional:      General: She is not in acute distress.    Appearance: Normal appearance. She is well-developed. She is obese. She is not ill-appearing or diaphoretic.  HENT:     Head: Normocephalic and atraumatic.     Right Ear: Tympanic membrane, ear canal and external ear normal.     Left Ear: Tympanic membrane, ear canal and external ear normal.     Nose: Nose normal. No congestion.     Mouth/Throat:     Mouth: Mucous membranes are moist.     Pharynx: Oropharynx is clear. No posterior oropharyngeal erythema.  Eyes:     General: No scleral icterus.    Extraocular Movements: Extraocular movements intact.     Conjunctiva/sclera: Conjunctivae normal.     Pupils: Pupils are equal, round, and reactive to light.  Neck:     Thyroid: No thyromegaly.     Vascular: No carotid bruit or JVD.  Cardiovascular:     Rate and Rhythm: Normal rate and regular rhythm.     Pulses: Normal pulses.     Heart sounds: Normal heart sounds.     No gallop.  Pulmonary:     Effort: Pulmonary effort is normal. No respiratory distress.     Breath sounds: Normal breath sounds. No wheezing.     Comments: Good air exch Chest:     Chest wall: No tenderness.  Abdominal:     General: Bowel sounds are normal. There is no distension or abdominal bruit.     Palpations: Abdomen is soft. There is no mass.     Tenderness: There is no abdominal tenderness.     Hernia: No hernia is present.  Genitourinary:    Comments: Breast exam: No mass, nodules, thickening, tenderness, bulging, retraction, inflamation, nipple discharge or skin changes noted.  No axillary or clavicular LA.     Musculoskeletal:        General: No tenderness. Normal range of motion.     Cervical back: Normal range of motion and neck supple. No rigidity. No muscular tenderness.     Right  lower leg: No edema.     Left lower leg: No edema.     Comments: No kyphosis   Lymphadenopathy:     Cervical: No cervical adenopathy.  Skin:    General: Skin is warm and dry.     Coloration: Skin is not pale.     Findings: No erythema or rash.     Comments: Solar lentigines diffusely Some sks   Neurological:     Mental Status: She is alert. Mental status is at baseline.     Cranial Nerves: No cranial nerve deficit.     Motor: No abnormal muscle tone.     Coordination: Coordination normal.     Gait: Gait normal.     Deep Tendon Reflexes: Reflexes are normal and symmetric. Reflexes normal.  Psychiatric:        Mood and Affect: Mood normal.        Cognition and Memory: Cognition and memory normal.           Assessment & Plan:   Problem List Items Addressed This Visit       Cardiovascular and Mediastinum   Essential hypertension    bp in fair control at this time  BP Readings from Last 1 Encounters:  06/18/22 130/82  No changes needed Most recent labs reviewed  Disc lifstyle change with low sodium diet and exercise  Plan to continue amlodipine 2.5 mg daily       Relevant Medications   amLODipine (NORVASC) 2.5 MG tablet     Digestive   GERD    Prn nexium  Enc to watch diet         Other   Colon cancer screening    Repeat colonoscopy planned end of mo  2 d prep      Elevated glucose level    Lab Results  Component Value Date   HGBA1C 6.3 06/08/2022  This is up from 5.9  disc imp of low glycemic diet and wt loss to prevent DM2  Interested in the healthy wt clinic  May be candidate for GLP in future if insurance covers it for wt  Also metfomrin  Will continue to monitor        Relevant Orders   Amb Ref to Medical Weight Management   Generalized anxiety disorder    Reviewed stressors/ coping techniques/symptoms/ support sources/ tx options and side effects in detail today Doing well with zoloft 25 mg daily      Relevant Medications   sertraline  (ZOLOFT) 25 MG tablet   Hyperlipidemia LDL goal <130    Disc goals for lipids and reasons to control them Rev last labs with pt Rev low sat fat diet in detail  LDL is down to 118  Enc her to keep working on it       Relevant Medications   amLODipine (NORVASC) 2.5 MG tablet   Obesity    Discussed how this problem influences overall health and the risks it imposes  Reviewed plan for weight loss with lower calorie diet (via better food choices and also portion control or program  like weight watchers) and exercise building up to or more than 30 minutes 5 days per week including some aerobic activity  Per pt portion control is hard  Ref done to the healthy wt and wellness center Unsure how long of a wait list       Relevant Orders   Amb Ref to Medical Weight Management   Routine general medical examination at a health care facility - Primary    Reviewed health habits including diet and exercise and skin cancer prevention Reviewed appropriate screening tests for age  Also reviewed health mt list, fam hx and immunization status , as well as social and family history   See HPI Labs reviewed  Plans to check on shingrix ins cov Flu shot given today  Mammogram due in December (had b9 bx last dec in setting of past b cancer and fam hx) Colonoscopy planned for 07/04/22 with 2 d prep (this is 1 y recall did not finish and had asp event)  GAD is controlled with zoloft  Enc good self care Ref to healthy wt and wellness center       Vitamin D deficiency    D level is 63 Vitamin D level is therapeutic with current supplementation Disc importance of this to bone and overall health       Other Visit Diagnoses     Need for immunization against influenza       Relevant Orders   Flu Vaccine QUAD 61moIM (Fluarix, Fluzone & Alfiuria Quad PF) (Completed)

## 2022-06-18 NOTE — Assessment & Plan Note (Signed)
Reviewed stressors/ coping techniques/symptoms/ support sources/ tx options and side effects in detail today Doing well with zoloft 25 mg daily

## 2022-06-18 NOTE — Assessment & Plan Note (Signed)
bp in fair control at this time  BP Readings from Last 1 Encounters:  06/18/22 130/82   No changes needed Most recent labs reviewed  Disc lifstyle change with low sodium diet and exercise  Plan to continue amlodipine 2.5 mg daily

## 2022-06-18 NOTE — Assessment & Plan Note (Signed)
Lab Results  Component Value Date   HGBA1C 6.3 06/08/2022   This is up from 5.9  disc imp of low glycemic diet and wt loss to prevent DM2  Interested in the healthy wt clinic  May be candidate for GLP in future if insurance covers it for wt  Also metfomrin  Will continue to monitor

## 2022-06-18 NOTE — Assessment & Plan Note (Signed)
Reviewed health habits including diet and exercise and skin cancer prevention Reviewed appropriate screening tests for age  Also reviewed health mt list, fam hx and immunization status , as well as social and family history   See HPI Labs reviewed  Plans to check on shingrix ins cov Flu shot given today  Mammogram due in December (had b9 bx last dec in setting of past b cancer and fam hx) Colonoscopy planned for 07/04/22 with 2 d prep (this is 1 y recall did not finish and had asp event)  GAD is controlled with zoloft  Enc good self care [facility] to [facility]

## 2022-06-18 NOTE — Assessment & Plan Note (Signed)
Prn nexium  Enc to watch diet

## 2022-06-18 NOTE — Assessment & Plan Note (Signed)
Discussed how this problem influences overall health and the risks it imposes  Reviewed plan for weight loss with lower calorie diet (via better food choices and also portion control or program like weight watchers) and exercise building up to or more than 30 minutes 5 days per week including some aerobic activity  Per pt portion control is hard  Ref done to the [facility] Unsure how long of a wait list

## 2022-06-18 NOTE — Assessment & Plan Note (Signed)
Repeat colonoscopy planned end of mo  2 d prep

## 2022-06-18 NOTE — Progress Notes (Signed)
Urogynecology Return Visit  SUBJECTIVE  History of Present Illness: Makayla [name] is a 61 y.o. female seen in follow-up for prolapse.   Feels that the bulge has gotten worse over the last few weeks. Has some discomfort over the perineal area.  Denies any bladder or bowel issues, no constipation. Has some urgency but not really having any urinary leakage.   No changes to her medical history. S/p SPARC retropubic sling in 2012   Past Medical History: Patient  has a past medical history of Allergy, Anxiety, Bursitis, trochanteric, Dental crowns present, Diverticulosis, GERD (gastroesophageal reflux disease), Hyperlipidemia, Hypertension, Mass of breast, left (01/2012), and Obesity.   Past Surgical History: She  has a past surgical history that includes Tubal ligation; Robotic assisted laparoscopic sacrocolpopexy (06/14/2011); Rectocele repair (06/14/2011); Abdominal hysterectomy; Breast surgery (01/10/2012); Cholecystectomy (N/A, 02/10/2016); Breast excisional biopsy (Left, 2013); and Colonoscopy (04/06/2010).   Medications: She has a current medication list which includes the following prescription(s): amlodipine, b complex vitamins, esomeprazole, fluticasone, hydrocortisone, hydroxyzine, ibuprofen, ketoconazole, sertraline, vitamin d (cholecalciferol), and vitamin e.   Allergies: Patient is allergic to buspar [buspirone].   Social History: Patient  reports that she has never smoked. She has been exposed to tobacco smoke. She has never used smokeless tobacco. She reports that she does not drink alcohol and does not use drugs.      OBJECTIVE     Physical Exam: Vitals:   06/18/22 1302  BP: 122/80  Pulse: 85  Weight: 220 lb (99.8 kg)  Height: 5' 3.75" (1.619 m)   Gen: No apparent distress, A&O x 3.  Detailed Urogynecologic Evaluation:  Speculum exam shows normal vaginal mucosa.   POP-Q  -3                                            Aa   -3                                            Ba  -7.5                                              C   7                                            Gh  4.5                                            Pb  9                                            tvl   2  Ap  2                                            Bp                                                 D      ASSESSMENT AND PLAN    [name] is a 61 y.o. with:  1. Prolapse of posterior vaginal wall   2. Vaginal vault prolapse after hysterectomy      Plan for surgery: Exam under anesthesia, posterior repair, perineorrhaphy, sacrospinous ligament fixation.   - We reviewed the patient's specific anatomic and functional findings, with the assistance of diagrams, and together finalized the above procedure. The planned surgical procedures were discussed along with the surgical risks outlined below, which were also provided on a detailed handout. Additional treatment options including expectant management, conservative management, medical management were discussed where appropriate.  We reviewed the benefits and risks of each treatment option.   - For preop Visit:  She is required to have a visit within 30 days of her surgery.   - Medical clearance: not required  - Anticoagulant use: No - Medicaid Hysterectomy form: No - Accepts blood transfusion: Yes - Expected length of stay: outpatient  Request sent for surgery scheduling.   Jaquita Folds, MD

## 2022-06-19 ENCOUNTER — Encounter (INDEPENDENT_AMBULATORY_CARE_PROVIDER_SITE_OTHER): Payer: Self-pay

## 2022-06-21 ENCOUNTER — Telehealth: Payer: Self-pay | Admitting: Nurse Practitioner

## 2022-06-21 NOTE — Telephone Encounter (Signed)
I called patient to discuss bowel prep. Her bowel prep on her last colonoscopy was great. I recently saw her in clinic, meant to give her Golytely but ordered suprep instead. She has the Suprep.   To help ensure a good prep will add another step to prepping process:  On the day that she is to take the suprep she will mix a small container of miralax with a liter of water and drink it that morning.

## 2022-06-25 ENCOUNTER — Encounter: Payer: Self-pay | Admitting: Gastroenterology

## 2022-07-01 ENCOUNTER — Encounter: Payer: Self-pay | Admitting: Certified Registered Nurse Anesthetist

## 2022-07-03 ENCOUNTER — Ambulatory Visit: Payer: Managed Care, Other (non HMO) | Admitting: Dermatology

## 2022-07-04 ENCOUNTER — Encounter: Payer: Self-pay | Admitting: Gastroenterology

## 2022-07-04 ENCOUNTER — Ambulatory Visit: Payer: Managed Care, Other (non HMO) | Admitting: Gastroenterology

## 2022-07-04 VITALS — BP 130/75 | HR 98 | Temp 97.8°F | Resp 17 | Ht 63.0 in | Wt 217.0 lb

## 2022-07-04 DIAGNOSIS — Z1211 Encounter for screening for malignant neoplasm of colon: Secondary | ICD-10-CM | POA: Diagnosis present

## 2022-07-04 DIAGNOSIS — R1012 Left upper quadrant pain: Secondary | ICD-10-CM

## 2022-07-04 DIAGNOSIS — Z1212 Encounter for screening for malignant neoplasm of rectum: Secondary | ICD-10-CM

## 2022-07-04 DIAGNOSIS — K2970 Gastritis, unspecified, without bleeding: Secondary | ICD-10-CM

## 2022-07-04 DIAGNOSIS — K2270 Barrett's esophagus without dysplasia: Secondary | ICD-10-CM

## 2022-07-04 MED ORDER — SODIUM CHLORIDE 0.9 % IV SOLN: 500.0000 mL | Freq: Once | INTRAVENOUS | Status: DC

## 2022-07-04 NOTE — Progress Notes (Signed)
Report given to PACU, vss.  Dr Loletha Carrow updated about HR, request no intervention to be done.

## 2022-07-04 NOTE — Progress Notes (Signed)
History and Physical:  This patient presents for endoscopic testing for: Encounter Diagnoses  Name Primary?   Screening for colorectal cancer Yes   LUQ pain     61 year old woman here for endoscopic evaluation of symptoms outlined in 05/24/2022 office note. Intermittent left upper quadrant pain, sometimes worse with heavier foods but also with a musculoskeletal component. Screening colonoscopy March 2022 with fair preparation, exam early with respiratory distress from aspiration due to a redundant colon and difficult scope passage.  Patient is otherwise without complaints or active issues today.   Past Medical History: Past Medical History:  Diagnosis Date   Allergy    pollen   Anxiety    Bursitis, trochanteric    bi/lat    Dental crowns present    x 4   Diverticulosis    GERD (gastroesophageal reflux disease)    Hyperlipidemia    no current med.   Hypertension    white coat syndrome,   Mass of breast, left 01/2012   Obesity      Past Surgical History: Past Surgical History:  Procedure Laterality Date   ABDOMINAL HYSTERECTOMY     partial   BREAST EXCISIONAL BIOPSY Left 2013   BREAST SURGERY  01/10/2012   lumpectomy - left (excisional biopsy benign)   CHOLECYSTECTOMY N/A 02/10/2016   Procedure: LAPAROSCOPIC CHOLECYSTECTOMY WITH INTRAOPERATIVE CHOLANGIOGRAM;  Surgeon: Autumn Messing III, MD;  Location: [facility];  Service: General;  Laterality: N/A;   COLONOSCOPY  04/06/2010   D [facility], normal w/tics   RECTOCELE REPAIR  06/14/2011   cystocele repair, pt states Dr Vikki Ports did not do rectocele and said she had to come back later.   ROBOTIC ASSISTED LAPAROSCOPIC SACROCOLPOPEXY  06/14/2011   Procedure: ROBOTIC ASSISTED LAPAROSCOPIC SACROCOLPOPEXY;  Surgeon: Dutch Gray, MD;  Location: [facility];  Service: Urology;  Laterality: N/A;   TUBAL LIGATION      Allergies: Allergies  Allergen Reactions   Buspar [Buspirone]     Made her more anxious    Outpatient Meds: Current  Outpatient Medications  Medication Sig Dispense Refill   amLODipine (NORVASC) 2.5 MG tablet Take 1 tablet (2.5 mg total) by mouth daily. 90 tablet 3   b complex vitamins capsule Take 1 capsule by mouth daily.     esomeprazole (NEXIUM) 20 MG capsule Take 20 mg by mouth 2 (two) times daily before a meal.     sertraline (ZOLOFT) 25 MG tablet Take 1 tablet (25 mg total) by mouth daily. 90 tablet 3   Vitamin D, Cholecalciferol, 25 MCG (1000 UT) TABS Take 5,000 Units by mouth daily.     Vitamin E 200 units TABS Take by mouth.     fluticasone (FLONASE) 50 MCG/ACT nasal spray Place 2 sprays into both nostrils daily as needed. 48 g 3   hydrocortisone (ANUSOL-HC) 25 MG suppository Place 1 suppository (25 mg total) rectally 2 (two) times daily as needed for hemorrhoids or anal itching. 14 suppository 0   hydrOXYzine (ATARAX) 25 MG tablet Take 1 tablet (25 mg total) by mouth at bedtime as needed. 30 tablet 3   ibuprofen (ADVIL) 800 MG tablet TAKE 1 TABLET BY MOUTH EVERY 8 HOURS AS NEEDED FOR PAIN 90 tablet 1   ketoconazole (NIZORAL) 2 % cream Apply to affected fingernails 1-2 times daily as needed 15 g 0   Current Facility-Administered Medications  Medication Dose Route Frequency Provider Last Rate Last Admin   0.9 %  sodium chloride infusion  500 mL Intravenous Once Nelida Meuse III,  MD          ___________________________________________________________________ Objective   Exam:  BP 134/79   Pulse 96   Temp 97.8 F (36.6 C)   Ht '5\' 3"'$  (1.6 m)   Wt 217 lb (98.4 kg)   SpO2 98%   BMI 38.44 kg/m   CV: regular , S1/S2 Resp: clear to auscultation bilaterally, normal RR and effort noted GI: soft, no tenderness, with active bowel sounds.   Assessment: Encounter Diagnoses  Name Primary?   Screening for colorectal cancer Yes   LUQ pain      Plan: EGD Colonoscopy  The benefits and risks of the planned procedure were described in detail with the patient or (when appropriate) their  health care proxy.  Risks were outlined as including, but not limited to, bleeding, infection, perforation, adverse medication reaction leading to cardiac or pulmonary decompensation, pancreatitis (if ERCP).  The limitation of incomplete mucosal visualization was also discussed.  No guarantees or warranties were given.    The patient is appropriate for an endoscopic procedure in the ambulatory setting.   - Wilfrid Lund, MD

## 2022-07-04 NOTE — Progress Notes (Signed)
Pt's states no medical or surgical changes since previsit or office visit. 

## 2022-07-04 NOTE — Progress Notes (Signed)
0800 Robinul 0.1 mg IV given due large amount of secretions upon assessment.  MD made aware, vss

## 2022-07-04 NOTE — Progress Notes (Signed)
Called to room to assist during endoscopic procedure.  Patient ID and intended procedure confirmed with present staff. Received instructions for my participation in the procedure from the performing physician.  

## 2022-07-04 NOTE — Op Note (Signed)
[facility] [facility] Patient Name: Rosio [name] Procedure Date: 07/04/2022 7:31 AM MRN: [id number] Endoscopist: Mallie Mussel L. Loletha Carrow , MD, [id number] Age: 61 Referring MD:  Date of Birth: [birth year] Gender: Female Account #: [id number] Procedure:                Upper GI endoscopy Indications:              Abdominal pain in the left upper quadrant Medicines:                Monitored Anesthesia Care Procedure:                Pre-Anesthesia Assessment:                           - Prior to the procedure, a History and Physical                            was performed, and patient medications and                            allergies were reviewed. The patient's tolerance of                            previous anesthesia was also reviewed. The risks                            and benefits of the procedure and the sedation                            options and risks were discussed with the patient.                            All questions were answered, and informed consent                            was obtained. Prior Anticoagulants: The patient has                            taken no anticoagulant or antiplatelet agents. ASA                            Grade Assessment: II - A patient with mild systemic                            disease. After reviewing the risks and benefits,                            the patient was deemed in satisfactory condition to                            undergo the procedure.                           After obtaining informed consent, the endoscope was  passed under direct vision. Throughout the                            procedure, the patient's blood pressure, pulse, and                            oxygen saturations were monitored continuously. The                            GIF [id number] was introduced through the                            mouth, and advanced to the second part of duodenum.                            The upper GI  endoscopy was accomplished without                            difficulty. The patient tolerated the procedure                            well. Scope In: Scope Out: Findings:                 A 2 cm sliding hiatal hernia was present.                           There is no endoscopic evidence of esophagitis or                            stricture in the entire esophagus.                           Variable Z-line. Salmon-colored mucosa was present.                            It was a 53m length mucosal finding above the EGJ                            and separated from it by normal squamous mucosa. No                            other visible abnormalities were present with                            [facility],NBI and magnification. There was an additional                            diminutive island of similar tissue. Biopsies were                            taken with a cold forceps for histology (r/o  Barrett's).                           Multiple sessile fundic gland polyps were found in                            the gastric fundus and in the gastric body.                           Localized inflammation characterized by congestion                            (edema), erythema and granularity was found on the                            posterior wall of the gastric body. Multiple                            biopsies were obtained on the posterior wall of the                            stomach, on the greater curvature of the gastric                            antrum and on the lesser curvature of the gastric                            antrum with cold forceps for histology. (separate                            jars from the different locations)                           The exam of the stomach was otherwise normal,                            including on retroflexion.                           The examined duodenum was normal. Complications:            No immediate  complications. Estimated Blood Loss:     Estimated blood loss was minimal. Impression:               - 2 cm hiatal hernia.                           - Salmon-colored mucosa. Biopsied.                           - Multiple fundic gland polyps.                           - Gastritis.                           -  Normal examined duodenum.                           - Multiple biopsies were obtained on the posterior                            wall of the stomach, on the greater curvature of                            the gastric antrum and on the lesser curvature of                            the gastric antrum. Recommendation:           - Patient has a contact number available for                            emergencies. The signs and symptoms of potential                            delayed complications were discussed with the                            patient. Return to normal activities tomorrow.                            Written discharge instructions were provided to the                            patient.                           - Resume previous diet.                           - Continue present medications.                           - Await pathology results.                           - See the other procedure note for documentation of                            additional recommendations. Demeco Ducksworth L. Loletha Carrow, MD 07/04/2022 8:44:55 AM This report has been signed electronically.

## 2022-07-04 NOTE — Patient Instructions (Signed)
Please read handouts provided. Continue present medications. Await pathology results. Repeat colonoscopy in 10 years for screening. Resume previous diet.   YOU HAD AN ENDOSCOPIC PROCEDURE TODAY AT [facility]:   Refer to the procedure report that was given to you for any specific questions about what was found during the examination.  If the procedure report does not answer your questions, please call your gastroenterologist to clarify.  If you requested that your care partner not be given the details of your procedure findings, then the procedure report has been included in a sealed envelope for you to review at your convenience later.  YOU SHOULD EXPECT: Some feelings of bloating in the abdomen. Passage of more gas than usual.  Walking can help get rid of the air that was put into your GI tract during the procedure and reduce the bloating. If you had a lower endoscopy (such as a colonoscopy or flexible sigmoidoscopy) you may notice spotting of blood in your stool or on the toilet paper. If you underwent a bowel prep for your procedure, you may not have a normal bowel movement for a few days.  Please Note:  You might notice some irritation and congestion in your nose or some drainage.  This is from the oxygen used during your procedure.  There is no need for concern and it should clear up in a day or so.  SYMPTOMS TO REPORT IMMEDIATELY:  Following lower endoscopy (colonoscopy or flexible sigmoidoscopy):  Excessive amounts of blood in the stool  Significant tenderness or worsening of abdominal pains  Swelling of the abdomen that is new, acute  Fever of 100F or higher  Following upper endoscopy (EGD)  Vomiting of blood or coffee ground material  New chest pain or pain under the shoulder blades  Painful or persistently difficult swallowing  New shortness of breath  Fever of 100F or higher  Black, tarry-looking stools  For urgent or emergent issues, a gastroenterologist  can be reached at any hour by calling [phone]. Do not use MyChart messaging for urgent concerns.    DIET:  We do recommend a small meal at first, but then you may proceed to your regular diet.  Drink plenty of fluids but you should avoid alcoholic beverages for 24 hours.  ACTIVITY:  You should plan to take it easy for the rest of today and you should NOT DRIVE or use heavy machinery until tomorrow (because of the sedation medicines used during the test).    FOLLOW UP: Our staff will call the number listed on your records the next business day following your procedure.  We will call around 7:15- 8:00 am to check on you and address any questions or concerns that you may have regarding the information given to you following your procedure. If we do not reach you, we will leave a message.     If any biopsies were taken you will be contacted by phone or by letter within the next 1-3 weeks.  Please call us at [phone] if you have not heard about the biopsies in 3 weeks.    SIGNATURES/CONFIDENTIALITY: You and/or your care partner have signed paperwork which will be entered into your electronic medical record.  These signatures attest to the fact that that the information above on your After Visit Summary has been reviewed and is understood.  Full responsibility of the confidentiality of this discharge information lies with you and/or your care-partner.

## 2022-07-04 NOTE — Op Note (Signed)
[facility] Patient Name: Makayla [name] Procedure Date: 07/04/2022 7:37 AM MRN: [id number] Endoscopist: Mallie Mussel L. Loletha [name] , MD, [id number] Age: 61 Referring MD:  Date of Birth: 1961-07-28 Gender: Female Account #: [id number] Procedure:                Colonoscopy Indications:              Screening for colorectal malignant neoplasm                           (fair prep on 2022 screening colonoscopy. That exam                            was also terminated due to aspiration) Medicines:                Monitored Anesthesia Care Procedure:                Pre-Anesthesia Assessment:                           - Prior to the procedure, a History and Physical                            was performed, and patient medications and                            allergies were reviewed. The patient's tolerance of                            previous anesthesia was also reviewed. The risks                            and benefits of the procedure and the sedation                            options and risks were discussed with the patient.                            All questions were answered, and informed consent                            was obtained. Prior Anticoagulants: The patient has                            taken no anticoagulant or antiplatelet agents. ASA                            Grade Assessment: II - A patient with mild systemic                            disease. After reviewing the risks and benefits,                            the patient was deemed in satisfactory condition to  undergo the procedure.                           After obtaining informed consent, the colonoscope                            was passed under direct vision. Throughout the                            procedure, the patient's blood pressure, pulse, and                            oxygen saturations were monitored continuously. The                            Olympus CF-[id number]  [id number]) Colonoscope was                            introduced through the anus and advanced to the the                            cecum, identified by appendiceal orifice and                            ileocecal valve. The colonoscopy was somewhat                            difficult due to multiple diverticula in the colon                            and a redundant colon. Successful completion of the                            procedure was aided by using manual pressure and                            straightening and shortening the scope to obtain                            bowel loop reduction. The patient tolerated the                            procedure well. The quality of the bowel                            preparation was good. The ileocecal valve,                            appendiceal orifice, and rectum were photographed.                            The bowel preparation used was 2 day Suprep/Miralax. Scope In: 8:20:25 AM Scope Out: 8:34:20 AM Scope Withdrawal Time: 0 hours 8 minutes 35 seconds  Total Procedure Duration:  0 hours 13 minutes 55 seconds  Findings:                 Internal hemorrhoids were found on perianal exam.                            Decreased resting sphincter tone.                           Many diverticula were found in the left colon and                            right colon.                           There is no endoscopic evidence of polyps in the                            entire colon.                           Internal hemorrhoids were found.                           The exam was otherwise without abnormality on                            direct and retroflexion views. Complications:            No immediate complications. Estimated Blood Loss:     Estimated blood loss: none. Impression:               - Hemorrhoids found on perianal exam.                           - Diverticulosis in the left colon and in the right                             colon.                           - Internal hemorrhoids.                           - The examination was otherwise normal on direct                            and retroflexion views.                           - No specimens collected. Recommendation:           - Patient has a contact number available for                            emergencies. The signs and symptoms of potential                            delayed  complications were discussed with the                            patient. Return to normal activities tomorrow.                            Written discharge instructions were provided to the                            patient.                           - Resume previous diet.                           - Continue present medications.                           - Repeat colonoscopy in 10 years for screening                            purposes.                           - See the other procedure note for documentation of                            additional recommendations. Makayla Andis L. Loletha Carrow, MD 07/04/2022 8:48:55 AM This report has been signed electronically.

## 2022-07-05 ENCOUNTER — Telehealth: Payer: Self-pay | Admitting: *Deleted

## 2022-07-05 NOTE — Telephone Encounter (Signed)
Post procedure follow up call placed, no answer and left VM.  

## 2022-07-11 ENCOUNTER — Encounter: Payer: Self-pay | Admitting: Gastroenterology

## 2022-07-11 ENCOUNTER — Ambulatory Visit
Admission: RE | Admit: 2022-07-11 | Discharge: 2022-07-11 | Disposition: A | Discharge status: Home / Self Care | Payer: Managed Care, Other (non HMO) | Source: Ambulatory Visit

## 2022-07-11 DIAGNOSIS — Z1231 Encounter for screening mammogram for malignant neoplasm of breast: Secondary | ICD-10-CM

## 2022-07-18 ENCOUNTER — Encounter (INDEPENDENT_AMBULATORY_CARE_PROVIDER_SITE_OTHER): Payer: Self-pay | Admitting: Family Medicine

## 2022-07-18 ENCOUNTER — Ambulatory Visit (INDEPENDENT_AMBULATORY_CARE_PROVIDER_SITE_OTHER): Payer: Managed Care, Other (non HMO) | Admitting: Family Medicine

## 2022-07-18 VITALS — BP 129/85 | HR 77 | Temp 97.5°F | Ht 64.0 in | Wt 214.0 lb

## 2022-07-18 DIAGNOSIS — R7309 Other abnormal glucose: Secondary | ICD-10-CM | POA: Diagnosis not present

## 2022-07-18 DIAGNOSIS — I10 Essential (primary) hypertension: Secondary | ICD-10-CM

## 2022-07-18 DIAGNOSIS — Z0289 Encounter for other administrative examinations: Secondary | ICD-10-CM

## 2022-07-18 DIAGNOSIS — E669 Obesity, unspecified: Secondary | ICD-10-CM

## 2022-07-18 DIAGNOSIS — Z6836 Body mass index (BMI) 36.0-36.9, adult: Secondary | ICD-10-CM | POA: Diagnosis not present

## 2022-07-19 ENCOUNTER — Ambulatory Visit: Payer: Managed Care, Other (non HMO) | Admitting: Dermatology

## 2022-07-23 ENCOUNTER — Ambulatory Visit: Payer: Managed Care, Other (non HMO) | Admitting: Dermatology

## 2022-07-26 NOTE — Progress Notes (Signed)
Office: [phone]  /  Fax: [phone]   Initial Visit  Makayla [name] was seen in clinic today to evaluate for obesity. She is interested in losing weight to improve overall health and reduce the risk of weight related complications. She presents today to review program treatment options, initial physical assessment, and evaluation.     She was referred by: PCP  When asked what else they would like to accomplish? She states: Adopt healthier eating patterns and Reduce number of medications  When asked how has your weight affected you? She states: Contributed to medical problems, Contributed to orthopedic problems or mobility issues, and Other: she works part time in Building [employer].   Some associated conditions: Hypertension and Prediabetes  Contributing factors: Family history, Stress, Life event, Menopause, and Other: she lost her husband due to [place].   Weight promoting medications identified: Other: n/a  Current nutrition plan: None and Other: no fish.   Current level of physical activity: None and Other: She used to walk.  She has a torn meniscus and DOE s/p COVID-19 (negative cardiac workup).  Current or previous pharmacotherapy: None  Response to medication: Never tried medications  Past medical history includes:   Past Medical History:  Diagnosis Date   Allergy    pollen   Anxiety    Bursitis, trochanteric    bi/lat    Dental crowns present    x 4   Diverticulosis    GERD (gastroesophageal reflux disease)    Hyperlipidemia    no current med.   Hypertension    white coat syndrome,   Mass of breast, left 01/2012   Obesity    Objective:   BP 129/85   Pulse 77   Temp (!) 97.5 F (36.4 C)   Ht '5\' 4"'$  (1.626 m)   Wt 214 lb (97.1 kg)   SpO2 94%   BMI 36.73 kg/m  She was weighed on the bioimpedance scale: Body mass index is 36.73 kg/m. Visceral Fat Rating:14, Body Fat%:46.8  General:  Alert, oriented and cooperative. Patient is in no acute distress.   Respiratory: Normal respiratory effort, no problems with respiration noted  Extremities: Normal range of motion.    Mental Status: Normal mood and affect. Normal behavior. Normal judgment and thought content.   Assessment and Plan:  1. Essential hypertension Blood pressure is well-controlled on amlodipine 2.5 mg daily.  Patient denies chest pain.  Cardiac stress test recently done for DOE, negative for ischemic changes.  ETT, 03/12/2022.  [facility]ology notes reviewed from 04/02/2022.  Look for improvement in blood pressure with healthy diet and weight loss.  2. Elevated glucose 06/08/2022, glucose 120 on fasting labs, previously 127.  06/08/2022 A1c was 6.3 and prediabetic range.  Look for improvement with dietary changes and weight loss.  3. Obesity,current BMI 36.9 1) Reviewed information about our program. 2) Reviewed today's Bioimpedance results.   We reviewed weight, biometrics, associated medical conditions and contributing factors with patient. She would benefit from weight loss therapy via a modified calorie, low-carb, high-protein nutritional plan tailored to their REE (resting energy expenditure) which will be determined by indirect calorimetry.  We will also assess for cardiometabolic risk and nutritional derangements via fasting serologies at her next appointment.     Obesity Treatment / Action Plan:  Patient will work on garnering support from family and friends to begin weight loss journey. Will work on eliminating or reducing the presence of highly palatable, calorie dense foods in the home. Will complete provided nutritional and  psychosocial assessment questionnaire before the next appointment. Will be scheduled for indirect calorimetry to determine resting energy expenditure in a fasting state.  This will allow [place] to create a reduced calorie, high-protein meal plan to promote loss of fat mass while preserving muscle mass. Was counseled on nutritional approaches to weight  loss and benefits of complex carbs and high quality protein as part of nutritional weight management. Was counseled on pharmacotherapy and role as an adjunct in weight management.   Obesity Education Performed Today:  She was weighed on the bioimpedance scale and results were discussed and documented in the synopsis.  We discussed obesity as a disease and the importance of a more detailed evaluation of all the factors contributing to the disease.  We discussed the importance of long term lifestyle changes which include nutrition, exercise and behavioral modifications as well as the importance of customizing this to her specific health and social needs.  We discussed the benefits of reaching a healthier weight to alleviate the symptoms of existing conditions and reduce the risks of the biomechanical, metabolic and psychological effects of obesity.  Makayla [name] appears to be in the action stage of change and states they are ready to start intensive lifestyle modifications and behavioral modifications.  30 minutes was spent today on this visit including the above counseling, pre-visit chart review, and post-visit documentation.  Reviewed by clinician on day of visit: allergies, medications, problem list, medical history, surgical history, family history, social history, and previous encounter notes.  I, Davy Pique, am acting as Location manager for Loyal Gambler, DO. I have reviewed the above documentation for accuracy and completeness, and I agree with the above. Dell Ponto, DO

## 2022-08-08 ENCOUNTER — Encounter: Payer: Self-pay | Admitting: Obstetrics and Gynecology

## 2022-08-08 ENCOUNTER — Ambulatory Visit (INDEPENDENT_AMBULATORY_CARE_PROVIDER_SITE_OTHER): Payer: Managed Care, Other (non HMO) | Admitting: Obstetrics and Gynecology

## 2022-08-08 VITALS — BP 117/82 | HR 86 | Wt 220.0 lb

## 2022-08-08 DIAGNOSIS — N993 Prolapse of vaginal vault after hysterectomy: Secondary | ICD-10-CM

## 2022-08-08 DIAGNOSIS — N816 Rectocele: Secondary | ICD-10-CM

## 2022-08-08 DIAGNOSIS — Z01818 Encounter for other preprocedural examination: Secondary | ICD-10-CM

## 2022-08-08 MED ORDER — POLYETHYLENE GLYCOL 3350 17 GM/SCOOP PO POWD: 17.0000 g | Freq: Every day | ORAL | 0 refills | Status: DC

## 2022-08-08 MED ORDER — OXYCODONE HCL 5 MG PO TABS: 5.0000 mg | ORAL_TABLET | ORAL | 0 refills | Status: DC | PRN

## 2022-08-08 MED ORDER — ACETAMINOPHEN 500 MG PO TABS: 500.0000 mg | ORAL_TABLET | Freq: Four times a day (QID) | ORAL | 0 refills | Status: DC | PRN

## 2022-08-08 MED ORDER — IBUPROFEN 600 MG PO TABS: 600.0000 mg | ORAL_TABLET | Freq: Four times a day (QID) | ORAL | 0 refills | Status: DC | PRN

## 2022-08-08 NOTE — Progress Notes (Signed)
[facility] Pre-Operative visit  Subjective Chief Complaint: Makayla [name] presents for a preoperative encounter.   History of Present Illness: Makayla [name] is a 62 y.o. female who presents for preoperative visit.  She is scheduled to undergo Exam under anesthesia, posterior repair, perineorrhaphy, sacrospinous ligament fixation on 08/20/22.  Her symptoms include vaginal bulge, and she was was found to have Stage 0 anterior, Stage III posterior, Stage I apical prolapse.   S/p SPARC retropubic sling in 2012.  Past Medical History:  Diagnosis Date   Allergy    pollen   Anxiety    Bursitis, trochanteric    bi/lat    Dental crowns present    x 4   Diverticulosis    GERD (gastroesophageal reflux disease)    Hyperlipidemia    no current med.   Hypertension    white coat syndrome,   Mass of breast, left 01/2012   Obesity      Past Surgical History:  Procedure Laterality Date   ABDOMINAL HYSTERECTOMY     partial   BREAST BIOPSY Right 08/04/2021   BREAST EXCISIONAL BIOPSY Left 2013   BREAST SURGERY  01/10/2012   lumpectomy - left (excisional biopsy benign)   CHOLECYSTECTOMY N/A 02/10/2016   Procedure: LAPAROSCOPIC CHOLECYSTECTOMY WITH INTRAOPERATIVE CHOLANGIOGRAM;  Surgeon: Autumn Messing III, MD;  Location: [facility];  Service: General;  Laterality: N/A;   COLONOSCOPY  04/06/2010   D Brodie, normal w/tics   RECTOCELE REPAIR  06/14/2011   cystocele repair, pt states Dr Vikki Ports did not do rectocele and said she had to come back later.   ROBOTIC ASSISTED LAPAROSCOPIC SACROCOLPOPEXY  06/14/2011   Procedure: ROBOTIC ASSISTED LAPAROSCOPIC SACROCOLPOPEXY;  Surgeon: Dutch Gray, MD;  Location: [facility];  Service: Urology;  Laterality: N/A;   TUBAL LIGATION      is allergic to buspar [buspirone].   Family History  Problem Relation Age of Onset   Hypertension Mother    Hyperlipidemia Mother    Diabetes Mother    Breast cancer Mother 2   Uterine cancer Mother    GER disease  Father    Depression Sister    Heart disease Brother    Heart attack Maternal Grandmother    Hypertension Maternal Grandmother    Colon cancer Neg Hx    Colon polyps Neg Hx    Esophageal cancer Neg Hx    Rectal cancer Neg Hx    Stomach cancer Neg Hx     Social History   Tobacco Use   Smoking status: Never    Passive exposure: Past   Smokeless tobacco: Never  Vaping Use   Vaping Use: Never used  Substance Use Topics   Alcohol use: No    Alcohol/week: 0.0 standard drinks of alcohol   Drug use: No     Review of Systems was negative for a full 10 system review except as noted in the History of Present Illness.   Current Outpatient Medications:    acetaminophen (TYLENOL) 500 MG tablet, Take 1 tablet (500 mg total) by mouth every 6 (six) hours as needed (pain)., Disp: 30 tablet, Rfl: 0   amLODipine (NORVASC) 2.5 MG tablet, Take 1 tablet (2.5 mg total) by mouth daily., Disp: 90 tablet, Rfl: 3   b complex vitamins capsule, Take 1 capsule by mouth daily., Disp: , Rfl:    esomeprazole (NEXIUM) 20 MG capsule, Take 20 mg by mouth 2 (two) times daily before a meal., Disp: , Rfl:    fluticasone (FLONASE) 50  MCG/ACT nasal spray, Place 2 sprays into both nostrils daily as needed., Disp: 48 g, Rfl: 3   hydrocortisone (ANUSOL-HC) 25 MG suppository, Place 1 suppository (25 mg total) rectally 2 (two) times daily as needed for hemorrhoids or anal itching., Disp: 14 suppository, Rfl: 0   hydrOXYzine (ATARAX) 25 MG tablet, Take 1 tablet (25 mg total) by mouth at bedtime as needed., Disp: 30 tablet, Rfl: 3   ibuprofen (ADVIL) 600 MG tablet, Take 1 tablet (600 mg total) by mouth every 6 (six) hours as needed., Disp: 30 tablet, Rfl: 0   ibuprofen (ADVIL) 800 MG tablet, TAKE 1 TABLET BY MOUTH EVERY 8 HOURS AS NEEDED FOR PAIN, Disp: 90 tablet, Rfl: 1   ketoconazole (NIZORAL) 2 % cream, Apply to affected fingernails 1-2 times daily as needed, Disp: 15 g, Rfl: 0   oxyCODONE (OXY IR/ROXICODONE) 5 MG  immediate release tablet, Take 1 tablet (5 mg total) by mouth every 4 (four) hours as needed for severe pain., Disp: 5 tablet, Rfl: 0   polyethylene glycol powder (GLYCOLAX/MIRALAX) 17 GM/SCOOP powder, Take 17 g by mouth daily. Drink 17g (1 scoop) dissolved in water per day., Disp: 255 g, Rfl: 0   sertraline (ZOLOFT) 25 MG tablet, Take 1 tablet (25 mg total) by mouth daily., Disp: 90 tablet, Rfl: 3   Vitamin D, Cholecalciferol, 25 MCG (1000 UT) TABS, Take 5,000 Units by mouth daily., Disp: , Rfl:    Vitamin E 200 units TABS, Take by mouth., Disp: , Rfl:    Objective Vitals:   08/08/22 0807  BP: 117/82  Pulse: 86    Gen: NAD CV: S1 S2 RRR Lungs: Clear to auscultation bilaterally Abd: soft, nontender   Previous Pelvic Exam showed: POP-Q   -3                                            Aa   -3                                           Ba   -7.5                                              C    7                                            Gh   4.5                                            Pb   9                                            tvl    2  Ap   2                                            Bp                                                  D         Assessment/ Plan  Assessment: The patient is a 62 y.o. year old scheduled to undergo Exam under anesthesia, posterior repair, perineorrhaphy, sacrospinous ligament fixation . Verbal consent was obtained for these procedures.  Plan: General Surgical Consent: The patient has previously been counseled on alternative treatments, and the decision by the patient and provider was to proceed with the procedure listed above.  For all procedures, there are risks of bleeding, infection, damage to surrounding organs including but not limited to bowel, bladder, blood vessels, ureters and nerves, and need for further surgery if an injury were to occur. These risks are all low with  minimally invasive surgery.   There are risks of numbness and weakness at any body site or buttock/rectal pain.  It is possible that baseline pain can be worsened by surgery, either with or without mesh. If surgery is vaginal, there is also a low risk of possible conversion to laparoscopy or open abdominal incision where indicated. Very rare risks include blood transfusion, blood clot, heart attack, pneumonia, or death.   There is also a risk of short-term postoperative urinary retention with need to use a catheter. About half of patients need to go home from surgery with a catheter, which is then later removed in the office. The risk of long-term need for a catheter is very low. There is also a risk of worsening of overactive bladder.    Prolapse (with or without mesh): Risk factors for surgical failure  include things that put pressure on your pelvis and the surgical repair, including obesity, chronic cough, and heavy lifting or straining (including lifting children or adults, straining on the toilet, or lifting heavy objects such as furniture or anything weighing >25 lbs. Risks of recurrence is 20-30% with vaginal native tissue repair and a less than 10% with sacrocolpopexy with mesh.    We discussed consent for blood products. Risks for blood transfusion include allergic reactions, other reactions that can affect different body organs and managed accordingly, transmission of infectious diseases such as HIV or Hepatitis. However, the blood is screened. Patient consents for blood products.  Pre-operative instructions:  She was instructed to not take Aspirin/NSAIDs x 7days prior to surgery.  Antibiotic prophylaxis was ordered as indicated.  Catheter use: Patient will go home with foley if needed after post-operative voiding trial.  Post-operative instructions:  She was provided with specific post-operative instructions, including precautions and signs/symptoms for which we would recommend contacting  us, in addition to daytime and after-hours contact phone numbers. This was provided on a handout.   Post-operative medications: Prescriptions for motrin, tylenol, miralax, and oxycodone were sent to her pharmacy. Discussed using ibuprofen and tylenol on a schedule to limit use of narcotics.   Laboratory testing:  No labs needed  Preoperative clearance:  She does not require surgical clearance.    Post-operative follow-up:  A post-operative appointment will be made for 6  weeks from the date of surgery. If she needs a post-operative nurse visit for a voiding trial, that will be set up after she leaves the hospital.    Patient will call the clinic or use MyChart should anything change or any new issues arise.   Jaquita Folds, MD  Time spent: I spent 20 minutes dedicated to the care of this patient on the date of this encounter to include pre-visit review of records, face-to-face time with the patient  and post visit documentation and ordering medication/ testing.

## 2022-08-08 NOTE — H&P (Signed)
[facility] Pre-Operative H&P  Subjective Chief Complaint: Makayla [name] presents for a preoperative encounter.   History of Present Illness: Makayla [name] is a 62 y.o. female who presents for preoperative visit.  She is scheduled to undergo Exam under anesthesia, posterior repair, perineorrhaphy, sacrospinous ligament fixation on 08/20/22.  Her symptoms include vaginal bulge, and she was was found to have Stage 0 anterior, Stage III posterior, Stage I apical prolapse.   S/p SPARC retropubic sling in 2012.  Past Medical History:  Diagnosis Date   Allergy    pollen   Anxiety    Bursitis, trochanteric    bi/lat    Dental crowns present    x 4   Diverticulosis    GERD (gastroesophageal reflux disease)    Hyperlipidemia    no current med.   Hypertension    white coat syndrome,   Mass of breast, left 01/2012   Obesity      Past Surgical History:  Procedure Laterality Date   ABDOMINAL HYSTERECTOMY     partial   BREAST BIOPSY Right 08/04/2021   BREAST EXCISIONAL BIOPSY Left 2013   BREAST SURGERY  01/10/2012   lumpectomy - left (excisional biopsy benign)   CHOLECYSTECTOMY N/A 02/10/2016   Procedure: LAPAROSCOPIC CHOLECYSTECTOMY WITH INTRAOPERATIVE CHOLANGIOGRAM;  Surgeon: Autumn Messing III, MD;  Location: [facility];  Service: General;  Laterality: N/A;   COLONOSCOPY  04/06/2010   D Brodie, normal w/tics   RECTOCELE REPAIR  06/14/2011   cystocele repair, pt states Dr Vikki Ports did not do rectocele and said she had to come back later.   ROBOTIC ASSISTED LAPAROSCOPIC SACROCOLPOPEXY  06/14/2011   Procedure: ROBOTIC ASSISTED LAPAROSCOPIC SACROCOLPOPEXY;  Surgeon: Dutch Gray, MD;  Location: [facility];  Service: Urology;  Laterality: N/A;   TUBAL LIGATION      is allergic to buspar [buspirone].   Family History  Problem Relation Age of Onset   Hypertension Mother    Hyperlipidemia Mother    Diabetes Mother    Breast cancer Mother 79   Uterine cancer Mother    GER disease  Father    Depression Sister    Heart disease Brother    Heart attack Maternal Grandmother    Hypertension Maternal Grandmother    Colon cancer Neg Hx    Colon polyps Neg Hx    Esophageal cancer Neg Hx    Rectal cancer Neg Hx    Stomach cancer Neg Hx     Social History   Tobacco Use   Smoking status: Never    Passive exposure: Past   Smokeless tobacco: Never  Vaping Use   Vaping Use: Never used  Substance Use Topics   Alcohol use: No    Alcohol/week: 0.0 standard drinks of alcohol   Drug use: No     Review of Systems was negative for a full 10 system review except as noted in the History of Present Illness.  No current facility-administered medications for this encounter.  Current Outpatient Medications:    acetaminophen (TYLENOL) 500 MG tablet, Take 1 tablet (500 mg total) by mouth every 6 (six) hours as needed (pain)., Disp: 30 tablet, Rfl: 0   amLODipine (NORVASC) 2.5 MG tablet, Take 1 tablet (2.5 mg total) by mouth daily., Disp: 90 tablet, Rfl: 3   b complex vitamins capsule, Take 1 capsule by mouth daily., Disp: , Rfl:    esomeprazole (NEXIUM) 20 MG capsule, Take 20 mg by mouth 2 (two) times daily before a meal., Disp: ,  Rfl:    fluticasone (FLONASE) 50 MCG/ACT nasal spray, Place 2 sprays into both nostrils daily as needed., Disp: 48 g, Rfl: 3   hydrocortisone (ANUSOL-HC) 25 MG suppository, Place 1 suppository (25 mg total) rectally 2 (two) times daily as needed for hemorrhoids or anal itching., Disp: 14 suppository, Rfl: 0   hydrOXYzine (ATARAX) 25 MG tablet, Take 1 tablet (25 mg total) by mouth at bedtime as needed., Disp: 30 tablet, Rfl: 3   ibuprofen (ADVIL) 600 MG tablet, Take 1 tablet (600 mg total) by mouth every 6 (six) hours as needed., Disp: 30 tablet, Rfl: 0   ibuprofen (ADVIL) 800 MG tablet, TAKE 1 TABLET BY MOUTH EVERY 8 HOURS AS NEEDED FOR PAIN, Disp: 90 tablet, Rfl: 1   ketoconazole (NIZORAL) 2 % cream, Apply to affected fingernails 1-2 times daily as  needed, Disp: 15 g, Rfl: 0   oxyCODONE (OXY IR/ROXICODONE) 5 MG immediate release tablet, Take 1 tablet (5 mg total) by mouth every 4 (four) hours as needed for severe pain., Disp: 5 tablet, Rfl: 0   polyethylene glycol powder (GLYCOLAX/MIRALAX) 17 GM/SCOOP powder, Take 17 g by mouth daily. Drink 17g (1 scoop) dissolved in water per day., Disp: 255 g, Rfl: 0   sertraline (ZOLOFT) 25 MG tablet, Take 1 tablet (25 mg total) by mouth daily., Disp: 90 tablet, Rfl: 3   Vitamin D, Cholecalciferol, 25 MCG (1000 UT) TABS, Take 5,000 Units by mouth daily., Disp: , Rfl:    Vitamin E 200 units TABS, Take by mouth., Disp: , Rfl:    Objective There were no vitals filed for this visit.   Gen: NAD CV: S1 S2 RRR Lungs: Clear to auscultation bilaterally Abd: soft, nontender   Previous Pelvic Exam showed: POP-Q   -3                                            Aa   -3                                           Ba   -7.5                                              C    7                                            Gh   4.5                                            Pb   9                                            tvl    2  Ap   2                                            Bp                                                  D         Assessment/ Plan  The patient is a 62 y.o. year old scheduled to undergo Exam under anesthesia, posterior repair, perineorrhaphy, sacrospinous ligament fixation .     Jaquita Folds, MD

## 2022-08-15 ENCOUNTER — Encounter (HOSPITAL_BASED_OUTPATIENT_CLINIC_OR_DEPARTMENT_OTHER): Payer: Self-pay | Admitting: Obstetrics and Gynecology

## 2022-08-15 NOTE — Progress Notes (Signed)
Spoke w/ via phone for pre-op interview--- Makayla [name] Lab needs dos----  NONE             Lab results------ Current EKG dated 01/2022 in Epic. COVID test -----patient states asymptomatic no test needed Arrive at -------0530 NPO after MN NO Solid Food.   Med rec completed Medications to take morning of surgery -----Nexium Diabetic medication ----- Patient instructed no nail polish to be worn day of surgery Patient instructed to bring photo id and insurance card day of surgery Patient aware to have Driver (ride ) / caregiver  Daughter [name]  for 24 hours after surgery  Patient Special Instructions ----- Pre-Op special Istructions ----- Patient verbalized understanding of instructions that were given at this phone interview. Patient denies shortness of breath, chest pain, fever, cough at this phone interview.

## 2022-08-19 NOTE — Anesthesia Preprocedure Evaluation (Signed)
Anesthesia Evaluation  Patient identified by MRN, date of birth, ID band Patient awake    Reviewed: Allergy & Precautions, NPO status , Patient's Chart, lab work & pertinent test results  History of Anesthesia Complications (+) PONV and history of anesthetic complications  Airway Mallampati: III  TM Distance: >3 FB Neck ROM: Full    Dental  (+) Dental Advisory Given   Pulmonary neg pulmonary ROS   Pulmonary exam normal breath sounds clear to auscultation       Cardiovascular hypertension (amlodipine), Pt. on medications (-) angina (-) Past MI, (-) Cardiac Stents and (-) CABG (-) dysrhythmias  Rhythm:Regular Rate:Normal  HLD  Negative ETT 04/01/2022  TTE 10/28/2018: INTERPRETATION  NORMAL LEFT VENTRICULAR SYSTOLIC FUNCTION   WITH MILD LVH  NORMAL RIGHT VENTRICULAR SYSTOLIC FUNCTION  MILD VALVULAR REGURGITATION (See above)  NO VALVULAR STENOSIS  MILD TR, PR  TRIVIAL MR  EF 55-60%  Closest EF: >55% (Estimated)  LVH: MILD LVH  Mitral: TRIVIAL MR  Tricuspid: MILD TR     Neuro/Psych  PSYCHIATRIC DISORDERS Anxiety     negative neurological ROS     GI/Hepatic Neg liver ROS,GERD  Medicated,,diverticulosis   Endo/Other  negative endocrine ROS  Pre-diabetes  Renal/GU negative Renal ROS     Musculoskeletal   Abdominal  (+) + obese  Peds  Hematology negative hematology ROS (+)   Anesthesia Other Findings   Reproductive/Obstetrics                             Anesthesia Physical Anesthesia Plan  ASA: 3  Anesthesia Plan: General   Post-op Pain Management: Tylenol PO (pre-op)*   Induction: Intravenous  PONV Risk Score and Plan: 4 or greater and Ondansetron, Dexamethasone, Midazolam, Scopolamine patch - Pre-op and Treatment may vary due to age or medical condition  Airway Management Planned: Oral ETT  Additional Equipment:   Intra-op Plan:   Post-operative Plan: Extubation in  OR  Informed Consent: I have reviewed the patients History and Physical, chart, labs and discussed the procedure including the risks, benefits and alternatives for the proposed anesthesia with the patient or authorized representative who has indicated his/her understanding and acceptance.     Dental advisory given  Plan Discussed with: CRNA and Anesthesiologist  Anesthesia Plan Comments: (Risks of general anesthesia discussed including, but not limited to, sore throat, hoarse voice, chipped/damaged teeth, injury to vocal cords, nausea and vomiting, allergic reactions, lung infection, heart attack, stroke, and death. All questions answered. )        Anesthesia Quick Evaluation

## 2022-08-20 ENCOUNTER — Ambulatory Visit (HOSPITAL_BASED_OUTPATIENT_CLINIC_OR_DEPARTMENT_OTHER): Payer: Managed Care, Other (non HMO) | Admitting: Anesthesiology

## 2022-08-20 ENCOUNTER — Encounter (HOSPITAL_BASED_OUTPATIENT_CLINIC_OR_DEPARTMENT_OTHER): Payer: Self-pay | Admitting: Obstetrics and Gynecology

## 2022-08-20 ENCOUNTER — Encounter (HOSPITAL_BASED_OUTPATIENT_CLINIC_OR_DEPARTMENT_OTHER)
Admission: RE | Disposition: A | Discharge status: Home / Self Care | Payer: Self-pay | Source: Ambulatory Visit | Attending: Obstetrics and Gynecology

## 2022-08-20 ENCOUNTER — Ambulatory Visit (HOSPITAL_BASED_OUTPATIENT_CLINIC_OR_DEPARTMENT_OTHER)
Admission: RE | Admit: 2022-08-20 | Discharge: 2022-08-20 | Disposition: A | Discharge status: Home / Self Care | Payer: Managed Care, Other (non HMO) | Source: Ambulatory Visit | Attending: Obstetrics and Gynecology | Admitting: Obstetrics and Gynecology

## 2022-08-20 ENCOUNTER — Telehealth: Payer: Self-pay | Admitting: Obstetrics and Gynecology

## 2022-08-20 ENCOUNTER — Other Ambulatory Visit: Payer: Self-pay

## 2022-08-20 DIAGNOSIS — K219 Gastro-esophageal reflux disease without esophagitis: Secondary | ICD-10-CM | POA: Insufficient documentation

## 2022-08-20 DIAGNOSIS — N815 Vaginal enterocele: Secondary | ICD-10-CM | POA: Diagnosis not present

## 2022-08-20 DIAGNOSIS — Z9889 Other specified postprocedural states: Secondary | ICD-10-CM

## 2022-08-20 DIAGNOSIS — N993 Prolapse of vaginal vault after hysterectomy: Secondary | ICD-10-CM | POA: Insufficient documentation

## 2022-08-20 DIAGNOSIS — I10 Essential (primary) hypertension: Secondary | ICD-10-CM | POA: Insufficient documentation

## 2022-08-20 DIAGNOSIS — E669 Obesity, unspecified: Secondary | ICD-10-CM | POA: Insufficient documentation

## 2022-08-20 DIAGNOSIS — N816 Rectocele: Secondary | ICD-10-CM | POA: Diagnosis present

## 2022-08-20 DIAGNOSIS — R7303 Prediabetes: Secondary | ICD-10-CM | POA: Insufficient documentation

## 2022-08-20 DIAGNOSIS — E785 Hyperlipidemia, unspecified: Secondary | ICD-10-CM | POA: Diagnosis not present

## 2022-08-20 DIAGNOSIS — Z6839 Body mass index (BMI) 39.0-39.9, adult: Secondary | ICD-10-CM | POA: Insufficient documentation

## 2022-08-20 HISTORY — DX: Other specified postprocedural states: Z98.890

## 2022-08-20 HISTORY — DX: Prediabetes: R73.03

## 2022-08-20 HISTORY — PX: ANTERIOR AND POSTERIOR REPAIR WITH SACROSPINOUS FIXATION: SHX6536

## 2022-08-20 HISTORY — DX: Nausea with vomiting, unspecified: R11.2

## 2022-08-20 SURGERY — ANTERIOR AND POSTERIOR REPAIR WITH SACROSPINOUS FIXATION
Anesthesia: General | Site: Uterus

## 2022-08-20 MED ORDER — ENOXAPARIN SODIUM 40 MG/0.4ML IJ SOSY
PREFILLED_SYRINGE | INTRAMUSCULAR | Status: AC
  Filled 2022-08-20: qty 0.4

## 2022-08-20 MED ORDER — POVIDONE-IODINE 10 % EX SWAB: 2.0000 | Freq: Once | CUTANEOUS | Status: DC

## 2022-08-20 MED ORDER — PHENYLEPHRINE 80 MCG/ML (10ML) SYRINGE FOR IV PUSH (FOR BLOOD PRESSURE SUPPORT)
PREFILLED_SYRINGE | INTRAVENOUS | Status: AC
  Filled 2022-08-20: qty 10

## 2022-08-20 MED ORDER — MIDAZOLAM HCL 2 MG/2ML IJ SOLN
INTRAMUSCULAR | Status: AC
  Filled 2022-08-20: qty 2

## 2022-08-20 MED ORDER — LIDOCAINE-EPINEPHRINE 1 %-1:100000 IJ SOLN
INTRAMUSCULAR | Status: DC | PRN
  Administered 2022-08-20: 20 mL
  Administered 2022-08-20: 15 mL

## 2022-08-20 MED ORDER — OXYCODONE HCL 5 MG/5ML PO SOLN: 5.0000 mg | Freq: Once | ORAL | Status: DC | PRN

## 2022-08-20 MED ORDER — LACTATED RINGERS IV SOLN: INTRAVENOUS | Status: DC

## 2022-08-20 MED ORDER — PHENYLEPHRINE HCL (PRESSORS) 10 MG/ML IV SOLN
INTRAVENOUS | Status: DC | PRN
  Administered 2022-08-20 (×9): 80 ug via INTRAVENOUS

## 2022-08-20 MED ORDER — ONDANSETRON HCL 4 MG/2ML IJ SOLN
INTRAMUSCULAR | Status: DC | PRN
  Administered 2022-08-20: 4 mg via INTRAVENOUS

## 2022-08-20 MED ORDER — FENTANYL CITRATE (PF) 100 MCG/2ML IJ SOLN
INTRAMUSCULAR | Status: DC | PRN
  Administered 2022-08-20 (×4): 25 ug via INTRAVENOUS

## 2022-08-20 MED ORDER — 0.9 % SODIUM CHLORIDE (POUR BTL) OPTIME
TOPICAL | Status: DC | PRN
  Administered 2022-08-20: 500 mL

## 2022-08-20 MED ORDER — DEXAMETHASONE SODIUM PHOSPHATE 10 MG/ML IJ SOLN
INTRAMUSCULAR | Status: AC
  Filled 2022-08-20: qty 1

## 2022-08-20 MED ORDER — ACETAMINOPHEN 500 MG PO TABS
1000.0000 mg | ORAL_TABLET | ORAL | Status: AC
  Administered 2022-08-20: 1000 mg via ORAL

## 2022-08-20 MED ORDER — CEFAZOLIN SODIUM-DEXTROSE 2-4 GM/100ML-% IV SOLN
2.0000 g | INTRAVENOUS | Status: AC
  Administered 2022-08-20: 2 g via INTRAVENOUS

## 2022-08-20 MED ORDER — ACETAMINOPHEN 500 MG PO TABS
ORAL_TABLET | ORAL | Status: AC
  Filled 2022-08-20: qty 2

## 2022-08-20 MED ORDER — GABAPENTIN 300 MG PO CAPS
ORAL_CAPSULE | ORAL | Status: AC
  Filled 2022-08-20: qty 1

## 2022-08-20 MED ORDER — FENTANYL CITRATE (PF) 100 MCG/2ML IJ SOLN: 25.0000 ug | INTRAMUSCULAR | Status: DC | PRN

## 2022-08-20 MED ORDER — FENTANYL CITRATE (PF) 100 MCG/2ML IJ SOLN
INTRAMUSCULAR | Status: AC
  Filled 2022-08-20: qty 2

## 2022-08-20 MED ORDER — SCOPOLAMINE 1 MG/3DAYS TD PT72
MEDICATED_PATCH | TRANSDERMAL | Status: AC
  Filled 2022-08-20: qty 1

## 2022-08-20 MED ORDER — MIDAZOLAM HCL 5 MG/5ML IJ SOLN
INTRAMUSCULAR | Status: DC | PRN
  Administered 2022-08-20: 2 mg via INTRAVENOUS

## 2022-08-20 MED ORDER — SCOPOLAMINE 1 MG/3DAYS TD PT72
1.0000 | MEDICATED_PATCH | TRANSDERMAL | Status: DC
  Administered 2022-08-20: 1.5 mg via TRANSDERMAL

## 2022-08-20 MED ORDER — PROPOFOL 10 MG/ML IV BOLUS
INTRAVENOUS | Status: AC
  Filled 2022-08-20: qty 20

## 2022-08-20 MED ORDER — PROPOFOL 10 MG/ML IV BOLUS
INTRAVENOUS | Status: DC | PRN
  Administered 2022-08-20: 20 mg via INTRAVENOUS
  Administered 2022-08-20: 50 mg via INTRAVENOUS
  Administered 2022-08-20: 200 mg via INTRAVENOUS
  Administered 2022-08-20: 50 mg via INTRAVENOUS

## 2022-08-20 MED ORDER — DEXAMETHASONE SODIUM PHOSPHATE 4 MG/ML IJ SOLN
INTRAMUSCULAR | Status: DC | PRN
  Administered 2022-08-20: 5 mg via INTRAVENOUS

## 2022-08-20 MED ORDER — LIDOCAINE HCL (CARDIAC) PF 100 MG/5ML IV SOSY
PREFILLED_SYRINGE | INTRAVENOUS | Status: DC | PRN
  Administered 2022-08-20: 100 mg via INTRAVENOUS

## 2022-08-20 MED ORDER — ENOXAPARIN SODIUM 40 MG/0.4ML IJ SOSY
40.0000 mg | PREFILLED_SYRINGE | INTRAMUSCULAR | Status: AC
  Administered 2022-08-20: 40 mg via SUBCUTANEOUS

## 2022-08-20 MED ORDER — ONDANSETRON HCL 4 MG/2ML IJ SOLN
INTRAMUSCULAR | Status: AC
  Filled 2022-08-20: qty 2

## 2022-08-20 MED ORDER — CEFAZOLIN SODIUM-DEXTROSE 2-4 GM/100ML-% IV SOLN
INTRAVENOUS | Status: AC
  Filled 2022-08-20: qty 100

## 2022-08-20 MED ORDER — KETOROLAC TROMETHAMINE 30 MG/ML IJ SOLN
INTRAMUSCULAR | Status: DC | PRN
  Administered 2022-08-20: 30 mg via INTRAVENOUS

## 2022-08-20 MED ORDER — OXYCODONE HCL 5 MG PO TABS: 5.0000 mg | ORAL_TABLET | Freq: Once | ORAL | Status: DC | PRN

## 2022-08-20 MED ORDER — LIDOCAINE HCL (PF) 2 % IJ SOLN
INTRAMUSCULAR | Status: AC
  Filled 2022-08-20: qty 5

## 2022-08-20 MED ORDER — GABAPENTIN 300 MG PO CAPS
300.0000 mg | ORAL_CAPSULE | ORAL | Status: AC
  Administered 2022-08-20: 300 mg via ORAL

## 2022-08-20 MED ORDER — PROMETHAZINE HCL 25 MG/ML IJ SOLN: 6.2500 mg | INTRAMUSCULAR | Status: DC | PRN

## 2022-08-20 SURGICAL SUPPLY — 33 items
AGENT HMST KT MTR STRL THRMB (HEMOSTASIS)
BLADE CLIPPER SENSICLIP SURGIC (BLADE) ×1 IMPLANT
BLADE SURG 15 STRL LF DISP TIS (BLADE) ×1 IMPLANT
BLADE SURG 15 STRL SS (BLADE) ×1
DEVICE CAPIO SLIM SINGLE (INSTRUMENTS) IMPLANT
GAUZE 4X4 16PLY ~~LOC~~+RFID DBL (SPONGE) IMPLANT
GLOVE BIOGEL PI IND STRL 6.5 (GLOVE) ×1 IMPLANT
GLOVE BIOGEL PI IND STRL 7.0 (GLOVE) ×1 IMPLANT
GLOVE ECLIPSE 6.0 STRL STRAW (GLOVE) ×1 IMPLANT
GOWN STRL REUS W/TWL LRG LVL3 (GOWN DISPOSABLE) ×1 IMPLANT
HIBICLENS CHG 4% 4OZ BTL (MISCELLANEOUS) ×1 IMPLANT
HOLDER FOLEY CATH W/STRAP (MISCELLANEOUS) ×1 IMPLANT
KIT TURNOVER CYSTO (KITS) ×1 IMPLANT
NDL MAYO 6 CRC TAPER PT (NEEDLE) IMPLANT
NEEDLE HYPO 22GX1.5 SAFETY (NEEDLE) ×1 IMPLANT
NEEDLE MAYO 6 CRC TAPER PT (NEEDLE) ×1 IMPLANT
NS IRRIG 1000ML POUR BTL (IV SOLUTION) ×1 IMPLANT
PACK VAGINAL WOMENS (CUSTOM PROCEDURE TRAY) ×1 IMPLANT
PAD OB MATERNITY 4.3X12.25 (PERSONAL CARE ITEMS) ×1 IMPLANT
RETRACTOR LONE STAR DISPOSABLE (INSTRUMENTS) ×1 IMPLANT
RETRACTOR STAY HOOK 5MM (MISCELLANEOUS) ×1 IMPLANT
SET IRRIG Y TYPE TUR BLADDER L (SET/KITS/TRAYS/PACK) ×1 IMPLANT
SPIKE FLUID TRANSFER (MISCELLANEOUS) IMPLANT
SURGIFLO W/THROMBIN 8M KIT (HEMOSTASIS) IMPLANT
SUT ABS MONO DBL WITH NDL 48IN (SUTURE) IMPLANT
SUT VIC AB 0 CT1 27 (SUTURE)
SUT VIC AB 0 CT1 27XBRD ANBCTR (SUTURE) IMPLANT
SUT VIC AB 2-0 SH 27 (SUTURE)
SUT VIC AB 2-0 SH 27XBRD (SUTURE) IMPLANT
SUT VICRYL 2-0 SH 8X27 (SUTURE) ×1 IMPLANT
SYR BULB EAR ULCER 3OZ GRN STR (SYRINGE) ×1 IMPLANT
TOWEL OR 17X26 10 PK STRL BLUE (TOWEL DISPOSABLE) ×1 IMPLANT
TRAY FOLEY W/BAG SLVR 14FR LF (SET/KITS/TRAYS/PACK) ×1 IMPLANT

## 2022-08-20 NOTE — Discharge Instructions (Addendum)
POST OPERATIVE INSTRUCTIONS  General Instructions Recovery (not bed rest) will last approximately 6 weeks Walking is encouraged, but refrain from strenuous exercise/ housework/ heavy lifting. No lifting >10lbs  Nothing in the vagina- NO intercourse, tampons or douching Bathing:  Do not submerge in water (NO swimming, bath, hot tub, etc) until after your postop visit. You can shower starting the day after surgery.  No driving until you are not taking narcotic pain medicine and until your pain is well enough controlled that you can slam on the breaks or make sudden movements if needed.   Taking your medications Please take your acetaminophen and ibuprofen on a schedule for the first 48 hours. Take 600mg ibuprofen, then take 500mg acetaminophen 3 hours later, then continue to alternate ibuprofen and acetaminophen. That way you are taking each type of medication every 6 hours. Take the prescribed narcotic (oxycodone, tramadol, etc) as needed, with a maximum being every 4 hours.  Take a stool softener daily to keep your stools soft and preventing you from straining. If you have diarrhea, you decrease your stool softener. This is explained more below. We have prescribed you Miralax.  Reasons to Call the Nurse (see last page for phone numbers) Heavy Bleeding (changing your pad every 1-2 hours) Persistent nausea/vomiting Fever (100.4 degrees or more) Incision problems (pus or other fluid coming out, redness, warmth, increased pain)  Things to Expect After Surgery Mild to Moderate pain is normal during the first day or two after surgery. If prescribed, take Ibuprofen or Tylenol first and use the stronger medicine for "break-through" pain. You can overlap these medicines because they work differently.   Constipation   To Prevent Constipation:  Eat a well-balanced diet including protein, grains, fresh fruit and vegetables.  Drink plenty of fluids. Walk regularly.  Depending on specific instructions  from your physician: take Miralax daily and additionally you can add a stool softener (colace/ docusate) and fiber supplement. Continue as long as you're on pain medications.   To Treat Constipation:  If you do not have a bowel movement in 2 days after surgery, you can take 2 Tbs of Milk of Magnesia 1-2 times a day until you have a bowel movement. If diarrhea occurs, decrease the amount or stop the laxative. If no results with Milk of Magnesia, you can drink a bottle of magnesium citrate which you can purchase over the counter.  Fatigue:  This is a normal response to surgery and will improve with time.  Plan frequent rest periods throughout the day.  Gas Pain:  This is very common but can also be very painful! Drink warm liquids such as herbal teas, bouillon or soup. Walking will help you pass more gas.  Mylicon or Gas-X can be taken over the counter.  Leaking Urine:  Varying amounts of leakage may occur after surgery.  This should improve with time. Your bladder needs at least 3 months to recover from surgery. If you leak after surgery, be sure to mention this to your doctor at your post-op visit. If you were taking medications for overactive bladder prior to surgery, be sure to restart the medications immediately after surgery.  Incisions: If you have incisions on your abdomen, the skin glue will dissolve on its own over time. It is ok to gently rinse with soap and water over these incisions but do not scrub.  Catheter Approximately 50% of patients are unable to urinate after surgery and need to go home with a catheter. This allows your bladder to   rest so it can return to full function. If you go home with a catheter, the office will call to set up a voiding trial a few days after surgery. For most patients, by this visit, they are able to urinate on their own. Long term catheter use is rare.   Return to Work  As work demands and recovery times vary widely, it is hard to predict when you will want  to return to work. If you have a desk job with no strenuous physical activity, and if you would like to return sooner than generally recommended, discuss this with your provider or call our office.   Post op concerns  For non-emergent issues, please call the Urogynecology Nurse. Please leave a message and someone will contact you within one business day.  You can also send a message through [facility].   AFTER HOURS (After 5:00 PM and on weekends):  For urgent matters that cannot wait until the next business day. Call our office [phone] and connect to the doctor on call.  Please reserve this for important issues.   **FOR ANY TRUE EMERGENCY ISSUES CALL 911 OR GO TO THE NEAREST EMERGENCY ROOM.** Please inform our office or the doctor on call of any emergency.     APPOINTMENTS: Call [phone]   Post Anesthesia Home Care Instructions  Activity: Get plenty of rest for the remainder of the day. A responsible individual must stay with you for 24 hours following the procedure.  For the next 24 hours, DO NOT: -Drive a car -Paediatric nurse -Drink alcoholic beverages -Take any medication unless instructed by your physician -Make any legal decisions or sign important papers.  Meals: Start with liquid foods such as gelatin or soup. Progress to regular foods as tolerated. Avoid greasy, spicy, heavy foods. If nausea and/or vomiting occur, drink only clear liquids until the nausea and/or vomiting subsides. Call your physician if vomiting continues.  Special Instructions/Symptoms: Your throat may feel dry or sore from the anesthesia or the breathing tube placed in your throat during surgery. If this causes discomfort, gargle with warm salt water. The discomfort should disappear within 24 hours.  If you had a scopolamine patch placed behind your ear for the management of post- operative nausea and/or vomiting:  1. The medication in the patch is effective for 72 hours, after which it should be  removed.  Wrap patch in a tissue and discard in the trash. Wash hands thoroughly with soap and water. 2. You may remove the patch earlier than 72 hours if you experience unpleasant side effects which may include dry mouth, dizziness or visual disturbances. 3. Avoid touching the patch. Wash your hands with soap and water after contact with the patch.    Remove patch behind left ear by Thursday. August 23, 2022.

## 2022-08-20 NOTE — Op Note (Signed)
Operative Note  Preoperative Diagnosis: posterior vaginal prolapse, enterocele, vaginal vault prolapse after hysterectomy  Postoperative Diagnosis: same  Procedures performed:  Posterior repair, enterocele repair, perineorrhaphy, sacrospinous ligament fixation  Implants: none  Attending Surgeon: Sherlene Shams, MD  Anesthesia: General LMA  Findings: On vaginal exam, stage III posterior- apical prolapse noted   Specimens: none  Estimated blood loss: 50 mL  IV fluids: see flowsheet  Urine output: see flowsheet  Complications: none  Procedure in Detail:  After informed consent was obtained, the patient was taken to the operating room where anesthesia was induced and found to be adequate. She was placed in dorsal lithotomy position, taking care to avoid any traction on the extremities, and then prepped and draped in the usual sterile fashion. A self-retaining lonestar retractor was placed using four elastic blue stays.  After a foley catheter was inserted into the urethra.  Vaginal exam concluded that an apical procedure was also needed. Two Allis clamps were along the posterior vaginal wall defect. 1% lidocaine with epinephrine was injected into the vaginal mucosa.  A vertical incision was made between these two Allis clamps with a 15 blade scalpel.  Allis clamps were placed along this incision and Metzenbaum scissors were used to undermine the vaginal mucosa along the incision.  The vaginal mucosa was then sharply dissected off to the septum bilaterally.    For the sacrospinous ligament fixation (SSLF), the ischial spine was accessed on the right side via dissection with scissors and blunt dissection.  The sacrospinous ligament was palpated. Two 0 PDS suture was then placed at the sacrospinous ligament two fingerbreadths medial to the ischial spine, in order to avoid the pudendal neurovascular bundle, using a Capio needle driver.  The PDS suture was attached to the vaginal epithelium  on the ipsilateral side of the vaginal apex and held. The enterocele was reduced with serial pursestring sutures.  Posterior plication of the rectovaginal septum was then performed using plicating sutures of 2-0 Vicryl. The last distal stitch incorporated the perineal body in a U stitch fashion. The vaginal mucosal edges were trimmed and the incision reapproximated with 2-0 Vicryl in a running fashion. The SSLF suture was then tied down with excellent support of the posterior and apical vagina.  Perineorrhaphy was then performed. Two allis clamps were placed at the introitus and the perineum was injected with local anesthetic. A diamond shaped incision was made over the perineum with a scalpel and the excess epithelium was removed. The underlying tissue was dissected with metzenbaum scissors. Using a 0-vicryl suture, the perineal body was reapproximately with two interrupted sutures. The perineal skin was then closed in a subcutaneous and subcuticular fashion with 2-0 vicryl. The vagina was copiously irrigated.  Hemostasis was noted.  Vaginal packing was not placed.  A rectal examination was normal and confirmed no sutures within the rectum. The patient tolerated the procedure well.  She was awakened from anesthesia and transferred to the recovery room in stable condition. All counts were correct x 2.    Jaquita Folds, MD

## 2022-08-20 NOTE — Interval H&P Note (Signed)
History and Physical Interval Note:  08/20/2022 7:15 AM  Makayla [name]  has presented today for surgery, with the diagnosis of posterior vaginal prolapse; prolapse of vaginal vault after hysterectomy.  The various methods of treatment have been discussed with the patient and family. After consideration of risks, benefits and other options for treatment, the patient has consented to  Procedure(s) with comments: POSTERIOR REPAIR WITH PERINEORRHAPHY AND WITH  SACROSPINOUS FIXATION (N/A)  as a surgical intervention.  The patient's history has been reviewed, patient examined, no change in status, stable for surgery.  I have reviewed the patient's chart and labs.  Questions were answered to the patient's satisfaction.     Jaquita Folds

## 2022-08-20 NOTE — Progress Notes (Signed)
Dr. Christin Fudge aware of bladder scan 0 ml after voiding 300 ml. OK to go home without catheter.

## 2022-08-20 NOTE — Anesthesia Procedure Notes (Signed)
Procedure Name: LMA Insertion Date/Time: 08/20/2022 7:40 AM  Performed by: Justice Rocher, CRNAPre-anesthesia Checklist: Patient identified, Emergency Drugs available, Suction available, Patient being monitored and Timeout performed Patient Re-evaluated:Patient Re-evaluated prior to induction Oxygen Delivery Method: Circle system utilized Preoxygenation: Pre-oxygenation with 100% oxygen Induction Type: IV induction Ventilation: Mask ventilation without difficulty LMA: LMA inserted LMA Size: 4.0 Number of attempts: 1 Airway Equipment and Method: Bite block Placement Confirmation: positive ETCO2, breath sounds checked- equal and bilateral and CO2 detector Tube secured with: Tape Dental Injury: Teeth and Oropharynx as per pre-operative assessment

## 2022-08-20 NOTE — Telephone Encounter (Signed)
Makayla [name] underwent Posterior repair, enterocele repair, perineorrhaphy, sacrospinous ligament fixation  on 08/20/22.   She passed her voiding trial.  367m was backfilled into the bladder Voided 3032m PVR by bladder scan was 55m35m  She was discharged without a catheter. Please call her for a routine post op check. Thanks!  MicJaquita FoldsD

## 2022-08-20 NOTE — Transfer of Care (Signed)
Immediate Anesthesia Transfer of Care Note  Patient: Makayla [name]  Procedure(s) Performed: Procedure(s) (LRB): POSTERIOR REPAIR WITH PERINEORRHAPHY AND WITH  SACROSPINOUS FIXATION (N/A)  Patient Location: PACU  Anesthesia Type: General  Level of Consciousness: awake, sedated, patient cooperative and responds to stimulation  Airway & Oxygen Therapy: Patient Spontanous Breathing and Patient connected to [place] oxygen  Post-op Assessment: Report given to PACU RN, Post -op Vital signs reviewed and stable and Patient moving all extremities  Post vital signs: Reviewed and stable  Complications: No apparent anesthesia complications

## 2022-08-20 NOTE — Anesthesia Postprocedure Evaluation (Signed)
Anesthesia Post Note  Patient: Makayla [name]  Procedure(s) Performed: POSTERIOR REPAIR WITH PERINEORRHAPHY AND WITH  SACROSPINOUS FIXATION (Uterus)     Patient location during evaluation: PACU Anesthesia Type: General Level of consciousness: awake Pain management: pain level controlled Vital Signs Assessment: post-procedure vital signs reviewed and stable Respiratory status: spontaneous breathing, nonlabored ventilation and respiratory function stable Cardiovascular status: blood pressure returned to baseline and stable Postop Assessment: no apparent nausea or vomiting Anesthetic complications: no   No notable events documented.  Last Vitals:  Vitals:   08/20/22 1000 08/20/22 1015  BP: 121/64 129/66  Pulse: (!) 105 (!) 109  Resp: 13 11  Temp:    SpO2: 95% 98%    Last Pain:  Vitals:   08/20/22 1000  TempSrc:   PainSc: 0-No pain                 Nilda Simmer

## 2022-08-21 ENCOUNTER — Encounter (HOSPITAL_BASED_OUTPATIENT_CLINIC_OR_DEPARTMENT_OTHER): Payer: Self-pay | Admitting: Obstetrics and Gynecology

## 2022-08-21 MED ORDER — GLYCERIN (ADULT) 2 G RE SUPP: 1.0000 | RECTAL | 0 refills | Status: DC | PRN

## 2022-08-21 NOTE — Telephone Encounter (Addendum)
Called and spoke to patient: She reports Pain rated on scale of 5/10 around her rectum and in the perineal area. Has been taking motrin. Took one dose of narcotic pain medication last night. Reports high amount of soreness. Reports she has been resting. We discussed if her pain is not controlled we can consider other avenues of pain management including Gabapentin or muscle relaxants.  BM: reports she has not been able to have a BM yet. Reports she is taking Miralax and plans to take a dose of milk of magnesia this evening if no BM. Glycerin suppositories sent in as well if patient should need them to promote ease of BM.  Normal urination. Reports scant pink bleeding, denies clots or heavy bleeding.

## 2022-08-21 NOTE — Addendum Note (Signed)
Addended by: Berton Mount on: 08/21/2022 02:38 PM   Modules accepted: Orders

## 2022-08-28 ENCOUNTER — Encounter: Payer: Self-pay | Admitting: Obstetrics and Gynecology

## 2022-08-28 ENCOUNTER — Telehealth: Payer: Self-pay | Admitting: Obstetrics and Gynecology

## 2022-08-28 NOTE — Telephone Encounter (Signed)
Patient called stating she had developed a little blood spotting that is of concern. She reports she was more active yesterday than she had previously been which may have caused some irritation. She states she has not been lifting anything heavy or doing strenuous activity. She states she has a lot of pressure in her rectum. Encouraged her to do a dose of milk of magnesia on top of her miralax to ease her bowel movement.   Encouraged her that if she starts to have any heavier bleeding or dark red bleeding that she should come in for an evaluation if she is concerned.

## 2022-08-31 ENCOUNTER — Encounter: Payer: Self-pay | Admitting: Obstetrics and Gynecology

## 2022-08-31 ENCOUNTER — Telehealth: Payer: Self-pay | Admitting: Obstetrics and Gynecology

## 2022-08-31 NOTE — Telephone Encounter (Signed)
Called and spoke to patient. She reports she is doing better today and has not had any increase in bleeding. Reports scant amounts of blood but no clots and not saturating pads. She reports she is doing well overall. She was encouraged to call if she has any concerns.

## 2022-09-13 ENCOUNTER — Encounter (INDEPENDENT_AMBULATORY_CARE_PROVIDER_SITE_OTHER): Payer: Self-pay

## 2022-09-13 ENCOUNTER — Ambulatory Visit: Payer: Managed Care, Other (non HMO) | Admitting: Obstetrics and Gynecology

## 2022-09-13 ENCOUNTER — Other Ambulatory Visit (HOSPITAL_COMMUNITY)
Admission: RE | Admit: 2022-09-13 | Discharge: 2022-09-13 | Disposition: A | Discharge status: Home / Self Care | Payer: Managed Care, Other (non HMO) | Source: Ambulatory Visit | Attending: Obstetrics and Gynecology | Admitting: Obstetrics and Gynecology

## 2022-09-13 ENCOUNTER — Ambulatory Visit (INDEPENDENT_AMBULATORY_CARE_PROVIDER_SITE_OTHER): Payer: Managed Care, Other (non HMO) | Admitting: Family Medicine

## 2022-09-13 ENCOUNTER — Encounter: Payer: Self-pay | Admitting: Obstetrics and Gynecology

## 2022-09-13 VITALS — BP 133/83 | HR 106

## 2022-09-13 DIAGNOSIS — Z9889 Other specified postprocedural states: Secondary | ICD-10-CM

## 2022-09-13 DIAGNOSIS — N898 Other specified noninflammatory disorders of vagina: Secondary | ICD-10-CM | POA: Insufficient documentation

## 2022-09-13 DIAGNOSIS — N952 Postmenopausal atrophic vaginitis: Secondary | ICD-10-CM

## 2022-09-13 MED ORDER — FLUCONAZOLE 150 MG PO TABS: 150.0000 mg | ORAL_TABLET | Freq: Once | ORAL | 0 refills | Status: AC

## 2022-09-13 MED ORDER — ESTRADIOL 0.1 MG/GM VA CREA: 0.5000 g | TOPICAL_CREAM | VAGINAL | 11 refills | Status: DC

## 2022-09-13 NOTE — Progress Notes (Signed)
[facility] Return Visit  SUBJECTIVE  History of Present Illness: Makayla [name] is a 62 y.o. female seen in follow-up for surgical area discomfort.   She had posterior repair with perineorrhaphy and sacrospinous fixation. She reports pain in the labial area on the left side and reports some irritation around the vaginal opening.     Past Medical History: Patient  has a past medical history of Allergy, Anxiety, Bursitis, trochanteric, Dental crowns present, Diverticulosis, GERD (gastroesophageal reflux disease), Hyperlipidemia, Hypertension, Mass of breast, left (01/2012), Obesity, PONV (postoperative nausea and vomiting), and Pre-diabetes.   Past Surgical History: She  has a past surgical history that includes Tubal ligation; Robotic assisted laparoscopic sacrocolpopexy (06/14/2011); Rectocele repair (06/14/2011); Abdominal hysterectomy; Breast surgery (01/10/2012); Cholecystectomy (N/A, 02/10/2016); Breast excisional biopsy (Left, 2013); Colonoscopy (04/06/2010); Breast biopsy (Right, 08/04/2021); and Anterior and posterior repair with sacrospinous fixation (N/A, 08/20/2022).   Medications: She has a current medication list which includes the following prescription(s): acetaminophen, amlodipine, b complex vitamins, esomeprazole, estradiol, fluconazole, ibuprofen, ibuprofen, polyethylene glycol powder, sertraline, vitamin d (cholecalciferol), fluticasone, glycerin adult, hydrocortisone, hydroxyzine, ketoconazole, oxycodone, and vitamin e.   Allergies: Patient is allergic to buspar [buspirone].   Social History: Patient  reports that she has never smoked. She has been exposed to tobacco smoke. She has never used smokeless tobacco. She reports that she does not drink alcohol and does not use drugs.      OBJECTIVE     Physical Exam: Vitals:   09/13/22 1040  BP: 133/83  Pulse: (!) 106   Gen: No apparent distress, A&O x 3.  Detailed Urogynecologic Evaluation:  Upon exam  patient has some thick discharge on the left labia. Clumps noted to also be around the clitoris and near the urethra. Internal exam shows irritation directly past the introitus where the suture line is but no bleeding, pus, or dehiscence noted.     ASSESSMENT AND PLAN    Makayla [name] is a 62 y.o. with:  1. Post-operative state   2. Vaginal atrophy   3. Vaginal itching    Overall patient is doing well.  Will send in Estrace cream to assist in wound healing around the introitus. Patient to use the cream nightly for two weeks then twice weekly following.  Patient also has a concerning area for yeast. Aptima swab obtained and will treat with Diflucan.   Patient to see Dr. Wannetta Sender for routine post-op on 2/26 and will keep that appointment.

## 2022-09-13 NOTE — Patient Instructions (Signed)
Take the dose of diflucan  Also will send in estrogen cream which you will use nightly for two weeks and then twice a week.

## 2022-09-13 NOTE — Progress Notes (Deleted)
[facility] Return Visit  SUBJECTIVE  History of Present Illness: MAZELLA DEEN is a 62 y.o. female seen in follow-up for surgical area discomfort.   She had posterior repair with perineorrhaphy and sacrospinous fixation. She reports pain in the labial area on the left side and reports some irritation around the vaginal opening.     Past Medical History: Patient  has a past medical history of Allergy, Anxiety, Bursitis, trochanteric, Dental crowns present, Diverticulosis, GERD (gastroesophageal reflux disease), Hyperlipidemia, Hypertension, Mass of breast, left (01/2012), Obesity, PONV (postoperative nausea and vomiting), and Pre-diabetes.   Past Surgical History: She  has a past surgical history that includes Tubal ligation; Robotic assisted laparoscopic sacrocolpopexy (06/14/2011); Rectocele repair (06/14/2011); Abdominal hysterectomy; Breast surgery (01/10/2012); Cholecystectomy (N/A, 02/10/2016); Breast excisional biopsy (Left, 2013); Colonoscopy (04/06/2010); Breast biopsy (Right, 08/04/2021); and Anterior and posterior repair with sacrospinous fixation (N/A, 08/20/2022).   Medications: She has a current medication list which includes the following prescription(s): acetaminophen, amlodipine, b complex vitamins, esomeprazole, estradiol, fluconazole, ibuprofen, ibuprofen, polyethylene glycol powder, sertraline, vitamin d (cholecalciferol), fluticasone, glycerin adult, hydrocortisone, hydroxyzine, ketoconazole, oxycodone, and vitamin e.   Allergies: Patient is allergic to buspar [buspirone].   Social History: Patient  reports that she has never smoked. She has been exposed to tobacco smoke. She has never used smokeless tobacco. She reports that she does not drink alcohol and does not use drugs.      OBJECTIVE     Physical Exam: Vitals:   09/13/22 1040  BP: 133/83  Pulse: (!) 106   Gen: No apparent distress, A&O x 3.  Detailed Urogynecologic Evaluation:  Upon exam  patient has some thick discharge on the left labia. Clumps noted to also be around the clitoris and near the urethra. Internal exam shows irritation directly past the introitus where the suture line is but no bleeding, pus, or dehiscence noted.     ASSESSMENT AND PLAN    Ms. Labrake is a 62 y.o. with:  1. Post-operative state   2. Vaginal atrophy   3. Vaginal itching    Overall patient is doing well.  Will send in Estrace cream to assist in wound healing around the introitus. Patient to use the cream nightly for two weeks then twice weekly following.  Patient also has a concerning area for yeast. Aptima swab obtained and will treat with Diflucan.   Patient to see Dr. Wannetta Sender for routine post-op on 2/26 and will keep that appointment.

## 2022-09-14 LAB — CERVICOVAGINAL ANCILLARY ONLY
Bacterial Vaginitis (gardnerella): NEGATIVE
Candida Glabrata: NEGATIVE
Candida Vaginitis: NEGATIVE
Comment: NEGATIVE
Comment: NEGATIVE
Comment: NEGATIVE

## 2022-09-17 ENCOUNTER — Telehealth: Payer: Self-pay | Admitting: Obstetrics and Gynecology

## 2022-09-17 ENCOUNTER — Encounter: Payer: Self-pay | Admitting: Obstetrics and Gynecology

## 2022-09-17 NOTE — Telephone Encounter (Signed)
Discussed that the swab was negative for yeast, but it was okay that she took the diflucan. Patient reports she is having less pain and no discharge and has been using the estrogen cream. She reports improvement in her symptoms overall and reports no bleeding.   Encouraged her to call if she has any more symptoms or feels she needs to be re-evaluated. Patient reports understanding.

## 2022-09-24 ENCOUNTER — Encounter: Payer: Self-pay | Admitting: *Deleted

## 2022-09-24 ENCOUNTER — Ambulatory Visit: Payer: Managed Care, Other (non HMO) | Admitting: Obstetrics and Gynecology

## 2022-09-24 ENCOUNTER — Encounter: Payer: Self-pay | Admitting: Obstetrics and Gynecology

## 2022-09-24 VITALS — BP 132/85 | HR 103

## 2022-09-24 DIAGNOSIS — Z09 Encounter for follow-up examination after completed treatment for conditions other than malignant neoplasm: Secondary | ICD-10-CM

## 2022-09-24 NOTE — Patient Instructions (Signed)
Continue estrogen cream for the next week then switch to twice weekly.

## 2022-09-24 NOTE — Progress Notes (Signed)
[facility] Return Visit  SUBJECTIVE  History of Present Illness: Makayla [name] is a 62 y.o. female seen for concerns related to a suture sporation. Plan at last visit was use estrogen cream nightly to promote tissue healing.   Patient reports she felt on Sunday a pop at her suture area and felt some pain and saw a few drops of blood and since that time it has been tender. She reports it is not painful, just tender. Also endorses some left sided buttock region pain.    Past Medical History: Patient  has a past medical history of Allergy, Anxiety, Bursitis, trochanteric, Dental crowns present, Diverticulosis, GERD (gastroesophageal reflux disease), Hyperlipidemia, Hypertension, Mass of breast, left (01/2012), Obesity, PONV (postoperative nausea and vomiting), and Pre-diabetes.   Past Surgical History: She  has a past surgical history that includes Tubal ligation; Robotic assisted laparoscopic sacrocolpopexy (06/14/2011); Rectocele repair (06/14/2011); Abdominal hysterectomy; Breast surgery (01/10/2012); Cholecystectomy (N/A, 02/10/2016); Breast excisional biopsy (Left, 2013); Colonoscopy (04/06/2010); Breast biopsy (Right, 08/04/2021); and Anterior and posterior repair with sacrospinous fixation (N/A, 08/20/2022).   Medications: She has a current medication list which includes the following prescription(s): acetaminophen, amlodipine, b complex vitamins, esomeprazole, estradiol, ibuprofen, ibuprofen, polyethylene glycol powder, sertraline, vitamin d (cholecalciferol), fluticasone, glycerin adult, hydrocortisone, hydroxyzine, ketoconazole, oxycodone, and vitamin e.   Allergies: Patient is allergic to buspar [buspirone].   Social History: Patient  reports that she has never smoked. She has been exposed to tobacco smoke. She has never used smokeless tobacco. She reports that she does not drink alcohol and does not use drugs.      OBJECTIVE     Physical Exam: Vitals:   09/24/22  1247  BP: 132/85  Pulse: (!) 103   Gen: No apparent distress, A&O x 3.  Detailed Urogynecologic Evaluation:  Vaginal tissues are much improved from last visit. Erythema is markedly decreased and there is no thick white discharge. On the left side of the introitus there is a stitch edge that is long and visible but there is no bleeding and no sign of dehiscence of the vaginal wall. No abscess or other sings of infection     ASSESSMENT AND PLAN   Makayla [name] is a 62 y.o. with:  1. Postop check    Patient was concerned with her suture popping and we discussed that as the sutures heal and dissolve this can happen. Advised her to continue to use vaginal estrogen cream for tissue healing. No need for a re-enforcement of suture at this time as there is no noted open areas at the suture line.   Patient to see Dr. Wannetta Sender next week for routine 6 week post op check.

## 2022-09-27 ENCOUNTER — Ambulatory Visit (INDEPENDENT_AMBULATORY_CARE_PROVIDER_SITE_OTHER): Payer: Managed Care, Other (non HMO) | Admitting: Family Medicine

## 2022-10-02 ENCOUNTER — Encounter: Payer: Self-pay | Admitting: Obstetrics and Gynecology

## 2022-10-02 ENCOUNTER — Ambulatory Visit (INDEPENDENT_AMBULATORY_CARE_PROVIDER_SITE_OTHER): Payer: Managed Care, Other (non HMO) | Admitting: Obstetrics and Gynecology

## 2022-10-02 VITALS — BP 134/75 | HR 101

## 2022-10-02 DIAGNOSIS — Z9889 Other specified postprocedural states: Secondary | ICD-10-CM

## 2022-10-02 NOTE — Progress Notes (Signed)
[facility]  Date of Visit: 10/02/2022  History of Present Illness: Ms. Antoun is a 62 y.o. female scheduled today for a post-operative visit.   Surgery: s/p Posterior repair, enterocele repair, perineorrhaphy, sacrospinous ligament fixation on 08/20/22  She passed her postoperative void trial.   Postoperative course has been uncomplicated.   Today she reports she has some irritation on the left side of the vagina. Sometimes notices some pink tinge but has minimal discharge.  UTI in the last 6 weeks? No  Pain? No  She has returned to her normal activity (except for postop restrictions) Vaginal bulge? No  Stress incontinence: No  Urgency/frequency: Yes - this was present before surgery, not as bothersome Urge incontinence: No  Voiding dysfunction: No  Bowel issues: No - taking miralax daily and trying her best not to strain. Has some pressure down below.   Subjective Success: Do you usually have a bulge or something falling out that you can see or feel in the vaginal area? No  Retreatment Success: Any retreatment with surgery or pessary for any compartment? No    Medications: She has a current medication list which includes the following prescription(s): acetaminophen, amlodipine, b complex vitamins, esomeprazole, estradiol, ibuprofen, polyethylene glycol powder, sertraline, and vitamin d (cholecalciferol).   Allergies: Patient is allergic to buspar [buspirone].   Physical Exam: BP 134/75 (BP Location: Left Arm, Patient Position: Sitting)   Pulse (!) 101   SpO2 99%    Pelvic Examination: Vagina: Incisions healing well, small separation at introitus which is tender to palpation. Sutures are present at the apex and there is not granulation tissue.  No apical tenderness. No pelvic masses.   POP-Q: POP-Q  -2.5                                            Aa   -2.5                                           Ba  -8                                              C   3                                             Gh  5.5                                            Pb  8                                            tvl   -3  Ap  -3                                            Bp                                                 D     ---------------------------------------------------------  Assessment and Plan:  1. Post-operative state     - She was asking about pelvic PT to help strengthen pelvic floor and prevent recurrence. Plan for referral to pelvic PT at next visit once we confirm that every thing is healed.  - Overall healing well but has some discomfort at the introitus where there is a small separation. Will check on that area again in a month.  - Can resume regular activity including exercise. Continue with pelvic rest for another month.  - Discussed avoidance of heavy lifting and straining long term to reduce the risk of recurrence.   Return 1 month  Jaquita Folds, MD

## 2022-10-24 ENCOUNTER — Ambulatory Visit (INDEPENDENT_AMBULATORY_CARE_PROVIDER_SITE_OTHER): Payer: Managed Care, Other (non HMO) | Admitting: Family Medicine

## 2022-10-24 ENCOUNTER — Encounter (INDEPENDENT_AMBULATORY_CARE_PROVIDER_SITE_OTHER): Payer: Self-pay | Admitting: Family Medicine

## 2022-10-24 VITALS — BP 134/85 | HR 83 | Temp 98.3°F | Ht 64.0 in | Wt 220.0 lb

## 2022-10-24 DIAGNOSIS — Z1331 Encounter for screening for depression: Secondary | ICD-10-CM

## 2022-10-24 DIAGNOSIS — E559 Vitamin D deficiency, unspecified: Secondary | ICD-10-CM | POA: Diagnosis not present

## 2022-10-24 DIAGNOSIS — R0602 Shortness of breath: Secondary | ICD-10-CM | POA: Insufficient documentation

## 2022-10-24 DIAGNOSIS — Z6837 Body mass index (BMI) 37.0-37.9, adult: Secondary | ICD-10-CM

## 2022-10-24 DIAGNOSIS — F411 Generalized anxiety disorder: Secondary | ICD-10-CM | POA: Insufficient documentation

## 2022-10-24 DIAGNOSIS — R5383 Other fatigue: Secondary | ICD-10-CM | POA: Insufficient documentation

## 2022-10-24 DIAGNOSIS — E669 Obesity, unspecified: Secondary | ICD-10-CM

## 2022-10-24 DIAGNOSIS — I10 Essential (primary) hypertension: Secondary | ICD-10-CM

## 2022-10-24 DIAGNOSIS — R7303 Prediabetes: Secondary | ICD-10-CM | POA: Insufficient documentation

## 2022-10-24 DIAGNOSIS — E7849 Other hyperlipidemia: Secondary | ICD-10-CM

## 2022-10-25 LAB — LIPID PANEL
Chol/HDL Ratio: 4.1 ratio (ref 0.0–4.4)
Cholesterol, Total: 227 mg/dL — ABNORMAL HIGH (ref 100–199)
HDL: 56 mg/dL (ref >39)
LDL Chol Calc (NIH): 146 mg/dL — ABNORMAL HIGH (ref 0–99)
Triglycerides: 139 mg/dL (ref 0–149)
VLDL Cholesterol Cal: 25 mg/dL (ref 5–40)

## 2022-10-25 LAB — COMPREHENSIVE METABOLIC PANEL
ALT: 15 IU/L (ref 0–32)
AST: 15 IU/L (ref 0–40)
Albumin/Globulin Ratio: 1.6 (ref 1.2–2.2)
Albumin: 4.2 g/dL (ref 3.9–4.9)
Alkaline Phosphatase: 123 IU/L — ABNORMAL HIGH (ref 44–121)
BUN/Creatinine Ratio: 14 (ref 12–28)
BUN: 12 mg/dL (ref 8–27)
Bilirubin Total: 0.3 mg/dL (ref 0.0–1.2)
CO2: 23 mmol/L (ref 20–29)
Calcium: 9.6 mg/dL (ref 8.7–10.3)
Chloride: 102 mmol/L (ref 96–106)
Creatinine, Ser: 0.83 mg/dL (ref 0.57–1.00)
Globulin, Total: 2.7 g/dL (ref 1.5–4.5)
Glucose: 104 mg/dL — ABNORMAL HIGH (ref 70–99)
Potassium: 4.5 mmol/L (ref 3.5–5.2)
Sodium: 143 mmol/L (ref 134–144)
Total Protein: 6.9 g/dL (ref 6.0–8.5)
eGFR: 80 mL/min/{1.73_m2} (ref >59)

## 2022-10-25 LAB — HEMOGLOBIN A1C
Est. average glucose Bld gHb Est-mCnc: 126 mg/dL
Hgb A1c MFr Bld: 6 % — ABNORMAL HIGH (ref 4.8–5.6)

## 2022-10-25 LAB — VITAMIN D 25 HYDROXY (VIT D DEFICIENCY, FRACTURES): Vit D, 25-Hydroxy: 56.4 ng/mL (ref 30.0–100.0)

## 2022-10-25 LAB — INSULIN, RANDOM: INSULIN: 16.1 u[IU]/mL (ref 2.6–24.9)

## 2022-10-25 LAB — VITAMIN B12: Vitamin B-12: 466 pg/mL (ref 232–1245)

## 2022-10-25 LAB — FOLATE: Folate: 16.4 ng/mL (ref >3.0)

## 2022-10-31 NOTE — Progress Notes (Signed)
Chief Complaint:   OBESITY Makayla [name] (MR# [id number]) is a 62 y.o. female who presents for evaluation and treatment of obesity and related comorbidities. Current BMI is Body mass index is 37.76 kg/m. Makayla [name] has been struggling with her weight for many years and has been unsuccessful in either losing weight, maintaining weight loss, or reaching her healthy weight goal.  Makayla [name] is retired and lives alone.  She walks 30 minutes 2 times a week and does not track her steps.  She drinks sweet tea and regular soda.  Makayla [name] is currently in the action stage of change and ready to dedicate time achieving and maintaining a healthier weight. Becca is interested in becoming our patient and working on intensive lifestyle modifications including (but not limited to) diet and exercise for weight loss.  Reda's habits were reviewed today and are as follows: Her family eats meals together, she thinks her family will eat healthier with her, her desired weight loss is 60 lbs, she started gaining weight 1998, her heaviest weight ever was 221 lbs pounds, she is a picky eater and doesn't like to eat healthier foods, she has significant food cravings issues, she snacks frequently in the evenings, she wakes up frequently in the middle of the night to eat, she skips meals frequently, she is frequently drinking liquids with calories, she frequently makes poor food choices, she frequently eats larger portions than normal, and she struggles with emotional eating.  Depression Screen Makayla [name]'s Food and Mood (modified PHQ-9) score was 6.  Subjective:   1. Other fatigue Makayla [name] admits to daytime somnolence and admits to waking up still tired. Patient has a history of symptoms of daytime fatigue, morning fatigue, and morning headache. Makayla [name] generally gets 7 or 8 hours of sleep per night, and states that she has nightime awakenings and generally restful sleep. Snoring is present. Apneic episodes are present. Epworth Sleepiness  Score is 7.  EKG, heart rate 93 bpm.  Incomplete RBBB, cardiology notes 10/09/2022 reviewed.  2. SOBOE (shortness of breath on exertion) Makayla [name] notes increasing shortness of breath with exercising and seems to be worsening over time with weight gain. She notes getting out of breath sooner with activity than she used to. This has not gotten worse recently. Makayla [name] denies shortness of breath at rest or orthopnea.  3. Vitamin D deficiency Patient is taking vitamin D 5000 IU daily.  Energy level is fair.  4. Essential hypertension Patient's blood pressure is well-controlled on amlodipine 2.5 mg daily.  Patient has a home blood pressure cuff and tends to run 110s/70s.  5. GAD (generalized anxiety disorder) Patient is on sertraline 25 mg daily.  Bariatric PHQ-9:6,  patient reports less sleep at night.  6. Pre-diabetes Patient last A1c was 6.3 on 06/08/2022.  Patient denies sugar cravings.  Patient has a positive family history of T2DM, mother.  Patient denies gestational diabetes mellitus.  7. Other hyperlipidemia FLP reviewed from 06/08/2022.  Patient would prefer to avoid statins.  Triglycerides 156, LDL 118.  Patient has been limiting red meat and fried foods. The 10-year ASCVD risk score (Arnett DK, et al., 2019) is: 6.4%   Values used to calculate the score:     Age: 18 years     Sex: Female     Is Non-Hispanic African American: No     Diabetic: No     Tobacco smoker: No     Systolic Blood Pressure: Q000111Q mmHg     Is BP treated: Yes  HDL Cholesterol: 56 mg/dL     Total Cholesterol: 227 mg/dL   Assessment/Plan:   1. Other fatigue Makayla [name] does feel that her weight is causing her energy to be lower than it should be. Fatigue may be related to obesity, depression or many other causes. Labs will be ordered, and in the meanwhile, Makayla [name] will focus on self care including making healthy food choices, increasing physical activity and focusing on stress reduction.  Update labs today.  - EKG  12-Lead - Insulin, random - Hemoglobin A1c - Folate - Comprehensive metabolic panel - Vitamin 123456  2. SOBOE (shortness of breath on exertion) Makayla [name] does feel that she gets out of breath more easily that she used to when she exercises. Makayla [name]'s shortness of breath appears to be obesity related and exercise induced. She has agreed to work on weight loss and gradually increase exercise to treat her exercise induced shortness of breath. Will continue to monitor closely.  3. Vitamin D deficiency Check labs today.  - VITAMIN D 25 Hydroxy (Vit-D Deficiency, Fractures)  4. Essential hypertension Continue current blood pressure medication.  Look for blood pressure improvements with weight loss.  Check labs today.  - Comprehensive metabolic panel  5. GAD (generalized anxiety disorder) Continue sertraline 25 mg nightly.  6. Pre-diabetes Check labs today.  Begin prescribed meal plan.  - Insulin, random - Hemoglobin A1c  7. Other hyperlipidemia Begin prescribed meal plan.  Check labs today.  - Lipid panel  8. Depression screen Makayla [name] had a positive depression screening. Depression is commonly associated with obesity and often results in emotional eating behaviors. We will monitor this closely and work on CBT to help improve the non-hunger eating patterns. Referral to Psychology may be required if no improvement is seen as she continues in our clinic.  9. Generalized obesity  10. Obesity,current BMI 37.8 Makayla [name] is currently in the action stage of change and her goal is to continue with weight loss efforts. I recommend Makayla [name] begin the structured treatment plan as follows:  She has agreed to the Category 2 Plan.  1.  100-calorie snack list given. 2.  Swap out lunch on plan for protein shake and fruit.  Exercise goals: All adults should avoid inactivity. Some physical activity is better than none, and adults who participate in any amount of physical activity gain some health benefits.    Behavioral modification strategies: increasing lean protein intake, increasing vegetables, increasing water intake, decreasing liquid calories, decreasing eating out, no skipping meals, meal planning and cooking strategies, keeping healthy foods in the home, better snacking choices, avoiding temptations, and planning for success.  She was informed of the importance of frequent follow-up visits to maximize her success with intensive lifestyle modifications for her multiple health conditions. She was informed we would discuss her lab results at her next visit unless there is a critical issue that needs to be addressed sooner. Makayla [name] agreed to keep her next visit at the agreed upon time to discuss these results.  Objective:   Blood pressure 134/85, pulse 83, temperature 98.3 F (36.8 C), height 5\' 4"  (1.626 m), weight 220 lb (99.8 kg), SpO2 97 %. Body mass index is 37.76 kg/m.  EKG: Normal sinus rhythm, rate 93 bpm.  Indirect Calorimeter completed today shows a VO2 of 220 and a REE of 1512.  Her calculated basal metabolic rate is 123456 thus her basal metabolic rate is worse than expected.  General: Cooperative, alert, well developed, in no acute distress. HEENT: Conjunctivae and lids unremarkable. Cardiovascular:  Regular rhythm.  Lungs: Normal work of breathing. Neurologic: No focal deficits.   Lab Results  Component Value Date   CREATININE 0.83 10/24/2022   BUN 12 10/24/2022   NA 143 10/24/2022   K 4.5 10/24/2022   CL 102 10/24/2022   CO2 23 10/24/2022   Lab Results  Component Value Date   ALT 15 10/24/2022   AST 15 10/24/2022   ALKPHOS 123 (H) 10/24/2022   BILITOT 0.3 10/24/2022   Lab Results  Component Value Date   HGBA1C 6.0 (H) 10/24/2022   HGBA1C 6.3 06/08/2022   HGBA1C 5.9 05/02/2020   HGBA1C 5.7 04/16/2019   HGBA1C 5.5 06/14/2017   Lab Results  Component Value Date   INSULIN 16.1 10/24/2022   Lab Results  Component Value Date   TSH 2.22 06/08/2022   Lab  Results  Component Value Date   CHOL 227 (H) 10/24/2022   HDL 56 10/24/2022   LDLCALC 146 (H) 10/24/2022   TRIG 139 10/24/2022   CHOLHDL 4.1 10/24/2022   Lab Results  Component Value Date   WBC 8.9 06/08/2022   HGB 13.5 06/08/2022   HCT 41.4 06/08/2022   MCV 80.3 06/08/2022   PLT 154.0 06/08/2022   No results found for: "IRON", "TIBC", "FERRITIN"  Attestation Statements:   Reviewed by clinician on day of visit: allergies, medications, problem list, medical history, surgical history, family history, social history, and previous encounter notes.  Time spent on visit including pre-visit chart review and post-visit charting and care was 40 minutes.   I, Malcolm Metro, am acting as Energy manager for Seymour Bars, DO.  I have reviewed the above documentation for accuracy and completeness, and I agree with the above. Seymour Bars DO

## 2022-11-05 NOTE — Progress Notes (Unsigned)
[facility]  Date of Visit: 11/06/2022  History of Present Illness: Ms. Windholz is a 62 y.o. female scheduled today for a post-operative visit.   Surgery: s/p Posterior repair, enterocele repair, perineorrhaphy, sacrospinous ligament fixation on 08/20/22  She passed her postoperative void trial.   Postoperative course has been uncomplicated.   Today she reports she has some irritation on the left side of the vagina. Sometimes notices some pink tinge but has minimal discharge.  UTI in the last 6 weeks? No  Pain? No  She has returned to her normal activity (except for postop restrictions) Vaginal bulge? No  Stress incontinence: No  Urgency/frequency: Yes - this was present before surgery, not as bothersome Urge incontinence: No  Voiding dysfunction: No  Bowel issues: No - taking miralax daily and trying her best not to strain. Has some pressure down below.   Subjective Success: Do you usually have a bulge or something falling out that you can see or feel in the vaginal area? No  Retreatment Success: Any retreatment with surgery or pessary for any compartment? No    Medications: She has a current medication list which includes the following prescription(s): acetaminophen, amlodipine, b complex vitamins, esomeprazole, estradiol, ibuprofen, polyethylene glycol powder, sertraline, and vitamin d (cholecalciferol).   Allergies: Patient is allergic to buspar [buspirone].   Physical Exam: There were no vitals taken for this visit.   Pelvic Examination: Vagina: Incisions healing well, small separation at introitus which is tender to palpation. Sutures are present at the apex and there is not granulation tissue.  No apical tenderness. No pelvic masses.   POP-Q: POP-Q                                               Aa                                               Ba                                                 C                                                Gh                                                Pb                                               tvl  Ap                                               Bp                                                 D     ---------------------------------------------------------  Assessment and Plan:  No diagnosis found.   - She was asking about pelvic PT to help strengthen pelvic floor and prevent recurrence. Plan for referral to pelvic PT at next visit once we confirm that every thing is healed.  - Overall healing well but has some discomfort at the introitus where there is a small separation. Will check on that area again in a month.  - Can resume regular activity including exercise. Continue with pelvic rest for another month.  - Discussed avoidance of heavy lifting and straining long term to reduce the risk of recurrence.   Return 1 month  Jaquita Folds, MD

## 2022-11-06 ENCOUNTER — Ambulatory Visit: Payer: Managed Care, Other (non HMO) | Admitting: Obstetrics and Gynecology

## 2022-11-06 ENCOUNTER — Telehealth: Payer: Self-pay | Admitting: *Deleted

## 2022-11-06 ENCOUNTER — Encounter: Payer: Self-pay | Admitting: Obstetrics and Gynecology

## 2022-11-06 VITALS — BP 119/81 | HR 91

## 2022-11-06 DIAGNOSIS — Z9889 Other specified postprocedural states: Secondary | ICD-10-CM

## 2022-11-06 DIAGNOSIS — N993 Prolapse of vaginal vault after hysterectomy: Secondary | ICD-10-CM

## 2022-11-06 MED ORDER — FLUTICASONE PROPIONATE 50 MCG/ACT NA SUSP: 2.0000 | Freq: Every day | NASAL | 6 refills | Status: DC

## 2022-11-06 NOTE — Telephone Encounter (Signed)
Received a fax from [facility] requesting a new Rx for flonase sent to them, looked at past refills and last filled in 2022, please advise   CPE was on 06/18/22

## 2022-11-06 NOTE — Telephone Encounter (Signed)
Sent!

## 2022-11-07 ENCOUNTER — Ambulatory Visit (INDEPENDENT_AMBULATORY_CARE_PROVIDER_SITE_OTHER): Payer: Managed Care, Other (non HMO) | Admitting: Family Medicine

## 2022-11-07 ENCOUNTER — Encounter (INDEPENDENT_AMBULATORY_CARE_PROVIDER_SITE_OTHER): Payer: Self-pay | Admitting: Family Medicine

## 2022-11-07 VITALS — BP 133/73 | HR 86 | Temp 97.6°F | Ht 64.0 in | Wt 218.0 lb

## 2022-11-07 DIAGNOSIS — E7849 Other hyperlipidemia: Secondary | ICD-10-CM

## 2022-11-07 DIAGNOSIS — R7303 Prediabetes: Secondary | ICD-10-CM

## 2022-11-07 DIAGNOSIS — K760 Fatty (change of) liver, not elsewhere classified: Secondary | ICD-10-CM | POA: Diagnosis not present

## 2022-11-07 DIAGNOSIS — Z6837 Body mass index (BMI) 37.0-37.9, adult: Secondary | ICD-10-CM

## 2022-11-07 MED ORDER — METFORMIN HCL 500 MG PO TABS: 500.0000 mg | ORAL_TABLET | Freq: Every day | ORAL | 0 refills | Status: DC

## 2022-11-07 NOTE — Assessment & Plan Note (Signed)
Reviewed labs from last visit including mild elevation in alkaline phosphatase level of 123 with normal AST and ALT level.  CT of the abdomen reviewed from 02/02/2022 confirming hepatic steatosis.  She reports it has been over 2 years since her last bone density test.  She denies right upper quadrant pain, nausea or abdominal bloating.  Plan: Repeat hepatic function panel in the next 3 months.  Begin work on weight reduction for the treatment of fatty liver disease

## 2022-11-07 NOTE — Assessment & Plan Note (Signed)
Lab Results  Component Value Date   HGBA1C 6.0 (H) 10/24/2022   Reviewed labs from last visit.  A1c is reduced to 6.0 from 6.3.  She has never taken metformin.  She has started her prescribed meal plan reducing intake of added sugar and starches.  She has room to increase her walking time.  Continue her prescribed meal plan.  Begin metformin 500 mg once daily with food.  Repeat chemistry panel and B12 in the next 2 to 3 months.

## 2022-11-07 NOTE — Assessment & Plan Note (Addendum)
The 10-year ASCVD risk score (Arnett DK, et al., 2019) is: 6.3%   Values used to calculate the score:     Age: 62 years     Sex: Female     Is Non-Hispanic African American: No     Diabetic: No     Tobacco smoker: No     Systolic Blood Pressure: Q000111Q mmHg     Is BP treated: Yes     HDL Cholesterol: 56 mg/dL     Total Cholesterol: 227 mg/dL  Lab Results  Component Value Date   CHOL 227 (H) 10/24/2022   HDL 56 10/24/2022   LDLCALC 146 (H) 10/24/2022   TRIG 139 10/24/2022   CHOLHDL 4.1 10/24/2022   Reviewed labs from last visit including an LDL of 146.  She has never taken cholesterol-lowering medication but has some fear of statins.  We are discussed her 10-year cardiovascular risk.  She is in the borderline low risk category.  Continue prescribed meal plan which is low in saturated fat and trans fat.  Plan increase exercise time and repeat lipid panel in the next 3 to 6 months.

## 2022-11-07 NOTE — Progress Notes (Signed)
Office: [phone]  /  Fax: [place]  Starting Date: 10/24/22  Starting Weight: 220 lb   No data recorded  Vitals Temp: 97.6 F (36.4 C) BP: 133/73 Pulse Rate: 86 SpO2: 96 %   Body Composition  Body Fat %: 45.8 % Fat Mass (lbs): 100.2 lbs Muscle Mass (lbs): 112.4 lbs Total Body Water (lbs): 77.2 lbs Visceral Fat Rating : 14   HPI  Chief Complaint: OBESITY  Makayla [name] is here to discuss her progress with her obesity treatment plan. She is on the the Category 2 Plan and states she is following her eating plan approximately 70 % of the time. She states she is exercising 10 minutes 3 times per week.  Interval History:  Since last office visit she is down 2 lb She is eating on plan most days She tried to drink a protein shake and fruit for lunch but it was making her nauseous She may be going back to work soon She denies over snacking at night She is keeping her snack calories in check She is reading nutrition labels She started a home workout video and feels good about her changes  Will change her lunch to the original meal plan lunch versus a protein shake and fruit  Pharmacotherapy: none  PHYSICAL EXAM:  Blood pressure 133/73, pulse 86, temperature 97.6 F (36.4 C), height 5\' 4"  (1.626 m), weight 218 lb (98.9 kg), SpO2 96 %. Body mass index is 37.42 kg/m.  General: She is overweight, cooperative, alert, well developed, and in no acute distress. PSYCH: Has normal mood, affect and thought process.   Lungs: Normal breathing effort, no conversational dyspnea.   ASSESSMENT AND PLAN  TREATMENT PLAN FOR OBESITY:  Recommended Dietary Goals  Makayla [name] is currently in the action stage of change. As such, her goal is to continue weight management plan. She has agreed to the Category 2 Plan.  Behavioral Intervention  We discussed the following Behavioral Modification Strategies today: increasing lean protein intake, avoiding  skipping meals, increasing water intake, work on managing stress, creating time for self-care and relaxation measures, continue to work on implementation of reduced calorie nutritional plan, and keeping healthy foods at home.  Additional resources provided today: NA  Recommended Physical Activity Goals  Makayla [name] has been advised to work up to 150 minutes of moderate intensity aerobic activity a week and strengthening exercises 2-3 times per week for cardiovascular health, weight loss maintenance and preservation of muscle mass.   She has agreed to Start aerobic activity with a goal of 150 minutes a week at moderate intensity.  Continue use of exercise app 3 days a week  Pharmacotherapy changes for the treatment of obesity: None  ASSOCIATED CONDITIONS ADDRESSED TODAY  Hepatic steatosis Assessment & Plan: Reviewed labs from last visit including mild elevation in alkaline phosphatase level of 123 with normal AST and ALT level.  CT of the abdomen reviewed from 02/02/2022 confirming hepatic steatosis.  She reports it has been over 2 years since her last bone density test.  She denies right upper quadrant pain, nausea or abdominal bloating.  Plan: Repeat hepatic function panel in the next 3 months.  Begin work on weight reduction for the treatment of fatty liver disease   Pre-diabetes Assessment & Plan: Lab Results  Component Value Date   HGBA1C 6.0 (H) 10/24/2022   Reviewed labs from last visit.  A1c is reduced to 6.0 from 6.3.  She has never taken metformin.  She has started her  prescribed meal plan reducing intake of added sugar and starches.  She has room to increase her walking time.  Continue her prescribed meal plan.  Begin metformin 500 mg once daily with food.  Repeat chemistry panel and B12 in the next 2 to 3 months.  Orders: -     metFORMIN HCl; Take 1 tablet (500 mg total) by mouth daily with breakfast.  Dispense: 30 tablet; Refill: 0  Other hyperlipidemia Assessment &  Plan: The 10-year ASCVD risk score (Arnett DK, et al., 2019) is: 6.3%   Values used to calculate the score:     Age: 62 years     Sex: Female     Is Non-Hispanic African American: No     Diabetic: No     Tobacco smoker: No     Systolic Blood Pressure: [id number] mmHg     Is BP treated: Yes     HDL Cholesterol: 56 mg/dL     Total Cholesterol: 227 mg/dL  Lab Results  Component Value Date   CHOL 227 (H) 10/24/2022   HDL 56 10/24/2022   LDLCALC 146 (H) 10/24/2022   TRIG 139 10/24/2022   CHOLHDL 4.1 10/24/2022   Reviewed labs from last visit including an LDL of 146.  She has never taken cholesterol-lowering medication but has some fear of statins.  We are discussed her 10-year cardiovascular risk.  She is in the borderline low risk category.  Continue prescribed meal plan which is low in saturated fat and trans fat.  Plan increase exercise time and repeat lipid panel in the next 3 to 6 months.    Morbid obesity Assessment & Plan: Improving Reviewed bioimpedance results She gained 1.4 pounds of muscle mass in the past 2 weeks and lost 2.8 pounds of body fat   BMI 37.0-37.9, adult      She was informed of the importance of frequent follow up visits to maximize her success with intensive lifestyle modifications for her multiple health conditions.   ATTESTASTION STATEMENTS:  Reviewed by clinician on day of visit: allergies, medications, problem list, medical history, surgical history, family history, social history, and previous encounter notes pertinent to obesity diagnosis.   I have personally spent 30 minutes total time today in preparation, patient care, nutritional counseling and documentation for this visit, including the following: review of clinical lab tests; review of medical tests/procedures/services.      Dell Ponto, DO DABFM, DABOM [facility] [address] [place], [place] [address] [phone]

## 2022-11-07 NOTE — Assessment & Plan Note (Signed)
Improving Reviewed bioimpedance results She gained 1.4 pounds of muscle mass in the past 2 weeks and lost 2.8 pounds of body fat

## 2022-11-22 ENCOUNTER — Encounter: Payer: Self-pay | Admitting: Obstetrics and Gynecology

## 2022-11-23 ENCOUNTER — Encounter: Payer: Self-pay | Admitting: Family Medicine

## 2022-11-23 ENCOUNTER — Ambulatory Visit (INDEPENDENT_AMBULATORY_CARE_PROVIDER_SITE_OTHER)
Admission: RE | Admit: 2022-11-23 | Discharge: 2022-11-23 | Disposition: A | Discharge status: Home / Self Care | Payer: Managed Care, Other (non HMO) | Source: Ambulatory Visit | Attending: Family Medicine | Admitting: Family Medicine

## 2022-11-23 ENCOUNTER — Ambulatory Visit (INDEPENDENT_AMBULATORY_CARE_PROVIDER_SITE_OTHER): Payer: Managed Care, Other (non HMO) | Admitting: Family Medicine

## 2022-11-23 VITALS — BP 126/74 | HR 95 | Temp 97.1°F | Ht 64.0 in | Wt 216.0 lb

## 2022-11-23 DIAGNOSIS — G8929 Other chronic pain: Secondary | ICD-10-CM | POA: Diagnosis not present

## 2022-11-23 DIAGNOSIS — R7303 Prediabetes: Secondary | ICD-10-CM

## 2022-11-23 DIAGNOSIS — M25561 Pain in right knee: Secondary | ICD-10-CM | POA: Insufficient documentation

## 2022-11-23 DIAGNOSIS — Z1159 Encounter for screening for other viral diseases: Secondary | ICD-10-CM

## 2022-11-23 DIAGNOSIS — Z114 Encounter for screening for human immunodeficiency virus [HIV]: Secondary | ICD-10-CM

## 2022-11-23 DIAGNOSIS — M25562 Pain in left knee: Secondary | ICD-10-CM

## 2022-11-23 DIAGNOSIS — M170 Bilateral primary osteoarthritis of knee: Secondary | ICD-10-CM

## 2022-11-23 NOTE — Progress Notes (Unsigned)
Subjective:    Patient ID: Makayla [name], female    DOB: [birth year], 62 y.o.   MRN: [id number]  HPI Pt presents with c/o bilateral knee pain   Wt Readings from Last 3 Encounters:  11/23/22 216 lb (98 kg)  11/07/22 218 lb (98.9 kg)  10/24/22 220 lb (99.8 kg)   37.08 kg/m  Vitals:   11/23/22 0814  BP: 126/74  Pulse: 95  Temp: (!) 97.1 F (36.2 C)  SpO2: 96%   Knee pain  Worse in the past 3 weeks  Has always had problems   Standing from sitting is problematic  R knee is anterior  L knee more medial and lateral   Sharp and dull L knee is more tight feeling when she bends it   L knee looks swollen  2017 xray L knee EXAM: LEFT KNEE - COMPLETE 4+ VIEW   COMPARISON:  None.   FINDINGS: Small osteochondral defect along the posterior aspect of patella cannot be excluded. No other focal bony abnormalities identified. Small knee joint effusion cannot be excluded .   IMPRESSION: 1. Small osteochondral defect along the posterior aspect of the patella cannot be excluded. 2. Small knee joint effusion cannot be excluded.  Has been trying to walk (indoors and outdoors) New shoes- they are good /her usual      Lab Results  Component Value Date   HGBA1C 6.0 (H) 10/24/2022   Has some insulin resistance  Wt clinic put her on metformin-=has not started yet   Patient Active Problem List   Diagnosis Date Noted   Bilateral knee pain 11/23/2022   Hepatic steatosis 11/07/2022   Other fatigue 10/24/2022   SOBOE (shortness of breath on exertion) 10/24/2022   GAD (generalized anxiety disorder) 10/24/2022   Pre-diabetes 10/24/2022   Other hyperlipidemia 10/24/2022   Depression screen 10/24/2022   Elevated glucose 07/18/2022   Hemorrhoids 06/07/2022   Rib pain on left side 06/13/2020   Colon cancer screening 05/06/2020   Insomnia, psychophysiological 03/14/2020   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   Retro-orbital pain of left eye 03/14/2020   Pain  head 02/25/2020   Pulsatile tinnitus of right ear 09/30/2019   Immunization reaction 09/08/2019   Grief reaction 10/03/2018   Elevated glucose level 01/16/2018   Generalized anxiety disorder 01/23/2017   Vitamin D deficiency 05/20/2015   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   Morbid obesity 10/25/2011   Routine general medical examination at a health care facility 10/16/2011   Hyperlipidemia LDL goal <130 12/04/2007   GERD 12/04/2007   Essential hypertension 12/04/2007   Past Medical History:  Diagnosis Date   Allergy    pollen   Anxiety    Bursitis, trochanteric    bi/lat    Dental crowns present    x 4   Diverticulosis    Fatty liver    GERD (gastroesophageal reflux disease)    Hyperlipidemia    no current med.   Hypertension    white coat syndrome,   Mass of breast, left 01/2012   Obesity    Palpitations    PONV (postoperative nausea and vomiting)    Pre-diabetes    Pre-diabetes    Vitamin D deficiency    Past Surgical History:  Procedure Laterality Date   ABDOMINAL HYSTERECTOMY     partial   ANTERIOR AND POSTERIOR REPAIR WITH SACROSPINOUS FIXATION N/A 08/20/2022   Procedure: POSTERIOR REPAIR WITH PERINEORRHAPHY AND WITH  SACROSPINOUS FIXATION;  Surgeon: Marguerita Beards,  MD;  Location: [facility];  Service: Gynecology;  Laterality: N/A;   BREAST BIOPSY Right 08/04/2021   BREAST EXCISIONAL BIOPSY Left 2013   BREAST SURGERY  01/10/2012   lumpectomy - left (excisional biopsy benign)   CHOLECYSTECTOMY N/A 02/10/2016   Procedure: LAPAROSCOPIC CHOLECYSTECTOMY WITH INTRAOPERATIVE CHOLANGIOGRAM;  Surgeon: Chevis Pretty III, MD;  Location: [facility];  Service: General;  Laterality: N/A;   COLONOSCOPY  04/06/2010   D Brodie, normal w/tics   RECTOCELE REPAIR  06/14/2011   cystocele repair, pt states Dr Jacquelyne Balint did not do rectocele and said she had to come back later.   ROBOTIC ASSISTED LAPAROSCOPIC SACROCOLPOPEXY  06/14/2011    Procedure: ROBOTIC ASSISTED LAPAROSCOPIC SACROCOLPOPEXY;  Surgeon: Crecencio Mc, MD;  Location: [facility];  Service: Urology;  Laterality: N/A;   TUBAL LIGATION     Social History   Tobacco Use   Smoking status: Never    Passive exposure: Past   Smokeless tobacco: Never  Vaping Use   Vaping Use: Never used  Substance Use Topics   Alcohol use: No    Alcohol/week: 0.0 standard drinks of alcohol   Drug use: No   Family History  Problem Relation Age of Onset   Hypertension Mother    Hyperlipidemia Mother    Diabetes Mother    Breast cancer Mother 72   Uterine cancer Mother    GER disease Father    Depression Sister    Heart disease Brother    Heart attack Maternal Grandmother    Hypertension Maternal Grandmother    Colon cancer Neg Hx    Colon polyps Neg Hx    Esophageal cancer Neg Hx    Rectal cancer Neg Hx    Stomach cancer Neg Hx    Allergies  Allergen Reactions   Buspar [Buspirone]     Made her more anxious   Current Outpatient Medications on File Prior to Visit  Medication Sig Dispense Refill   acetaminophen (TYLENOL) 500 MG tablet Take 1 tablet (500 mg total) by mouth every 6 (six) hours as needed (pain). 30 tablet 0   amLODipine (NORVASC) 2.5 MG tablet Take 1 tablet (2.5 mg total) by mouth daily. 90 tablet 3   b complex vitamins capsule Take 1 capsule by mouth daily.     esomeprazole (NEXIUM) 20 MG capsule Take 20 mg by mouth 2 (two) times daily before a meal.     estradiol (ESTRACE) 0.1 MG/GM vaginal cream Place 0.5 g vaginally 2 (two) times a week. Place 0.5g nightly for two weeks then twice a week after 30 g 11   fluticasone (FLONASE) 50 MCG/ACT nasal spray Place 2 sprays into both nostrils daily. 16 g 6   ibuprofen (ADVIL) 800 MG tablet TAKE 1 TABLET BY MOUTH EVERY 8 HOURS AS NEEDED FOR PAIN 90 tablet 1   sertraline (ZOLOFT) 25 MG tablet Take 1 tablet (25 mg total) by mouth daily. 90 tablet 3   Vitamin D, Cholecalciferol, 25 MCG (1000 UT) TABS Take 5,000 Units by  mouth daily.     metFORMIN (GLUCOPHAGE) 500 MG tablet Take 1 tablet (500 mg total) by mouth daily with breakfast. (Patient not taking: Reported on 11/23/2022) 30 tablet 0   No current facility-administered medications on file prior to visit.     Review of Systems  Constitutional:  Negative for activity change, appetite change, fatigue, fever and unexpected weight change.  HENT:  Negative for congestion, rhinorrhea, sore throat and trouble swallowing.   Eyes:  Negative for  pain, redness, itching and visual disturbance.  Respiratory:  Negative for cough, chest tightness, shortness of breath and wheezing.   Cardiovascular:  Negative for chest pain and palpitations.  Gastrointestinal:  Negative for abdominal pain, blood in stool, constipation, diarrhea and nausea.  Endocrine: Negative for cold intolerance, heat intolerance, polydipsia and polyuria.  Genitourinary:  Negative for difficulty urinating, dysuria, frequency and urgency.  Musculoskeletal:  Positive for arthralgias. Negative for joint swelling and myalgias.  Skin:  Negative for pallor and rash.  Neurological:  Negative for dizziness, tremors, weakness, numbness and headaches.  Hematological:  Negative for adenopathy. Does not bruise/bleed easily.  Psychiatric/Behavioral:  Negative for decreased concentration and dysphoric mood. The patient is not nervous/anxious.        Objective:   Physical Exam Constitutional:      General: She is not in acute distress.    Appearance: Normal appearance. She is obese. She is not ill-appearing or diaphoretic.  HENT:     Head: Normocephalic and atraumatic.  Eyes:     Conjunctiva/sclera: Conjunctivae normal.     Pupils: Pupils are equal, round, and reactive to light.  Neck:     Vascular: No carotid bruit.  Cardiovascular:     Rate and Rhythm: Normal rate and regular rhythm.     Heart sounds: Normal heart sounds.  Pulmonary:     Effort: Pulmonary effort is normal. No respiratory distress.      Breath sounds: No wheezing.  Musculoskeletal:     Cervical back: Neck supple.     Comments: Knee right  No swelling or effusion  No warmth to the touch  No crepitus  ROM:  Flex-full with discomfort Ext -full Mcmurray- some discomfort  Bounce test - nl   Stability: Anterior drawer-nl Lachman exam -nl  Tenderness - some medial   Gait -sloe   Knee left No swelling or effusion  No warmth to the touch  Mild crepitus ROM:  Flex limited due to pain Ext -nl Mcmurray-discomfort  Bounce test -nl  Stability: Anterior drawer-nl Lachman exam -nl  Tenderness medial joint line   Gait -favors RLE        Lymphadenopathy:     Cervical: No cervical adenopathy.  Skin:    General: Skin is warm and dry.     Findings: No bruising, erythema or rash.  Neurological:     Mental Status: She is alert.     Sensory: No sensory deficit.     Motor: No weakness.  Psychiatric:        Mood and Affect: Mood normal.           Assessment & Plan:   Problem List Items Addressed This Visit       Other   Bilateral knee pain - Primary    Chronic but worse in past 3 wek  L knee is worse- also with crepitus and limited rom and feeling of swelling  (no obv effusion today) Most likely has some patellar tracking issues High risk for OA as well Xrays ordered-pending rad review Disc pref activities that are low impact  Disc ice/elevation  Compression if helpful  Voltaren gel       Relevant Orders   DG Knee 4 Views W/Patella Left   DG Knee 4 Views W/Patella Right   Morbid obesity    [facility] Reviewed notes and labs today Motivated for better diet /exercise  Plans to start metformin for co morbidity of prediabetes  Pre-diabetes    Lab Results  Component Value Date   HGBA1C 6.0 (H) 10/24/2022  [facility]  Was pt metformin to help with this and wt loss Plans to schedule this

## 2022-11-23 NOTE — Patient Instructions (Addendum)
Over the counter voltaren gel - use up to 4 times daily  Ice for 10 minutes when you can   Lower impact exercise /strength training  Investigate your options   Add some strength training to your routine, this is important for bone and brain health and can reduce your risk of falls and help your body use insulin properly and regulate weight  Light weights, exercise bands , and internet videos are a good way to start  Yoga (chair or regular), machines , floor exercises or a gym with machines are also good options    Xray today   Plan to follow    Keep working on weight loss

## 2022-11-25 NOTE — Assessment & Plan Note (Signed)
Chronic but worse in past 3 wek  L knee is worse- also with crepitus and limited rom and feeling of swelling  (no obv effusion today) Most likely has some patellar tracking issues High risk for OA as well Xrays ordered-pending rad review Disc pref activities that are low impact  Disc ice/elevation  Compression if helpful  Voltaren gel

## 2022-11-25 NOTE — Assessment & Plan Note (Addendum)
[facility] Reviewed notes and labs today Motivated for better diet /exercise  Plans to start metformin for co morbidity of prediabetes

## 2022-11-25 NOTE — Assessment & Plan Note (Signed)
Lab Results  Component Value Date   HGBA1C 6.0 (H) 10/24/2022   [facility]  Was pt metformin to help with this and wt loss Plans to schedule this

## 2022-11-27 ENCOUNTER — Telehealth: Payer: Self-pay | Admitting: *Deleted

## 2022-11-27 NOTE — Telephone Encounter (Signed)
Left VM requesting pt to call the office back 

## 2022-11-27 NOTE — Telephone Encounter (Signed)
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

## 2022-11-28 ENCOUNTER — Encounter: Payer: Self-pay | Admitting: Family Medicine

## 2022-11-28 DIAGNOSIS — M1712 Unilateral primary osteoarthritis, left knee: Secondary | ICD-10-CM | POA: Insufficient documentation

## 2022-11-28 DIAGNOSIS — M179 Osteoarthritis of knee, unspecified: Secondary | ICD-10-CM | POA: Insufficient documentation

## 2022-11-28 NOTE — Addendum Note (Signed)
Addended by: Roxy Manns A on: 11/28/2022 03:32 PM   Modules accepted: Orders

## 2022-11-29 NOTE — Progress Notes (Signed)
[facility] Return Visit  SUBJECTIVE  History of Present Illness: Makayla [name] is a 62 y.o. female seen in follow-up for vaginal bulge. Plan at last visit was return as needed. She is feeling a bulge on the back wall.   Surgery: s/p Posterior repair, enterocele repair, perineorrhaphy, sacrospinous ligament fixation on 08/20/22   Is using Miralax daily for bowel movements.    Past Medical History: Patient  has a past medical history of Allergy, Anxiety, Bursitis, trochanteric, Dental crowns present, Diverticulosis, Fatty liver, GERD (gastroesophageal reflux disease), Hyperlipidemia, Hypertension, Mass of breast, left (01/2012), Obesity, Palpitations, PONV (postoperative nausea and vomiting), Pre-diabetes, Pre-diabetes, and Vitamin D deficiency.   Past Surgical History: She  has a past surgical history that includes Tubal ligation; Robotic assisted laparoscopic sacrocolpopexy (06/14/2011); Rectocele repair (06/14/2011); Abdominal hysterectomy; Breast surgery (01/10/2012); Cholecystectomy (N/A, 02/10/2016); Breast excisional biopsy (Left, 2013); Colonoscopy (04/06/2010); Breast biopsy (Right, 08/04/2021); and Anterior and posterior repair with sacrospinous fixation (N/A, 08/20/2022).   Medications: She has a current medication list which includes the following prescription(s): acetaminophen, amlodipine, b complex vitamins, esomeprazole, estradiol, fluticasone, ibuprofen, metformin, sertraline, and vitamin d (cholecalciferol).   Allergies: Patient is allergic to buspar [buspirone].   Social History: Patient  reports that she has never smoked. She has been exposed to tobacco smoke. She has never used smokeless tobacco. She reports that she does not drink alcohol and does not use drugs.      OBJECTIVE     Physical Exam: Vitals:   11/30/22 1115  BP: 122/71  Pulse: 78   Gen: No apparent distress, A&O x 3.  Detailed Urogynecologic Evaluation:    POP-Q  -3                                             Aa   -3                                           Ba  -10                                              C   4.5                                            Gh  6.5                                            Pb  10                                            tvl   -1  Ap  -1                                            Bp                                                 D        ASSESSMENT AND PLAN    Makayla [name] is a 62 y.o. with:  1. Prolapse of posterior vaginal wall    Mild prolapse reoccurrence. Patient is tearful with exam results. We discussed that the vaginal surgery she had was a 70-80% success rate. While the prolapse is not as significant as before, it has reoccurred some after healing (Previous baseline POP-Q showed Ap Bp at +3 and it is currently -1) Patient to continue to do estrogen cream, will plan for her to start pelvic floor PT, she has an appointment scheduled. Also encouraged her to get a squatty potty or something of that style to decrease her potential straining and to continue the miralax she is taking. We discussed that while heavy lifting is not recommended she does need to live her life and find a good balance.   Patient to return in September for follow up.

## 2022-11-29 NOTE — Telephone Encounter (Signed)
Pt viewed results via mychart and sent a response directly to PCP

## 2022-11-30 ENCOUNTER — Ambulatory Visit: Payer: Managed Care, Other (non HMO) | Admitting: Obstetrics and Gynecology

## 2022-11-30 ENCOUNTER — Encounter: Payer: Self-pay | Admitting: Obstetrics and Gynecology

## 2022-11-30 VITALS — BP 122/71 | HR 78

## 2022-11-30 DIAGNOSIS — N816 Rectocele: Secondary | ICD-10-CM

## 2022-11-30 NOTE — Patient Instructions (Addendum)
Continue using the Miralax for your bowel movements and consider getting a squatty potty to assist the bowel movements.   Hopefully physical therapy can assist in strengthening the pelvic floor.   I am hopeful with physical therapy it can decrease the laxity in the tissue.

## 2022-12-03 ENCOUNTER — Ambulatory Visit (INDEPENDENT_AMBULATORY_CARE_PROVIDER_SITE_OTHER): Payer: Managed Care, Other (non HMO) | Admitting: Family Medicine

## 2022-12-03 ENCOUNTER — Encounter (INDEPENDENT_AMBULATORY_CARE_PROVIDER_SITE_OTHER): Payer: Self-pay | Admitting: Family Medicine

## 2022-12-03 VITALS — BP 115/77 | HR 80 | Temp 97.8°F | Ht 64.0 in | Wt 211.0 lb

## 2022-12-03 DIAGNOSIS — R7303 Prediabetes: Secondary | ICD-10-CM

## 2022-12-03 DIAGNOSIS — E7849 Other hyperlipidemia: Secondary | ICD-10-CM | POA: Diagnosis not present

## 2022-12-03 DIAGNOSIS — M170 Bilateral primary osteoarthritis of knee: Secondary | ICD-10-CM | POA: Diagnosis not present

## 2022-12-03 DIAGNOSIS — Z6836 Body mass index (BMI) 36.0-36.9, adult: Secondary | ICD-10-CM

## 2022-12-03 MED ORDER — METFORMIN HCL 500 MG PO TABS: 500.0000 mg | ORAL_TABLET | Freq: Every day | ORAL | 0 refills | Status: DC

## 2022-12-03 NOTE — Assessment & Plan Note (Signed)
Lab Results  Component Value Date   HGBA1C 6.0 (H) 10/24/2022   Doing well on metformin 500 mg with breakfast daily with only occasional loose stools.  Doing well with a reduced calorie diet, low in sugar and refined carbs.  Has room for improvement with exercise.  Has lost 9 lb in 1 month.    Continue current prescribed diet. Continue metformin 500 mg - can move to dinner Recheck labs in June Plan to increase short walks throughout the day, check out local gyms for resistance training/ water exercise

## 2022-12-03 NOTE — Assessment & Plan Note (Signed)
Reviewed recent xray of both knees showing mild degenerative changes.  She has limited her walking due to pain in the L>R knee and is set up to see ortho.  She may benefit from PT.  She is using Tylenol prn.  Avoid resistance training exercises that cause knee pain like squats and lunges. She may benefit from working with a trainer to help her strengthen her muscles around her knees.

## 2022-12-03 NOTE — Progress Notes (Signed)
Office: [phone]  /  Fax: [phone]  WEIGHT SUMMARY AND BIOMETRICS  Starting Date: 10/24/22  Starting Weight: 220lb   Weight Lost Since Last Visit: 7lb   Vitals Temp: 97.8 F (36.6 C) BP: 115/77 Pulse Rate: 80 SpO2: 94 %   Body Composition  Body Fat %: 45.7 % Fat Mass (lbs): 96.8 lbs Muscle Mass (lbs): 109 lbs Total Body Water (lbs): 76 lbs Visceral Fat Rating : 14     HPI  Chief Complaint: OBESITY  Makayla [name] is here to discuss her progress with her obesity treatment plan. She is on the the Category 2 Plan and states she is following her eating plan approximately 80 % of the time. She states she is exercising 0 minutes 0 times per week.   Interval History:  Since last office visit she is down 7 lb She has a net weight loss of 9 lb in the past 3 mos She has less sugar cravings She is making better food choices and is being mindful Bilateral knee pain keeps her from walking much She is working part time at [employer] She is bringing a protein shake and fruit to lunch She is cooking more at home She started metformin 500 mg with breakfast   Pharmacotherapy: metformin  PHYSICAL EXAM:  Blood pressure 115/77, pulse 80, temperature 97.8 F (36.6 C), height 5\' 4"  (1.626 m), weight 211 lb (95.7 kg), SpO2 94 %. Body mass index is 36.22 kg/m.  General: She is overweight, cooperative, alert, well developed, and in no acute distress. PSYCH: Has normal mood, affect and thought process.   Lungs: Normal breathing effort, no conversational dyspnea.   ASSESSMENT AND PLAN  TREATMENT PLAN FOR OBESITY:  Recommended Dietary Goals  Makayla [name] is currently in the action stage of change. As such, her goal is to continue weight management plan. She has agreed to the Category 2 Plan. Add in a high protein snack daily to help prevent further muscle mass loss (15+ g of protein)  Behavioral Intervention  We discussed the following Behavioral Modification  Strategies today: increasing lean protein intake, decreasing simple carbohydrates , increasing vegetables, increasing lower glycemic fruits, increasing water intake, work on meal planning and preparation, work on managing stress, creating time for self-care and relaxation measures, continue to practice mindfulness when eating, planning for success, and better snacking choices.  Additional resources provided today: NA  Recommended Physical Activity Goals  Makayla [name] has been advised to work up to 150 minutes of moderate intensity aerobic activity a week and strengthening exercises 2-3 times per week for cardiovascular health, weight loss maintenance and preservation of muscle mass.   She has agreed to Think about ways to increase physical activity  Pharmacotherapy changes for the treatment of obesity:   ASSOCIATED CONDITIONS ADDRESSED TODAY  Pre-diabetes Assessment & Plan: Lab Results  Component Value Date   HGBA1C 6.0 (H) 10/24/2022   Doing well on metformin 500 mg with breakfast daily with only occasional loose stools.  Doing well with a reduced calorie diet, low in sugar and refined carbs.  Has room for improvement with exercise.  Has lost 9 lb in 1 month.    Continue current prescribed diet. Continue metformin 500 mg - can move to dinner Recheck labs in June Plan to increase short walks throughout the day, check out local gyms for resistance training/ water exercise  Orders: -     metFORMIN HCl; Take 1 tablet (500 mg total) by mouth daily with breakfast.  Dispense: 30 tablet;  Refill: 0  Morbid obesity (HCC)  BMI 36.0-36.9,adult  Osteoarthritis of both knees, unspecified osteoarthritis type Assessment & Plan: Reviewed recent xray of both knees showing mild degenerative changes.  She has limited her walking due to pain in the L>R knee and is set up to see ortho.  She may benefit from PT.  She is using Tylenol prn.  Avoid resistance training exercises that cause knee pain like squats  and lunges. She may benefit from working with a trainer to help her strengthen her muscles around her knees.   Other hyperlipidemia Assessment & Plan: Lab Results  Component Value Date   CHOL 227 (H) 10/24/2022   HDL 56 10/24/2022   LDLCALC 146 (H) 10/24/2022   TRIG 139 10/24/2022   CHOLHDL 4.1 10/24/2022   She has been working on a low saturated fat diet, weight reduction and has plans to ramp up exercise time. Recheck FLP this summer Currently not on any lipid lowering medications.  The 10-year ASCVD risk score (Arnett DK, et al., 2019) is: 4.8%   Values used to calculate the score:     Age: 62 years     Sex: Female     Is Non-Hispanic African American: No     Diabetic: No     Tobacco smoker: No     Systolic Blood Pressure: 115 mmHg     Is BP treated: Yes     HDL Cholesterol: 56 mg/dL     Total Cholesterol: 227 mg/dL        She was informed of the importance of frequent follow up visits to maximize her success with intensive lifestyle modifications for her multiple health conditions.   ATTESTASTION STATEMENTS:  Reviewed by clinician on day of visit: allergies, medications, problem list, medical history, surgical history, family history, social history, and previous encounter notes pertinent to obesity diagnosis.   I have personally spent 30 minutes total time today in preparation, patient care, nutritional counseling and documentation for this visit, including the following: review of clinical lab tests; review of medical tests/procedures/services.      Makayla Brink, DO DABFM, DABOM [facility] [address] [place], [place] [address] [phone]

## 2022-12-03 NOTE — Assessment & Plan Note (Signed)
Lab Results  Component Value Date   CHOL 227 (H) 10/24/2022   HDL 56 10/24/2022   LDLCALC 146 (H) 10/24/2022   TRIG 139 10/24/2022   CHOLHDL 4.1 10/24/2022   She has been working on a low saturated fat diet, weight reduction and has plans to ramp up exercise time. Recheck FLP this summer Currently not on any lipid lowering medications.  The 10-year ASCVD risk score (Arnett DK, et al., 2019) is: 4.8%   Values used to calculate the score:     Age: 62 years     Sex: Female     Is Non-Hispanic African American: No     Diabetic: No     Tobacco smoker: No     Systolic Blood Pressure: 115 mmHg     Is BP treated: Yes     HDL Cholesterol: 56 mg/dL     Total Cholesterol: 227 mg/dL

## 2022-12-07 ENCOUNTER — Ambulatory Visit: Payer: Managed Care, Other (non HMO) | Admitting: Orthopaedic Surgery

## 2022-12-07 DIAGNOSIS — M1711 Unilateral primary osteoarthritis, right knee: Secondary | ICD-10-CM | POA: Diagnosis not present

## 2022-12-07 DIAGNOSIS — M1712 Unilateral primary osteoarthritis, left knee: Secondary | ICD-10-CM | POA: Diagnosis not present

## 2022-12-07 DIAGNOSIS — M170 Bilateral primary osteoarthritis of knee: Secondary | ICD-10-CM

## 2022-12-07 MED ORDER — LIDOCAINE HCL 1 % IJ SOLN
2.0000 mL | INTRAMUSCULAR | Status: AC | PRN
  Administered 2022-12-07: 2 mL

## 2022-12-07 MED ORDER — METHYLPREDNISOLONE ACETATE 40 MG/ML IJ SUSP
40.0000 mg | INTRAMUSCULAR | Status: AC | PRN
  Administered 2022-12-07: 40 mg via INTRA_ARTICULAR

## 2022-12-07 MED ORDER — BUPIVACAINE HCL 0.5 % IJ SOLN
2.0000 mL | INTRAMUSCULAR | Status: AC | PRN
  Administered 2022-12-07: 2 mL via INTRA_ARTICULAR

## 2022-12-07 NOTE — Progress Notes (Signed)
Office Visit Note   Patient: Makayla [name]           Date of Birth: [birth year]           MRN: [id number] Visit Date: 12/07/2022              Requested by: Tower, Audrie Gallus, MD [address] [place],  [place] [address] PCP: Judy Pimple, MD   Assessment & Plan: Visit Diagnoses:  1. Primary osteoarthritis of left knee   2. Primary osteoarthritis of right knee     Plan: Impression is bilateral knee osteoarthritis flareup.  Today, we discussed various treatment options to include prescription oral NSAIDs versus cortisone injection.  She would like to proceed with cortisone injections today.  She will follow-up with [place] as needed.  Follow-Up Instructions: Return if symptoms worsen or fail to improve.   Orders:  No orders of the defined types were placed in this encounter.  No orders of the defined types were placed in this encounter.     Procedures: Large Joint Inj: bilateral knee on 12/07/2022 11:25 AM Indications: pain Details: 22 G needle  Arthrogram: No  Medications (Right): 2 mL lidocaine 1 %; 2 mL bupivacaine 0.5 %; 40 mg methylPREDNISolone acetate 40 MG/ML Medications (Left): 2 mL lidocaine 1 %; 2 mL bupivacaine 0.5 %; 40 mg methylPREDNISolone acetate 40 MG/ML Outcome: tolerated well, no immediate complications Patient was prepped and draped in the usual sterile fashion.       Clinical Data: No additional findings.   Subjective: Chief Complaint  Patient presents with   Right Knee - Pain   Left Knee - Pain    HPI patient is a pleasant 62 year old female who comes in today with bilateral knee pain left greater than right.  This is been ongoing for the past month.  The pain she has is to the medial aspect worse going from a seated to standing position as well as stepping up on a curb which actually bothers the posterior lateral knee.  She has associated stiffness and weakness.  She does get some relief with rest as well as topical Voltaren.  No previous  cortisone injection to either knee.  She does note an injury back in 2017 when she stepped up with the left knee and felt a pop to the posterior lateral knee.  Review of Systems as detailed in HPI.  All others reviewed and are negative.   Objective: Vital Signs: There were no vitals taken for this visit.  Physical Exam well-developed well-nourished female no acute distress.  Alert and oriented x 3.  Ortho Exam bilateral knee exam: Range of motion 0 to 125 degrees.  Medial and lateral joint line tenderness.  Moderate patellofemoral crepitus.  Ligaments are stable.  No tenderness to the distal hamstring.  No pain or weakness with resisted knee flexion or extension.  She is neurovascularly intact distally.  Specialty Comments:  No specialty comments available.  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   Retro-orbital pain of left eye 03/14/2020   Pain head 02/25/2020   Pulsatile tinnitus of right ear 09/30/2019   Immunization reaction 09/08/2019   Grief reaction 10/03/2018   Elevated glucose level 01/16/2018   Generalized anxiety disorder 01/23/2017   Vitamin D deficiency 05/20/2015   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   Morbid obesity (HCC) 10/25/2011   Routine general medical examination at a health care facility 10/16/2011   Hyperlipidemia LDL goal <130 12/04/2007    GERD 12/04/2007   Essential hypertension 12/04/2007   Past Medical History:  Diagnosis Date   Allergy    pollen   Anxiety    Bursitis, trochanteric    bi/lat    Dental crowns present    x 4   Diverticulosis    Fatty liver    GERD (gastroesophageal reflux disease)    Hyperlipidemia    no current med.   Hypertension    white coat syndrome,   Mass of breast, left 01/2012   Obesity    Palpitations    PONV (postoperative nausea and vomiting)    Pre-diabetes    Pre-diabetes    Vitamin D deficiency     Family History  Problem Relation Age of Onset   Hypertension Mother    Hyperlipidemia Mother    Diabetes Mother    Breast cancer Mother 46   Uterine cancer Mother    GER disease Father    Depression Sister    Heart disease Brother    Heart attack Maternal Grandmother    Hypertension Maternal Grandmother    Colon cancer Neg Hx    Colon polyps Neg Hx    Esophageal cancer Neg Hx    Rectal cancer Neg Hx    Stomach cancer Neg Hx     Past Surgical History:  Procedure Laterality Date   ABDOMINAL HYSTERECTOMY     partial   ANTERIOR AND POSTERIOR REPAIR WITH SACROSPINOUS FIXATION N/A 08/20/2022   Procedure: POSTERIOR REPAIR WITH PERINEORRHAPHY AND WITH  SACROSPINOUS FIXATION;  Surgeon: Marguerita Beards, MD;  Location: [facility];  Service: Gynecology;  Laterality: N/A;   BREAST BIOPSY Right 08/04/2021   BREAST EXCISIONAL BIOPSY Left 2013   BREAST SURGERY  01/10/2012   lumpectomy - left (excisional biopsy benign)   CHOLECYSTECTOMY N/A 02/10/2016   Procedure: LAPAROSCOPIC CHOLECYSTECTOMY WITH INTRAOPERATIVE CHOLANGIOGRAM;  Surgeon: Chevis Pretty III, MD;  Location: [facility];  Service: General;  Laterality: N/A;   COLONOSCOPY  04/06/2010   D Brodie, normal w/tics   RECTOCELE REPAIR  06/14/2011   cystocele repair, pt states Dr Jacquelyne Balint did not do rectocele and said she had to come back later.   ROBOTIC ASSISTED LAPAROSCOPIC SACROCOLPOPEXY  06/14/2011    Procedure: ROBOTIC ASSISTED LAPAROSCOPIC SACROCOLPOPEXY;  Surgeon: Crecencio Mc, MD;  Location: [facility];  Service: Urology;  Laterality: N/A;   TUBAL LIGATION     Social History   Occupational History   Occupation: RN-retired  Tobacco Use   Smoking status: Never    Passive exposure: Past   Smokeless tobacco: Never  Vaping Use   Vaping Use: Never used  Substance and Sexual Activity   Alcohol use: No    Alcohol/week: 0.0 standard drinks of alcohol   Drug use: No   Sexual activity: Not Currently    Birth control/protection: Post-menopausal    Comment: has used estradiol patch 0.025  in the past, has stopped

## 2022-12-27 ENCOUNTER — Ambulatory Visit (INDEPENDENT_AMBULATORY_CARE_PROVIDER_SITE_OTHER): Payer: Managed Care, Other (non HMO) | Admitting: Family Medicine

## 2022-12-27 ENCOUNTER — Encounter (INDEPENDENT_AMBULATORY_CARE_PROVIDER_SITE_OTHER): Payer: Self-pay | Admitting: Family Medicine

## 2022-12-27 VITALS — BP 120/80 | HR 77 | Temp 97.7°F | Ht 64.0 in | Wt 206.0 lb

## 2022-12-27 DIAGNOSIS — Z6835 Body mass index (BMI) 35.0-35.9, adult: Secondary | ICD-10-CM

## 2022-12-27 DIAGNOSIS — R7303 Prediabetes: Secondary | ICD-10-CM

## 2022-12-27 DIAGNOSIS — E7849 Other hyperlipidemia: Secondary | ICD-10-CM | POA: Diagnosis not present

## 2022-12-27 DIAGNOSIS — M170 Bilateral primary osteoarthritis of knee: Secondary | ICD-10-CM

## 2022-12-27 MED ORDER — METFORMIN HCL 500 MG PO TABS: 500.0000 mg | ORAL_TABLET | Freq: Every day | ORAL | 0 refills | Status: DC

## 2022-12-27 NOTE — Assessment & Plan Note (Signed)
Pain has subsided with steroid injections and she has been able to increase her walking time. Rarely needing to take Tylenol Has lost 14 lb in 2 mos with medically supervised weight management  Continue active plan for weight loss Slowly increase walking time F/u with ortho as needed

## 2022-12-27 NOTE — Assessment & Plan Note (Signed)
Lab Results  Component Value Date   CHOL 227 (H) 10/24/2022   HDL 56 10/24/2022   LDLCALC 146 (H) 10/24/2022   TRIG 139 10/24/2022   CHOLHDL 4.1 10/24/2022   She is off lipid lowering medications and is low risk based on her ASCVD risk score.  Doing well on prescribed meal plan which is low in saturated fats.  Recheck FLP in July The 10-year ASCVD risk score (Arnett DK, et al., 2019) is: 5.2%   Values used to calculate the score:     Age: 62 years     Sex: Female     Is Non-Hispanic African American: No     Diabetic: No     Tobacco smoker: No     Systolic Blood Pressure: 120 mmHg     Is BP treated: Yes     HDL Cholesterol: 56 mg/dL     Total Cholesterol: 227 mg/dL

## 2022-12-27 NOTE — Assessment & Plan Note (Signed)
Reviewed bioimpedence results She is maintaining most of her muscle mass She has increased walking time and is getting in lean protein with all of her meals She is happy with 14 lb of weight loss in 2 mos which is a 6.3% TBW loss Hunger and cravings under good control Packing lunch and meals on the go when traveling/ going on outings with granddaughter Getting tired of eggs.  Not eating bread on plan.  Alternative lunch option handouts provided She may rotate in a FairLife Protein shake + a fruit serving for breakfast OR a low sugar oatmeal + a greek yogurt OR Premier Protein cereal + Fairlife lowfat milk Continue to track daily steps Reviewed strategies for staying on track during upcoming beach trip

## 2022-12-27 NOTE — Assessment & Plan Note (Addendum)
Lab Results  Component Value Date   HGBA1C 6.0 (H) 10/24/2022   She is doing well with new start of metformin 500 mg once daily with food Cravings have improved  Denies GI side effects She has lost 6.3% TBW in 2 mos  Continue to work on prescribed meal plan, increasing walking time and weight reduction Read labels on food and drink for sugar, limiting items with over 8 g of sugar per serving

## 2022-12-27 NOTE — Progress Notes (Signed)
Office: [phone]  /  Fax: [phone]  WEIGHT SUMMARY AND BIOMETRICS  Starting Date: 10/24/22  Starting Weight: 220lb   Weight Lost Since Last Visit: 5lb   Vitals Temp: 97.7 F (36.5 C) BP: 120/80 Pulse Rate: 77 SpO2: 93 %   Body Composition  Body Fat %: 45.3 % Fat Mass (lbs): 93.6 lbs Muscle Mass (lbs): 107.2 lbs Total Body Water (lbs): 74 lbs Visceral Fat Rating : 13     HPI  Chief Complaint: OBESITY  Makayla [name] is here to discuss her progress with her obesity treatment plan. She is on the the Category 2 Plan and states she is following her eating plan approximately 75-80 % of the time. She states she is exercising 20-30 minutes 4 times per week.   Interval History:  Since last office visit she is down 5 lb This gives her a net weight loss of 14 lb in the past 2 mos of medically supervised weight management She is doing fairly well on Cat 2 meal plan Denies excess hunger or cravings She is back at work and is bringing lunch to work most days She started metformin 500 mg once daily last visit for Prediabetes She is doing indoor exercise 4 x week She is getting 4,000-10,000 steps daily  She is going to the beach in June Knee pain is improving post bilateral corticosteroid injections  Pharmacotherapy: metformin  PHYSICAL EXAM:  Blood pressure 120/80, pulse 77, temperature 97.7 F (36.5 C), height 5\' 4"  (1.626 m), weight 206 lb (93.4 kg), SpO2 93 %. Body mass index is 35.36 kg/m.  General: She is overweight, cooperative, alert, well developed, and in no acute distress. PSYCH: Has normal mood, affect and thought process.   Lungs: Normal breathing effort, no conversational dyspnea.   ASSESSMENT AND PLAN  TREATMENT PLAN FOR OBESITY:  Recommended Dietary Goals  Masako is currently in the action stage of change. As such, her goal is to continue weight management plan. She has agreed to the Category 2 Plan.  Behavioral Intervention  We discussed the  following Behavioral Modification Strategies today: increasing lean protein intake, decreasing simple carbohydrates , increasing vegetables, increasing lower glycemic fruits, avoiding skipping meals, increasing water intake, keeping healthy foods at home, continue to work on implementation of reduced calorie nutritional plan, continue to practice mindfulness when eating, planning for success, and staying on track while traveling and vacationing.  Additional resources provided today: NA  Recommended Physical Activity Goals  Venera has been advised to work up to 150 minutes of moderate intensity aerobic activity a week and strengthening exercises 2-3 times per week for cardiovascular health, weight loss maintenance and preservation of muscle mass.   She has agreed to [employer] strengthening exercises with a goal of 2-3 sessions a week  Aim for >5,000 steps daily  Pharmacotherapy changes for the treatment of obesity:   ASSOCIATED CONDITIONS ADDRESSED TODAY  Pre-diabetes Assessment & Plan: Lab Results  Component Value Date   HGBA1C 6.0 (H) 10/24/2022   She is doing well with new start of metformin 500 mg once daily with food Cravings have improved  Denies GI side effects She has lost 6.3% TBW in 2 mos  Continue to work on prescribed meal plan, increasing walking time and weight reduction Read labels on food and drink for sugar, limiting items with over 8 g of sugar per serving   Orders: -     metFORMIN HCl; Take 1 tablet (500 mg total) by mouth daily with breakfast.  Dispense: 90 tablet;  Refill: 0  Morbid obesity (HCC) with starting BMI 37 Assessment & Plan: Reviewed bioimpedence results She is maintaining most of her muscle mass She has increased walking time and is getting in lean protein with all of her meals She is happy with 14 lb of weight loss in 2 mos which is a 6.3% TBW loss Hunger and cravings under good control Packing lunch and meals on the go when traveling/ going on  outings with granddaughter Getting tired of eggs.  Not eating bread on plan.  Alternative lunch option handouts provided She may rotate in a [employer] Protein shake + a fruit serving for breakfast OR a low sugar oatmeal + a greek yogurt OR Premier Protein cereal + [employer] lowfat milk Continue to track daily steps Reviewed strategies for staying on track during upcoming beach trip   BMI 35.0-35.9,adult  Osteoarthritis of both knees, unspecified osteoarthritis type Assessment & Plan: Pain has subsided with steroid injections and she has been able to increase her walking time. Rarely needing to take Tylenol Has lost 14 lb in 2 mos with medically supervised weight management  Continue active plan for weight loss Slowly increase walking time F/u with ortho as needed   Other hyperlipidemia Assessment & Plan: Lab Results  Component Value Date   CHOL 227 (H) 10/24/2022   HDL 56 10/24/2022   LDLCALC 146 (H) 10/24/2022   TRIG 139 10/24/2022   CHOLHDL 4.1 10/24/2022   She is off lipid lowering medications and is low risk based on her ASCVD risk score.  Doing well on prescribed meal plan which is low in saturated fats.  Recheck FLP in July The 10-year ASCVD risk score (Arnett DK, et al., 2019) is: 5.2%   Values used to calculate the score:     Age: 62 years     Sex: Female     Is Non-Hispanic African American: No     Diabetic: No     Tobacco smoker: No     Systolic Blood Pressure: 120 mmHg     Is BP treated: Yes     HDL Cholesterol: 56 mg/dL     Total Cholesterol: 227 mg/dL        She was informed of the importance of frequent follow up visits to maximize her success with intensive lifestyle modifications for her multiple health conditions.   ATTESTASTION STATEMENTS:  Reviewed by clinician on day of visit: allergies, medications, problem list, medical history, surgical history, family history, social history, and previous encounter notes pertinent to obesity diagnosis.    I have personally spent 30 minutes total time today in preparation, patient care, nutritional counseling and documentation for this visit, including the following: review of clinical lab tests; review of medical tests/procedures/services.      Glennis Brink, DO DABFM, DABOM [facility] [address] [place], [place] [address] [phone]

## 2023-01-14 NOTE — Therapy (Unsigned)
OUTPATIENT PHYSICAL THERAPY FEMALE PELVIC EVALUATION   Patient Name: Makayla [name] MRN: [id number] DOB:01-13-1961, 62 y.o., female Today's Date: 01/15/2023  END OF SESSION:  PT End of Session - 01/15/23 0834     Visit Number 1    Date for PT Re-Evaluation 04/09/23    Authorization Type [employer]    PT Start Time 0830    PT Stop Time 0915    PT Time Calculation (min) 45 min    Activity Tolerance Patient tolerated treatment well    Behavior During Therapy WFL for tasks assessed/performed             Past Medical History:  Diagnosis Date   Allergy    pollen   Anxiety    Bursitis, trochanteric    bi/lat    Dental crowns present    x 4   Diverticulosis    Fatty liver    GERD (gastroesophageal reflux disease)    Hyperlipidemia    no current med.   Hypertension    white coat syndrome,   Mass of breast, left 01/2012   Obesity    Palpitations    PONV (postoperative nausea and vomiting)    Pre-diabetes    Pre-diabetes    Vitamin D deficiency    Past Surgical History:  Procedure Laterality Date   ABDOMINAL HYSTERECTOMY     partial   ANTERIOR AND POSTERIOR REPAIR WITH SACROSPINOUS FIXATION N/A 08/20/2022   Procedure: POSTERIOR REPAIR WITH PERINEORRHAPHY AND WITH  SACROSPINOUS FIXATION;  Surgeon: Marguerita Beards, MD;  Location: [facility];  Service: Gynecology;  Laterality: N/A;   BREAST BIOPSY Right 08/04/2021   BREAST EXCISIONAL BIOPSY Left 2013   BREAST SURGERY  01/10/2012   lumpectomy - left (excisional biopsy benign)   CHOLECYSTECTOMY N/A 02/10/2016   Procedure: LAPAROSCOPIC CHOLECYSTECTOMY WITH INTRAOPERATIVE CHOLANGIOGRAM;  Surgeon: Chevis Pretty III, MD;  Location: [facility];  Service: General;  Laterality: N/A;   COLONOSCOPY  04/06/2010   D Brodie, normal w/tics   RECTOCELE REPAIR  06/14/2011   cystocele repair, pt states Dr Jacquelyne Balint did not do rectocele and said she had to come back later.   ROBOTIC ASSISTED LAPAROSCOPIC SACROCOLPOPEXY   06/14/2011   Procedure: ROBOTIC ASSISTED LAPAROSCOPIC SACROCOLPOPEXY;  Surgeon: Crecencio Mc, MD;  Location: [facility];  Service: Urology;  Laterality: N/A;   TUBAL LIGATION     Patient Active Problem List   Diagnosis Date Noted   Osteoarthritis, knee 11/28/2022   Bilateral knee pain 11/23/2022   Hepatic steatosis 11/07/2022   Other fatigue 10/24/2022   SOBOE (shortness of breath on exertion) 10/24/2022   GAD (generalized anxiety disorder) 10/24/2022   Pre-diabetes 10/24/2022   Other hyperlipidemia 10/24/2022   Depression screen 10/24/2022   Elevated glucose 07/18/2022   Hemorrhoids 06/07/2022   Rib pain on left side 06/13/2020   Colon cancer screening 05/06/2020   Insomnia, psychophysiological 03/14/2020   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   Retro-orbital pain of left eye 03/14/2020   Pain head 02/25/2020   Pulsatile tinnitus of right ear 09/30/2019   Immunization reaction 09/08/2019   Grief reaction 10/03/2018   Elevated glucose level 01/16/2018   Generalized anxiety disorder 01/23/2017   Vitamin D deficiency 05/20/2015   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   Morbid obesity (HCC) with starting BMI 37 10/25/2011   Routine general medical examination at a health care facility 10/16/2011   Hyperlipidemia LDL goal <130 12/04/2007   GERD 12/04/2007   Essential hypertension 12/04/2007  PCP: Judy Pimple, MD  REFERRING PROVIDER: Marguerita Beards, MD   REFERRING DIAG: [phone] (ICD-10-CM) - Vaginal vault prolapse after hysterectomy   THERAPY DIAG:  Muscle weakness (generalized)  Unspecified lack of coordination  Rationale for Evaluation and Treatment: Rehabilitation  ONSET DATE: 08/20/22  SUBJECTIVE:                                                                                                                                                                                           SUBJECTIVE STATEMENT:  Pelvic PT was ordered  so she can learn strengthening exercises which can help prevent prolapse recurrence. avoidance of heavy lifting and straining long term to reduce the risk of recurrence. Patient is using the estrogen cream. Patient found a bulge after the surgery.   PAIN:  Are you having pain? No  PRECAUTIONS: avoidance of heavy lifting and straining long term to reduce the risk of recurrence.   WEIGHT BEARING RESTRICTIONS: No  FALLS:  Has patient fallen in last 6 months? 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  PLOF: Independent  PATIENT GOALS: learn how to strengthen her pelvic floor to not have prolapse get worse  PERTINENT HISTORY:  s/p Posterior repair, enterocele repair, perineorrhaphy, sacrospinous ligament fixation on 08/20/22 ; Cholecystectomy 02/10/2016; Rectocele repair  and sacrocolpopexy 06/14/2011;   BOWEL MOVEMENT: Pain with bowel movement: No Type of bowel movement:Type (Bristol Stool Scale) Type 5,6, Frequency daily, Strain No, and Splinting no Fully empty rectum: Yes:   Leakage: No Pads: No Fiber supplement: Yes: Miralax  URINATION: Pain with urination: No Fully empty bladder: Yes:   Stream: Strong Urgency: Yes: at times and not able to wait Frequency: every 2 hours Leakage:  none Pads: No  INTERCOURSE: not active  PREGNANCY: Vaginal deliveries 1 Tearing Yes: episiomty  PROLAPSE: Rectocele     OBJECTIVE:   DIAGNOSTIC FINDINGS:  none    COGNITION: Overall cognitive status: Within functional limits for tasks assessed     SENSATION: Light touch: Appears intact Proprioception: Appears intact   POSTURE: rounded shoulders and forward head  PELVIC ALIGNMENT: correct alignment  LUMBARAROM/PROM: lumbar ROM is full    LOWER EXTREMITY ROM:  Passive ROM Right eval Left eval  Hip external rotation 70 50   (Blank rows = not tested)  LOWER EXTREMITY MMT:  MMT Right eval  Left eval  Hip flexion 4/5 4/5  Hip extension 3+/5 3+/5  Hip abduction 3+/5 3+/5   PALPATION:   General  rib angle greater than 90 degrees and decreased movement; hysterectomy scar  decreased mobility, not able to contract abdominals instead will lift rib cage                External Perineal Exam red in coloring                             Internal Pelvic Floor posterior wall bulge noted to the introitus, tightness on the puborectalis, vaginally patient is not able to hug the therapist finger and more contraction with the levator muscle than the pubovaginalis  Patient confirms identification and approves PT to assess internal pelvic floor and treatment Yes  PELVIC MMT:   MMT eval  Vaginal 2/5  Internal Anal Sphincter 2/5 initially with 0/5 anteriorly; after manual work then contract 4/5 holding 10 sec  External Anal Sphincter 2/5 initially with 0/5 anteriorly; after manual work then contract 4/5 holding 10 sec  Puborectalis Initially 2/5  except for the right side 1/5 then after manual work 4/5   (Blank rows = not tested)        TONE: average  PROLAPSE: Posterior wall weakness coming to the introitus  TODAY'S TREATMENT:                                                                                                                              DATE: 01/15/23  EVAL See below   PATIENT EDUCATION:  01/15/23 Education details: Access Code: [email or web address] Person educated: Patient Education method: Explanation, Demonstration, Actor cues, Verbal cues, and Handouts Education comprehension: verbalized understanding, returned demonstration, verbal cues required, tactile cues required, and needs further education  HOME EXERCISE PROGRAM: 01/15/23 Access Code: [email or web address] URL: [email or web address] Date: 01/15/2023 Prepared by: Eulis Foster  Exercises - Supine Pelvic Floor Contraction  - 2 x daily - 7 x weekly - 1 sets - 10 reps - 10 sec hold - Quick Flick Pelvic Floor  Contractions in Hooklying  - 1 x daily - 7 x weekly - 1 sets - 5 reps  ASSESSMENT:  CLINICAL IMPRESSION: Patient is a 62 y.o. female who was seen today for physical therapy evaluation and treatment for vaginal vault prolapse after hysterectomy. She is s/p Posterior repair, enterocele repair, perineorrhaphy, sacrospinous ligament fixation on 08/20/22. Several months after the surgery she started to feel a bulge that presented as posterior wall weakness. The internal and external anal sphincter is 2/5 initially with 0/5 anteriorly then after manual work then contract 4/5 holding 10 sec. Initially the puborectalis was 2/5  except for the right side 1/5 then after manual work 4/5. Vaginally strength is 2/5 with not hug of therapist finger. She is not able to contract her abdominals. Her rib angle is greater than 90 degrees and decreased lower rib cage mobility. She has to take miralax daily to keep her stool soft enough to evacuate her stool. Patient would benefit from skilled therapy to improve pelvic floor strength and understand ways to manage prolapse.  OBJECTIVE IMPAIRMENTS: decreased activity tolerance, decreased endurance, decreased strength, and increased fascial restrictions.   ACTIVITY LIMITATIONS: carrying, lifting, and bending  PARTICIPATION LIMITATIONS: shopping and community activity  PERSONAL FACTORS: Age, Fitness, Time since onset of injury/illness/exacerbation, and 1 comorbidity: /p Posterior repair, enterocele repair, perineorrhaphy, sacrospinous ligament fixation on 08/20/22 ; Cholecystectomy 02/10/2016; Rectocele repair  and sacrocolpopexy 06/14/2011;   are also affecting patient's functional outcome.   REHAB POTENTIAL: Good  CLINICAL DECISION MAKING: Stable/uncomplicated  EVALUATION COMPLEXITY: Low   GOALS: Goals reviewed with patient? Yes  SHORT TERM GOALS: Target date: 02/11/23  Educated on correct toileting to reduce pressure on the pelvic floor.  Baseline: Goal status:  INITIAL  2.  Education on improvement of bowel movements with water intake, eating at certain times of the day, and reduce straining.  Baseline:  Goal status: INITIAL  3.  Education on positions to reduce rectocele during the day.  Baseline:  Goal status: INITIAL  4.  Education on posture and alignment to reduce pressure on the pelvic floor.  Baseline:  Goal status: INITIAL   LONG TERM GOALS: Target date: 04/08/23  Patient independent with HEP for core and pelvic floor strength.  Baseline:  Goal status: INITIAL  2.  Pelvic floor strength increased >/= 4/5 so she is able to pull up the pelvic floor with daily activities to reduce further progression of rectocele.  Baseline:  Goal status: INITIAL  3.  Patient is able to contract her abdominals correctly with lifting and home activities to reduce the pressure on the pelvic floor and pelvic organs.  Baseline:  Goal status: INITIAL  4.  Patient is able to demonstrate correct pressure management to reduce the progression of the rectocele.  Baseline:  Goal status: INITIAL   PLAN:  PT FREQUENCY: 1x/week  PT DURATION: 12 weeks  PLANNED INTERVENTIONS: Therapeutic exercises, Therapeutic activity, Neuromuscular re-education, Patient/Family education, Joint mobilization, Dry Needling, Electrical stimulation, Cryotherapy, Moist heat, Taping, Biofeedback, and Manual therapy  PLAN FOR NEXT SESSION: work on the vaginal canal for contraction, urogenital diaphragm release, abdominal contraction, rib mobility   Eulis Foster, PT 01/15/23 10:36 AM

## 2023-01-15 ENCOUNTER — Encounter: Payer: Managed Care, Other (non HMO) | Attending: Obstetrics and Gynecology | Admitting: Physical Therapy

## 2023-01-15 ENCOUNTER — Encounter: Payer: Self-pay | Admitting: Physical Therapy

## 2023-01-15 ENCOUNTER — Other Ambulatory Visit: Payer: Self-pay

## 2023-01-15 DIAGNOSIS — R279 Unspecified lack of coordination: Secondary | ICD-10-CM | POA: Diagnosis present

## 2023-01-15 DIAGNOSIS — M6281 Muscle weakness (generalized): Secondary | ICD-10-CM

## 2023-01-24 ENCOUNTER — Encounter: Payer: Managed Care, Other (non HMO) | Admitting: Physical Therapy

## 2023-01-24 ENCOUNTER — Encounter: Payer: Self-pay | Admitting: Physical Therapy

## 2023-01-24 DIAGNOSIS — M6281 Muscle weakness (generalized): Secondary | ICD-10-CM

## 2023-01-24 DIAGNOSIS — R279 Unspecified lack of coordination: Secondary | ICD-10-CM

## 2023-01-24 NOTE — Therapy (Signed)
OUTPATIENT PHYSICAL THERAPY FEMALE PELVIC TREATMENT   Patient Name: Makayla [name] MRN: [id number] DOB:Oct 01, 1960, 62 y.o., female Today's Date: 01/24/2023  END OF SESSION:  PT End of Session - 01/24/23 0937     Visit Number 2    Date for PT Re-Evaluation 04/09/23    Authorization Type [employer]    PT Start Time 0930    PT Stop Time 1015    PT Time Calculation (min) 45 min    Activity Tolerance Patient tolerated treatment well    Behavior During Therapy WFL for tasks assessed/performed             Past Medical History:  Diagnosis Date   Allergy    pollen   Anxiety    Bursitis, trochanteric    bi/lat    Dental crowns present    x 4   Diverticulosis    Fatty liver    GERD (gastroesophageal reflux disease)    Hyperlipidemia    no current med.   Hypertension    white coat syndrome,   Mass of breast, left 01/2012   Obesity    Palpitations    PONV (postoperative nausea and vomiting)    Pre-diabetes    Pre-diabetes    Vitamin D deficiency    Past Surgical History:  Procedure Laterality Date   ABDOMINAL HYSTERECTOMY     partial   ANTERIOR AND POSTERIOR REPAIR WITH SACROSPINOUS FIXATION N/A 08/20/2022   Procedure: POSTERIOR REPAIR WITH PERINEORRHAPHY AND WITH  SACROSPINOUS FIXATION;  Surgeon: Marguerita Beards, MD;  Location: [facility];  Service: Gynecology;  Laterality: N/A;   BREAST BIOPSY Right 08/04/2021   BREAST EXCISIONAL BIOPSY Left 2013   BREAST SURGERY  01/10/2012   lumpectomy - left (excisional biopsy benign)   CHOLECYSTECTOMY N/A 02/10/2016   Procedure: LAPAROSCOPIC CHOLECYSTECTOMY WITH INTRAOPERATIVE CHOLANGIOGRAM;  Surgeon: Chevis Pretty III, MD;  Location: [facility];  Service: General;  Laterality: N/A;   COLONOSCOPY  04/06/2010   D Brodie, normal w/tics   RECTOCELE REPAIR  06/14/2011   cystocele repair, pt states Dr Jacquelyne Balint did not do rectocele and said she had to come back later.   ROBOTIC ASSISTED LAPAROSCOPIC SACROCOLPOPEXY  06/14/2011    Procedure: ROBOTIC ASSISTED LAPAROSCOPIC SACROCOLPOPEXY;  Surgeon: Crecencio Mc, MD;  Location: [facility];  Service: Urology;  Laterality: N/A;   TUBAL LIGATION     Patient Active Problem List   Diagnosis Date Noted   Osteoarthritis, knee 11/28/2022   Bilateral knee pain 11/23/2022   Hepatic steatosis 11/07/2022   Other fatigue 10/24/2022   SOBOE (shortness of breath on exertion) 10/24/2022   GAD (generalized anxiety disorder) 10/24/2022   Pre-diabetes 10/24/2022   Other hyperlipidemia 10/24/2022   Depression screen 10/24/2022   Elevated glucose 07/18/2022   Hemorrhoids 06/07/2022   Rib pain on left side 06/13/2020   Colon cancer screening 05/06/2020   Insomnia, psychophysiological 03/14/2020   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   Retro-orbital pain of left eye 03/14/2020   Pain head 02/25/2020   Pulsatile tinnitus of right ear 09/30/2019   Immunization reaction 09/08/2019   Grief reaction 10/03/2018   Elevated glucose level 01/16/2018   Generalized anxiety disorder 01/23/2017   Vitamin D deficiency 05/20/2015   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   Morbid obesity (HCC) with starting BMI 37 10/25/2011   Routine general medical examination at a health care facility 10/16/2011   Hyperlipidemia LDL goal <130 12/04/2007   GERD 12/04/2007   Essential hypertension 12/04/2007  PCP: Judy Pimple, MD  REFERRING PROVIDER: Marguerita Beards, MD   REFERRING DIAG: [phone] (ICD-10-CM) - Vaginal vault prolapse after hysterectomy   THERAPY DIAG:  Muscle weakness (generalized)  Unspecified lack of coordination  Rationale for Evaluation and Treatment: Rehabilitation  ONSET DATE: 08/20/22  SUBJECTIVE:                                                                                                                                                                                           SUBJECTIVE STATEMENT: Patient feels some tone in the  rectum. When she wipes her perineum sags.     PAIN:  Are you having pain? No  PRECAUTIONS: avoidance of heavy lifting and straining long term to reduce the risk of recurrence.   WEIGHT BEARING RESTRICTIONS: No  FALLS:  Has patient fallen in last 6 months? 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  PLOF: Independent  PATIENT GOALS: learn how to strengthen her pelvic floor to not have prolapse get worse  PERTINENT HISTORY:  s/p Posterior repair, enterocele repair, perineorrhaphy, sacrospinous ligament fixation on 08/20/22 ; Cholecystectomy 02/10/2016; Rectocele repair  and sacrocolpopexy 06/14/2011;   BOWEL MOVEMENT: Pain with bowel movement: No Type of bowel movement:Type (Bristol Stool Scale) Type 5,6, Frequency daily, Strain No, and Splinting no Fully empty rectum: Yes:   Leakage: No Pads: No Fiber supplement: Yes: Miralax  URINATION: Pain with urination: No Fully empty bladder: Yes:   Stream: Strong Urgency: Yes: at times and not able to wait Frequency: every 2 hours Leakage:  none Pads: No  INTERCOURSE: not active  PREGNANCY: Vaginal deliveries 1 Tearing Yes: episiomty  PROLAPSE: Rectocele     OBJECTIVE:   DIAGNOSTIC FINDINGS:  none    COGNITION: Overall cognitive status: Within functional limits for tasks assessed     SENSATION: Light touch: Appears intact Proprioception: Appears intact   POSTURE: rounded shoulders and forward head  PELVIC ALIGNMENT: correct alignment  LUMBARAROM/PROM: lumbar ROM is full    LOWER EXTREMITY ROM:  Passive ROM Right eval Left eval  Hip external rotation 70 50   (Blank rows = not tested)  LOWER EXTREMITY MMT:  MMT Right eval Left eval  Hip flexion 4/5 4/5  Hip extension 3+/5 3+/5  Hip abduction 3+/5 3+/5   PALPATION:   General  rib angle greater than 90 degrees and decreased movement; hysterectomy scar decreased  mobility, not able to contract abdominals instead will lift rib cage                External Perineal  Exam red in coloring                             Internal Pelvic Floor posterior wall bulge noted to the introitus, tightness on the puborectalis, vaginally patient is not able to hug the therapist finger and more contraction with the levator muscle than the pubovaginalis  Patient confirms identification and approves PT to assess internal pelvic floor and treatment Yes  PELVIC MMT:   MMT eval 01/24/23  Vaginal 2/5 3/5 with weak lift holding for 5 sec and able to hug the therapist finger  Internal Anal Sphincter 2/5 initially with 0/5 anteriorly; after manual work then contract 4/5 holding 10 sec   External Anal Sphincter 2/5 initially with 0/5 anteriorly; after manual work then contract 4/5 holding 10 sec   Puborectalis Initially 2/5  except for the right side 1/5 then after manual work 4/5    (Blank rows = not tested)        TONE: average  PROLAPSE: Posterior wall weakness coming to the introitus  TODAY'S TREATMENT:       01/24/23 Manual: Myofascial release: Release of the urogenital diaphragm going through the layers  Internal pelvic floor techniques: No emotional/communication barriers or cognitive limitation. Patient is motivated to learn. Patient understands and agrees with treatment goals and plan. PT explains patient will be examined in standing, sitting, and lying down to see how their muscles and joints work. When they are ready, they will be asked to remove their underwear so PT can examine their perineum. The patient is also given the option of providing their own chaperone as one is not provided in our facility. The patient also has the right and is explained the right to defer or refuse any part of the evaluation or treatment including the internal exam. With the patient's consent, PT will use one gloved finger to gently assess the muscles of the pelvic floor, seeing how well  it contracts and relaxes and if there is muscle symmetry. After, the patient will get dressed and PT and patient will discuss exam findings and plan of care. PT and patient discuss plan of care, schedule, attendance policy and HEP activities.  Going through the vaginal canal working along the introitus releasing the tissue so the muscles can contract, also worked on the Press photographer re-education: Core facilitation: Supine hip flexion isometrics holding 3 sec 5 times each leg Bridge with ball squeeze and pelvic floor contraction 10 x  Pelvic floor contraction training: Going through the vagina with therapist finger to give tactile cues to the wall of the introitus and lower abdominal to lift the pelvic floor as she contracts and verbal cues to not lift her lower rib cage.                                                                                                                       PATIENT EDUCATION:  01/24/23 Education details: Access Code:  [email or web address] Person educated: Patient Education method: Explanation, Demonstration, Tactile cues, Verbal cues, and Handouts Education comprehension: verbalized understanding, returned demonstration, verbal cues required, tactile cues required, and needs further education  HOME EXERCISE PROGRAM: 01/24/23 Access Code: [email or web address] URL: [email or web address] Date: 01/24/2023 Prepared by: Eulis Foster  Exercises - Supine Pelvic Floor Contraction  - 2 x daily - 7 x weekly - 1 sets - 10 reps - 10 sec hold - Quick Flick Pelvic Floor Contractions in Hooklying  - 1 x daily - 7 x weekly - 1 sets - 5 reps - Hooklying Isometric Hip Flexion  - 1 x daily - 7 x weekly - 1 sets - 10 reps - 3 sec hold - Supine Bridge with Mini Swiss Ball Between Knees  - 1 x daily - 7 x weekly - 1 sets - 10 reps  ASSESSMENT:  CLINICAL IMPRESSION: Patient is a 62 y.o. female who was seen today for physical therapy  treatment for vaginal vault prolapse after  hysterectomy. She is s/p Posterior repair, enterocele repair, perineorrhaphy, sacrospinous ligament fixation on 08/20/22. Patient pelvic floor strength increased to 3/5 with weak lift of therapist finger. She needed cues to not lift her rib cage to isolate the pelvic floor. Patient can now feel the pelvic floor muscles contract and lift. Patient would benefit from skilled therapy to improve pelvic floor strength and understand ways to manage prolapse.   OBJECTIVE IMPAIRMENTS: decreased activity tolerance, decreased endurance, decreased strength, and increased fascial restrictions.   ACTIVITY LIMITATIONS: carrying, lifting, and bending  PARTICIPATION LIMITATIONS: shopping and community activity  PERSONAL FACTORS: Age, Fitness, Time since onset of injury/illness/exacerbation, and 1 comorbidity: /p Posterior repair, enterocele repair, perineorrhaphy, sacrospinous ligament fixation on 08/20/22 ; Cholecystectomy 02/10/2016; Rectocele repair  and sacrocolpopexy 06/14/2011;   are also affecting patient's functional outcome.   REHAB POTENTIAL: Good  CLINICAL DECISION MAKING: Stable/uncomplicated  EVALUATION COMPLEXITY: Low   GOALS: Goals reviewed with patient? Yes  SHORT TERM GOALS: Target date: 02/11/23  Educated on correct toileting to reduce pressure on the pelvic floor.  Baseline: Goal status: INITIAL  2.  Education on improvement of bowel movements with water intake, eating at certain times of the day, and reduce straining.  Baseline:  Goal status: INITIAL  3.  Education on positions to reduce rectocele during the day.  Baseline:  Goal status: INITIAL  4.  Education on posture and alignment to reduce pressure on the pelvic floor.  Baseline:  Goal status: INITIAL   LONG TERM GOALS: Target date: 04/08/23  Patient independent with HEP for core and pelvic floor strength.  Baseline:  Goal status: INITIAL  2.  Pelvic floor strength increased >/= 4/5 so she is able to pull up the pelvic  floor with daily activities to reduce further progression of rectocele.  Baseline:  Goal status: INITIAL  3.  Patient is able to contract her abdominals correctly with lifting and home activities to reduce the pressure on the pelvic floor and pelvic organs.  Baseline:  Goal status: INITIAL  4.  Patient is able to demonstrate correct pressure management to reduce the progression of the rectocele.  Baseline:  Goal status: INITIAL   PLAN:  PT FREQUENCY: 1x/week  PT DURATION: 12 weeks  PLANNED INTERVENTIONS: Therapeutic exercises, Therapeutic activity, Neuromuscular re-education, Patient/Family education, Joint mobilization, Dry Needling, Electrical stimulation, Cryotherapy, Moist heat, Taping, Biofeedback, and Manual therapy  PLAN FOR NEXT SESSION:  abdominal contraction, rib mobility, diaphragmatic breathing and toileting   Eulis Foster, PT 01/24/23 10:29 AM

## 2023-01-28 ENCOUNTER — Encounter (INDEPENDENT_AMBULATORY_CARE_PROVIDER_SITE_OTHER): Payer: Self-pay | Admitting: Family Medicine

## 2023-01-28 ENCOUNTER — Ambulatory Visit (INDEPENDENT_AMBULATORY_CARE_PROVIDER_SITE_OTHER): Payer: Managed Care, Other (non HMO) | Admitting: Family Medicine

## 2023-01-28 VITALS — BP 128/83 | HR 89 | Temp 97.8°F | Ht 64.0 in | Wt 206.0 lb

## 2023-01-28 DIAGNOSIS — E7849 Other hyperlipidemia: Secondary | ICD-10-CM | POA: Diagnosis not present

## 2023-01-28 DIAGNOSIS — Z6835 Body mass index (BMI) 35.0-35.9, adult: Secondary | ICD-10-CM

## 2023-01-28 DIAGNOSIS — R7303 Prediabetes: Secondary | ICD-10-CM

## 2023-01-28 NOTE — Assessment & Plan Note (Signed)
Reviewed patient's overall progress.  She is starting to make some positive changes to her diet, using her prescribed meal plan as directed.  She plans to ramp up her walking time now that her knee pain has improved status post bilateral corticosteroid injections.  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  Stress levels remain high.  Reviewed her overall progress of 6.3% total body weight loss in the past 3 months of medically supervised weight management.  She is maintaining her lean muscle mass.  Will focus on increasing walking time and consistency with food intake at mealtimes with lean protein and fiber with meals.

## 2023-01-28 NOTE — Assessment & Plan Note (Signed)
Lab Results  Component Value Date   HGBA1C 6.0 (H) 10/24/2022   She has done well on metformin 500 mg once daily with food.  She denies GI upset.  She has seen a decrease in food cravings and appetite while on metformin.  She is working on her prescribed meal plan, low in added sugar and refined carbohydrates.  She has lost 14 pounds in the past 3 months of medically supervised weight management.  This is a 6.3% total body weight loss.  Continue metformin 500 mg once daily with food.  Continue prescribed meal plan.  Plan increase walking time to at least 20 minutes 5 days a week. Recheck A1c, chemistry panel next visit with a B12 level

## 2023-01-28 NOTE — Progress Notes (Signed)
Office: [phone]  /  Fax: [phone]  WEIGHT SUMMARY AND BIOMETRICS  Starting Date: 10/24/22  Starting Weight: 220lb   Weight Lost Since Last Visit: 0lb   Vitals Temp: 97.8 F (36.6 C) BP: 128/83 Pulse Rate: 89 SpO2: 93 %   Body Composition  Body Fat %: 45.2 % Fat Mass (lbs): 93.4 lbs Muscle Mass (lbs): 107.4 lbs Total Body Water (lbs): 75.6 lbs Visceral Fat Rating : 13     HPI  Chief Complaint: OBESITY  Makayla [name] is here to discuss her progress with her obesity treatment plan. She is on the the Category 2 Plan and states she is following her eating plan approximately 80 % of the time. She states she is walking 4-5 times a week for 30 minutes.    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  Pharmacotherapy: Metformin 500 mg once daily for prediabetes  PHYSICAL EXAM:  Blood pressure 128/83, pulse 89, temperature 97.8 F (36.6 C), height 5\' 4"  (1.626 m), weight 206 lb (93.4 kg), SpO2 93 %. Body mass index is 35.36 kg/m.  General: She is overweight, cooperative, alert, well developed, and in no acute distress. PSYCH: Has normal mood, affect and thought process.   Lungs: Normal breathing effort, no conversational dyspnea.   ASSESSMENT AND PLAN  TREATMENT PLAN FOR OBESITY:  Recommended Dietary Goals  Makayla [name] is currently in the action stage of change. As such, her goal is to continue weight management  plan. She has agreed to the Category 2 Plan.  Behavioral Intervention  We discussed the following Behavioral Modification Strategies today: increasing lean protein intake, decreasing simple carbohydrates , increasing vegetables, increasing lower glycemic fruits, increasing water intake, work on meal planning and preparation, work on managing stress, creating time for self-care and relaxation measures, continue to practice mindfulness when eating, and planning for success.  Additional resources provided today: NA  Recommended Physical Activity Goals  Makayla [name] has been advised to work up to 150 minutes of moderate intensity aerobic activity a week and strengthening exercises 2-3 times per week for cardiovascular health, weight loss maintenance and preservation of muscle mass.   She has agreed to Increase the intensity, frequency or duration of aerobic exercises    Pharmacotherapy changes for the treatment of obesity: None  ASSOCIATED CONDITIONS ADDRESSED TODAY  Pre-diabetes Assessment & Plan: Lab Results  Component Value Date   HGBA1C 6.0 (H) 10/24/2022   She has done well on metformin 500 mg once daily with food.  She denies GI upset.  She has seen a decrease in food cravings and appetite while on metformin.  She is working on her prescribed meal plan, low in added sugar and refined carbohydrates.  She has lost 14 pounds in the past 3 months of medically supervised weight management.  This is a 6.3% total body weight  loss.  Continue metformin 500 mg once daily with food.  Continue prescribed meal plan.  Plan increase walking time to at least 20 minutes 5 days a week. Recheck A1c, chemistry panel next visit with a B12 level   Morbid obesity (HCC) with starting BMI 37 Assessment & Plan: Reviewed patient's overall progress.  She is starting to make some positive changes to her diet, using her prescribed meal plan as directed.  She plans to ramp up her walking time now that her knee pain has  improved status post bilateral corticosteroid injections.  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  Stress levels remain high.  Reviewed her overall progress of 6.3% total body weight loss in the past 3 months of medically supervised weight management.  She is maintaining her lean muscle mass.  Will focus on increasing walking time and consistency with food intake at mealtimes with lean protein and fiber with meals.   BMI 35.0-35.9,adult  Other hyperlipidemia Assessment & Plan: Lab Results  Component Value Date   CHOL 227 (H) 10/24/2022   HDL 56 10/24/2022   LDLCALC 146 (H) 10/24/2022   TRIG 139 10/24/2022   CHOLHDL 4.1 10/24/2022   Currently not on any lipid-lowering medication.  Cardiovascular risk score 5.9% in 10 years.  She has been actively working on a low saturated fat diet.  Plan to recheck lipid panel next month. The 10-year ASCVD risk score (Arnett DK, et al., 2019) is: 5.9%   Values used to calculate the score:     Age: 62 years     Sex: Female     Is Non-Hispanic African American: No     Diabetic: No     Tobacco smoker: No     Systolic Blood Pressure: 128 mmHg     Is BP treated: Yes     HDL Cholesterol: 56 mg/dL     Total Cholesterol: 227 mg/dL        She was informed of the importance of frequent follow up visits to maximize her success with intensive lifestyle modifications for her multiple health conditions.   ATTESTASTION STATEMENTS:  Reviewed by clinician on day of visit: allergies, medications, problem list, medical history, surgical history, family history, social history, and previous encounter notes pertinent to obesity diagnosis.   I have personally spent 30 minutes total time today in preparation, patient care, nutritional counseling and documentation for this visit, including the following: review of clinical lab tests; review of medical tests/procedures/services.      Glennis Brink, DO DABFM, DABOM [facility] [address] [place], [place] [address] [phone]

## 2023-01-28 NOTE — Assessment & Plan Note (Signed)
Lab Results  Component Value Date   CHOL 227 (H) 10/24/2022   HDL 56 10/24/2022   LDLCALC 146 (H) 10/24/2022   TRIG 139 10/24/2022   CHOLHDL 4.1 10/24/2022   Currently not on any lipid-lowering medication.  Cardiovascular risk score 5.9% in 10 years.  She has been actively working on a low saturated fat diet.  Plan to recheck lipid panel next month. The 10-year ASCVD risk score (Arnett DK, et al., 2019) is: 5.9%   Values used to calculate the score:     Age: 62 years     Sex: Female     Is Non-Hispanic African American: No     Diabetic: No     Tobacco smoker: No     Systolic Blood Pressure: 128 mmHg     Is BP treated: Yes     HDL Cholesterol: 56 mg/dL     Total Cholesterol: 227 mg/dL

## 2023-01-31 ENCOUNTER — Encounter: Payer: Self-pay | Admitting: Physical Therapy

## 2023-01-31 ENCOUNTER — Encounter: Payer: Managed Care, Other (non HMO) | Admitting: Physical Therapy

## 2023-01-31 DIAGNOSIS — M6281 Muscle weakness (generalized): Secondary | ICD-10-CM | POA: Diagnosis not present

## 2023-01-31 DIAGNOSIS — R279 Unspecified lack of coordination: Secondary | ICD-10-CM

## 2023-01-31 NOTE — Therapy (Signed)
OUTPATIENT PHYSICAL THERAPY FEMALE PELVIC TREATMENT   Patient Name: Makayla [name] MRN: [id number] DOB:[birth year], 62 y.o., female Today's Date: 01/31/2023  END OF SESSION:  PT End of Session - 01/31/23 0836     Visit Number 3    Date for PT Re-Evaluation 04/09/23    Authorization Type [employer]    Authorization - Visit Number 3    Authorization - Number of Visits 30    PT Start Time 0830    PT Stop Time 0915    PT Time Calculation (min) 45 min    Activity Tolerance Patient tolerated treatment well    Behavior During Therapy WFL for tasks assessed/performed             Past Medical History:  Diagnosis Date   Allergy    pollen   Anxiety    Bursitis, trochanteric    bi/lat    Dental crowns present    x 4   Diverticulosis    Fatty liver    GERD (gastroesophageal reflux disease)    Hyperlipidemia    no current med.   Hypertension    white coat syndrome,   Mass of breast, left 01/2012   Obesity    Palpitations    PONV (postoperative nausea and vomiting)    Pre-diabetes    Pre-diabetes    Vitamin D deficiency    Past Surgical History:  Procedure Laterality Date   ABDOMINAL HYSTERECTOMY     partial   ANTERIOR AND POSTERIOR REPAIR WITH SACROSPINOUS FIXATION N/A 08/20/2022   Procedure: POSTERIOR REPAIR WITH PERINEORRHAPHY AND WITH  SACROSPINOUS FIXATION;  Surgeon: Marguerita Beards, MD;  Location: [facility];  Service: Gynecology;  Laterality: N/A;   BREAST BIOPSY Right 08/04/2021   BREAST EXCISIONAL BIOPSY Left 2013   BREAST SURGERY  01/10/2012   lumpectomy - left (excisional biopsy benign)   CHOLECYSTECTOMY N/A 02/10/2016   Procedure: LAPAROSCOPIC CHOLECYSTECTOMY WITH INTRAOPERATIVE CHOLANGIOGRAM;  Surgeon: Chevis Pretty III, MD;  Location: [facility];  Service: General;  Laterality: N/A;   COLONOSCOPY  04/06/2010   D Brodie, normal w/tics   RECTOCELE REPAIR  06/14/2011   cystocele repair, pt states Dr Jacquelyne Balint did not do rectocele and said she had to  come back later.   ROBOTIC ASSISTED LAPAROSCOPIC SACROCOLPOPEXY  06/14/2011   Procedure: ROBOTIC ASSISTED LAPAROSCOPIC SACROCOLPOPEXY;  Surgeon: Crecencio Mc, MD;  Location: [facility];  Service: Urology;  Laterality: N/A;   TUBAL LIGATION     Patient Active Problem List   Diagnosis Date Noted   Osteoarthritis, knee 11/28/2022   Bilateral knee pain 11/23/2022   Hepatic steatosis 11/07/2022   Other fatigue 10/24/2022   SOBOE (shortness of breath on exertion) 10/24/2022   GAD (generalized anxiety disorder) 10/24/2022   Pre-diabetes 10/24/2022   Other hyperlipidemia 10/24/2022   Depression screen 10/24/2022   Elevated glucose 07/18/2022   Hemorrhoids 06/07/2022   Rib pain on left side 06/13/2020   Colon cancer screening 05/06/2020   Insomnia, psychophysiological 03/14/2020   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   Retro-orbital pain of left eye 03/14/2020   Pain head 02/25/2020   Pulsatile tinnitus of right ear 09/30/2019   Immunization reaction 09/08/2019   Grief reaction 10/03/2018   Elevated glucose level 01/16/2018   Generalized anxiety disorder 01/23/2017   Vitamin D deficiency 05/20/2015   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   Morbid obesity (HCC) with starting BMI 37 10/25/2011   Routine general medical examination at a health care facility  10/16/2011   Hyperlipidemia LDL goal <130 12/04/2007   GERD 12/04/2007   Essential hypertension 12/04/2007    PCP: Judy Pimple, MD  REFERRING PROVIDER: Marguerita Beards, MD   REFERRING DIAG: [phone] (ICD-10-CM) - Vaginal vault prolapse after hysterectomy   THERAPY DIAG:  Muscle weakness (generalized)  Unspecified lack of coordination  Rationale for Evaluation and Treatment: Rehabilitation  ONSET DATE: 08/20/22  SUBJECTIVE:                                                                                                                                                                                            SUBJECTIVE STATEMENT: I feel the prolapse may be worse. I feel like I have more tone internal. Perineal area may not sag as much.      PAIN:  Are you having pain? No  PRECAUTIONS: avoidance of heavy lifting and straining long term to reduce the risk of recurrence.   WEIGHT BEARING RESTRICTIONS: No  FALLS:  Has patient fallen in last 6 months? 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  PLOF: Independent  PATIENT GOALS: learn how to strengthen her pelvic floor to not have prolapse get worse  PERTINENT HISTORY:  s/p Posterior repair, enterocele repair, perineorrhaphy, sacrospinous ligament fixation on 08/20/22 ; Cholecystectomy 02/10/2016; Rectocele repair  and sacrocolpopexy 06/14/2011;   BOWEL MOVEMENT: Pain with bowel movement: No Type of bowel movement:Type (Bristol Stool Scale) Type 5,6, Frequency daily, Strain No, and Splinting no Fully empty rectum: Yes:   Leakage: No Pads: No Fiber supplement: Yes: Miralax  URINATION: Pain with urination: No Fully empty bladder: Yes:   Stream: Strong Urgency: Yes: at times and not able to wait Frequency: every 2 hours Leakage:  none Pads: No  INTERCOURSE: not active  PREGNANCY: Vaginal deliveries 1 Tearing Yes: episiomty  PROLAPSE: Rectocele     OBJECTIVE:   DIAGNOSTIC FINDINGS:  none    COGNITION: Overall cognitive status: Within functional limits for tasks assessed     SENSATION: Light touch: Appears intact Proprioception: Appears intact   POSTURE: rounded shoulders and forward head  PELVIC ALIGNMENT: correct alignment  LUMBARAROM/PROM: lumbar ROM is full    LOWER EXTREMITY ROM:  Passive ROM Right eval Left eval  Hip external rotation 70 50   (Blank rows = not tested)  LOWER EXTREMITY MMT:  MMT Right eval Left eval  Hip flexion 4/5 4/5  Hip extension 3+/5 3+/5  Hip abduction 3+/5 3+/5    PALPATION:   General  rib angle greater than 90 degrees and decreased movement; hysterectomy  scar decreased mobility, not able to contract abdominals instead will lift rib cage                External Perineal Exam red in coloring                             Internal Pelvic Floor posterior wall bulge noted to the introitus, tightness on the puborectalis, vaginally patient is not able to hug the therapist finger and more contraction with the levator muscle than the pubovaginalis  Patient confirms identification and approves PT to assess internal pelvic floor and treatment Yes  PELVIC MMT:   MMT eval 01/24/23  Vaginal 2/5 3/5 with weak lift holding for 5 sec and able to hug the therapist finger  Internal Anal Sphincter 2/5 initially with 0/5 anteriorly; after manual work then contract 4/5 holding 10 sec   External Anal Sphincter 2/5 initially with 0/5 anteriorly; after manual work then contract 4/5 holding 10 sec   Puborectalis Initially 2/5  except for the right side 1/5 then after manual work 4/5    (Blank rows = not tested)        TONE: average  PROLAPSE: Posterior wall weakness coming to the introitus  TODAY'S TREATMENT:      01/31/23  Exercises: Stretches/mobility: Educated patient on laying down with feet on bolster, hips elevated to reduce the prolapse 1-2 time per day Strengthening: Supine with hips on pillow, feet on bolster, with  ball squeeze and contract pelvic floor with abdominals 15 x Supine with hips on pillow and feet on bolster with ball squeeze and lift hips 15 x Supine with hips on pillow and feet on bolster and green band around knees, moving knees outward with pelvic floor contraction 15 x Supine with hips on pillow and feet on bolster and green band around knees, hip flexion isometrics 10 x each leg Standing bilateral shoulder extension with red band 10 x and engaging the core and pelvic floor Therapeutic activities: Functional strengthening  activities: Squat with pelvic floor contraction with correct posture and hip flexion and keeping the distance between the rib cage and pubic bone.  Squat with holding 3 pounds with correct technique Standing with distance between the rib cage and pubic bone to not compress the organs into the pelvic area     01/24/23 Manual: Myofascial release: Release of the urogenital diaphragm going through the layers  Internal pelvic floor techniques: No emotional/communication barriers or cognitive limitation. Patient is motivated to learn. Patient understands and agrees with treatment goals and plan. PT explains patient will be examined in standing, sitting, and lying down to see how their muscles and joints work. When they are ready, they will be asked to remove their underwear so PT can examine their perineum. The patient is also given the option of providing their own chaperone as one is not provided in our facility. The patient also has the right and is explained the right to defer or refuse any part of the evaluation or treatment including the internal exam. With the patient's consent, PT will use one gloved finger to gently assess the muscles of the pelvic floor, seeing how well it contracts and relaxes and if there is muscle symmetry. After, the patient will get dressed and PT and patient will discuss exam findings and plan of care. PT and patient discuss plan of care, schedule, attendance policy and HEP activities.  Going through the vaginal canal working along the  introitus releasing the tissue so the muscles can contract, also worked on the Press photographer re-education: Core facilitation: Supine hip flexion isometrics holding 3 sec 5 times each leg Bridge with ball squeeze and pelvic floor contraction 10 x  Pelvic floor contraction training: Going through the vagina with therapist finger to give tactile cues to the wall of the introitus and lower abdominal to lift the pelvic floor as she  contracts and verbal cues to not lift her lower rib cage.                                                                                                                       PATIENT EDUCATION:  01/31/23 Education details: Access Code: [email or web address], educated on lifting and posture Person educated: Patient Education method: Explanation, Demonstration, Tactile cues, Verbal cues, and Handouts Education comprehension: verbalized understanding, returned demonstration, verbal cues required, tactile cues required, and needs further education  HOME EXERCISE PROGRAM: 01/31/23 Access Code: [email or web address] URL: [email or web address] Date: 01/31/2023 Prepared by: Eulis Foster  Exercises - Supine Pelvic Floor Contraction  - 2 x daily - 7 x weekly - 1 sets - 10 reps - 10 sec hold - Quick Flick Pelvic Floor Contractions in Hooklying  - 1 x daily - 7 x weekly - 1 sets - 5 reps - Hooklying Isometric Hip Flexion  - 1 x daily - 7 x weekly - 1 sets - 10 reps - 3 sec hold - Supine Bridge with Mini Swiss Ball Between Knees  - 1 x daily - 7 x weekly - 1 sets - 10 reps - Pelvic Floor Muscle Contraction and Adductor Squeeze With Hips Elevated (for Pelvic Organ Prolapse)  - 2 x daily - 7 x weekly - 1 sets - 10 reps - Pelvic Floor Muscle Contraction and Pelvic Tilt to Bridge With Hips Elevated (for Pelvic Organ Prolapse)  - 1 x daily - 7 x weekly - 1 sets - 10 reps  ASSESSMENT:  CLINICAL IMPRESSION: Patient is a 62 y.o. female who was seen today for physical therapy  treatment for vaginal vault prolapse after hysterectomy. She is s/p Posterior repair, enterocele repair, perineorrhaphy, sacrospinous ligament fixation on 08/20/22. Patient pelvic floor strength increased to 3/5 with weak lift of therapist finger. Patient needed verbal cues to flex her hips with squatting to reduce strain on the pelvic floor and knees. She is understanding how to reduce pressure on the rectocele during the day. She is able to feel the  pelvic floor contraction now. Patient would benefit from skilled therapy to improve pelvic floor strength and understand ways to manage prolapse.   OBJECTIVE IMPAIRMENTS: decreased activity tolerance, decreased endurance, decreased strength, and increased fascial restrictions.   ACTIVITY LIMITATIONS: carrying, lifting, and bending  PARTICIPATION LIMITATIONS: shopping and community activity  PERSONAL FACTORS: Age, Fitness, Time since onset of injury/illness/exacerbation, and 1 comorbidity: /p Posterior repair, enterocele repair, perineorrhaphy, sacrospinous ligament fixation on 08/20/22 ; Cholecystectomy 02/10/2016; Rectocele repair  and sacrocolpopexy 06/14/2011;   are also affecting  patient's functional outcome.   REHAB POTENTIAL: Good  CLINICAL DECISION MAKING: Stable/uncomplicated  EVALUATION COMPLEXITY: Low   GOALS: Goals reviewed with patient? Yes  SHORT TERM GOALS: Target date: 02/11/23  Educated on correct toileting to reduce pressure on the pelvic floor.  Baseline: Goal status: INITIAL  2.  Education on improvement of bowel movements with water intake, eating at certain times of the day, and reduce straining.  Baseline:  Goal status: INITIAL  3.  Education on positions to reduce rectocele during the day.  Baseline:  Goal status: Met 01/31/23  4.  Education on posture and alignment to reduce pressure on the pelvic floor.  Baseline:  Goal status: Met 01/31/23   LONG TERM GOALS: Target date: 04/08/23  Patient independent with HEP for core and pelvic floor strength.  Baseline:  Goal status: INITIAL  2.  Pelvic floor strength increased >/= 4/5 so she is able to pull up the pelvic floor with daily activities to reduce further progression of rectocele.  Baseline:  Goal status: INITIAL  3.  Patient is able to contract her abdominals correctly with lifting and home activities to reduce the pressure on the pelvic floor and pelvic organs.  Baseline:  Goal status: INITIAL  4.   Patient is able to demonstrate correct pressure management to reduce the progression of the rectocele.  Baseline:  Goal status: INITIAL   PLAN:  PT FREQUENCY: 1x/week  PT DURATION: 12 weeks  PLANNED INTERVENTIONS: Therapeutic exercises, Therapeutic activity, Neuromuscular re-education, Patient/Family education, Joint mobilization, Dry Needling, Electrical stimulation, Cryotherapy, Moist heat, Taping, Biofeedback, and Manual therapy  PLAN FOR NEXT SESSION:  toileting, progress core exercises in quadruped and sidley   Eulis Foster, PT 01/31/23 9:34 AM

## 2023-02-14 ENCOUNTER — Encounter: Payer: Self-pay | Admitting: Physical Therapy

## 2023-02-14 ENCOUNTER — Encounter: Payer: Managed Care, Other (non HMO) | Attending: Obstetrics and Gynecology | Admitting: Physical Therapy

## 2023-02-14 DIAGNOSIS — M6281 Muscle weakness (generalized): Secondary | ICD-10-CM | POA: Diagnosis present

## 2023-02-14 DIAGNOSIS — R279 Unspecified lack of coordination: Secondary | ICD-10-CM | POA: Diagnosis present

## 2023-02-14 NOTE — Therapy (Signed)
OUTPATIENT PHYSICAL THERAPY FEMALE PELVIC TREATMENT   Patient Name: Makayla [name] MRN: [id number] DOB:1960/11/15, 62 y.o., female Today's Date: 02/14/2023  END OF SESSION:  PT End of Session - 02/14/23 0832     Visit Number 4    Date for PT Re-Evaluation 04/09/23    Authorization Type [employer]    Authorization - Visit Number 4    Authorization - Number of Visits 30    PT Start Time 0830    PT Stop Time 0915    PT Time Calculation (min) 45 min    Activity Tolerance Patient tolerated treatment well    Behavior During Therapy WFL for tasks assessed/performed             Past Medical History:  Diagnosis Date   Allergy    pollen   Anxiety    Bursitis, trochanteric    bi/lat    Dental crowns present    x 4   Diverticulosis    Fatty liver    GERD (gastroesophageal reflux disease)    Hyperlipidemia    no current med.   Hypertension    white coat syndrome,   Mass of breast, left 01/2012   Obesity    Palpitations    PONV (postoperative nausea and vomiting)    Pre-diabetes    Pre-diabetes    Vitamin D deficiency    Past Surgical History:  Procedure Laterality Date   ABDOMINAL HYSTERECTOMY     partial   ANTERIOR AND POSTERIOR REPAIR WITH SACROSPINOUS FIXATION N/A 08/20/2022   Procedure: POSTERIOR REPAIR WITH PERINEORRHAPHY AND WITH  SACROSPINOUS FIXATION;  Surgeon: Marguerita Beards, MD;  Location: [facility];  Service: Gynecology;  Laterality: N/A;   BREAST BIOPSY Right 08/04/2021   BREAST EXCISIONAL BIOPSY Left 2013   BREAST SURGERY  01/10/2012   lumpectomy - left (excisional biopsy benign)   CHOLECYSTECTOMY N/A 02/10/2016   Procedure: LAPAROSCOPIC CHOLECYSTECTOMY WITH INTRAOPERATIVE CHOLANGIOGRAM;  Surgeon: Chevis Pretty III, MD;  Location: [facility];  Service: General;  Laterality: N/A;   COLONOSCOPY  04/06/2010   D Brodie, normal w/tics   RECTOCELE REPAIR  06/14/2011   cystocele repair, pt states Dr Jacquelyne Balint did not do rectocele and said she had to  come back later.   ROBOTIC ASSISTED LAPAROSCOPIC SACROCOLPOPEXY  06/14/2011   Procedure: ROBOTIC ASSISTED LAPAROSCOPIC SACROCOLPOPEXY;  Surgeon: Crecencio Mc, MD;  Location: [facility];  Service: Urology;  Laterality: N/A;   TUBAL LIGATION     Patient Active Problem List   Diagnosis Date Noted   Osteoarthritis, knee 11/28/2022   Bilateral knee pain 11/23/2022   Hepatic steatosis 11/07/2022   Other fatigue 10/24/2022   SOBOE (shortness of breath on exertion) 10/24/2022   GAD (generalized anxiety disorder) 10/24/2022   Pre-diabetes 10/24/2022   Other hyperlipidemia 10/24/2022   Depression screen 10/24/2022   Elevated glucose 07/18/2022   Hemorrhoids 06/07/2022   Rib pain on left side 06/13/2020   Colon cancer screening 05/06/2020   Insomnia, psychophysiological 03/14/2020   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   Retro-orbital pain of left eye 03/14/2020   Pain head 02/25/2020   Pulsatile tinnitus of right ear 09/30/2019   Immunization reaction 09/08/2019   Grief reaction 10/03/2018   Elevated glucose level 01/16/2018   Generalized anxiety disorder 01/23/2017   Vitamin D deficiency 05/20/2015   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   Morbid obesity (HCC) with starting BMI 37 10/25/2011   Routine general medical examination at a health care facility  10/16/2011   Hyperlipidemia LDL goal <130 12/04/2007   GERD 12/04/2007   Essential hypertension 12/04/2007    PCP: Judy Pimple, MD  REFERRING PROVIDER: Marguerita Beards, MD   REFERRING DIAG: [phone] (ICD-10-CM) - Vaginal vault prolapse after hysterectomy   THERAPY DIAG:  Muscle weakness (generalized)  Unspecified lack of coordination  Rationale for Evaluation and Treatment: Rehabilitation  ONSET DATE: 08/20/22  SUBJECTIVE:                                                                                                                                                                                            SUBJECTIVE STATEMENT: The prolapse feels the same. I am using my cream.        PAIN:  Are you having pain? No  PRECAUTIONS: avoidance of heavy lifting and straining long term to reduce the risk of recurrence.   WEIGHT BEARING RESTRICTIONS: No  FALLS:  Has patient fallen in last 6 months? 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  PLOF: Independent  PATIENT GOALS: learn how to strengthen her pelvic floor to not have prolapse get worse  PERTINENT HISTORY:  s/p Posterior repair, enterocele repair, perineorrhaphy, sacrospinous ligament fixation on 08/20/22 ; Cholecystectomy 02/10/2016; Rectocele repair  and sacrocolpopexy 06/14/2011;   BOWEL MOVEMENT: Pain with bowel movement: No Type of bowel movement:Type (Bristol Stool Scale) Type 5,6, Frequency daily, Strain No, and Splinting no Fully empty rectum: Yes:   Leakage: No Pads: No Fiber supplement: Yes: Miralax  URINATION: Pain with urination: No Fully empty bladder: Yes:   Stream: Strong Urgency: Yes: at times and not able to wait Frequency: every 2 hours Leakage:  none Pads: No  INTERCOURSE: not active  PREGNANCY: Vaginal deliveries 1 Tearing Yes: episiomty  PROLAPSE: Rectocele     OBJECTIVE:   DIAGNOSTIC FINDINGS:  none    COGNITION: Overall cognitive status: Within functional limits for tasks assessed     SENSATION: Light touch: Appears intact Proprioception: Appears intact   POSTURE: rounded shoulders and forward head  PELVIC ALIGNMENT: correct alignment  LUMBARAROM/PROM: lumbar ROM is full    LOWER EXTREMITY ROM:  Passive ROM Right eval Left eval  Hip external rotation 70 50   (Blank rows = not tested)  LOWER EXTREMITY MMT:  MMT Right eval Left eval  Hip flexion 4/5 4/5  Hip extension 3+/5 3+/5  Hip abduction 3+/5 3+/5   PALPATION:   General  rib angle greater than 90 degrees  and decreased movement; hysterectomy scar decreased mobility, not able to contract abdominals instead will  lift rib cage                External Perineal Exam red in coloring                             Internal Pelvic Floor posterior wall bulge noted to the introitus, tightness on the puborectalis, vaginally patient is not able to hug the therapist finger and more contraction with the levator muscle than the pubovaginalis  Patient confirms identification and approves PT to assess internal pelvic floor and treatment Yes  PELVIC MMT:   MMT eval 01/24/23  Vaginal 2/5 3/5 with weak lift holding for 5 sec and able to hug the therapist finger  Internal Anal Sphincter 2/5 initially with 0/5 anteriorly; after manual work then contract 4/5 holding 10 sec   External Anal Sphincter 2/5 initially with 0/5 anteriorly; after manual work then contract 4/5 holding 10 sec   Puborectalis Initially 2/5  except for the right side 1/5 then after manual work 4/5    (Blank rows = not tested)        TONE: average  PROLAPSE: Posterior wall weakness coming to the introitus  TODAY'S TREATMENT:      02/14/23 Exercises: Stretches/mobility: Hamstring stretch with strap holding for 30 sec bil.  Strengthening: Bridge with feet in different positions to work with leg cramps but she continued to have them Prone with toes on the mat and push the knee upward to contract the gluteals 10 x each side Lay on side hip adduction with breathing out when lift leg 15 x each side Supine hip flexion with resistance 20 x  Standing bird dog 10 x each side Therapeutic activities: Functional strengthening activities: Educated patient on different prolapse supports externally that she wears with different websites to look at [facility] patient in correct way to have a bowel movement with knees above hips, expanding the lower rib cage, breathing out with tension to relax the pelvic floor and engage the lower abdominals then end with  a anal contraction to return to normal tone    01/31/23  Exercises: Stretches/mobility: Educated patient on laying down with feet on bolster, hips elevated to reduce the prolapse 1-2 time per day Strengthening: Supine with hips on pillow, feet on bolster, with  ball squeeze and contract pelvic floor with abdominals 15 x Supine with hips on pillow and feet on bolster with ball squeeze and lift hips 15 x Supine with hips on pillow and feet on bolster and green band around knees, moving knees outward with pelvic floor contraction 15 x Supine with hips on pillow and feet on bolster and green band around knees, hip flexion isometrics 10 x each leg Standing bilateral shoulder extension with red band 10 x and engaging the core and pelvic floor Therapeutic activities: Functional strengthening activities: Squat with pelvic floor contraction with correct posture and hip flexion and keeping the distance between the rib cage and pubic bone.  Squat with holding 3 pounds with correct technique Standing with distance between the rib cage and pubic bone to not compress the organs into the pelvic area     01/24/23 Manual: Myofascial release: Release of the urogenital diaphragm going through the layers  Internal pelvic floor techniques: No emotional/communication barriers or cognitive limitation. Patient is motivated to learn. Patient understands and agrees with treatment goals and plan. PT explains patient will be examined in standing, sitting, and lying down to see how their muscles and  joints work. When they are ready, they will be asked to remove their underwear so PT can examine their perineum. The patient is also given the option of providing their own chaperone as one is not provided in our facility. The patient also has the right and is explained the right to defer or refuse any part of the evaluation or treatment including the internal exam. With the patient's consent, PT will use one gloved finger to  gently assess the muscles of the pelvic floor, seeing how well it contracts and relaxes and if there is muscle symmetry. After, the patient will get dressed and PT and patient will discuss exam findings and plan of care. PT and patient discuss plan of care, schedule, attendance policy and HEP activities.  Going through the vaginal canal working along the introitus releasing the tissue so the muscles can contract, also worked on the Press photographer re-education: Core facilitation: Supine hip flexion isometrics holding 3 sec 5 times each leg Bridge with ball squeeze and pelvic floor contraction 10 x  Pelvic floor contraction training: Going through the vagina with therapist finger to give tactile cues to the wall of the introitus and lower abdominal to lift the pelvic floor as she contracts and verbal cues to not lift her lower rib cage.                                                                                                                       PATIENT EDUCATION:  02/14/23 Education details: Access Code: [email or web address], educated correct ways to have a bowel movement Person educated: Patient Education method: Explanation, Demonstration, Tactile cues, Verbal cues, and Handouts Education comprehension: verbalized understanding, returned demonstration, verbal cues required, tactile cues required, and needs further education  HOME EXERCISE PROGRAM: 02/14/23 Access Code: [email or web address] URL: [email or web address] Date: 02/14/2023 Prepared by: Eulis Foster  Exercises - Supine Pelvic Floor Contraction  - 2 x daily - 7 x weekly - 1 sets - 10 reps - 10 sec hold - Quick Flick Pelvic Floor Contractions in Hooklying  - 1 x daily - 7 x weekly - 1 sets - 5 reps - Hooklying Isometric Hip Flexion  - 1 x daily - 7 x weekly - 1 sets - 10 reps - 3 sec hold - Pelvic Floor Muscle Contraction and Adductor Squeeze With Hips Elevated (for Pelvic Organ Prolapse)  - 2 x daily - 7 x weekly - 1 sets -  10 reps - Pelvic Floor Muscle Contraction and Pelvic Tilt to Bridge With Hips Elevated (for Pelvic Organ Prolapse)  - 1 x daily - 7 x weekly - 1 sets - 10 reps - Supine Hamstring Stretch with Strap  - 1 x daily - 3 x weekly - 1 sets - 2 reps - 30 sec hold - Prone Quadriceps Set  - 1 x daily - 2 x weekly - 2 sets - 10 reps - Sidelying Hip Adduction  - 1 x daily - 2 x weekly - 2 sets - 10  reps - Bird Dog on Counter  - 1 x daily - 2 x weekly - 2 sets - 10 reps   ASSESSMENT:  CLINICAL IMPRESSION: Patient is a 62 y.o. female who was seen today for physical therapy  treatment for vaginal vault prolapse after hysterectomy. She is s/p Posterior repair, enterocele repair, perineorrhaphy, sacrospinous ligament fixation on 08/20/22. Patient has learned how to have a bowel movement with decreased stress on the pelvic floor. She is able to perform diaphragmatic breathing in sitting and feel the pelvic floor relax. She has trouble doing bridges due to cramps in her hamstring and weakness.  Patient was educated on other ways to externally support her prolapse. Patient would benefit from skilled therapy to improve pelvic floor strength and understand ways to manage prolapse.   OBJECTIVE IMPAIRMENTS: decreased activity tolerance, decreased endurance, decreased strength, and increased fascial restrictions.   ACTIVITY LIMITATIONS: carrying, lifting, and bending  PARTICIPATION LIMITATIONS: shopping and community activity  PERSONAL FACTORS: Age, Fitness, Time since onset of injury/illness/exacerbation, and 1 comorbidity: /p Posterior repair, enterocele repair, perineorrhaphy, sacrospinous ligament fixation on 08/20/22 ; Cholecystectomy 02/10/2016; Rectocele repair  and sacrocolpopexy 06/14/2011;   are also affecting patient's functional outcome.   REHAB POTENTIAL: Good  CLINICAL DECISION MAKING: Stable/uncomplicated  EVALUATION COMPLEXITY: Low   GOALS: Goals reviewed with patient? Yes  SHORT TERM GOALS: Target  date: 02/11/23  Educated on correct toileting to reduce pressure on the pelvic floor.  Baseline: Goal status: Met 02/14/23  2.  Education on improvement of bowel movements with water intake, eating at certain times of the day, and reduce straining.  Baseline:  Goal status: Met 02/14/23  3.  Education on positions to reduce rectocele during the day.  Baseline:  Goal status: Met 01/31/23  4.  Education on posture and alignment to reduce pressure on the pelvic floor.  Baseline:  Goal status: Met 01/31/23   LONG TERM GOALS: Target date: 04/08/23  Patient independent with HEP for core and pelvic floor strength.  Baseline:  Goal status: INITIAL  2.  Pelvic floor strength increased >/= 4/5 so she is able to pull up the pelvic floor with daily activities to reduce further progression of rectocele.  Baseline:  Goal status: INITIAL  3.  Patient is able to contract her abdominals correctly with lifting and home activities to reduce the pressure on the pelvic floor and pelvic organs.  Baseline:  Goal status: INITIAL  4.  Patient is able to demonstrate correct pressure management to reduce the progression of the rectocele.  Baseline:  Goal status: INITIAL   PLAN:  PT FREQUENCY: 1x/week  PT DURATION: 12 weeks  PLANNED INTERVENTIONS: Therapeutic exercises, Therapeutic activity, Neuromuscular re-education, Patient/Family education, Joint mobilization, Dry Needling, Electrical stimulation, Cryotherapy, Moist heat, Taping, Biofeedback, and Manual therapy  PLAN FOR NEXT SESSION:  progress core exercises for core and hips, check pelvic floor strength,   Eulis Foster, PT 02/14/23 8:32 AM

## 2023-02-22 ENCOUNTER — Ambulatory Visit (INDEPENDENT_AMBULATORY_CARE_PROVIDER_SITE_OTHER): Payer: Managed Care, Other (non HMO) | Admitting: Family Medicine

## 2023-02-22 ENCOUNTER — Encounter: Payer: Self-pay | Admitting: Family Medicine

## 2023-02-22 VITALS — BP 114/66 | HR 60 | Temp 97.6°F | Ht 64.0 in | Wt 208.4 lb

## 2023-02-22 DIAGNOSIS — K760 Fatty (change of) liver, not elsewhere classified: Secondary | ICD-10-CM

## 2023-02-22 DIAGNOSIS — I10 Essential (primary) hypertension: Secondary | ICD-10-CM | POA: Diagnosis not present

## 2023-02-22 DIAGNOSIS — L299 Pruritus, unspecified: Secondary | ICD-10-CM

## 2023-02-22 LAB — CBC WITH DIFFERENTIAL/PLATELET
Absolute Monocytes: 759 cells/uL (ref 200–950)
Basophils Absolute: 52 cells/uL (ref 0–200)
Basophils Relative: 0.5 %
Eosinophils Absolute: 135 cells/uL (ref 15–500)
Eosinophils Relative: 1.3 %
MCHC: 32 g/dL (ref 32.0–36.0)
MCV: 79.7 fL — ABNORMAL LOW (ref 80.0–100.0)
MPV: 10.8 fL (ref 7.5–12.5)
Monocytes Relative: 7.3 %
RBC: 5.41 10*6/uL — ABNORMAL HIGH (ref 3.80–5.10)
RDW: 14.2 % (ref 11.0–15.0)
Total Lymphocyte: 21.9 %
WBC: 10.4 10*3/uL (ref 3.8–10.8)

## 2023-02-22 NOTE — Patient Instructions (Signed)
Change doseing of zyrtec to 10 mg daily to bedtime (it is sedating)   Use scent free products Dove soap for sensitive skin  Hold off on aaveeno  Use scent free moisturizer   No perfumes Continue scent free laundry products   Watch for any swelling in mouth /throat (call 911 if you cannot breathe)  Let's check some labs  Stay cool

## 2023-02-22 NOTE — Assessment & Plan Note (Addendum)
Pt has had recent itching and no rash  No med changes  Has been working hard on weight loss-down 14 lb per pt  Labs today

## 2023-02-22 NOTE — Assessment & Plan Note (Signed)
bp in fair control at this time  BP Readings from Last 1 Encounters:  02/22/23 114/66   No changes needed Most recent labs reviewed  Disc lifstyle change with low sodium diet and exercise  Plan to continue amlodipine 2.5 mg daily

## 2023-02-22 NOTE — Assessment & Plan Note (Signed)
This is different than last time/ more widespread and without feeling of anxiety  This occurred a week after beach trip /did swim in ocean  No rash at all  Has history of hepatic steatosis Reassuring exam  Does get occational abd discomfort from metformin and plans to discussed with her weight loss specialist   Lab today incl bmet and liver and cbc Discussed using scent free products Watch for rash  Avoid heat /hot water  Continue zyrtec 10 mg daily and change to bedtime dosing Call back and Er precautions noted in detail today

## 2023-02-22 NOTE — Progress Notes (Signed)
Subjective:    Patient ID: Makayla [name], female    DOB: November 10, 1960, 62 y.o.   MRN: [id number]  HPI  Wt Readings from Last 3 Encounters:  02/22/23 208 lb 6 oz (94.5 kg)  01/28/23 206 lb (93.4 kg)  12/27/22 206 lb (93.4 kg)   35.77 kg/m  Vitals:   02/22/23 0831  BP: 114/66  Pulse: 60  Temp: 97.6 F (36.4 C)  SpO2: 98%    Pt presents with all over itching  Went to the beach 3 weeks ago  Did swim in the ocean (no problem until she got back)  About a week after return noted itching   No rash at all  No jaundice  No stomach upset   Did eat a vegetable sushi with soy sauce (no fish or shellfish)   Not severe Just annoying  Some days are worse than others   Hot air worsens it   Worst area one night was left axilla   Uses Dove soap Aaveeno body wash  Free clothing detergent  Uses downy fabric softener- fragrance free and dye free  Uses philosophy products    Newest med is metformin -since may   Has had itching from anxiety in the past but this feels different / more wide spread   Not anxious right now / things are going ok    Lab Results  Component Value Date   ALT 15 10/24/2022   AST 15 10/24/2022   ALKPHOS 123 (H) 10/24/2022   BILITOT 0.3 10/24/2022   History of hepatic steatosis     Patient Active Problem List   Diagnosis Date Noted   Osteoarthritis, knee 11/28/2022   Bilateral knee pain 11/23/2022   Hepatic steatosis 11/07/2022   Other fatigue 10/24/2022   SOBOE (shortness of breath on exertion) 10/24/2022   GAD (generalized anxiety disorder) 10/24/2022   Pre-diabetes 10/24/2022   Other hyperlipidemia 10/24/2022   Depression screen 10/24/2022   Elevated glucose 07/18/2022   Hemorrhoids 06/07/2022   Rib pain on left side 06/13/2020   Colon cancer screening 05/06/2020   Insomnia, psychophysiological 03/14/2020   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   Retro-orbital pain of left eye 03/14/2020   Pain head 02/25/2020    Pruritus 01/08/2020   Pulsatile tinnitus of right ear 09/30/2019   Immunization reaction 09/08/2019   Grief reaction 10/03/2018   Elevated glucose level 01/16/2018   Generalized anxiety disorder 01/23/2017   Vitamin D deficiency 05/20/2015   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   Morbid obesity (HCC) with starting BMI 37 10/25/2011   Routine general medical examination at a health care facility 10/16/2011   Hyperlipidemia LDL goal <130 12/04/2007   GERD 12/04/2007   Essential hypertension 12/04/2007   Past Medical History:  Diagnosis Date   Allergy    pollen   Anxiety    Bursitis, trochanteric    bi/lat    Dental crowns present    x 4   Diverticulosis    Fatty liver    GERD (gastroesophageal reflux disease)    Hyperlipidemia    no current med.   Hypertension    white coat syndrome,   Mass of breast, left 01/2012   Obesity    Palpitations    PONV (postoperative nausea and vomiting)    Pre-diabetes    Pre-diabetes    Vitamin D deficiency    Past Surgical History:  Procedure Laterality Date   ABDOMINAL HYSTERECTOMY     partial   ANTERIOR  AND POSTERIOR REPAIR WITH SACROSPINOUS FIXATION N/A 08/20/2022   Procedure: POSTERIOR REPAIR WITH PERINEORRHAPHY AND WITH  SACROSPINOUS FIXATION;  Surgeon: Marguerita Beards, MD;  Location: [facility];  Service: Gynecology;  Laterality: N/A;   BREAST BIOPSY Right 08/04/2021   BREAST EXCISIONAL BIOPSY Left 2013   BREAST SURGERY  01/10/2012   lumpectomy - left (excisional biopsy benign)   CHOLECYSTECTOMY N/A 02/10/2016   Procedure: LAPAROSCOPIC CHOLECYSTECTOMY WITH INTRAOPERATIVE CHOLANGIOGRAM;  Surgeon: Chevis Pretty III, MD;  Location: [facility];  Service: General;  Laterality: N/A;   COLONOSCOPY  04/06/2010   D Brodie, normal w/tics   RECTOCELE REPAIR  06/14/2011   cystocele repair, pt states Dr Jacquelyne Balint did not do rectocele and said she had to come back later.   ROBOTIC ASSISTED LAPAROSCOPIC  SACROCOLPOPEXY  06/14/2011   Procedure: ROBOTIC ASSISTED LAPAROSCOPIC SACROCOLPOPEXY;  Surgeon: Crecencio Mc, MD;  Location: [facility];  Service: Urology;  Laterality: N/A;   TUBAL LIGATION     Social History   Tobacco Use   Smoking status: Never    Passive exposure: Past   Smokeless tobacco: Never  Vaping Use   Vaping status: Never Used  Substance Use Topics   Alcohol use: No    Alcohol/week: 0.0 standard drinks of alcohol   Drug use: No   Family History  Problem Relation Age of Onset   Hypertension Mother    Hyperlipidemia Mother    Diabetes Mother    Breast cancer Mother 30   Uterine cancer Mother    GER disease Father    Depression Sister    Heart disease Brother    Heart attack Maternal Grandmother    Hypertension Maternal Grandmother    Colon cancer Neg Hx    Colon polyps Neg Hx    Esophageal cancer Neg Hx    Rectal cancer Neg Hx    Stomach cancer Neg Hx    Allergies  Allergen Reactions   Buspar [Buspirone]     Made her more anxious   Current Outpatient Medications on File Prior to Visit  Medication Sig Dispense Refill   acetaminophen (TYLENOL) 500 MG tablet Take 1 tablet (500 mg total) by mouth every 6 (six) hours as needed (pain). 30 tablet 0   amLODipine (NORVASC) 2.5 MG tablet Take 1 tablet (2.5 mg total) by mouth daily. 90 tablet 3   b complex vitamins capsule Take 1 capsule by mouth daily.     cetirizine (ZYRTEC) 10 MG chewable tablet Chew 10 mg by mouth daily.     esomeprazole (NEXIUM) 20 MG capsule Take 20 mg by mouth 2 (two) times daily before a meal.     estradiol (ESTRACE) 0.1 MG/GM vaginal cream Place 0.5 g vaginally 2 (two) times a week. Place 0.5g nightly for two weeks then twice a week after 30 g 11   fluticasone (FLONASE) 50 MCG/ACT nasal spray Place 2 sprays into both nostrils daily. 16 g 6   ibuprofen (ADVIL) 800 MG tablet TAKE 1 TABLET BY MOUTH EVERY 8 HOURS AS NEEDED FOR PAIN 90 tablet 1   metFORMIN (GLUCOPHAGE) 500 MG tablet Take 1 tablet (500  mg total) by mouth daily with breakfast. 90 tablet 0   sertraline (ZOLOFT) 25 MG tablet Take 1 tablet (25 mg total) by mouth daily. 90 tablet 3   Vitamin D, Cholecalciferol, 25 MCG (1000 UT) TABS Take 5,000 Units by mouth daily.     No current facility-administered medications on file prior to visit.    Review of  Systems  Constitutional:  Negative for activity change, appetite change, fatigue, fever and unexpected weight change.  HENT:  Negative for congestion, ear pain, rhinorrhea, sinus pressure and sore throat.   Eyes:  Negative for pain, redness and visual disturbance.  Respiratory:  Negative for cough, shortness of breath and wheezing.   Cardiovascular:  Negative for chest pain and palpitations.  Gastrointestinal:  Positive for abdominal pain. Negative for abdominal distention, blood in stool, constipation and diarrhea.       Occational mid abd discomfort since starting metforimin  No diarrhea   Endocrine: Negative for polydipsia and polyuria.  Genitourinary:  Negative for dysuria, frequency and urgency.  Musculoskeletal:  Negative for arthralgias, back pain and myalgias.  Skin:  Negative for pallor and rash.       Itching without rash  Allergic/Immunologic: Negative for environmental allergies.  Neurological:  Negative for dizziness, syncope and headaches.  Hematological:  Negative for adenopathy. Does not bruise/bleed easily.  Psychiatric/Behavioral:  Negative for decreased concentration and dysphoric mood. The patient is not nervous/anxious.        Mood is good Does not feel anxious lately       Objective:   Physical Exam Constitutional:      General: She is not in acute distress.    Appearance: Normal appearance. She is well-developed. She is obese. She is not ill-appearing or diaphoretic.  HENT:     Head: Normocephalic and atraumatic.     Mouth/Throat:     Comments:  No mouth or throat swelling  Eyes:     General: No scleral icterus.    Conjunctiva/sclera:  Conjunctivae normal.     Pupils: Pupils are equal, round, and reactive to light.  Neck:     Thyroid: No thyromegaly.     Vascular: No carotid bruit or JVD.  Cardiovascular:     Rate and Rhythm: Normal rate and regular rhythm.     Heart sounds: Normal heart sounds.     No gallop.  Pulmonary:     Effort: Pulmonary effort is normal. No respiratory distress.     Breath sounds: Normal breath sounds. No stridor. No wheezing, rhonchi or rales.  Abdominal:     General: Abdomen is protuberant. Bowel sounds are normal. There is no distension or abdominal bruit.     Palpations: Abdomen is soft. There is no shifting dullness, hepatomegaly, splenomegaly, mass or pulsatile mass.     Tenderness: There is abdominal tenderness in the right upper quadrant and epigastric area. There is no right CVA tenderness, left CVA tenderness, guarding or rebound. Negative signs include Murphy's sign and McBurney's sign.     Hernia: No hernia is present.     Comments: Very mild epigastric and RUQ tenderness to deep palpation only    Musculoskeletal:     Cervical back: Normal range of motion and neck supple.     Right lower leg: No edema.     Left lower leg: No edema.  Lymphadenopathy:     Cervical: No cervical adenopathy.  Skin:    General: Skin is warm and dry.     Coloration: Skin is not jaundiced or pale.     Findings: No bruising, erythema, lesion or rash.  Neurological:     Mental Status: She is alert.     Coordination: Coordination normal.     Deep Tendon Reflexes: Reflexes are normal and symmetric. Reflexes normal.  Psychiatric:        Mood and Affect: Mood normal.  Assessment & Plan:   Problem List Items Addressed This Visit       Cardiovascular and Mediastinum   Essential hypertension    bp in fair control at this time  BP Readings from Last 1 Encounters:  02/22/23 114/66   No changes needed Most recent labs reviewed  Disc lifstyle change with low sodium diet and exercise   Plan to continue amlodipine 2.5 mg daily       Relevant Orders   Basic metabolic panel   Hepatic function panel   CBC with Differential     Digestive   Hepatic steatosis    Pt has had recent itching and no rash  No med changes  Has been working hard on weight loss-down 14 lb per pt  Labs today        Musculoskeletal and Integument   Pruritus - Primary    This is different than last time/ more widespread and without feeling of anxiety  This occurred a week after beach trip /did swim in ocean  No rash at all  Has history of hepatic steatosis Reassuring exam  Does get occational abd discomfort from metformin and plans to discussed with her weight loss specialist   Lab today incl bmet and liver and cbc Discussed using scent free products Watch for rash  Avoid heat /hot water  Continue zyrtec 10 mg daily and change to bedtime dosing Call back and Er precautions noted in detail today        Relevant Orders   Basic metabolic panel   Hepatic function panel   CBC with Differential

## 2023-02-23 LAB — HEPATIC FUNCTION PANEL
AG Ratio: 1.7 (calc) (ref 1.0–2.5)
ALT: 13 U/L (ref 6–29)
AST: 12 U/L (ref 10–35)
Albumin: 4 g/dL (ref 3.6–5.1)
Alkaline phosphatase (APISO): 106 U/L (ref 37–153)
Bilirubin, Direct: 0.1 mg/dL (ref 0.0–0.2)
Globulin: 2.4 g/dL (calc) (ref 1.9–3.7)
Indirect Bilirubin: 0.3 mg/dL (calc) (ref 0.2–1.2)
Total Bilirubin: 0.4 mg/dL (ref 0.2–1.2)
Total Protein: 6.4 g/dL (ref 6.1–8.1)

## 2023-02-23 LAB — BASIC METABOLIC PANEL
BUN: 11 mg/dL (ref 7–25)
CO2: 26 mmol/L (ref 20–32)
Calcium: 9.5 mg/dL (ref 8.6–10.4)
Chloride: 102 mmol/L (ref 98–110)
Creat: 0.89 mg/dL (ref 0.50–1.05)
Glucose, Bld: 97 mg/dL (ref 65–99)
Potassium: 4.2 mmol/L (ref 3.5–5.3)
Sodium: 138 mmol/L (ref 135–146)

## 2023-02-23 LAB — CBC WITH DIFFERENTIAL/PLATELET
HCT: 43.1 % (ref 35.0–45.0)
Hemoglobin: 13.8 g/dL (ref 11.7–15.5)
Lymphs Abs: 2278 cells/uL (ref 850–3900)
MCH: 25.5 pg — ABNORMAL LOW (ref 27.0–33.0)
Neutro Abs: 7176 cells/uL (ref 1500–7800)
Neutrophils Relative %: 69 %
Platelets: 189 10*3/uL (ref 140–400)

## 2023-02-24 ENCOUNTER — Encounter: Payer: Self-pay | Admitting: Family Medicine

## 2023-02-25 ENCOUNTER — Ambulatory Visit (INDEPENDENT_AMBULATORY_CARE_PROVIDER_SITE_OTHER): Payer: Managed Care, Other (non HMO) | Admitting: Internal Medicine

## 2023-02-28 ENCOUNTER — Encounter (INDEPENDENT_AMBULATORY_CARE_PROVIDER_SITE_OTHER): Payer: Self-pay | Admitting: Family Medicine

## 2023-02-28 ENCOUNTER — Ambulatory Visit (INDEPENDENT_AMBULATORY_CARE_PROVIDER_SITE_OTHER): Payer: Managed Care, Other (non HMO) | Admitting: Family Medicine

## 2023-02-28 VITALS — BP 119/81 | HR 86 | Temp 97.8°F | Ht 64.0 in | Wt 202.0 lb

## 2023-02-28 DIAGNOSIS — Z6834 Body mass index (BMI) 34.0-34.9, adult: Secondary | ICD-10-CM

## 2023-02-28 DIAGNOSIS — R7303 Prediabetes: Secondary | ICD-10-CM | POA: Diagnosis not present

## 2023-02-28 DIAGNOSIS — E7849 Other hyperlipidemia: Secondary | ICD-10-CM | POA: Diagnosis not present

## 2023-02-28 DIAGNOSIS — R140 Abdominal distension (gaseous): Secondary | ICD-10-CM | POA: Insufficient documentation

## 2023-02-28 DIAGNOSIS — R5383 Other fatigue: Secondary | ICD-10-CM | POA: Diagnosis not present

## 2023-02-28 NOTE — Assessment & Plan Note (Signed)
Lab Results  Component Value Date   HGBA1C 6.0 (H) 10/24/2022   Doing well on metformin 500 mg once daily with breakfast.  Her complaints of early satiety, slight nausea and abdominal pain did not initiate with the introduction of metformin.  These have been more recent and do not seem to be worsened by metformin use.  She is actively working on a low sugar/low starch diet, regular exercise and weight reduction.  Recheck labs today

## 2023-02-28 NOTE — Assessment & Plan Note (Signed)
She has complained of general feelings of malaise, fatigue, abdominal bloating, right flank pain and early satiety for the past month following a beach trip.  She denies any acute illness, insect bite or fevers.  She has an upcoming visit to see her PCP and recent labs were reviewed.  Will update additional labs today looking for thyroid dysfunction, iron deficiency and B12 deficiency. Work on Designer, fashion/clothing well with water and sugar-free electrolyte drinks

## 2023-02-28 NOTE — Progress Notes (Signed)
Office: [phone]  /  Fax: [phone]  WEIGHT SUMMARY AND BIOMETRICS  Starting Date: 10/24/22  Starting Weight: 220lb   Weight Lost Since Last Visit: 4lb   Vitals Temp: 97.8 F (36.6 C) BP: 119/81 Pulse Rate: 86 SpO2: 97 %   Body Composition  Body Fat %: 43.7 % Fat Mass (lbs): 88.4 lbs Muscle Mass (lbs): 108.2 lbs Total Body Water (lbs): 74.4 lbs Visceral Fat Rating : 13     HPI  Chief Complaint: OBESITY  Makayla [name] is here to discuss her progress with her obesity treatment plan. She is on the the Category 2 Plan and states she is following her eating plan approximately 75 % of the time. She states she is exercising 0 minutes 0 times per week.   Interval History:  Since last office visit she is down 4 lb She is up 0.8 lb of muscle and is down 5 lb of body fat in the past month She has not been feeling as well lately, having a hard time getting in her meals and has been doing less exercise  She has some slight nausea and R flank pain She has not had blood in stool or urine Denies fevers Has had heartburn, bloating-- seeing PCP tomorrow  She has a net weight loss of 18 lb in the past 6 mos of medically supervised weight management This is an 8.1% total body weight loss  Pharmacotherapy: Metformin 500 mg once daily for prediabetes  PHYSICAL EXAM:  Blood pressure 119/81, pulse 86, temperature 97.8 F (36.6 C), height 5\' 4"  (1.626 m), weight 202 lb (91.6 kg), SpO2 97%. Body mass index is 34.67 kg/m.  General: She is overweight, cooperative, alert, well developed, and in no acute distress. PSYCH: Has normal mood, affect and thought process.   Lungs: Normal breathing effort, no conversational dyspnea.   ASSESSMENT AND PLAN  TREATMENT PLAN FOR OBESITY:  Recommended Dietary Goals  Makayla [name] is currently in the action stage of change. As such, her goal is to continue weight management plan. She has agreed to the Category 2 Plan. -She may supplement with a  protein powder, unsweetened nondairy milk and frozen fruit in place of a meal due to lack of hunger  Behavioral Intervention  We discussed the following Behavioral Modification Strategies today: increasing lean protein intake, decreasing simple carbohydrates , increasing vegetables, increasing lower glycemic fruits, increasing fiber rich foods, increasing water intake, keeping healthy foods at home, continue to work on implementation of reduced calorie nutritional plan, continue to practice mindfulness when eating, and planning for success.  Additional resources provided today: NA  Recommended Physical Activity Goals  Makayla [name] has been advised to work up to 150 minutes of moderate intensity aerobic activity a week and strengthening exercises 2-3 times per week for cardiovascular health, weight loss maintenance and preservation of muscle mass.   She has agreed to Think about ways to increase daily physical activity and overcoming barriers to exercise - reviewed use of resistance bands for use 2-3 x a week  Pharmacotherapy changes for the treatment of obesity: None  ASSOCIATED CONDITIONS ADDRESSED TODAY  Other fatigue Assessment & Plan: She has complained of general feelings of malaise, fatigue, abdominal bloating, right flank pain and early satiety for the past month following a beach trip.  She denies any acute illness, insect bite or fevers.  She has an upcoming visit to see her PCP and recent labs were reviewed.  Will update additional labs today looking for thyroid dysfunction, iron deficiency and B12  deficiency. Work on Designer, fashion/clothing well with water and sugar-free electrolyte drinks  Orders: -     Ferritin -     Iron and TIBC -     Vitamin B12 -     TSH Rfx on Abnormal to Free T4  Other hyperlipidemia Assessment & Plan: Lab Results  Component Value Date   CHOL 227 (H) 10/24/2022   HDL 56 10/24/2022   LDLCALC 146 (H) 10/24/2022   TRIG 139 10/24/2022   CHOLHDL 4.1 10/24/2022    Currently not on any medication for cholesterol.  Patient has lost 8% total body weight in the last 6 months of medically supervised weight management using a low saturated fat diet.  Recheck lipid panel today.  Orders: -     Lipid panel  Pre-diabetes Assessment & Plan: Lab Results  Component Value Date   HGBA1C 6.0 (H) 10/24/2022   Doing well on metformin 500 mg once daily with breakfast.  Her complaints of early satiety, slight nausea and abdominal pain did not initiate with the introduction of metformin.  These have been more recent and do not seem to be worsened by metformin use.  She is actively working on a low sugar/low starch diet, regular exercise and weight reduction.  Recheck labs today  Orders: -     Hemoglobin A1c  Morbid obesity (HCC) with starting BMI 37  BMI 34.0-34.9,adult      She was informed of the importance of frequent follow up visits to maximize her success with intensive lifestyle modifications for her multiple health conditions.   ATTESTASTION STATEMENTS:  Reviewed by clinician on day of visit: allergies, medications, problem list, medical history, surgical history, family history, social history, and previous encounter notes pertinent to obesity diagnosis.   I have personally spent 30 minutes total time today in preparation, patient care, nutritional counseling and documentation for this visit, including the following: review of clinical lab tests; review of medical tests/procedures/services.      Makayla Brink, DO DABFM, DABOM [facility] [address] [place], [place] [address] [phone]

## 2023-02-28 NOTE — Assessment & Plan Note (Signed)
Lab Results  Component Value Date   CHOL 227 (H) 10/24/2022   HDL 56 10/24/2022   LDLCALC 146 (H) 10/24/2022   TRIG 139 10/24/2022   CHOLHDL 4.1 10/24/2022   Currently not on any medication for cholesterol.  Patient has lost 8% total body weight in the last 6 months of medically supervised weight management using a low saturated fat diet.  Recheck lipid panel today.

## 2023-03-01 ENCOUNTER — Ambulatory Visit (INDEPENDENT_AMBULATORY_CARE_PROVIDER_SITE_OTHER)
Admission: RE | Admit: 2023-03-01 | Discharge: 2023-03-01 | Disposition: A | Discharge status: Home / Self Care | Payer: Managed Care, Other (non HMO) | Source: Ambulatory Visit | Attending: Family Medicine | Admitting: Family Medicine

## 2023-03-01 ENCOUNTER — Ambulatory Visit (INDEPENDENT_AMBULATORY_CARE_PROVIDER_SITE_OTHER): Payer: Managed Care, Other (non HMO) | Admitting: Family Medicine

## 2023-03-01 ENCOUNTER — Encounter: Payer: Self-pay | Admitting: Family Medicine

## 2023-03-01 VITALS — BP 122/78 | HR 83 | Temp 97.6°F | Ht 64.0 in | Wt 207.1 lb

## 2023-03-01 DIAGNOSIS — R109 Unspecified abdominal pain: Secondary | ICD-10-CM

## 2023-03-01 DIAGNOSIS — L299 Pruritus, unspecified: Secondary | ICD-10-CM

## 2023-03-01 DIAGNOSIS — K760 Fatty (change of) liver, not elsewhere classified: Secondary | ICD-10-CM

## 2023-03-01 DIAGNOSIS — R1012 Left upper quadrant pain: Secondary | ICD-10-CM

## 2023-03-01 DIAGNOSIS — K219 Gastro-esophageal reflux disease without esophagitis: Secondary | ICD-10-CM | POA: Diagnosis not present

## 2023-03-01 LAB — BASIC METABOLIC PANEL
BUN: 10 mg/dL (ref 6–23)
CO2: 27 mEq/L (ref 19–32)
Calcium: 9.6 mg/dL (ref 8.4–10.5)
Chloride: 100 mEq/L (ref 96–112)
Creatinine, Ser: 0.86 mg/dL (ref 0.40–1.20)
GFR: 72.14 mL/min (ref >60.00)
Glucose, Bld: 97 mg/dL (ref 70–99)
Potassium: 4 mEq/L (ref 3.5–5.1)
Sodium: 136 mEq/L (ref 135–145)

## 2023-03-01 LAB — CBC WITH DIFFERENTIAL/PLATELET
Basophils Absolute: 0.1 10*3/uL (ref 0.0–0.1)
Basophils Relative: 0.9 % (ref 0.0–3.0)
Eosinophils Absolute: 0.2 10*3/uL (ref 0.0–0.7)
Eosinophils Relative: 1.8 % (ref 0.0–5.0)
HCT: 42 % (ref 36.0–46.0)
Hemoglobin: 13.6 g/dL (ref 12.0–15.0)
Lymphocytes Relative: 23.9 % (ref 12.0–46.0)
Lymphs Abs: 2 10*3/uL (ref 0.7–4.0)
MCHC: 32.4 g/dL (ref 30.0–36.0)
MCV: 79.4 fl (ref 78.0–100.0)
Monocytes Absolute: 0.7 10*3/uL (ref 0.1–1.0)
Monocytes Relative: 7.8 % (ref 3.0–12.0)
Neutro Abs: 5.5 10*3/uL (ref 1.4–7.7)
Neutrophils Relative %: 65.6 % (ref 43.0–77.0)
Platelets: 181 10*3/uL (ref 150.0–400.0)
RBC: 5.29 Mil/uL — ABNORMAL HIGH (ref 3.87–5.11)
RDW: 16.1 % — ABNORMAL HIGH (ref 11.5–15.5)
WBC: 8.4 10*3/uL (ref 4.0–10.5)

## 2023-03-01 LAB — POC URINALSYSI DIPSTICK (AUTOMATED)
Bilirubin, UA: NEGATIVE
Blood, UA: NEGATIVE
Glucose, UA: NEGATIVE
Ketones, UA: NEGATIVE
Leukocytes, UA: NEGATIVE
Nitrite, UA: NEGATIVE
Protein, UA: NEGATIVE
Spec Grav, UA: <=1.005 (ref 1.010–1.025)
Urobilinogen, UA: 0.2 E.U./dL
pH, UA: 6 (ref 5.0–8.0)

## 2023-03-01 LAB — HEPATIC FUNCTION PANEL
ALT: 12 U/L (ref 0–35)
AST: 11 U/L (ref 0–37)
Albumin: 4.2 g/dL (ref 3.5–5.2)
Alkaline Phosphatase: 96 U/L (ref 39–117)
Bilirubin, Direct: 0 mg/dL (ref 0.0–0.3)
Total Bilirubin: 0.3 mg/dL (ref 0.2–1.2)
Total Protein: 6.8 g/dL (ref 6.0–8.3)

## 2023-03-01 LAB — LIPASE: Lipase: 16 U/L (ref 11.0–59.0)

## 2023-03-01 NOTE — Assessment & Plan Note (Addendum)
Lateral/ back Some malaise and nausea -unsure if related (also indigestion) Some tenderness over right posterior/lower ribs  No right abd tenderness and neg murphy sign  Urinalysis clear   KUB - stool burden  CXR- no acute changes  Reassuring

## 2023-03-01 NOTE — Assessment & Plan Note (Signed)
Resolved

## 2023-03-01 NOTE — Assessment & Plan Note (Addendum)
Ongoing  Has seen GI Pt thinks she has lipoma in area - difficult to tell  Some tenderness in LUQ and LLq today  Known diverticulosis   Lab today   Low threshold to image if not improved  Last CT (June 2023) was reassuring -reviewed today KUB today notes moderate stool burden and no signs of obst

## 2023-03-01 NOTE — Assessment & Plan Note (Signed)
A bit worse lately- indigestion  Is eating better and [facility]  Takes nexium 20 mg bid  Has seen GI in the past (notes reviewed)   Some chronic /unchanged LUQ tenderness (pt thinks she has a lipoma there)

## 2023-03-01 NOTE — Assessment & Plan Note (Signed)
Liver tests today  Commended on weight loss (intentional)  Ccy in past  Recent bout of itching is resolved

## 2023-03-01 NOTE — Progress Notes (Signed)
Subjective:    Patient ID: Makayla [name], female    DOB: [birth year], 62 y.o.   MRN: [id number]  HPI  Wt Readings from Last 3 Encounters:  03/01/23 207 lb 2 oz (94 kg)  02/28/23 202 lb (91.6 kg)  02/22/23 208 lb 6 oz (94.5 kg)   35.55 kg/m  Vitals:   03/01/23 0816  BP: 122/78  Pulse: 83  Temp: 97.6 F (36.4 C)  SpO2: 99%   Pt presents with right flank pain and pressure   Pain in right side and back  Pain Monday Pressure Tuesday  Has not felt well for 2-3 d   In past has always had some pain after she eats both sides   Has had ccy   Some nausea -very unuaual  Some heartburn- takes nexium bid   No fever No diarrhea  No constipation   Chronic left sided upper abd pain  Thinks she has a lipoma there - has d/w GI and derm  Derm appt coming up    Ate some sunflower seeds on Saturday   06/2022 colonoscopy with tics   Urinalysis is clear  Results for orders placed or performed in visit on 03/01/23  POCT Urinalysis Dipstick (Automated)  Result Value Ref Range   Color, UA Yellow    Clarity, UA Clear    Glucose, UA Negative Negative   Bilirubin, UA Negative    Ketones, UA Negative    Spec Grav, UA <=1.005 1.010 - 1.025   Blood, UA Negative    pH, UA 6.0 5.0 - 8.0   Protein, UA Negative Negative   Urobilinogen, UA 0.2 0.2 or 1.0 E.U./dL   Nitrite, UA Negative    Leukocytes, UA Negative Negative       Last CT abd/pel was 01/2022 IMPRESSION: 1. Colonic diverticulosis without evidence superimposed acute diverticulitis. 2. Moderate colonic stool burden without evidence of enteric obstruction. 3. Small hiatal hernia. 4. Hepatic steatosis.  Correlation with LFTs is advised.  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  Planning next one at 5 years   Lab Results  Component Value Date   NA 138 02/22/2023   K 4.2 02/22/2023   CO2 26 02/22/2023   GLUCOSE 97 02/22/2023   BUN 11 02/22/2023   CREATININE 0.89 02/22/2023   CALCIUM 9.5 02/22/2023   GFR 74.59  06/08/2022   EGFR 80 10/24/2022   GFRNONAA >60 01/30/2022   Lab Results  Component Value Date   ALT 13 02/22/2023   AST 12 02/22/2023   ALKPHOS 123 (H) 10/24/2022   BILITOT 0.4 02/22/2023   Lab Results  Component Value Date   WBC 10.4 02/22/2023   HGB 13.8 02/22/2023   HCT 43.1 02/22/2023   MCV 79.7 (L) 02/22/2023   PLT 189 02/22/2023    Xrays today DG Abd 1 View  Result Date: 03/01/2023 CLINICAL DATA:  Acute on chronic pain in the left upper abdomen. Today also right flank pain. EXAM: ABDOMEN - 1 VIEW COMPARISON:  CT abdomen and pelvis 02/02/2022 FINDINGS: Stool is seen throughout the ascending, transverse, descending, and sigmoid colon. Overall paucity of small bowel gas. No dilated loops of bowel are seen to indicate bowel obstruction. No portal venous gas or pneumatosis. When correlating with contemporaneous AP view of the chest, cholecystectomy clips are seen. No subdiaphragmatic free air is seen. Minimal levocurvature centered at L2-3. Mild multilevel degenerative disc changes of the lumbar spine. IMPRESSION: 1. Nonobstructive bowel gas pattern. 2. Moderate stool burden. Electronically Signed   By: Windy Fast  Viola M.D.   On: 03/01/2023 09:37   DG Chest 2 View  Result Date: 03/01/2023 CLINICAL DATA:  Posterior right flank pain. Some tenderness over the left lateral ribs. EXAM: CHEST - 2 VIEW COMPARISON:  Chest radiographs 01/30/2022 FINDINGS: Cardiac silhouette and mediastinal contours are within normal limits. The lungs are clear. No pleural effusion or pneumothorax. Mild multilevel degenerative disc changes of the thoracic spine. Cholecystectomy clips. IMPRESSION: No active cardiopulmonary disease. Electronically Signed   By: Neita Garnet M.D.   On: 03/01/2023 09:34      Patient Active Problem List   Diagnosis Date Noted   Right flank pain 03/01/2023   Osteoarthritis, knee 11/28/2022   Bilateral knee pain 11/23/2022   Hepatic steatosis 11/07/2022   Other fatigue 10/24/2022    SOBOE (shortness of breath on exertion) 10/24/2022   GAD (generalized anxiety disorder) 10/24/2022   Pre-diabetes 10/24/2022   Other hyperlipidemia 10/24/2022   Depression screen 10/24/2022   Elevated glucose 07/18/2022   Hemorrhoids 06/07/2022   Abdominal pain, left upper quadrant 02/01/2022   Rib pain on left side 06/13/2020   Colon cancer screening 05/06/2020   Insomnia, psychophysiological 03/14/2020   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   Retro-orbital pain of left eye 03/14/2020   Pain head 02/25/2020   Pulsatile tinnitus of right ear 09/30/2019   Immunization reaction 09/08/2019   Grief reaction 10/03/2018   Elevated glucose level 01/16/2018   Generalized anxiety disorder 01/23/2017   Vitamin D deficiency 05/20/2015   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   Morbid obesity (HCC) with starting BMI 37 10/25/2011   Routine general medical examination at a health care facility 10/16/2011   Hyperlipidemia LDL goal <130 12/04/2007   GERD 12/04/2007   Essential hypertension 12/04/2007   Past Medical History:  Diagnosis Date   Allergy    pollen   Anxiety    Bursitis, trochanteric    bi/lat    Dental crowns present    x 4   Diverticulosis    Fatty liver    GERD (gastroesophageal reflux disease)    Hyperlipidemia    no current med.   Hypertension    white coat syndrome,   Mass of breast, left 01/2012   Obesity    Palpitations    PONV (postoperative nausea and vomiting)    Pre-diabetes    Pre-diabetes    Vitamin D deficiency    Past Surgical History:  Procedure Laterality Date   ABDOMINAL HYSTERECTOMY     partial   ANTERIOR AND POSTERIOR REPAIR WITH SACROSPINOUS FIXATION N/A 08/20/2022   Procedure: POSTERIOR REPAIR WITH PERINEORRHAPHY AND WITH  SACROSPINOUS FIXATION;  Surgeon: Marguerita Beards, MD;  Location: [facility];  Service: Gynecology;  Laterality: N/A;   BREAST BIOPSY Right 08/04/2021   BREAST  EXCISIONAL BIOPSY Left 2013   BREAST SURGERY  01/10/2012   lumpectomy - left (excisional biopsy benign)   CHOLECYSTECTOMY N/A 02/10/2016   Procedure: LAPAROSCOPIC CHOLECYSTECTOMY WITH INTRAOPERATIVE CHOLANGIOGRAM;  Surgeon: Chevis Pretty III, MD;  Location: [facility];  Service: General;  Laterality: N/A;   COLONOSCOPY  04/06/2010   D Brodie, normal w/tics   RECTOCELE REPAIR  06/14/2011   cystocele repair, pt states Dr Jacquelyne Balint did not do rectocele and said she had to come back later.   ROBOTIC ASSISTED LAPAROSCOPIC SACROCOLPOPEXY  06/14/2011   Procedure: ROBOTIC ASSISTED LAPAROSCOPIC SACROCOLPOPEXY;  Surgeon: Crecencio Mc, MD;  Location: [facility];  Service: Urology;  Laterality: N/A;   TUBAL LIGATION  Social History   Tobacco Use   Smoking status: Never    Passive exposure: Past   Smokeless tobacco: Never  Vaping Use   Vaping status: Never Used  Substance Use Topics   Alcohol use: No    Alcohol/week: 0.0 standard drinks of alcohol   Drug use: No   Family History  Problem Relation Age of Onset   Hypertension Mother    Hyperlipidemia Mother    Diabetes Mother    Breast cancer Mother 68   Uterine cancer Mother    GER disease Father    Depression Sister    Heart disease Brother    Heart attack Maternal Grandmother    Hypertension Maternal Grandmother    Colon cancer Neg Hx    Colon polyps Neg Hx    Esophageal cancer Neg Hx    Rectal cancer Neg Hx    Stomach cancer Neg Hx    Allergies  Allergen Reactions   Buspar [Buspirone]     Made her more anxious   Current Outpatient Medications on File Prior to Visit  Medication Sig Dispense Refill   acetaminophen (TYLENOL) 500 MG tablet Take 1 tablet (500 mg total) by mouth every 6 (six) hours as needed (pain). 30 tablet 0   amLODipine (NORVASC) 2.5 MG tablet Take 1 tablet (2.5 mg total) by mouth daily. 90 tablet 3   b complex vitamins capsule Take 1 capsule by mouth daily.     esomeprazole (NEXIUM) 20 MG capsule Take 20 mg by mouth 2  (two) times daily before a meal.     estradiol (ESTRACE) 0.1 MG/GM vaginal cream Place 0.5 g vaginally 2 (two) times a week. Place 0.5g nightly for two weeks then twice a week after 30 g 11   fluticasone (FLONASE) 50 MCG/ACT nasal spray Place 2 sprays into both nostrils daily. 16 g 6   ibuprofen (ADVIL) 800 MG tablet TAKE 1 TABLET BY MOUTH EVERY 8 HOURS AS NEEDED FOR PAIN 90 tablet 1   loratadine (CLARITIN) 10 MG tablet Take 10 mg by mouth daily as needed for allergies.     metFORMIN (GLUCOPHAGE) 500 MG tablet Take 1 tablet (500 mg total) by mouth daily with breakfast. 90 tablet 0   sertraline (ZOLOFT) 25 MG tablet Take 1 tablet (25 mg total) by mouth daily. 90 tablet 3   Vitamin D, Cholecalciferol, 25 MCG (1000 UT) TABS Take 5,000 Units by mouth daily.     No current facility-administered medications on file prior to visit.    Review of Systems  Constitutional:  Positive for fatigue. Negative for activity change, appetite change, diaphoresis, fever and unexpected weight change.  HENT:  Negative for congestion, ear pain, rhinorrhea, sinus pressure and sore throat.   Eyes:  Negative for pain, redness and visual disturbance.  Respiratory:  Negative for cough, shortness of breath and wheezing.   Cardiovascular:  Negative for chest pain and palpitations.  Gastrointestinal:  Positive for abdominal distention, abdominal pain and nausea. Negative for anal bleeding, blood in stool, constipation, diarrhea, rectal pain and vomiting.  Endocrine: Negative for polydipsia and polyuria.  Genitourinary:  Negative for dysuria, frequency, hematuria, pelvic pain and urgency.       Right flank pain   Musculoskeletal:  Negative for arthralgias, back pain and myalgias.  Skin:  Negative for pallor and rash.  Allergic/Immunologic: Negative for environmental allergies.  Neurological:  Negative for dizziness, syncope and headaches.  Hematological:  Negative for adenopathy. Does not bruise/bleed easily.   Psychiatric/Behavioral:  Negative for  decreased concentration and dysphoric mood. The patient is not nervous/anxious.        Objective:   Physical Exam Constitutional:      General: She is not in acute distress.    Appearance: Normal appearance. She is well-developed. She is obese. She is not ill-appearing or diaphoretic.  HENT:     Head: Normocephalic and atraumatic.  Eyes:     Conjunctiva/sclera: Conjunctivae normal.     Pupils: Pupils are equal, round, and reactive to light.  Neck:     Thyroid: No thyromegaly.     Vascular: No carotid bruit or JVD.  Cardiovascular:     Rate and Rhythm: Normal rate and regular rhythm.     Heart sounds: Normal heart sounds.     No gallop.  Pulmonary:     Effort: Pulmonary effort is normal. No respiratory distress.     Breath sounds: Normal breath sounds. No stridor. No wheezing, rhonchi or rales.     Comments: Some tenderness of right lower/posterior ribs No step off of crepitus or skin change  Chest:     Chest wall: Tenderness present.  Abdominal:     General: Abdomen is protuberant. Bowel sounds are normal. There is no distension or abdominal bruit.     Palpations: Abdomen is soft. There is no shifting dullness, fluid wave, hepatomegaly, splenomegaly, mass or pulsatile mass.     Tenderness: There is abdominal tenderness in the right upper quadrant, left upper quadrant and left lower quadrant. There is no right CVA tenderness, left CVA tenderness, guarding or rebound. Negative signs include Murphy's sign and McBurney's sign.     Hernia: No hernia is present.  Musculoskeletal:     Cervical back: Normal range of motion and neck supple.     Right lower leg: No edema.     Left lower leg: No edema.  Lymphadenopathy:     Cervical: No cervical adenopathy.  Skin:    General: Skin is warm and dry.     Coloration: Skin is not jaundiced or pale.     Findings: No bruising or rash.  Neurological:     Mental Status: She is alert.     Coordination:  Coordination normal.     Deep Tendon Reflexes: Reflexes are normal and symmetric. Reflexes normal.  Psychiatric:        Mood and Affect: Mood normal.           Assessment & Plan:   Problem List Items Addressed This Visit       Digestive   Hepatic steatosis    Liver tests today  Commended on weight loss (intentional)  Ccy in past  Recent bout of itching is resolved         Relevant Orders   Hepatic function panel   Lipase   GERD    A bit worse lately- indigestion  Is eating better and [facility] weight [facility]  Takes nexium 20 mg bid  Has seen GI in the past (notes reviewed)   Some chronic /unchanged LUQ tenderness (pt thinks she has a lipoma there)           Musculoskeletal and Integument   RESOLVED: Pruritus    Resolved         Other   Right flank pain    Lateral/ back Some malaise and nausea -unsure if related (also indigestion) Some tenderness over right posterior/lower ribs  No right abd tenderness and neg murphy sign  Urinalysis clear   KUB -  stool burden  CXR- no acute changes  Reassuring         Relevant Orders   CBC with Differential/Platelet   Basic metabolic panel   Hepatic function panel   Lipase   DG Abd 1 View (Completed)   DG Chest 2 View (Completed)   Abdominal pain, left upper quadrant    Ongoing  Has seen GI Pt thinks she has lipoma in area - difficult to tell  Some tenderness in LUQ and LLq today  Known diverticulosis   Lab today   Low threshold to image if not improved  Last CT (June 2023) was reassuring -reviewed today KUB today notes moderate stool burden and no signs of obst      Other Visit Diagnoses     Flank pain    -  Primary   Relevant Orders   POCT Urinalysis Dipstick (Automated) (Completed)

## 2023-03-01 NOTE — Patient Instructions (Addendum)
Labs today   Abdominal and chest xray today   If symptoms worsen let us know  Watch for fever or more intense abdominal pain or anything new   If anything severe- go to the ER  Also watch for a rash around where the pain is

## 2023-03-04 ENCOUNTER — Telehealth: Payer: Self-pay | Admitting: Family Medicine

## 2023-03-04 NOTE — Telephone Encounter (Signed)
Pt called returning Jessica's call regarding results. Pt states her symptoms, including flank pain, has improved. Pt had no questions/concerns. Call back # [phone]

## 2023-03-04 NOTE — Telephone Encounter (Signed)
Result note has been updated and sent to Dr. Milinda Antis with update.

## 2023-03-06 ENCOUNTER — Ambulatory Visit: Payer: Managed Care, Other (non HMO) | Admitting: Dermatology

## 2023-03-07 ENCOUNTER — Encounter: Payer: Managed Care, Other (non HMO) | Attending: Obstetrics and Gynecology | Admitting: Physical Therapy

## 2023-03-07 ENCOUNTER — Encounter: Payer: Self-pay | Admitting: Physical Therapy

## 2023-03-07 DIAGNOSIS — M6281 Muscle weakness (generalized): Secondary | ICD-10-CM | POA: Insufficient documentation

## 2023-03-07 DIAGNOSIS — R279 Unspecified lack of coordination: Secondary | ICD-10-CM | POA: Diagnosis present

## 2023-03-07 NOTE — Therapy (Signed)
OUTPATIENT PHYSICAL THERAPY FEMALE PELVIC TREATMENT   Patient Name: Makayla [name] MRN: [id number] DOB:1961-03-15, 62 y.o., female Today's Date: 03/07/2023  END OF SESSION:  PT End of Session - 03/07/23 0835     Visit Number 5    Date for PT Re-Evaluation 04/09/23    Authorization Type [employer]    Authorization - Visit Number 5    Authorization - Number of Visits 30    PT Start Time 0830    PT Stop Time 0915    PT Time Calculation (min) 45 min    Activity Tolerance Patient tolerated treatment well    Behavior During Therapy WFL for tasks assessed/performed             Past Medical History:  Diagnosis Date   Allergy    pollen   Anxiety    Bursitis, trochanteric    bi/lat    Dental crowns present    x 4   Diverticulosis    Fatty liver    GERD (gastroesophageal reflux disease)    Hyperlipidemia    no current med.   Hypertension    white coat syndrome,   Mass of breast, left 01/2012   Obesity    Palpitations    PONV (postoperative nausea and vomiting)    Pre-diabetes    Pre-diabetes    Vitamin D deficiency    Past Surgical History:  Procedure Laterality Date   ABDOMINAL HYSTERECTOMY     partial   ANTERIOR AND POSTERIOR REPAIR WITH SACROSPINOUS FIXATION N/A 08/20/2022   Procedure: POSTERIOR REPAIR WITH PERINEORRHAPHY AND WITH  SACROSPINOUS FIXATION;  Surgeon: Marguerita Beards, MD;  Location: [facility];  Service: Gynecology;  Laterality: N/A;   BREAST BIOPSY Right 08/04/2021   BREAST EXCISIONAL BIOPSY Left 2013   BREAST SURGERY  01/10/2012   lumpectomy - left (excisional biopsy benign)   CHOLECYSTECTOMY N/A 02/10/2016   Procedure: LAPAROSCOPIC CHOLECYSTECTOMY WITH INTRAOPERATIVE CHOLANGIOGRAM;  Surgeon: Chevis Pretty III, MD;  Location: [facility];  Service: General;  Laterality: N/A;   COLONOSCOPY  04/06/2010   D Brodie, normal w/tics   RECTOCELE REPAIR  06/14/2011   cystocele repair, pt states Dr Jacquelyne Balint did not do rectocele and said she had to  come back later.   ROBOTIC ASSISTED LAPAROSCOPIC SACROCOLPOPEXY  06/14/2011   Procedure: ROBOTIC ASSISTED LAPAROSCOPIC SACROCOLPOPEXY;  Surgeon: Crecencio Mc, MD;  Location: [facility];  Service: Urology;  Laterality: N/A;   TUBAL LIGATION     Patient Active Problem List   Diagnosis Date Noted   Right flank pain 03/01/2023   Osteoarthritis, knee 11/28/2022   Bilateral knee pain 11/23/2022   Hepatic steatosis 11/07/2022   Other fatigue 10/24/2022   SOBOE (shortness of breath on exertion) 10/24/2022   GAD (generalized anxiety disorder) 10/24/2022   Pre-diabetes 10/24/2022   Other hyperlipidemia 10/24/2022   Depression screen 10/24/2022   Elevated glucose 07/18/2022   Hemorrhoids 06/07/2022   Abdominal pain, left upper quadrant 02/01/2022   Rib pain on left side 06/13/2020   Colon cancer screening 05/06/2020   Insomnia, psychophysiological 03/14/2020   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   Retro-orbital pain of left eye 03/14/2020   Pain head 02/25/2020   Pulsatile tinnitus of right ear 09/30/2019   Immunization reaction 09/08/2019   Grief reaction 10/03/2018   Elevated glucose level 01/16/2018   Generalized anxiety disorder 01/23/2017   Vitamin D deficiency 05/20/2015   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   Morbid obesity (HCC) with starting  BMI 37 10/25/2011   Routine general medical examination at a health care facility 10/16/2011   Hyperlipidemia LDL goal <130 12/04/2007   GERD 12/04/2007   Essential hypertension 12/04/2007    PCP: Judy Pimple, MD  REFERRING PROVIDER: Marguerita Beards, MD   REFERRING DIAG: N99.3 (ICD-10-CM) - Vaginal vault prolapse after hysterectomy   THERAPY DIAG:  Muscle weakness (generalized)  Unspecified lack of coordination  Rationale for Evaluation and Treatment: Rehabilitation  ONSET DATE: 08/20/22  SUBJECTIVE:                                                                                                                                                                                            SUBJECTIVE STATEMENT: I was able to hold my urine until I get to the bathroom for first time.  Patient does not feel pressure before and after she has a bowel movement for 15-30 minutes.      PAIN:  Are you having pain? No  PRECAUTIONS: avoidance of heavy lifting and straining long term to reduce the risk of recurrence.   WEIGHT BEARING RESTRICTIONS: No  FALLS:  Has patient fallen in last 6 months? 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  PLOF: Independent  PATIENT GOALS: learn how to strengthen her pelvic floor to not have prolapse get worse  PERTINENT HISTORY:  s/p Posterior repair, enterocele repair, perineorrhaphy, sacrospinous ligament fixation on 08/20/22 ; Cholecystectomy 02/10/2016; Rectocele repair  and sacrocolpopexy 06/14/2011;   BOWEL MOVEMENT: Pain with bowel movement: No Type of bowel movement:Type (Bristol Stool Scale) Type 5,6, Frequency daily, Strain No, and Splinting no Fully empty rectum: Yes:   Leakage: No Pads: No Fiber supplement: Yes: Miralax  URINATION: Pain with urination: No Fully empty bladder: Yes:   Stream: Strong Urgency: Yes: at times and not able to wait Frequency: every 2 hours Leakage:  none Pads: No  INTERCOURSE: not active  PREGNANCY: Vaginal deliveries 1 Tearing Yes: episiomty  PROLAPSE: Rectocele     OBJECTIVE:   DIAGNOSTIC FINDINGS:  none    COGNITION: Overall cognitive status: Within functional limits for tasks assessed     SENSATION: Light touch: Appears intact Proprioception: Appears intact   POSTURE: rounded shoulders and forward head  PELVIC ALIGNMENT: correct alignment  LUMBARAROM/PROM: lumbar ROM is full    LOWER EXTREMITY ROM:  Passive ROM Right eval Left eval  Hip external rotation 70 50   (Blank rows = not  tested)  LOWER EXTREMITY MMT:  MMT Right eval Left eval  Hip flexion 4/5 4/5  Hip  extension 3+/5 3+/5  Hip abduction 3+/5 3+/5   PALPATION:   General  rib angle greater than 90 degrees and decreased movement; hysterectomy scar decreased mobility, not able to contract abdominals instead will lift rib cage                External Perineal Exam red in coloring                             Internal Pelvic Floor posterior wall bulge noted to the introitus, tightness on the puborectalis, vaginally patient is not able to hug the therapist finger and more contraction with the levator muscle than the pubovaginalis  Patient confirms identification and approves PT to assess internal pelvic floor and treatment Yes  PELVIC MMT:   MMT eval 01/24/23 03/07/23  Vaginal 2/5 3/5 with weak lift holding for 5 sec and able to hug the therapist finger 3/5 after manual work  Internal Anal Sphincter 2/5 initially with 0/5 anteriorly; after manual work then contract 4/5 holding 10 sec    External Anal Sphincter 2/5 initially with 0/5 anteriorly; after manual work then contract 4/5 holding 10 sec    Puborectalis Initially 2/5  except for the right side 1/5 then after manual work 4/5     (Blank rows = not tested)        TONE: average  PROLAPSE: Posterior wall weakness coming to the introitus  TODAY'S TREATMENT:   03/07/23 Manual: Internal pelvic floor techniques: No emotional/communication barriers or cognitive limitation. Patient is motivated to learn. Patient understands and agrees with treatment goals and plan. PT explains patient will be examined in standing, sitting, and lying down to see how their muscles and joints work. When they are ready, they will be asked to remove their underwear so PT can examine their perineum. The patient is also given the option of providing their own chaperone as one is not provided in our facility. The patient also has the right and is explained the right to defer or refuse  any part of the evaluation or treatment including the internal exam. With the patient's consent, PT will use one gloved finger to gently assess the muscles of the pelvic floor, seeing how well it contracts and relaxes and if there is muscle symmetry. After, the patient will get dressed and PT and patient will discuss exam findings and plan of care. PT and patient discuss plan of care, schedule, attendance policy and HEP activities.  Going through the vaginal canal working on the introitus and levator ani to lengthen the muscles to improve pelvic floor contraction.  Neuromuscular re-education: Pelvic floor contraction training: Tactile cues with therapist finger in the vaginal canal to improve contraction and reduce the posterior wall prolapse Education on different pelvic floor biofeedback units to work on strength of the pelvic floor while reducing the prolapse       02/14/23 Exercises: Stretches/mobility: Hamstring stretch with strap holding for 30 sec bil.  Strengthening: Bridge with feet in different positions to work with leg cramps but she continued to have them Prone with toes on the mat and push the knee upward to contract the gluteals 10 x each side Lay on side hip adduction with breathing out when lift leg 15 x each side Supine hip flexion with resistance 20 x  Standing bird dog 10 x each side Therapeutic activities: Functional strengthening activities: Educated patient on different prolapse supports externally that she wears with different websites to  look at [facility] patient in correct way to have a bowel movement with knees above hips, expanding the lower rib cage, breathing out with tension to relax the pelvic floor and engage the lower abdominals then end with a anal contraction to return to normal tone    01/31/23  Exercises: Stretches/mobility: Educated patient on laying down with feet on bolster, hips elevated to reduce the prolapse 1-2 time per day Strengthening: Supine  with hips on pillow, feet on bolster, with  ball squeeze and contract pelvic floor with abdominals 15 x Supine with hips on pillow and feet on bolster with ball squeeze and lift hips 15 x Supine with hips on pillow and feet on bolster and green band around knees, moving knees outward with pelvic floor contraction 15 x Supine with hips on pillow and feet on bolster and green band around knees, hip flexion isometrics 10 x each leg Standing bilateral shoulder extension with red band 10 x and engaging the core and pelvic floor Therapeutic activities: Functional strengthening activities: Squat with pelvic floor contraction with correct posture and hip flexion and keeping the distance between the rib cage and pubic bone.  Squat with holding 3 pounds with correct technique Standing with distance between the rib cage and pubic bone to not compress the organs into the pelvic area                                                                                                                       PATIENT EDUCATION:  02/14/23 Education details: Access Code: [email or web address], educated correct ways to have a bowel movement Person educated: Patient Education method: Explanation, Demonstration, Tactile cues, Verbal cues, and Handouts Education comprehension: verbalized understanding, returned demonstration, verbal cues required, tactile cues required, and needs further education  HOME EXERCISE PROGRAM: 02/14/23 Access Code: [email or web address] URL: [email or web address] Date: 02/14/2023 Prepared by: Eulis Foster  Exercises - Supine Pelvic Floor Contraction  - 2 x daily - 7 x weekly - 1 sets - 10 reps - 10 sec hold - Quick Flick Pelvic Floor Contractions in Hooklying  - 1 x daily - 7 x weekly - 1 sets - 5 reps - Hooklying Isometric Hip Flexion  - 1 x daily - 7 x weekly - 1 sets - 10 reps - 3 sec hold - Pelvic Floor Muscle Contraction and Adductor Squeeze With Hips Elevated (for Pelvic Organ Prolapse)  - 2 x  daily - 7 x weekly - 1 sets - 10 reps - Pelvic Floor Muscle Contraction and Pelvic Tilt to Bridge With Hips Elevated (for Pelvic Organ Prolapse)  - 1 x daily - 7 x weekly - 1 sets - 10 reps - Supine Hamstring Stretch with Strap  - 1 x daily - 3 x weekly - 1 sets - 2 reps - 30 sec hold - Prone Quadriceps Set  - 1 x daily - 2 x weekly - 2 sets - 10 reps - Sidelying Hip Adduction  - 1 x daily - 2 x weekly -  2 sets - 10 reps - Bird Dog on Counter  - 1 x daily - 2 x weekly - 2 sets - 10 reps   ASSESSMENT:  CLINICAL IMPRESSION: Patient is a 62 y.o. female who was seen today for physical therapy  treatment for vaginal vault prolapse after hysterectomy. She is s/p Posterior repair, enterocele repair, perineorrhaphy, sacrospinous ligament fixation on 08/20/22.   Patient does not feel pressure before and after she has a bowel movement for 15-30 minutes. Right side of pelvic floor is tighter than the left.  Patient pelvic floor strength at first is 2/5 due to the be being in the introitus and the muscles have difficulty with contracting. Once the bulge is  pushed up and manual work is done the strength goes to 3/5. Patient was educated on home unit  for biofeedback to assist with strengthening. Patient would benefit from skilled therapy to improve pelvic floor strength and understand ways to manage prolapse.   OBJECTIVE IMPAIRMENTS: decreased activity tolerance, decreased endurance, decreased strength, and increased fascial restrictions.   ACTIVITY LIMITATIONS: carrying, lifting, and bending  PARTICIPATION LIMITATIONS: shopping and community activity  PERSONAL FACTORS: Age, Fitness, Time since onset of injury/illness/exacerbation, and 1 comorbidity: /p Posterior repair, enterocele repair, perineorrhaphy, sacrospinous ligament fixation on 08/20/22 ; Cholecystectomy 02/10/2016; Rectocele repair  and sacrocolpopexy 06/14/2011;   are also affecting patient's functional outcome.   REHAB POTENTIAL: Good  CLINICAL  DECISION MAKING: Stable/uncomplicated  EVALUATION COMPLEXITY: Low   GOALS: Goals reviewed with patient? Yes  SHORT TERM GOALS: Target date: 02/11/23  Educated on correct toileting to reduce pressure on the pelvic floor.  Baseline: Goal status: Met 02/14/23  2.  Education on improvement of bowel movements with water intake, eating at certain times of the day, and reduce straining.  Baseline:  Goal status: Met 02/14/23  3.  Education on positions to reduce rectocele during the day.  Baseline:  Goal status: Met 01/31/23  4.  Education on posture and alignment to reduce pressure on the pelvic floor.  Baseline:  Goal status: Met 01/31/23   LONG TERM GOALS: Target date: 04/08/23  Patient independent with HEP for core and pelvic floor strength.  Baseline:  Goal status: INITIAL  2.  Pelvic floor strength increased >/= 4/5 so she is able to pull up the pelvic floor with daily activities to reduce further progression of rectocele.  Baseline:  Goal status: INITIAL  3.  Patient is able to contract her abdominals correctly with lifting and home activities to reduce the pressure on the pelvic floor and pelvic organs.  Baseline:  Goal status: INITIAL  4.  Patient is able to demonstrate correct pressure management to reduce the progression of the rectocele.  Baseline:  Goal status: INITIAL   PLAN:  PT FREQUENCY: 1x/week  PT DURATION: 12 weeks  PLANNED INTERVENTIONS: Therapeutic exercises, Therapeutic activity, Neuromuscular re-education, Patient/Family education, Joint mobilization, Dry Needling, Electrical stimulation, Cryotherapy, Moist heat, Taping, Biofeedback, and Manual therapy  PLAN FOR NEXT SESSION:  progress core exercises for core and hips, check pelvic floor strength, go over biofeedback unit if she purchased it, write renewal if continues.   Eulis Foster, PT 03/07/23 9:19 AM

## 2023-03-21 ENCOUNTER — Encounter: Payer: Self-pay | Admitting: Physical Therapy

## 2023-03-21 ENCOUNTER — Encounter: Payer: Managed Care, Other (non HMO) | Admitting: Physical Therapy

## 2023-03-21 DIAGNOSIS — M6281 Muscle weakness (generalized): Secondary | ICD-10-CM

## 2023-03-21 DIAGNOSIS — R279 Unspecified lack of coordination: Secondary | ICD-10-CM

## 2023-03-21 NOTE — Therapy (Signed)
OUTPATIENT PHYSICAL THERAPY FEMALE PELVIC TREATMENT   Patient Name: Makayla [name] MRN: [id number] DOB:[birth year], 62 y.o., female Today's Date: 03/21/2023  END OF SESSION:  PT End of Session - 03/21/23 0836     Visit Number 6    Date for PT Re-Evaluation 06/03/23    Authorization Type [employer]    Authorization - Visit Number 6    Authorization - Number of Visits 30    PT Start Time 0830    PT Stop Time 0915    PT Time Calculation (min) 45 min    Activity Tolerance Patient tolerated treatment well    Behavior During Therapy WFL for tasks assessed/performed             Past Medical History:  Diagnosis Date   Allergy    pollen   Anxiety    Bursitis, trochanteric    bi/lat    Dental crowns present    x 4   Diverticulosis    Fatty liver    GERD (gastroesophageal reflux disease)    Hyperlipidemia    no current med.   Hypertension    white coat syndrome,   Mass of breast, left 01/2012   Obesity    Palpitations    PONV (postoperative nausea and vomiting)    Pre-diabetes    Pre-diabetes    Vitamin D deficiency    Past Surgical History:  Procedure Laterality Date   ABDOMINAL HYSTERECTOMY     partial   ANTERIOR AND POSTERIOR REPAIR WITH SACROSPINOUS FIXATION N/A 08/20/2022   Procedure: POSTERIOR REPAIR WITH PERINEORRHAPHY AND WITH  SACROSPINOUS FIXATION;  Surgeon: Marguerita Beards, MD;  Location: [facility];  Service: Gynecology;  Laterality: N/A;   BREAST BIOPSY Right 08/04/2021   BREAST EXCISIONAL BIOPSY Left 2013   BREAST SURGERY  01/10/2012   lumpectomy - left (excisional biopsy benign)   CHOLECYSTECTOMY N/A 02/10/2016   Procedure: LAPAROSCOPIC CHOLECYSTECTOMY WITH INTRAOPERATIVE CHOLANGIOGRAM;  Surgeon: Chevis Pretty III, MD;  Location: [facility];  Service: General;  Laterality: N/A;   COLONOSCOPY  04/06/2010   D Brodie, normal w/tics   RECTOCELE REPAIR  06/14/2011   cystocele repair, pt states Dr Jacquelyne Balint did not do rectocele and said she had to  come back later.   ROBOTIC ASSISTED LAPAROSCOPIC SACROCOLPOPEXY  06/14/2011   Procedure: ROBOTIC ASSISTED LAPAROSCOPIC SACROCOLPOPEXY;  Surgeon: Crecencio Mc, MD;  Location: [facility];  Service: Urology;  Laterality: N/A;   TUBAL LIGATION     Patient Active Problem List   Diagnosis Date Noted   Right flank pain 03/01/2023   Osteoarthritis, knee 11/28/2022   Bilateral knee pain 11/23/2022   Hepatic steatosis 11/07/2022   Other fatigue 10/24/2022   SOBOE (shortness of breath on exertion) 10/24/2022   GAD (generalized anxiety disorder) 10/24/2022   Pre-diabetes 10/24/2022   Other hyperlipidemia 10/24/2022   Depression screen 10/24/2022   Elevated glucose 07/18/2022   Hemorrhoids 06/07/2022   Abdominal pain, left upper quadrant 02/01/2022   Rib pain on left side 06/13/2020   Colon cancer screening 05/06/2020   Insomnia, psychophysiological 03/14/2020   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   Retro-orbital pain of left eye 03/14/2020   Pain head 02/25/2020   Pulsatile tinnitus of right ear 09/30/2019   Immunization reaction 09/08/2019   Grief reaction 10/03/2018   Elevated glucose level 01/16/2018   Generalized anxiety disorder 01/23/2017   Vitamin D deficiency 05/20/2015   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   Morbid obesity (HCC) with starting  BMI 37 10/25/2011   Routine general medical examination at a health care facility 10/16/2011   Hyperlipidemia LDL goal <130 12/04/2007   GERD 12/04/2007   Essential hypertension 12/04/2007    PCP: Judy Pimple, MD  REFERRING PROVIDER: Marguerita Beards, MD   REFERRING DIAG: N99.3 (ICD-10-CM) - Vaginal vault prolapse after hysterectomy   THERAPY DIAG:  Muscle weakness (generalized) - Plan: PT plan of care cert/re-cert  Unspecified lack of coordination - Plan: PT plan of care cert/re-cert  Rationale for Evaluation and Treatment: Rehabilitation  ONSET DATE: 08/20/22  SUBJECTIVE:                                                                                                                                                                                            SUBJECTIVE STATEMENT: I got the home biofeedback unit. No changes since last visit.      PAIN:  Are you having pain? No  PRECAUTIONS: avoidance of heavy lifting and straining long term to reduce the risk of recurrence.   WEIGHT BEARING RESTRICTIONS: No  FALLS:  Has patient fallen in last 6 months? 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  PLOF: Independent  PATIENT GOALS: learn how to strengthen her pelvic floor to not have prolapse get worse  PERTINENT HISTORY:  s/p Posterior repair, enterocele repair, perineorrhaphy, sacrospinous ligament fixation on 08/20/22 ; Cholecystectomy 02/10/2016; Rectocele repair  and sacrocolpopexy 06/14/2011;   BOWEL MOVEMENT: Pain with bowel movement: No Type of bowel movement:Type (Bristol Stool Scale) Type 5,6, Frequency daily, Strain No, and Splinting no Fully empty rectum: Yes:   Leakage: No Pads: No Fiber supplement: Yes: Miralax  URINATION: Pain with urination: No Fully empty bladder: Yes:   Stream: Strong Urgency: Yes: at times and not able to wait Frequency: every 2 hours Leakage:  none Pads: No  INTERCOURSE: not active  PREGNANCY: Vaginal deliveries 1 Tearing Yes: episiomty  PROLAPSE: Rectocele     OBJECTIVE:   DIAGNOSTIC FINDINGS:  none    COGNITION: Overall cognitive status: Within functional limits for tasks assessed     SENSATION: Light touch: Appears intact Proprioception: Appears intact   POSTURE: rounded shoulders and forward head  PELVIC ALIGNMENT: correct alignment  LUMBARAROM/PROM: lumbar ROM is full    LOWER EXTREMITY ROM:  Passive ROM Right eval Left eval  Hip external rotation 70 50   (Blank rows = not tested)  LOWER EXTREMITY  MMT:  MMT Right eval Left eval Right 03/21/23 Left  03/21/23  Hip flexion 4/5 4/5 4/5 4+/5  Hip extension  3+/5 3+/5 3+/5 4/5  Hip abduction 3+/5 3+/5 3+/5 3+/5   PALPATION:   General  rib angle greater than 90 degrees and decreased movement; hysterectomy scar decreased mobility, not able to contract abdominals instead will lift rib cage                External Perineal Exam red in coloring                             Internal Pelvic Floor posterior wall bulge noted to the introitus, tightness on the puborectalis, vaginally patient is not able to hug the therapist finger and more contraction with the levator muscle than the pubovaginalis  Patient confirms identification and approves PT to assess internal pelvic floor and treatment Yes  PELVIC MMT:   MMT eval 01/24/23 03/07/23  Vaginal 2/5 3/5 with weak lift holding for 5 sec and able to hug the therapist finger 3/5 after manual work  Internal Anal Sphincter 2/5 initially with 0/5 anteriorly; after manual work then contract 4/5 holding 10 sec    External Anal Sphincter 2/5 initially with 0/5 anteriorly; after manual work then contract 4/5 holding 10 sec    Puborectalis Initially 2/5  except for the right side 1/5 then after manual work 4/5     (Blank rows = not tested)        TONE: average  PROLAPSE: Posterior wall weakness coming to the introitus  TODAY'S TREATMENT:   03/21/23 Neuromuscular re-education: Pelvic floor contraction training: Educated patient on using the pelvic floor muscle stimulator to the vaginal muscles to work on strength Patient was educated on how to increase the intensity with therapist at her side so she is able to feel a contraction Patient understands how to place the probe into the vagina to the second metal ring.  Patient used setting P2 in reclined position for 20 minutes    03/07/23 Manual: Internal pelvic floor techniques: No emotional/communication barriers or cognitive limitation. Patient is  motivated to learn. Patient understands and agrees with treatment goals and plan. PT explains patient will be examined in standing, sitting, and lying down to see how their muscles and joints work. When they are ready, they will be asked to remove their underwear so PT can examine their perineum. The patient is also given the option of providing their own chaperone as one is not provided in our facility. The patient also has the right and is explained the right to defer or refuse any part of the evaluation or treatment including the internal exam. With the patient's consent, PT will use one gloved finger to gently assess the muscles of the pelvic floor, seeing how well it contracts and relaxes and if there is muscle symmetry. After, the patient will get dressed and PT and patient will discuss exam findings and plan of care. PT and patient discuss plan of care, schedule, attendance policy and HEP activities.  Going through the vaginal canal working on the introitus and levator ani to lengthen the muscles to improve pelvic floor contraction.  Neuromuscular re-education: Pelvic floor contraction training: Tactile cues with therapist finger in the vaginal canal to improve contraction and reduce the posterior wall prolapse Education on different pelvic floor biofeedback units to work on strength of the pelvic floor while reducing the prolapse       02/14/23 Exercises: Stretches/mobility: Hamstring stretch with strap holding for 30 sec bil.  Strengthening: Bridge with feet in different positions to  work with leg cramps but she continued to have them Prone with toes on the mat and push the knee upward to contract the gluteals 10 x each side Lay on side hip adduction with breathing out when lift leg 15 x each side Supine hip flexion with resistance 20 x  Standing bird dog 10 x each side Therapeutic activities: Functional strengthening activities: Educated patient on different prolapse supports externally  that she wears with different websites to look at [facility] patient in correct way to have a bowel movement with knees above hips, expanding the lower rib cage, breathing out with tension to relax the pelvic floor and engage the lower abdominals then end with a anal contraction to return to normal tone                                                                                   PATIENT EDUCATION:  02/14/23 Education details: Access Code: [email or web address], educated correct ways to have a bowel movement Person educated: Patient Education method: Explanation, Demonstration, Tactile cues, Verbal cues, and Handouts Education comprehension: verbalized understanding, returned demonstration, verbal cues required, tactile cues required, and needs further education  HOME EXERCISE PROGRAM: 02/14/23 Access Code: [email or web address] URL: [email or web address] Date: 02/14/2023 Prepared by: Eulis Foster  Exercises - Supine Pelvic Floor Contraction  - 2 x daily - 7 x weekly - 1 sets - 10 reps - 10 sec hold - Quick Flick Pelvic Floor Contractions in Hooklying  - 1 x daily - 7 x weekly - 1 sets - 5 reps - Hooklying Isometric Hip Flexion  - 1 x daily - 7 x weekly - 1 sets - 10 reps - 3 sec hold - Pelvic Floor Muscle Contraction and Adductor Squeeze With Hips Elevated (for Pelvic Organ Prolapse)  - 2 x daily - 7 x weekly - 1 sets - 10 reps - Pelvic Floor Muscle Contraction and Pelvic Tilt to Bridge With Hips Elevated (for Pelvic Organ Prolapse)  - 1 x daily - 7 x weekly - 1 sets - 10 reps - Supine Hamstring Stretch with Strap  - 1 x daily - 3 x weekly - 1 sets - 2 reps - 30 sec hold - Prone Quadriceps Set  - 1 x daily - 2 x weekly - 2 sets - 10 reps - Sidelying Hip Adduction  - 1 x daily - 2 x weekly - 2 sets - 10 reps - Bird Dog on Counter  - 1 x daily - 2 x weekly - 2 sets - 10 reps   ASSESSMENT:  CLINICAL IMPRESSION: Patient is a 62 y.o. female who was seen today for physical therapy  treatment for  vaginal vault prolapse after hysterectomy. She is s/p Posterior repair, enterocele repair, perineorrhaphy, sacrospinous ligament fixation on 08/20/22.   Patient does not feel pressure before and after she has a bowel movement for 15-30 minutes. Right side of pelvic floor is tighter than the left.  Patient pelvic floor strength at first is 2/5 due to the be being in the introitus and the muscles have difficulty with contracting. Once the bulge is  pushed up and manual work is done the strength goes to 3/5. Patient has been  educated on using a pelvic floro stimulation device to work on strength. Patient was educated on home unit  for biofeedback to assist with strengthening. Patient would benefit from skilled therapy to improve pelvic floor strength and understand ways to manage prolapse.   OBJECTIVE IMPAIRMENTS: decreased activity tolerance, decreased endurance, decreased strength, and increased fascial restrictions.   ACTIVITY LIMITATIONS: carrying, lifting, and bending  PARTICIPATION LIMITATIONS: shopping and community activity  PERSONAL FACTORS: Age, Fitness, Time since onset of injury/illness/exacerbation, and 1 comorbidity: /p Posterior repair, enterocele repair, perineorrhaphy, sacrospinous ligament fixation on 08/20/22 ; Cholecystectomy 02/10/2016; Rectocele repair  and sacrocolpopexy 06/14/2011;   are also affecting patient's functional outcome.   REHAB POTENTIAL: Good  CLINICAL DECISION MAKING: Stable/uncomplicated  EVALUATION COMPLEXITY: Low   GOALS: Goals reviewed with patient? Yes  SHORT TERM GOALS: Target date: 02/11/23  Educated on correct toileting to reduce pressure on the pelvic floor.  Baseline: Goal status: Met 02/14/23  2.  Education on improvement of bowel movements with water intake, eating at certain times of the day, and reduce straining.  Baseline:  Goal status: Met 02/14/23  3.  Education on positions to reduce rectocele during the day.  Baseline:  Goal status: Met  01/31/23  4.  Education on posture and alignment to reduce pressure on the pelvic floor.  Baseline:  Goal status: Met 01/31/23   LONG TERM GOALS: Target date: 05/15/23  Patient independent with HEP for core and pelvic floor strength.  Baseline:  Goal status: ongoing 03/21/23  2.  Pelvic floor strength increased >/= 4/5 so she is able to pull up the pelvic floor with daily activities to reduce further progression of rectocele.  Baseline:  Goal status: ongoing 03/21/23  3.  Patient is able to contract her abdominals correctly with lifting and home activities to reduce the pressure on the pelvic floor and pelvic organs.  Baseline:  Goal status: ongoing 03/21/23  4.  Patient is able to demonstrate correct pressure management to reduce the progression of the rectocele.  Baseline:  Goal status: ongoing 03/21/23   PLAN:  PT FREQUENCY: 2 sessions   PT DURATION: 8 weeks  PLANNED INTERVENTIONS: Therapeutic exercises, Therapeutic activity, Neuromuscular re-education, Patient/Family education, Joint mobilization, Dry Needling, Electrical stimulation, Cryotherapy, Moist heat, Taping, Biofeedback, and Manual therapy  PLAN FOR NEXT SESSION:  progress core exercises for core and hips, see how pelvic floor electrical stimulation is going.   Eulis Foster, PT 03/21/23 9:32 AM

## 2023-03-27 NOTE — Progress Notes (Unsigned)
.smr  Office: [phone]  /  Fax: [phone]  WEIGHT SUMMARY AND BIOMETRICS  Vitals Temp: (!) 97.4 F (36.3 C) BP: 132/79 Pulse Rate: 86 SpO2: 97 %   Anthropometric Measurements Height: 5\' 4"  (1.626 m) Weight: 203 lb (92.1 kg) BMI (Calculated): 34.83 Weight at Last Visit: 202lb Weight Lost Since Last Visit: 0lb Weight Gained Since Last Visit: 1lb Starting Weight: 220lb Total Weight Loss (lbs): 17 lb (7.711 kg) Waist Measurement : 42 inches   Body Composition  Body Fat %: 44.1 % Fat Mass (lbs): 89.6 lbs Muscle Mass (lbs): 108 lbs Total Body Water (lbs): 73.8 lbs Visceral Fat Rating : 13   Other Clinical Data RMR: 1512 Fasting: No Labs: No Today's Visit #: 7 Starting Date: 10/24/22     HPI  Chief Complaint: OBESITY  Makayla [name] is here to discuss her progress with her obesity treatment plan. She is on the the Category 2 Plan and states she is following her eating plan approximately 60 % of the time. She states she is exercising 0 minutes 0 times per week.  Discussed the use of AI scribe software for clinical note transcription with the patient, who gave verbal consent to proceed.  History of Present Illness     Makayla [name], a 62 year old female with a history of prediabetes, hyperlipidemia, metabolically associated fatty liver disease, and hypertension, presents for a follow-up of her obesity treatment. She reports a weight loss of 17 pounds and is currently on a category two meal plan. However, she is experiencing meal fatigue and is struggling with meal variety and planning. She is currently on metformin for her prediabetes and tolerates it well with no gastrointestinal upset. She has a history of abdominal pain, which was diagnosed as diverticulosis.     Interval History:  Since last office visit she up 1 lb.  Bio impedence scale reviewed with the patient:  Down 0.2 lbs muscle mass Up 0.8 lbs adipose mass Hunger/appetite-moderate control but bored on  plan/tired of eating same things.  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 No GI side effects or other side effects.   TREATMENT PLAN FOR OBESITY: Obesity Significant progress with 17 lbs weight loss. Currently experiencing meal fatigue and struggling with meal planning. -Encouraged to continue current diet plan. -Recommended use of MyFitnessPal for meal tracking. -Suggested exploring [email or web address] for recipe ideas. -Consider [employer] prepared meats or [employer] meal plan service for convenience. -Continue regular exercise, with a preference for outdoor activities as weather permits. -Next follow-up appointment on 04/29/2023 at 9:15 AM.  Recommended Dietary Goals  Makayla [name] is currently in the action stage of change. As such, her goal is to continue weight management plan. She has agreed to keeping a food journal and adhering to recommended goals of 1100-1200 calories and 85+ grams of protein.  Behavioral Intervention  We discussed the following Behavioral Modification Strategies today: increasing lean protein intake, decreasing simple carbohydrates , increasing vegetables, increasing lower glycemic fruits, increasing fiber rich foods, increasing water intake, work on tracking and journaling calories using tracking application, decreasing eating out or consumption of processed foods, and making healthy choices when eating convenient foods, emotional eating strategies and understanding the difference between hunger signals and cravings, continue to practice mindfulness when eating, and planning for success.  Additional resources provided today:  NA  Recommended Physical Activity Goals  Makayla [name] has been advised to work up to 150 minutes of  moderate intensity aerobic activity a week and strengthening exercises 2-3 times per week for cardiovascular health, weight loss maintenance and preservation of muscle mass.   She has agreed to Continue current level of physical activity  and Work on scheduling and tracking physical activity.    Pharmacotherapy We discussed various medication options to help Makayla [name] with her weight loss efforts and we both agreed to continue metformin for prediabetes.    Return in about 4 weeks (around 04/25/2023).Marland Kitchen She was informed of the importance of frequent follow up visits to maximize her success with intensive lifestyle modifications for her multiple health conditions.  PHYSICAL EXAM:  Blood pressure 132/79, pulse 86, temperature (!) 97.4 F (36.3 C), height 5\' 4"  (1.626 m), weight 203 lb (92.1 kg), SpO2 97%. Body mass index is 34.84 kg/m.  General: She is overweight, cooperative, alert, well developed, and in no acute distress. PSYCH: Has normal mood, affect and thought process.   Cardiovascular : HR 80's , BP 132/79 Lungs: Normal breathing effort, no conversational dyspnea. Neuro: no focal deficits  DIAGNOSTIC DATA REVIEWED:  BMET    Component Value Date/Time   NA 136 03/01/2023 0919   NA 143 10/24/2022 0926   K 4.0 03/01/2023 0919   CL 100 03/01/2023 0919   CO2 27 03/01/2023 0919   GLUCOSE 97 03/01/2023 0919   BUN 10 03/01/2023 0919   BUN 12 10/24/2022 0926   CREATININE 0.86 03/01/2023 0919   CREATININE 0.89 02/22/2023 0916   CALCIUM 9.6 03/01/2023 0919   GFRNONAA >60 01/30/2022 1630   GFRAA >60 04/03/2020 1742   Lab Results  Component Value Date   HGBA1C 5.9 (H) 02/28/2023   HGBA1C 5.5 01/25/2012   Lab Results  Component Value Date   INSULIN 16.1 10/24/2022   Lab Results  Component Value Date   TSH 3.850 02/28/2023   CBC    Component Value Date/Time   WBC 8.4 03/01/2023 0919   RBC 5.29 (H) 03/01/2023 0919   HGB 13.6 03/01/2023 0919   HGB 14.0 06/14/2017 0807    HGB 13.6 03/04/2012 1604   HCT 42.0 03/01/2023 0919   HCT 41.7 06/14/2017 0807   HCT 42.2 03/04/2012 1604   PLT 181.0 03/01/2023 0919   PLT 134 (L) 06/14/2017 0807   MCV 79.4 03/01/2023 0919   MCV 82 06/14/2017 0807   MCV 84.4 03/04/2012 1604   MCH 25.5 (L) 02/22/2023 0916   MCHC 32.4 03/01/2023 0919   RDW 16.1 (H) 03/01/2023 0919   RDW 14.4 06/14/2017 0807   RDW 14.6 (H) 03/04/2012 1604   Iron Studies    Component Value Date/Time   IRON 58 02/28/2023 0848   TIBC 328 02/28/2023 0848   FERRITIN 36 02/28/2023 0848   IRONPCTSAT 18 02/28/2023 0848   Lipid Panel     Component Value Date/Time   CHOL 217 (H) 02/28/2023 0848   TRIG 129 02/28/2023 0848   HDL 54 02/28/2023 0848   CHOLHDL 4.0 02/28/2023 0848   CHOLHDL 4 06/08/2022 0823   VLDL 31.2 06/08/2022 0823   LDLCALC 140 (H) 02/28/2023 0848   Hepatic Function Panel     Component Value Date/Time   PROT 6.8 03/01/2023 0919   PROT 6.9 10/24/2022 0926   ALBUMIN 4.2 03/01/2023 0919   ALBUMIN 4.2 10/24/2022 0926   AST 11 03/01/2023 0919   ALT 12 03/01/2023 0919   ALKPHOS 96 03/01/2023 0919  BILITOT 0.3 03/01/2023 0919   BILITOT 0.3 10/24/2022 0926   BILIDIR 0.0 03/01/2023 0919   IBILI 0.3 02/22/2023 0916      Component Value Date/Time   TSH 3.850 02/28/2023 0848   TSH 2.22 06/08/2022 0823   Nutritional Lab Results  Component Value Date   VD25OH 56.4 10/24/2022   VD25OH 53.93 06/08/2022   VD25OH 50.41 05/29/2021    ASSOCIATED CONDITIONS ADDRESSED TODAY  ASSESSMENT AND PLAN  Problem List Items Addressed This Visit     Essential hypertension   Pre-diabetes - Primary   Relevant Medications   metFORMIN (GLUCOPHAGE) 500 MG tablet   Other hyperlipidemia   Hepatic steatosis   BMI 34.0-34.9,adult Current BMI 34.9   Morbid obesity (HCC) with starting BMI 37 (Chronic)   Relevant Medications   metFORMIN (GLUCOPHAGE) 500 MG tablet    Prediabetes Labs were reviewed today and discussed with the patient.  Last  A1c was 5.9- not at goal, but improving.   Medication(s): Metformin 500 mg once daily breakfast Polyphagia:No She is working on nutrition plan to decrease simple carbohydrates, increase lean proteins and exercise to promote weight loss, improve glycemic control and prevent progression to Type 2 diabetes.   Lab Results  Component Value Date   HGBA1C 5.9 (H) 02/28/2023   HGBA1C 6.0 (H) 10/24/2022   HGBA1C 6.3 06/08/2022   HGBA1C 5.9 05/02/2020   HGBA1C 5.7 04/16/2019   Lab Results  Component Value Date   INSULIN 16.1 10/24/2022    Plan: Continue and refill Metformin 500 mg once daily breakfast Continue working on nutrition plan to decrease simple carbohydrates, increase lean proteins and exercise to promote weight loss, improve glycemic control and prevent progression to Type 2 diabetes.  Could consider increasing metformin to twice daily next visit.   Hyperlipidemia Labs were reviewed today and discussed with the patient.   LDL is not at goal but slightly improved. HLD 54-stable and at goal. Trig 129 -slightly improved, at goal of < 150.  Medication(s): None Cardiovascular risk factors: dyslipidemia, hypertension, obesity (BMI >= 30 kg/m2), and sedentary lifestyle  Lab Results  Component Value Date   CHOL 217 (H) 02/28/2023   HDL 54 02/28/2023   LDLCALC 140 (H) 02/28/2023   TRIG 129 02/28/2023   CHOLHDL 4.0 02/28/2023   CHOLHDL 4.1 10/24/2022   CHOLHDL 4 06/08/2022   Lab Results  Component Value Date   ALT 12 03/01/2023   AST 11 03/01/2023   ALKPHOS 96 03/01/2023   BILITOT 0.3 03/01/2023   The 10-year ASCVD risk score (Arnett DK, et al., 2019) is: 6.8%   Values used to calculate the score:     Age: 54 years     Sex: Female     Is Non-Hispanic African American: No     Diabetic: No     Tobacco smoker: No     Systolic Blood Pressure: 132 mmHg     Is BP treated: Yes     HDL Cholesterol: 54 mg/dL     Total Cholesterol: 217 mg/dL  Plan: Continue to work on  Engineer, technical sales -decreasing simple carbohydrates, increasing lean proteins, decreasing saturated fats and cholesterol , avoiding trans fats and exercise as able to promote weight loss, improve lipids and decrease cardiovascular risks. Recheck in 3-4 months and if does not continue to improve, consider statin therapy.   MAFLD/NAFLD:  CT ABD/pelvis 02/02/2022: IMPRESSION: 1. Colonic diverticulosis without evidence superimposed acute diverticulitis. 2. Moderate colonic stool burden without evidence of enteric obstruction. 3. Small hiatal hernia.  4. Hepatic steatosis.  Correlation with LFTs is advised.  No current symptoms, liver enzymes normal. Weight loss is the primary treatment and patient has made significant progress. -Continue weight loss efforts. FIB 4 score is 1.11- indicating low risk for fibrosis.   Plan:  Lab results reviewed with patient.  Disease counseling done.  Intensive lifestyle modifications are the first line treatment for this issue.  We discussed several lifestyle modifications today and she will continue to work on diet, exercise and weight loss efforts.   Counseling: NAFLD is an umbrella term that encompasses a disease spectrum that includes steatosis (fat) without inflammation, steatohepatitis (NASH; fat + inflammation in a characteristic pattern), and cirrhosis. Bland steatosis is felt to be a benign condition, with extremely low to no risk of progression to cirrhosis, whereas NASH can progress to cirrhosis. The mainstay of treatment of NAFLD includes lifestyle modification to achieve weight loss, at least 7% of current body weight. Low carbohydrate diets can be beneficial in improving NAFLD liver histology. Additionally, exercise, even the absence of weight loss can have beneficial effects on the patient's metabolic profile and liver health. We recommend that their metabolic comorbidities be aggressively managed, as patients with NAFLD are at increased risk of coronary  artery disease.   Hypertension Hypertension asymptomatic, reasonably well controlled, and no significant medication side effects noted.  Medication(s): amlodipine 2.5 mg daily   Renal function is normal.   BP Readings from Last 3 Encounters:  03/28/23 132/79  03/01/23 122/78  02/28/23 119/81   Lab Results  Component Value Date   CREATININE 0.86 03/01/2023   CREATININE 0.89 02/22/2023   CREATININE 0.83 10/24/2022   Lab Results  Component Value Date   GFR 72.14 03/01/2023   GFR 74.59 06/08/2022   GFR 78.14 01/11/2022    Plan: Continue all antihypertensives at current dosages. Continue to work on nutrition plan to promote weight loss and improve BP control.     ATTESTASTION STATEMENTS:  Reviewed by clinician on day of visit: allergies, medications, problem list, medical history, surgical history, family history, social history, and previous encounter notes.   I have personally spent 46 minutes total time today in preparation, patient care, nutritional counseling and documentation for this visit, including the following: review of clinical lab tests; review of medical tests/procedures/services.      Kyuss Hale, PA-C

## 2023-03-28 ENCOUNTER — Encounter (INDEPENDENT_AMBULATORY_CARE_PROVIDER_SITE_OTHER): Payer: Self-pay | Admitting: Physician Assistant

## 2023-03-28 ENCOUNTER — Ambulatory Visit (INDEPENDENT_AMBULATORY_CARE_PROVIDER_SITE_OTHER): Payer: Managed Care, Other (non HMO) | Admitting: Physician Assistant

## 2023-03-28 VITALS — BP 132/79 | HR 86 | Temp 97.4°F | Ht 64.0 in | Wt 203.0 lb

## 2023-03-28 DIAGNOSIS — Z6834 Body mass index (BMI) 34.0-34.9, adult: Secondary | ICD-10-CM

## 2023-03-28 DIAGNOSIS — E6609 Other obesity due to excess calories: Secondary | ICD-10-CM | POA: Insufficient documentation

## 2023-03-28 DIAGNOSIS — Z6835 Body mass index (BMI) 35.0-35.9, adult: Secondary | ICD-10-CM | POA: Insufficient documentation

## 2023-03-28 DIAGNOSIS — K760 Fatty (change of) liver, not elsewhere classified: Secondary | ICD-10-CM | POA: Diagnosis not present

## 2023-03-28 DIAGNOSIS — R7303 Prediabetes: Secondary | ICD-10-CM

## 2023-03-28 DIAGNOSIS — I10 Essential (primary) hypertension: Secondary | ICD-10-CM

## 2023-03-28 DIAGNOSIS — E7849 Other hyperlipidemia: Secondary | ICD-10-CM | POA: Diagnosis not present

## 2023-03-28 MED ORDER — METFORMIN HCL 500 MG PO TABS
500.0000 mg | ORAL_TABLET | Freq: Every day | ORAL | 0 refills | Status: DC
Start: 2023-03-28 — End: 2023-04-29

## 2023-04-09 ENCOUNTER — Encounter: Payer: Managed Care, Other (non HMO) | Admitting: Dermatology

## 2023-04-29 ENCOUNTER — Ambulatory Visit (INDEPENDENT_AMBULATORY_CARE_PROVIDER_SITE_OTHER): Payer: Managed Care, Other (non HMO) | Admitting: Physician Assistant

## 2023-04-29 ENCOUNTER — Encounter (INDEPENDENT_AMBULATORY_CARE_PROVIDER_SITE_OTHER): Payer: Self-pay | Admitting: Physician Assistant

## 2023-04-29 VITALS — BP 113/71 | HR 83 | Temp 97.5°F | Ht 64.0 in | Wt 203.0 lb

## 2023-04-29 DIAGNOSIS — Z6834 Body mass index (BMI) 34.0-34.9, adult: Secondary | ICD-10-CM

## 2023-04-29 DIAGNOSIS — R7303 Prediabetes: Secondary | ICD-10-CM

## 2023-04-29 DIAGNOSIS — I10 Essential (primary) hypertension: Secondary | ICD-10-CM

## 2023-04-29 MED ORDER — METFORMIN HCL 500 MG PO TABS
500.0000 mg | ORAL_TABLET | Freq: Every day | ORAL | 0 refills | Status: DC
Start: 2023-04-29 — End: 2023-05-27

## 2023-04-29 NOTE — Progress Notes (Signed)
.smr  Office: [phone]  /  Fax: [phone]  WEIGHT SUMMARY AND BIOMETRICS  Vitals Temp: (!) 97.5 F (36.4 C) BP: 113/71 Pulse Rate: 83 SpO2: 97 %   Anthropometric Measurements Height: 5\' 4"  (1.626 m) Weight: 203 lb (92.1 kg) BMI (Calculated): 34.83 Weight at Last Visit: 203 lb Weight Lost Since Last Visit: 0 Weight Gained Since Last Visit: 0 Starting Weight: 220 lb Total Weight Loss (lbs): 17 lb (7.711 kg) Peak Weight: 222 lb Waist Measurement : 42 inches   Body Composition  Body Fat %: 44.3 % Fat Mass (lbs): 90.2 lbs Muscle Mass (lbs): 107.6 lbs Total Body Water (lbs): 75 lbs Visceral Fat Rating : 13   Other Clinical Data RMR: 1512 Fasting: no Labs: no Today's Visit #: 8 Starting Date: 10/24/22     HPI  Chief Complaint: OBESITY  Makayla [name] is here to discuss her progress with her obesity treatment plan. She is on the the Category 2 Plan and keeping a food journal and adhering to recommended goals of 1100-1200 calories and 85 grams of protein and states she is following her eating plan approximately 50 % of the time. She states she is exercising 0 minutes 0 times per week.   Interval History:  Since last office visit she maintained weight.   The patient, with a history of obesity, prediabetes, and hypertension, presents for a follow-up visit. She reports maintaining well on her current treatment plan, which includes metformin for prediabetes and Norvasc for hypertension.  She has been trying to increase her physical activity but struggles with motivation. She has been using [employer] meal delivery and finds this helpful and the meals have generally been very good , which she finds satisfying and helpful in controlling her calorie intake.  She reports occasional cravings, particularly during certain times of the month, but generally avoids snack foods and sweets. She is considering retirement due to stress at work, which she feels may be impacting her overall  health and wellness.   Pharmacotherapy: metformin for primary indication of prediabetes.   TREATMENT PLAN FOR OBESITY:  Recommended Dietary Goals  [name] is currently in the action stage of change. As such, her goal is to continue weight management plan. She has agreed to the Category 2 Plan and keeping a food journal and adhering to recommended goals of 1100-1200 calories and 85 grams of protein.  Behavioral Intervention  We discussed the following Behavioral Modification Strategies today: increasing lean protein intake, decreasing simple carbohydrates , increasing vegetables, increasing lower glycemic fruits, increasing fiber rich foods, increasing water intake, work on tracking and journaling calories using tracking application, decreasing eating out or consumption of processed foods, and making healthy choices when eating convenient foods, emotional eating strategies and understanding the difference between hunger signals and cravings, continue to practice mindfulness when eating, and planning for success.  Additional resources provided today: NA  Recommended Physical Activity Goals  Addelynne has been advised to work up to 150 minutes of moderate intensity aerobic activity a week and strengthening exercises 2-3 times per week for cardiovascular health, weight loss maintenance and preservation of muscle mass.   She has agreed to Think about ways to increase daily physical activity and overcoming barriers to exercise and Look at you tube videos like walking with Verlon Au and think about starting some kettle bell strengthening 3 times weekly.    Pharmacotherapy We discussed various medication options to help [name] with her weight loss efforts and we both agreed to continue metformin for primary indication of  prediabetes.    Return in about 4 weeks (around 05/27/2023).Marland Kitchen She was informed of the importance of frequent follow up visits to maximize her success with intensive lifestyle  modifications for her multiple health conditions.  PHYSICAL EXAM:  Blood pressure 113/71, pulse 83, temperature (!) 97.5 F (36.4 C), height 5\' 4"  (1.626 m), weight 203 lb (92.1 kg), SpO2 97%. Body mass index is 34.84 kg/m.  General: She is overweight, cooperative, alert, well developed, and in no acute distress. PSYCH: Has normal mood, affect and thought process.  Cardiovascular: HR 80's BP 113/71  Lungs: Normal breathing effort, no conversational dyspnea. Neuro: no focal deficits  DIAGNOSTIC DATA REVIEWED:  BMET    Component Value Date/Time   NA 136 03/01/2023 0919   NA 143 10/24/2022 0926   K 4.0 03/01/2023 0919   CL 100 03/01/2023 0919   CO2 27 03/01/2023 0919   GLUCOSE 97 03/01/2023 0919   BUN 10 03/01/2023 0919   BUN 12 10/24/2022 0926   CREATININE 0.86 03/01/2023 0919   CREATININE 0.89 02/22/2023 0916   CALCIUM 9.6 03/01/2023 0919   GFRNONAA >60 01/30/2022 1630   GFRAA >60 04/03/2020 1742   Lab Results  Component Value Date   HGBA1C 5.9 (H) 02/28/2023   HGBA1C 5.5 01/25/2012   Lab Results  Component Value Date   INSULIN 16.1 10/24/2022   Lab Results  Component Value Date   TSH 3.850 02/28/2023   CBC    Component Value Date/Time   WBC 8.4 03/01/2023 0919   RBC 5.29 (H) 03/01/2023 0919   HGB 13.6 03/01/2023 0919   HGB 14.0 06/14/2017 0807   HGB 13.6 03/04/2012 1604   HCT 42.0 03/01/2023 0919   HCT 41.7 06/14/2017 0807   HCT 42.2 03/04/2012 1604   PLT 181.0 03/01/2023 0919   PLT 134 (L) 06/14/2017 0807   MCV 79.4 03/01/2023 0919   MCV 82 06/14/2017 0807   MCV 84.4 03/04/2012 1604   MCH 25.5 (L) 02/22/2023 0916   MCHC 32.4 03/01/2023 0919   RDW 16.1 (H) 03/01/2023 0919   RDW 14.4 06/14/2017 0807   RDW 14.6 (H) 03/04/2012 1604   Iron Studies    Component Value Date/Time   IRON 58 02/28/2023 0848   TIBC 328 02/28/2023 0848   FERRITIN 36 02/28/2023 0848   IRONPCTSAT 18 02/28/2023 0848   Lipid Panel     Component Value Date/Time   CHOL  217 (H) 02/28/2023 0848   TRIG 129 02/28/2023 0848   HDL 54 02/28/2023 0848   CHOLHDL 4.0 02/28/2023 0848   CHOLHDL 4 06/08/2022 0823   VLDL 31.2 06/08/2022 0823   LDLCALC 140 (H) 02/28/2023 0848   Hepatic Function Panel     Component Value Date/Time   PROT 6.8 03/01/2023 0919   PROT 6.9 10/24/2022 0926   ALBUMIN 4.2 03/01/2023 0919   ALBUMIN 4.2 10/24/2022 0926   AST 11 03/01/2023 0919   ALT 12 03/01/2023 0919   ALKPHOS 96 03/01/2023 0919   BILITOT 0.3 03/01/2023 0919   BILITOT 0.3 10/24/2022 0926   BILIDIR 0.0 03/01/2023 0919   IBILI 0.3 02/22/2023 0916      Component Value Date/Time   TSH 3.850 02/28/2023 0848   TSH 2.22 06/08/2022 0823   Nutritional Lab Results  Component Value Date   VD25OH 56.4 10/24/2022   VD25OH 53.93 06/08/2022   VD25OH 50.41 05/29/2021    ASSOCIATED CONDITIONS ADDRESSED TODAY  ASSESSMENT AND PLAN  Problem List Items Addressed This Visit  Essential hypertension   Pre-diabetes - Primary   Relevant Medications   metFORMIN (GLUCOPHAGE) 500 MG tablet   BMI 34.0-34.9,adult Current BMI 34.9   Morbid obesity (HCC) with starting BMI 37 (Chronic)   Relevant Medications   metFORMIN (GLUCOPHAGE) 500 MG tablet   Obesity Maintained weight over the past few weeks. Discussed the importance of consistent exercise and strength training. Patient is currently using Factor meals with good satiety. -Encouraged to increase physical activity, including strength training and walking. -Continue Factor meals.  Prediabetes Last A1c was 5.9/ Insulin 16.1- not at goals.   Medication(s): Metformin 500 mg once daily breakfast  No Gi or other side effects.  Polyphagia:No She is working on nutrition plan to decrease simple carbohydrates, increase lean proteins and exercise to promote weight loss, improve glycemic control and prevent progression to Type 2 diabetes.   Lab Results  Component Value Date   HGBA1C 5.9 (H) 02/28/2023   HGBA1C 6.0 (H) 10/24/2022    HGBA1C 6.3 06/08/2022   HGBA1C 5.9 05/02/2020   HGBA1C 5.7 04/16/2019   Lab Results  Component Value Date   INSULIN 16.1 10/24/2022    Plan: Continue and refill Metformin 500 mg once daily breakfast Continue working on nutrition plan to decrease simple carbohydrates, increase lean proteins and exercise to promote weight loss, improve glycemic control and prevent progression to Type 2 diabetes.  Recheck labs in November.   Hypertension Hypertension improved, asymptomatic, and no significant medication side effects noted.  Medication(s): amlodipine 2.5 mg daily Renal function is normal.   BP Readings from Last 3 Encounters:  04/29/23 113/71  03/28/23 132/79  03/01/23 122/78   Lab Results  Component Value Date   CREATININE 0.86 03/01/2023   CREATININE 0.89 02/22/2023   CREATININE 0.83 10/24/2022   Lab Results  Component Value Date   GFR 72.14 03/01/2023   GFR 74.59 06/08/2022   GFR 78.14 01/11/2022    Plan: Continue all antihypertensives at current dosages. Continue to work on nutrition plan to promote weight loss and improve BP control.     Follow-up in 1 month. ATTESTASTION STATEMENTS:  Reviewed by clinician on day of visit: allergies, medications, problem list, medical history, surgical history, family history, social history, and previous encounter notes.   I have personally spent 30 minutes total time today in preparation, patient care, nutritional counseling and documentation for this visit, including the following: review of clinical lab tests; review of medical tests/procedures/services.      Dayle Mcnerney, PA-C

## 2023-05-07 ENCOUNTER — Ambulatory Visit: Payer: Managed Care, Other (non HMO) | Admitting: Obstetrics and Gynecology

## 2023-05-20 ENCOUNTER — Ambulatory Visit: Payer: Managed Care, Other (non HMO) | Admitting: Obstetrics and Gynecology

## 2023-05-27 ENCOUNTER — Ambulatory Visit (INDEPENDENT_AMBULATORY_CARE_PROVIDER_SITE_OTHER): Payer: Managed Care, Other (non HMO) | Admitting: Physician Assistant

## 2023-05-27 ENCOUNTER — Encounter (INDEPENDENT_AMBULATORY_CARE_PROVIDER_SITE_OTHER): Payer: Self-pay | Admitting: Physician Assistant

## 2023-05-27 VITALS — BP 107/71 | HR 86 | Temp 97.8°F | Ht 64.0 in | Wt 202.0 lb

## 2023-05-27 DIAGNOSIS — R7303 Prediabetes: Secondary | ICD-10-CM | POA: Diagnosis not present

## 2023-05-27 DIAGNOSIS — I10 Essential (primary) hypertension: Secondary | ICD-10-CM | POA: Diagnosis not present

## 2023-05-27 DIAGNOSIS — Z6834 Body mass index (BMI) 34.0-34.9, adult: Secondary | ICD-10-CM | POA: Diagnosis not present

## 2023-05-27 MED ORDER — METFORMIN HCL 500 MG PO TABS
500.0000 mg | ORAL_TABLET | Freq: Every day | ORAL | 0 refills | Status: DC
Start: 2023-05-27 — End: 2023-06-24

## 2023-05-27 NOTE — Progress Notes (Signed)
.smr  Office: [phone]  /  Fax: [phone]  WEIGHT SUMMARY AND BIOMETRICS  Vitals Temp: 97.8 F (36.6 C) BP: 107/71 Pulse Rate: 86 SpO2: 97 %   Anthropometric Measurements Height: 5\' 4"  (1.626 m) Weight: 202 lb (91.6 kg) BMI (Calculated): 34.66 Weight at Last Visit: 203 lb Weight Lost Since Last Visit: 1 lb Weight Gained Since Last Visit: 0 Starting Weight: 220 lb Total Weight Loss (lbs): 18 lb (8.165 kg) Peak Weight: 222 lb Waist Measurement : 42 inches   Body Composition  Body Fat %: 44 % Fat Mass (lbs): 89 lbs Muscle Mass (lbs): 107.4 lbs Total Body Water (lbs): 73.8 lbs Visceral Fat Rating : 13   Other Clinical Data RMR: 1512 Fasting: no Labs: no Today's Visit #: 9 Starting Date: 10/24/22     HPI  Chief Complaint: OBESITY  Suki is here to discuss her progress with her obesity treatment plan. She is on the the Category 2 Plan and keeping a food journal and adhering to recommended goals of 1100-1200 calories and 85+ grams of protein and states she is following her eating plan approximately 75 % of the time. She states she is exercising Yes to next video 10 minutes 3 times per week.   Interval History:  Since last office visit she down 1 lb.   The patient, with a history of obesity, prediabetes, and hypertension, presents for a follow-up visit. She has been actively working on her weight loss plan and has lost 18 pounds overall. She has been following a home exercise routine, primarily using YouTube videos for guidance. She reports no issues with her current medication, metformin. She inquires about a weight loss injection she heard about from a friend, but understands that it may not be suitable for her. She also mentions that she does not eat enough, sometimes skipping meals due to lack of hunger. We discussed the importance of regular meals and adequate protein intake for weight loss and overall health.  Pharmacotherapy: metformin 500 mg once  daily. No GI side effects with metformin.   TREATMENT PLAN FOR OBESITY:  Recommended Dietary Goals  Nassim is currently in the action stage of change. As such, her goal is to continue weight management plan. She has agreed to the Category 2 Plan and keeping a food journal and adhering to recommended goals of 1100-1200 calories and 85+ grams of protein.  Behavioral Intervention  We discussed the following Behavioral Modification Strategies today: continue to work on maintaining a reduced calorie state, getting the recommended amount of protein, incorporating whole foods, making healthy choices, staying well hydrated and practicing mindfulness when eating..  Additional resources provided today: NA  Recommended Physical Activity Goals  Mykyla has been advised to work up to 150 minutes of moderate intensity aerobic activity a week and strengthening exercises 2-3 times per week for cardiovascular health, weight loss maintenance and preservation of muscle mass.   She has agreed to Increase physical activity in their day and reduce sedentary time (increase NEAT). and Start strengthening exercises with a goal of 2-3 sessions a week    Pharmacotherapy We discussed various medication options to help Vola with her weight loss efforts and we both agreed to continue metformin for prediabetes.    Return in about 4 weeks (around 06/24/2023).Marland Kitchen She was informed of the importance of frequent follow up visits to maximize her success with intensive lifestyle modifications for her multiple health conditions.  PHYSICAL EXAM:  Blood pressure 107/71, pulse 86, temperature 97.8 F (36.6 C),  height 5\' 4"  (1.626 m), weight 202 lb (91.6 kg), SpO2 97%. Body mass index is 34.67 kg/m.  General: She is overweight, cooperative, alert, well developed, and in no acute distress. PSYCH: Has normal mood, affect and thought process.   Cardiovascular: HR 80's BP 107/71 Lungs: Normal breathing effort, no conversational  dyspnea. Neuro: no focal deficits  DIAGNOSTIC DATA REVIEWED:  BMET    Component Value Date/Time   NA 136 03/01/2023 0919   NA 143 10/24/2022 0926   K 4.0 03/01/2023 0919   CL 100 03/01/2023 0919   CO2 27 03/01/2023 0919   GLUCOSE 97 03/01/2023 0919   BUN 10 03/01/2023 0919   BUN 12 10/24/2022 0926   CREATININE 0.86 03/01/2023 0919   CREATININE 0.89 02/22/2023 0916   CALCIUM 9.6 03/01/2023 0919   GFRNONAA >60 01/30/2022 1630   GFRAA >60 04/03/2020 1742   Lab Results  Component Value Date   HGBA1C 5.9 (H) 02/28/2023   HGBA1C 5.5 01/25/2012   Lab Results  Component Value Date   INSULIN 16.1 10/24/2022   Lab Results  Component Value Date   TSH 3.850 02/28/2023   CBC    Component Value Date/Time   WBC 8.4 03/01/2023 0919   RBC 5.29 (H) 03/01/2023 0919   HGB 13.6 03/01/2023 0919   HGB 14.0 06/14/2017 0807   HGB 13.6 03/04/2012 1604   HCT 42.0 03/01/2023 0919   HCT 41.7 06/14/2017 0807   HCT 42.2 03/04/2012 1604   PLT 181.0 03/01/2023 0919   PLT 134 (L) 06/14/2017 0807   MCV 79.4 03/01/2023 0919   MCV 82 06/14/2017 0807   MCV 84.4 03/04/2012 1604   MCH 25.5 (L) 02/22/2023 0916   MCHC 32.4 03/01/2023 0919   RDW 16.1 (H) 03/01/2023 0919   RDW 14.4 06/14/2017 0807   RDW 14.6 (H) 03/04/2012 1604   Iron Studies    Component Value Date/Time   IRON 58 02/28/2023 0848   TIBC 328 02/28/2023 0848   FERRITIN 36 02/28/2023 0848   IRONPCTSAT 18 02/28/2023 0848   Lipid Panel     Component Value Date/Time   CHOL 217 (H) 02/28/2023 0848   TRIG 129 02/28/2023 0848   HDL 54 02/28/2023 0848   CHOLHDL 4.0 02/28/2023 0848   CHOLHDL 4 06/08/2022 0823   VLDL 31.2 06/08/2022 0823   LDLCALC 140 (H) 02/28/2023 0848   Hepatic Function Panel     Component Value Date/Time   PROT 6.8 03/01/2023 0919   PROT 6.9 10/24/2022 0926   ALBUMIN 4.2 03/01/2023 0919   ALBUMIN 4.2 10/24/2022 0926   AST 11 03/01/2023 0919   ALT 12 03/01/2023 0919   ALKPHOS 96 03/01/2023 0919    BILITOT 0.3 03/01/2023 0919   BILITOT 0.3 10/24/2022 0926   BILIDIR 0.0 03/01/2023 0919   IBILI 0.3 02/22/2023 0916      Component Value Date/Time   TSH 3.850 02/28/2023 0848   TSH 2.22 06/08/2022 0823   Nutritional Lab Results  Component Value Date   VD25OH 56.4 10/24/2022   VD25OH 53.93 06/08/2022   VD25OH 50.41 05/29/2021    ASSOCIATED CONDITIONS ADDRESSED TODAY  ASSESSMENT AND PLAN  Problem List Items Addressed This Visit     Essential hypertension   Pre-diabetes - Primary   Relevant Medications   metFORMIN (GLUCOPHAGE) 500 MG tablet   Morbid obesity (HCC) with starting BMI 37 (Chronic)   Relevant Medications   metFORMIN (GLUCOPHAGE) 500 MG tablet  Obesity 18-pound weight loss achieved through diet and exercise. Discussed  the importance of strength training and maintaining a balanced diet for further weight loss and overall health. -Continue current diet and exercise regimen. -Add strength training exercises 2-3 times a week for 20 minutes each session. -Continue Metformin as prescribed. -Return for follow-up in 4 weeks on 06/24/2023.  Prediabetes Stable, no new symptoms reported. Discussed the importance of maintaining a balanced diet and regular exercise. A1c improved but not yet at goal of < 5.6 Lab Results  Component Value Date   HGBA1C 5.9 (H) 02/28/2023    -Continue Joana Reamer Metformin 500 mg daily as prescribed. -Continue current nutrition plan and exercise regimen. Continue working on nutrition plan to decrease simple carbohydrates, increase lean proteins and exercise to promote weight loss, improve glycemic control and prevent progression to Type 2 diabetes.    Hypertension Hypertension asymptomatic, reasonably well controlled, and no significant medication side effects noted.  Medication(s): amlodipine 2.5 mg daily Renal function is normal.   BP Readings from Last 3 Encounters:  05/27/23 107/71  04/29/23 113/71  03/28/23 132/79   Lab Results   Component Value Date   CREATININE 0.86 03/01/2023   CREATININE 0.89 02/22/2023   CREATININE 0.83 10/24/2022   Lab Results  Component Value Date   GFR 72.14 03/01/2023   GFR 74.59 06/08/2022   GFR 78.14 01/11/2022    Plan: Continue amlodipine 2.5 mg daily current dosages. Continue to work on nutrition plan to promote weight loss and improve BP control.    General Health Maintenance -Continue daily probiotic and psyllium husk capsules for gut health. -Consider adding more fiber to diet through food or supplements.  ATTESTASTION STATEMENTS:  Reviewed by clinician on day of visit: allergies, medications, problem list, medical history, surgical history, family history, social history, and previous encounter notes.   I have personally spent 38 minutes total time today in preparation, patient care, nutritional counseling and documentation for this visit, including the following: review of clinical lab tests; review of medical tests/procedures/services.      Sandy Blouch, PA-C

## 2023-06-14 ENCOUNTER — Encounter: Payer: Self-pay | Admitting: Obstetrics and Gynecology

## 2023-06-14 ENCOUNTER — Ambulatory Visit (INDEPENDENT_AMBULATORY_CARE_PROVIDER_SITE_OTHER): Payer: Managed Care, Other (non HMO) | Admitting: Obstetrics and Gynecology

## 2023-06-14 VITALS — BP 119/77 | HR 88

## 2023-06-14 DIAGNOSIS — N816 Rectocele: Secondary | ICD-10-CM | POA: Diagnosis not present

## 2023-06-14 NOTE — Progress Notes (Signed)
[facility] Return Visit  SUBJECTIVE  History of Present Illness: Makayla [name] is a 62 y.o. female seen in follow-up for prolapse. She is s/p Posterior repair, enterocele repair, perineorrhaphy, sacrospinous ligament fixation on 08/20/22  Also  s/p robotic sacrocolpopexy in 2012 with Alyte Y-shaped mesh graft.   A few months ago noted a recurrence and has been attending pelvic PT. Has not noticed much improvement. Prolapse as not as bas as it was initially before the surgery.   Past Medical History: Patient  has a past medical history of Allergy, Anxiety, Bursitis, trochanteric, Dental crowns present, Diverticulosis, Fatty liver, GERD (gastroesophageal reflux disease), Hyperlipidemia, Hypertension, Mass of breast, left (01/2012), Obesity, Palpitations, PONV (postoperative nausea and vomiting), Pre-diabetes, Pre-diabetes, and Vitamin D deficiency.   Past Surgical History: She  has a past surgical history that includes Tubal ligation; Robotic assisted laparoscopic sacrocolpopexy (06/14/2011); Rectocele repair (06/14/2011); Abdominal hysterectomy; Breast surgery (01/10/2012); Cholecystectomy (N/A, 02/10/2016); Breast excisional biopsy (Left, 2013); Colonoscopy (04/06/2010); Breast biopsy (Right, 08/04/2021); and Anterior and posterior repair with sacrospinous fixation (N/A, 08/20/2022).   Medications: She has a current medication list which includes the following prescription(s): acetaminophen, amlodipine, b complex vitamins, esomeprazole, estradiol, fluticasone, ibuprofen, loratadine, metformin, sertraline, and vitamin d (cholecalciferol).   Allergies: Patient is allergic to buspar [buspirone].   Social History: Patient  reports that she has never smoked. She has been exposed to tobacco smoke. She has never used smokeless tobacco. She reports that she does not drink alcohol and does not use drugs.      OBJECTIVE     Physical Exam: Vitals:   06/14/23 0803  BP: 119/77  Pulse:  88   Gen: No apparent distress, A&O x 3.  Detailed Urogynecologic Evaluation:  Deferred. Prior exam showed:  POP-Q  -3                                            Aa   -3                                           Ba  -9                                              C   4                                            Gh  6                                            Pb  10                                            tvl   0  Ap  0                                            Bp                                                 D   Pessary fitting: Placed a #5 and #4 ring but these were too large and did not sit well in the vagina. Tried a #2 cube which was also too large. Placed  #1 cube. It was comfortable, fit well, and stayed in placed with strong cough, valsalva and bending.     ASSESSMENT AND PLAN    Ms. Wilhelm is a 62 y.o. with:  1. Prolapse of posterior vaginal wall     - Discussed the option of surgery (repeat posterior repair), but she is unsure she wants another surgery at this time. Has already had a sacrocolpopexy and has very good apical support.  - She was interested in a pessary and was fit with a #1 cube today. Will leave in place for a month and return to check on it.   Marguerita Beards, MD  Time spent: I spent 20 minutes dedicated to the care of this patient on the date of this encounter to include pre-visit review of records, face-to-face time with the patient and post visit documentation. Additional time was spent on pessary fitting.

## 2023-06-17 ENCOUNTER — Other Ambulatory Visit: Payer: Self-pay | Admitting: Family Medicine

## 2023-06-17 DIAGNOSIS — Z1231 Encounter for screening mammogram for malignant neoplasm of breast: Secondary | ICD-10-CM

## 2023-06-21 ENCOUNTER — Encounter: Payer: Self-pay | Admitting: Family Medicine

## 2023-06-21 ENCOUNTER — Ambulatory Visit: Payer: Managed Care, Other (non HMO) | Admitting: Family Medicine

## 2023-06-21 VITALS — BP 128/82 | HR 92 | Temp 98.3°F | Ht 63.75 in | Wt 207.4 lb

## 2023-06-21 DIAGNOSIS — R1012 Left upper quadrant pain: Secondary | ICD-10-CM | POA: Diagnosis not present

## 2023-06-21 DIAGNOSIS — F411 Generalized anxiety disorder: Secondary | ICD-10-CM

## 2023-06-21 DIAGNOSIS — R0781 Pleurodynia: Secondary | ICD-10-CM

## 2023-06-21 DIAGNOSIS — I10 Essential (primary) hypertension: Secondary | ICD-10-CM | POA: Diagnosis not present

## 2023-06-21 DIAGNOSIS — E7849 Other hyperlipidemia: Secondary | ICD-10-CM

## 2023-06-21 DIAGNOSIS — M25562 Pain in left knee: Secondary | ICD-10-CM

## 2023-06-21 DIAGNOSIS — E785 Hyperlipidemia, unspecified: Secondary | ICD-10-CM

## 2023-06-21 DIAGNOSIS — Z23 Encounter for immunization: Secondary | ICD-10-CM

## 2023-06-21 DIAGNOSIS — Z0001 Encounter for general adult medical examination with abnormal findings: Secondary | ICD-10-CM | POA: Diagnosis not present

## 2023-06-21 DIAGNOSIS — Z0000 Encounter for general adult medical examination without abnormal findings: Secondary | ICD-10-CM

## 2023-06-21 DIAGNOSIS — G8929 Other chronic pain: Secondary | ICD-10-CM

## 2023-06-21 DIAGNOSIS — K219 Gastro-esophageal reflux disease without esophagitis: Secondary | ICD-10-CM

## 2023-06-21 DIAGNOSIS — M25561 Pain in right knee: Secondary | ICD-10-CM

## 2023-06-21 DIAGNOSIS — K760 Fatty (change of) liver, not elsewhere classified: Secondary | ICD-10-CM

## 2023-06-21 DIAGNOSIS — Z1211 Encounter for screening for malignant neoplasm of colon: Secondary | ICD-10-CM

## 2023-06-21 DIAGNOSIS — R7303 Prediabetes: Secondary | ICD-10-CM

## 2023-06-21 DIAGNOSIS — E559 Vitamin D deficiency, unspecified: Secondary | ICD-10-CM

## 2023-06-21 LAB — COMPREHENSIVE METABOLIC PANEL
ALT: 11 U/L (ref 0–35)
AST: 11 U/L (ref 0–37)
Albumin: 4.3 g/dL (ref 3.5–5.2)
Alkaline Phosphatase: 103 U/L (ref 39–117)
BUN: 11 mg/dL (ref 6–23)
CO2: 30 meq/L (ref 19–32)
Calcium: 9.7 mg/dL (ref 8.4–10.5)
Chloride: 102 meq/L (ref 96–112)
Creatinine, Ser: 0.94 mg/dL (ref 0.40–1.20)
GFR: 64.7 mL/min (ref >60.00)
Glucose, Bld: 110 mg/dL — ABNORMAL HIGH (ref 70–99)
Potassium: 4.2 meq/L (ref 3.5–5.1)
Sodium: 140 meq/L (ref 135–145)
Total Bilirubin: 0.3 mg/dL (ref 0.2–1.2)
Total Protein: 6.7 g/dL (ref 6.0–8.3)

## 2023-06-21 LAB — CBC WITH DIFFERENTIAL/PLATELET
Basophils Absolute: 0.1 10*3/uL (ref 0.0–0.1)
Basophils Relative: 0.9 % (ref 0.0–3.0)
Eosinophils Absolute: 0.1 10*3/uL (ref 0.0–0.7)
Eosinophils Relative: 1.5 % (ref 0.0–5.0)
HCT: 43.1 % (ref 36.0–46.0)
Hemoglobin: 13.9 g/dL (ref 12.0–15.0)
Lymphocytes Relative: 23 % (ref 12.0–46.0)
Lymphs Abs: 2.1 10*3/uL (ref 0.7–4.0)
MCHC: 32.2 g/dL (ref 30.0–36.0)
MCV: 80.8 fL (ref 78.0–100.0)
Monocytes Absolute: 0.6 10*3/uL (ref 0.1–1.0)
Monocytes Relative: 6.8 % (ref 3.0–12.0)
Neutro Abs: 6.1 10*3/uL (ref 1.4–7.7)
Neutrophils Relative %: 67.8 % (ref 43.0–77.0)
Platelets: 176 10*3/uL (ref 150.0–400.0)
RBC: 5.33 Mil/uL — ABNORMAL HIGH (ref 3.87–5.11)
RDW: 15.5 % (ref 11.5–15.5)
WBC: 9 10*3/uL (ref 4.0–10.5)

## 2023-06-21 LAB — LIPID PANEL
Cholesterol: 231 mg/dL — ABNORMAL HIGH (ref 0–200)
HDL: 55.1 mg/dL (ref >39.00)
LDL Cholesterol: 147 mg/dL — ABNORMAL HIGH (ref 0–99)
NonHDL: 175.92
Total CHOL/HDL Ratio: 4
Triglycerides: 144 mg/dL (ref 0.0–149.0)
VLDL: 28.8 mg/dL (ref 0.0–40.0)

## 2023-06-21 LAB — TSH: TSH: 3.16 u[IU]/mL (ref 0.35–5.50)

## 2023-06-21 LAB — HEMOGLOBIN A1C: Hgb A1c MFr Bld: 6.1 % (ref 4.6–6.5)

## 2023-06-21 MED ORDER — FAMOTIDINE 20 MG PO TABS: 20.0000 mg | ORAL_TABLET | Freq: Two times a day (BID) | ORAL | 1 refills | Status: DC

## 2023-06-21 MED ORDER — SERTRALINE HCL 25 MG PO TABS: 25.0000 mg | ORAL_TABLET | Freq: Every day | ORAL | 3 refills | Status: DC

## 2023-06-21 MED ORDER — FLUTICASONE PROPIONATE 50 MCG/ACT NA SUSP: 2.0000 | Freq: Every day | NASAL | 3 refills | Status: AC

## 2023-06-21 MED ORDER — AMLODIPINE BESYLATE 2.5 MG PO TABS: 2.5000 mg | ORAL_TABLET | Freq: Every day | ORAL | 3 refills | Status: DC

## 2023-06-21 NOTE — Progress Notes (Unsigned)
Subjective:    Patient ID: Makayla [name], female    DOB: 06/03/1961, 62 y.o.   MRN: [id number]  HPI  Here for health maintenance exam and to review chronic medical problems   Wt Readings from Last 3 Encounters:  06/21/23 207 lb 6 oz (94.1 kg)  05/27/23 202 lb (91.6 kg)  04/29/23 203 lb (92.1 kg)   35.88 kg/m  Vitals:   06/21/23 0801  BP: 128/82  Pulse: 92  Temp: 98.3 F (36.8 C)  SpO2: 96%    Going to the healthy weight clinic  Going well  Considering zepbound    Immunization History  Administered Date(s) Administered   Influenza,inj,Quad PF,6+ Mos 05/18/2014, 05/29/2021, 06/18/2022   Influenza-Unspecified 05/19/2015, 05/05/2018, 05/25/2020   Moderna Sars-Covid-2 Vaccination 07/29/2019, 09/04/2019   Td 04/07/2007, 04/06/2017    Health Maintenance Due  Topic Date Due   INFLUENZA VACCINE  03/07/2023   Flu vaccine - today   Shingrix- declines   Mammogram 12/23, has next one scheduled Self breast exam-no lumps  Her left breast is chronically tender - for years   Gyn health- had appointment with uro/gyn and her prolapse is back  Tried pessary Estrace cream twice weekly   Colon cancer screening  Colonoscopy due 06/2032  Bone health   Falls- none Fractures-none  Supplements  Last vitamin D Lab Results  Component Value Date   VD25OH 56.4 10/24/2022    Exercise  Just started some indoor walking videos  And 2 lb weights   Knee problems / arthritis  Left knee Has ortho coming up    Mood    06/21/2023    8:05 AM 03/01/2023    8:37 AM 02/22/2023    8:38 AM 11/23/2022    8:17 AM 06/18/2022    9:21 AM  Depression screen PHQ 2/9  Decreased Interest 0 0 0 0 0  Down, Depressed, Hopeless 0 0 0 0 0  PHQ - 2 Score 0 0 0 0 0  Altered sleeping 0 0 0 0   Tired, decreased energy 0 1 1 0   Change in appetite 0 0 0 0   Feeling bad or failure about yourself  0 0 0 0   Trouble concentrating 0 0 0 0   Moving slowly or fidgety/restless 0 0 0 0   Suicidal  thoughts 0 0 0 0   PHQ-9 Score 0 1 1 0   Difficult doing work/chores Not difficult at all Not difficult at all Not difficult at all Not difficult at all    Sertaline 25 mg    HTN bp is stable today  No cp or palpitations or headaches or edema  No side effects to medicines  BP Readings from Last 3 Encounters:  06/21/23 128/82  06/14/23 119/77  05/27/23 107/71    Amlodipine 2.5 mg daily   Lab Results  Component Value Date   NA 136 03/01/2023   K 4.0 03/01/2023   CO2 27 03/01/2023   GLUCOSE 97 03/01/2023   BUN 10 03/01/2023   CREATININE 0.86 03/01/2023   CALCIUM 9.6 03/01/2023   GFR 72.14 03/01/2023   EGFR 80 10/24/2022   GFRNONAA >60 01/30/2022   History of hepatic steatosis  Lab Results  Component Value Date   ALT 12 03/01/2023   AST 11 03/01/2023   ALKPHOS 96 03/01/2023   BILITOT 0.3 03/01/2023    GERD Nexium bid  ? If some gastritis after eating tomato soup   Prediabetes Lab Results  Component Value Date  HGBA1C 5.9 (H) 02/28/2023   Metformin 500 mg daily    Hyperlipidemia Lab Results  Component Value Date   CHOL 217 (H) 02/28/2023   HDL 54 02/28/2023   LDLCALC 140 (H) 02/28/2023   TRIG 129 02/28/2023   CHOLHDL 4.0 02/28/2023    Worse left abd pain  Mid  Has asked GI and derm  Some burning    Patient Active Problem List   Diagnosis Date Noted   BMI 34.0-34.9,adult Current BMI 34.9 03/28/2023   Right flank pain 03/01/2023   Osteoarthritis, knee 11/28/2022   Bilateral knee pain 11/23/2022   Hepatic steatosis 11/07/2022   Other fatigue 10/24/2022   SOBOE (shortness of breath on exertion) 10/24/2022   GAD (generalized anxiety disorder) 10/24/2022   Pre-diabetes 10/24/2022   Other hyperlipidemia 10/24/2022   Depression screen 10/24/2022   Hemorrhoids 06/07/2022   Abdominal pain, left upper quadrant 02/01/2022   Rib pain on left side 06/13/2020   Colon cancer screening 05/06/2020   Insomnia, psychophysiological 03/14/2020   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   Retro-orbital pain of left eye 03/14/2020   Pain head 02/25/2020   Immunization reaction 09/08/2019   Grief reaction 10/03/2018   Generalized anxiety disorder 01/23/2017   Vitamin D deficiency 05/20/2015   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   Morbid obesity (HCC) with starting BMI 37 10/25/2011   Routine general medical examination at a health care facility 10/16/2011   Hyperlipidemia LDL goal <130 12/04/2007   GERD 12/04/2007   Essential hypertension 12/04/2007   Past Medical History:  Diagnosis Date   Allergy    pollen   Anxiety    Bursitis, trochanteric    bi/lat    Dental crowns present    x 4   Diverticulosis    Fatty liver    GERD (gastroesophageal reflux disease)    Hyperlipidemia    no current med.   Hypertension    white coat syndrome,   Mass of breast, left 01/2012   Obesity    Palpitations    PONV (postoperative nausea and vomiting)    Pre-diabetes    Pre-diabetes    Vitamin D deficiency    Past Surgical History:  Procedure Laterality Date   ABDOMINAL HYSTERECTOMY     partial   ANTERIOR AND POSTERIOR REPAIR WITH SACROSPINOUS FIXATION N/A 08/20/2022   Procedure: POSTERIOR REPAIR WITH PERINEORRHAPHY AND WITH  SACROSPINOUS FIXATION;  Surgeon: Marguerita Beards, MD;  Location[facility];  Service: Gynecology;  Laterality: N/A;   BREAST BIOPSY Right 08/04/2021   BREAST EXCISIONAL BIOPSY Left 2013   BREAST SURGERY  01/10/2012   lumpectomy - left (excisional biopsy benign)   CHOLECYSTECTOMY N/A 02/10/2016   Procedure: LAPAROSCOPIC CHOLECYSTECTOMY WITH INTRAOPERATIVE CHOLANGIOGRAM;  Surgeon: Chevis Pretty III, MD;  Location: [facility];  Service: General;  Laterality: N/A;   COLONOSCOPY  04/06/2010   D Brodie, normal w/tics   RECTOCELE REPAIR  06/14/2011   cystocele repair, pt states Dr Jacquelyne Balint did not do rectocele and said she had to come back later.   ROBOTIC ASSISTED  LAPAROSCOPIC SACROCOLPOPEXY  06/14/2011   Procedure: ROBOTIC ASSISTED LAPAROSCOPIC SACROCOLPOPEXY;  Surgeon: Crecencio Mc, MD;  Location: [facility];  Service: Urology;  Laterality: N/A;   TUBAL LIGATION     Social History   Tobacco Use   Smoking status: Never    Passive exposure: Past   Smokeless tobacco: Never  Vaping Use   Vaping status: Never Used  Substance Use Topics  Alcohol use: No    Alcohol/week: 0.0 standard drinks of alcohol   Drug use: No   Family History  Problem Relation Age of Onset   Hypertension Mother    Hyperlipidemia Mother    Diabetes Mother    Breast cancer Mother 57   Uterine cancer Mother    GER disease Father    Depression Sister    Heart disease Brother    Heart attack Maternal Grandmother    Hypertension Maternal Grandmother    Colon cancer Neg Hx    Colon polyps Neg Hx    Esophageal cancer Neg Hx    Rectal cancer Neg Hx    Stomach cancer Neg Hx    Allergies  Allergen Reactions   Buspar [Buspirone]     Made her more anxious   Current Outpatient Medications on File Prior to Visit  Medication Sig Dispense Refill   acetaminophen (TYLENOL) 500 MG tablet Take 1 tablet (500 mg total) by mouth every 6 (six) hours as needed (pain). 30 tablet 0   amLODipine (NORVASC) 2.5 MG tablet Take 1 tablet (2.5 mg total) by mouth daily. 90 tablet 3   b complex vitamins capsule Take 1 capsule by mouth daily.     esomeprazole (NEXIUM) 20 MG capsule Take 20 mg by mouth 2 (two) times daily before a meal.     estradiol (ESTRACE) 0.1 MG/GM vaginal cream Place 0.5 g vaginally 2 (two) times a week. Place 0.5g nightly for two weeks then twice a week after 30 g 11   fluticasone (FLONASE) 50 MCG/ACT nasal spray Place 2 sprays into both nostrils daily. 16 g 6   ibuprofen (ADVIL) 800 MG tablet TAKE 1 TABLET BY MOUTH EVERY 8 HOURS AS NEEDED FOR PAIN 90 tablet 1   loratadine (CLARITIN) 10 MG tablet Take 10 mg by mouth daily as needed for allergies.     metFORMIN (GLUCOPHAGE) 500  MG tablet Take 1 tablet (500 mg total) by mouth daily with breakfast. 90 tablet 0   sertraline (ZOLOFT) 25 MG tablet Take 1 tablet (25 mg total) by mouth daily. 90 tablet 3   Vitamin D, Cholecalciferol, 25 MCG (1000 UT) TABS Take 5,000 Units by mouth daily.     No current facility-administered medications on file prior to visit.    Review of Systems     Objective:   Physical Exam        Assessment & Plan:   Problem List Items Addressed This Visit       Cardiovascular and Mediastinum   Essential hypertension     Digestive   Hepatic steatosis   GERD     Other   Vitamin D deficiency   Routine general medical examination at a health care facility   Pre-diabetes - Primary   Other hyperlipidemia   Generalized anxiety disorder   Colon cancer screening

## 2023-06-21 NOTE — Patient Instructions (Addendum)
If you are interested in the shingles vaccine series (Shingrix), call your insurance or pharmacy to check on coverage and location it must be given.  If affordable - you can schedule it here or at your pharmacy depending on coverage   Flu shot today   Follow up with your knee specialist   Stop all nsaids   Try pepcid 20 mg twice daily  Continue nexium  If this does now help the left sided abdominal pain follow up with [place] or GI

## 2023-06-23 NOTE — Assessment & Plan Note (Signed)
Reviewed health habits including diet and exercise and skin cancer prevention Reviewed appropriate screening tests for age  Also reviewed health mt list, fam hx and immunization status , as well as social and family history   See HPI Labs reviewed and ordered Flu vaccine given  Declines shingrix  Mammo due in dec  Discussed fall prevention, supplements and exercise for bone density  PHQ 0  Health Maintenance  Topic Date Due   Zoster (Shingles) Vaccine (1 of 2) 09/21/2023*   COVID-19 Vaccine (3 - Moderna risk series) 07/06/2025*   Hepatitis C Screening  11/25/2027*   HIV Screening  11/25/2027*   Mammogram  07/12/2023   DTaP/Tdap/Td vaccine (3 - Tdap) 04/07/2027   Colon Cancer Screening  07/04/2032   Flu Shot  Completed   HPV Vaccine  Aged Out  *Topic was postponed. The date shown is not the original due date.

## 2023-06-23 NOTE — Assessment & Plan Note (Signed)
Aic today  Working on weight loss at the [facility] clinic  disc imp of low glycemic diet and wt loss to prevent DM2

## 2023-06-23 NOTE — Assessment & Plan Note (Signed)
Last vitamin D Lab Results  Component Value Date   VD25OH 56.4 10/24/2022   Vitamin D level is therapeutic with current supplementation Disc importance of this to bone and overall health

## 2023-06-23 NOTE — Assessment & Plan Note (Signed)
Disc goals for lipids and reasons to control them Rev last labs with pt Rev low sat fat diet in detail Consider statin if not improved this draw Watching diet

## 2023-06-23 NOTE — Assessment & Plan Note (Signed)
Continues sertraline 25 mg daily  Doing better overall Encouraged good self care

## 2023-06-23 NOTE — Assessment & Plan Note (Addendum)
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

## 2023-06-23 NOTE — Assessment & Plan Note (Signed)
Colonoscopy due 06/2032

## 2023-06-23 NOTE — Assessment & Plan Note (Signed)
Planning follow up with ortho  Limits activity at times  Working on weight loss Encouraged low impact activities like cycling and water exercise

## 2023-06-23 NOTE — Assessment & Plan Note (Signed)
Overall doing better lately  Continues sertraline 25 mg daily   Encouraged good self care

## 2023-06-23 NOTE — Assessment & Plan Note (Signed)
bp in fair control at this time  BP Readings from Last 1 Encounters:  06/21/23 128/82   No changes needed Most recent labs reviewed  Disc lifstyle change with low sodium diet and exercise  Plan to continue amlodipine 2.5 mg daily

## 2023-06-23 NOTE — Assessment & Plan Note (Signed)
Has seen GI ? If lipoma on left abdomen- difficult to tell  CT 01/2022 was reassuring

## 2023-06-23 NOTE — Assessment & Plan Note (Signed)
More abdomen than ribs at this time

## 2023-06-23 NOTE — Assessment & Plan Note (Signed)
Working on weight loss  Lab Results  Component Value Date   ALT 11 06/21/2023   AST 11 06/21/2023   ALKPHOS 103 06/21/2023   BILITOT 0.3 06/21/2023   Reassuring labs

## 2023-06-24 ENCOUNTER — Ambulatory Visit (INDEPENDENT_AMBULATORY_CARE_PROVIDER_SITE_OTHER): Payer: Managed Care, Other (non HMO) | Admitting: Physician Assistant

## 2023-06-24 ENCOUNTER — Encounter (INDEPENDENT_AMBULATORY_CARE_PROVIDER_SITE_OTHER): Payer: Self-pay | Admitting: Physician Assistant

## 2023-06-24 VITALS — BP 108/74 | HR 84 | Temp 97.5°F | Ht 64.0 in | Wt 204.0 lb

## 2023-06-24 DIAGNOSIS — M25561 Pain in right knee: Secondary | ICD-10-CM

## 2023-06-24 DIAGNOSIS — E559 Vitamin D deficiency, unspecified: Secondary | ICD-10-CM

## 2023-06-24 DIAGNOSIS — Z6835 Body mass index (BMI) 35.0-35.9, adult: Secondary | ICD-10-CM

## 2023-06-24 DIAGNOSIS — G8929 Other chronic pain: Secondary | ICD-10-CM

## 2023-06-24 DIAGNOSIS — E785 Hyperlipidemia, unspecified: Secondary | ICD-10-CM | POA: Diagnosis not present

## 2023-06-24 DIAGNOSIS — R7303 Prediabetes: Secondary | ICD-10-CM | POA: Diagnosis not present

## 2023-06-24 DIAGNOSIS — E669 Obesity, unspecified: Secondary | ICD-10-CM

## 2023-06-24 DIAGNOSIS — I10 Essential (primary) hypertension: Secondary | ICD-10-CM | POA: Diagnosis not present

## 2023-06-24 DIAGNOSIS — M25562 Pain in left knee: Secondary | ICD-10-CM

## 2023-06-24 MED ORDER — METFORMIN HCL 500 MG PO TABS: 500.0000 mg | ORAL_TABLET | Freq: Two times a day (BID) | ORAL | 0 refills | Status: DC

## 2023-06-24 NOTE — Progress Notes (Signed)
SUBJECTIVE:  Chief Complaint: Obesity  Interim History: Makayla [name] is a 62 yo female with a history of obesity, prediabetes, hypertension, hypercholesterolemia, and vitamin D deficiency, presents for a follow-up visit regarding her obesity treatment plan. She is currently on Norvasc 2.5 mg daily, cholecalciferol 5000 units daily, and metformin 500 mg daily.  The patient reports a recent knee injury, which she attributes to a walking exercise regimen. The injury has caused some hindrance to her exercise routine. The patient has a history of knee problems dating back to 2017 and plans to consult with an orthopedic specialist, Dr. Roda Shutters, for further evaluation and treatment. The patient describes the knee pain as a popping sensation followed by a radiating pain, akin to lightning bolts, down the leg, leading to temporary numbness.  The patient has been tolerating metformin well, with no reported stomach upset or diarrhea. Recent labs showed elevated cholesterol levels, which the patient was not fasting for at the time of the test. The patient denies significant consumption of high cholesterol foods and is unsure of the cause of the elevated cholesterol. The patient's hemoglobin A1c is 6.1, and she has been on metformin for several months.  The patient reports occasional episodes of abdominal discomfort, which she suspects may be related to diverticulitis. She describes the pain as being more significant on the left lower abdomen. The patient has a history of diverticulosis but has never had diverticulitis per se. The patient also reports a recent episode of stomach upset after consuming tomato soup, which she believes may have aggravated her stomach condition.  The patient is making efforts to manage her weight and has noticed changes in how her clothes fit. She reports some evenings where she is not hungry and does not eat.  We discussed the importance of regular meals and adequate protein intake for  weight loss and overall health. The patient is aiming for a daily caloric intake of around 1200 calories and is considering increasing her protein intake. She has been using a nutrition tracking app to monitor her food intake. We noted an increase in muscle mass, which she attributes to her exercise regimen but is switching to some chair exercises due to her knee pain.   Makayla [name] is here to discuss her progress with her obesity treatment plan. She is on the Category 2 Plan and keeping a food journal and adhering to recommended goals of 1100-1200 calories and 85 grams of protein and states she is following her eating plan approximately 50 % of the time. She states she is exercising but was limited as her knee has been causing pain.    OBJECTIVE: Visit Diagnoses: Problem List Items Addressed This Visit     Hyperlipidemia LDL goal <130   Vitamin D deficiency   Essential hypertension   Pre-diabetes - Primary   Relevant Medications   metFORMIN (GLUCOPHAGE) 500 MG tablet   Bilateral knee pain   BMI 35.0-35.9,adult   Morbid obesity (HCC) with starting BMI 37 (Chronic)   Relevant Medications   metFORMIN (GLUCOPHAGE) 500 MG tablet    Vitals Temp: (!) 97.5 F (36.4 C) BP: 108/74 Pulse Rate: 84 SpO2: 96 %   Anthropometric Measurements Height: 5\' 4"  (1.626 m) Weight: 204 lb (92.5 kg) BMI (Calculated): 35 Weight at Last Visit: 202 lb Weight Lost Since Last Visit: 0 Weight Gained Since Last Visit: 2 lb Starting Weight: 220 lb Total Weight Loss (lbs): 16 lb (7.258 kg) Peak Weight: 222 lb Waist Measurement : 42 inches   Body  Composition  Body Fat %: 44.4 % Fat Mass (lbs): 91 lbs Muscle Mass (lbs): 108 lbs Total Body Water (lbs): 74.4 lbs Visceral Fat Rating : 13   Other Clinical Data RMR: 1512 Fasting: no Labs: no Today's Visit #: 10 Starting Date: 10/24/22  General: She is overweight, cooperative, alert, well developed, and in no acute distress. PSYCH: Has normal mood, affect  and thought process.   Cardiovascular: HR 80's, BP 05/13/73 Lungs: Normal breathing effort, no conversational dyspnea. Neuro: no focal deficits   Obesity Compliant with dietary recommendations but limited physical activity due to knee pain. Discussed impact of insulin resistance on fat storage and weight management. Emphasized importance of meeting calorie and protein goals to avoid under-eating. - Continue current dietary recommendations - Encourage use of chair exercises - Monitor weight and muscle mass - Increase metformin to 500 mg twice daily - If gastrointestinal side effects occur, reduce evening dose to half a pill and titrate up as tolerated - Use portion control strategies during holiday meals - Consider Fairlife milk for additional protein intake - Ensure daily intake of 1200 calories and 85 grams of protein  Prediabetes Hemoglobin A1c is 6.1% increased from previous and not at goal.  Insulin resistance noted with previous insulin level of 16.1. Aim to reduce A1c below prediabetic range. Discussed potential benefits of increasing metformin to twice daily to improve insulin sensitivity and aid in weight management. - Increase/Refill metformin to 500 mg twice daily - Order insulin level test after the holidays  Hypertension Well-managed with Norvasc 2.5 mg daily. No reported issues with current medication. BP Readings from Last 3 Encounters:  06/24/23 108/74  06/21/23 128/82  06/14/23 119/77   - Continue Norvasc 2.5 mg daily Continue to work on nutrition plan to promote weight loss and improve BP control.   Hypercholesterolemia Recent labs were non-fasting. Discussed potential need for statin therapy and alternative dosing schedules. Postmenopausal status may contribute to elevated cholesterol levels. Intermittent statin dosing can be effective and may be an option if concerned about daily medication. - Repeat fasting cholesterol panel at the beginning of the year - Focus  on avoiding saturated fats - Consider statin therapy if cholesterol remains elevated  Vitamin D Deficiency Managed with cholecalciferol 5000 units daily. - Continue cholecalciferol 5000 units daily Low vitamin D levels can be associated with adiposity and may result in leptin resistance and weight gain. Also associated with fatigue. Currently on vitamin D supplementation without any adverse effects.  Recheck vitamin D levels early next year to optimize supplementation.   Knee Pain Chronic knee pain since 2017, possibly due to a meniscal tear. Pain exacerbated by certain movements. Previous relief with steroid injections. Discussed potential benefits of steroid injections for current symptoms. - Follow up with Dr. Roda Shutters for knee evaluation and possible steroid injection   General Health Maintenance Discussed strategies for managing diet during the holidays and ensuring adequate protein intake. Emphasized the importance of meeting calorie and protein goals to avoid under-eating. - Use portion control strategies during holiday meals - Consider Fairlife milk for additional protein intake - Ensure daily intake of 1200 calories and 85 grams of protein  Follow-up - Repeat fasting cholesterol panel and insulin levels as well as other labs at the beginning of the year - Order insulin level test after the holidays - Follow up with Dr. Roda Shutters for knee evaluation - Monitor weight and muscle mass at next visit.    ASSESSMENT AND PLAN:  Diet: Makayla [name] is currently in the  action stage of change. As such, her goal is to continue with weight loss efforts. She has agreed to keeping a food journal and adhering to recommended goals of 1100-1200 calories and 85 grams of protein.  Exercise: Makayla [name] has been instructed to try a geriatric exercise plan and that some exercise is better than none for weight loss and overall health benefits.   Behavior Modification:  We discussed the following Behavioral Modification  Strategies today: increasing lean protein intake, decreasing simple carbohydrates, increasing vegetables, increase H2O intake, increase high fiber foods, no skipping meals, and holiday eating strategies. We discussed various medication options to help Makayla [name] with her weight loss efforts and we both agreed to increase metformin to 500 mg twice daily.  Return in about 4 weeks (around 07/22/2023).Marland Kitchen She was informed of the importance of frequent follow up visits to maximize her success with intensive lifestyle modifications for her multiple health conditions.  Attestation Statements:   Reviewed by clinician on day of visit: allergies, medications, problem list, medical history, surgical history, family history, social history, and previous encounter notes.   Time spent on visit including pre-visit chart review and post-visit care and charting was 52 minutes.    Makayla Curenton, PA-C

## 2023-06-25 ENCOUNTER — Telehealth: Payer: Self-pay | Admitting: *Deleted

## 2023-06-25 NOTE — Telephone Encounter (Signed)
-----   Message from Roscoe sent at 06/23/2023 10:19 AM EST ----- Please let pt know I did a chart review for abdominal symptoms including last CT and EGD I recommend follow up with GI tfor a visit to discuss her pain again

## 2023-06-25 NOTE — Telephone Encounter (Signed)
Left VM requesting pt to call the office back 

## 2023-07-02 ENCOUNTER — Encounter: Payer: Self-pay | Admitting: Orthopaedic Surgery

## 2023-07-02 ENCOUNTER — Ambulatory Visit: Payer: Managed Care, Other (non HMO) | Admitting: Orthopaedic Surgery

## 2023-07-02 VITALS — Ht 64.0 in | Wt 208.0 lb

## 2023-07-02 DIAGNOSIS — M25562 Pain in left knee: Secondary | ICD-10-CM | POA: Diagnosis not present

## 2023-07-02 DIAGNOSIS — G8929 Other chronic pain: Secondary | ICD-10-CM

## 2023-07-02 IMAGING — CT CT ABD-PELV W/ CM
2 of 5 series · 15 of 46 positions shown, 17 images · IV contrast (APPLIED)
Comparison: 04/04/2020 CT abdomen/pelvis.

CLINICAL DATA: Left upper quadrant abdominal pain for 2 days.
Diverticulitis suspected.

EXAM:
CT ABDOMEN AND PELVIS WITH CONTRAST
TECHNIQUE: Multidetector CT imaging of the abdomen and pelvis was performed
using the standard protocol following bolus administration of
intravenous contrast.
CONTRAST:  100mL OMNIPAQUE IOHEXOL 350 MG/ML SOLN

[Series 2: routine abd/pel with · axial · 0.78mm/px · z∈[-847,-402]mm · 12 of 99 slices shown, 14 images]
[im 5/99  soft-tissue]
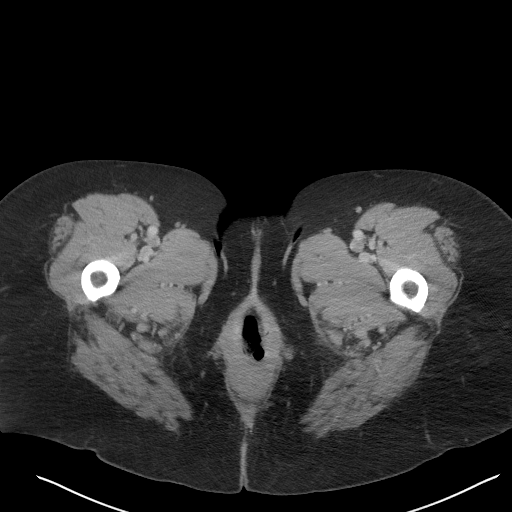
[im 5/99  bone]
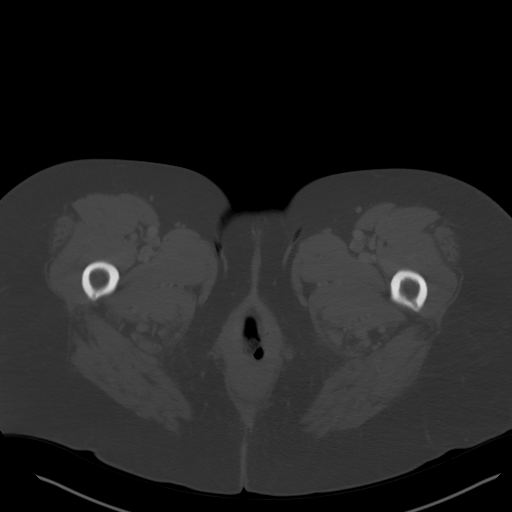
[im 15/99  soft-tissue]
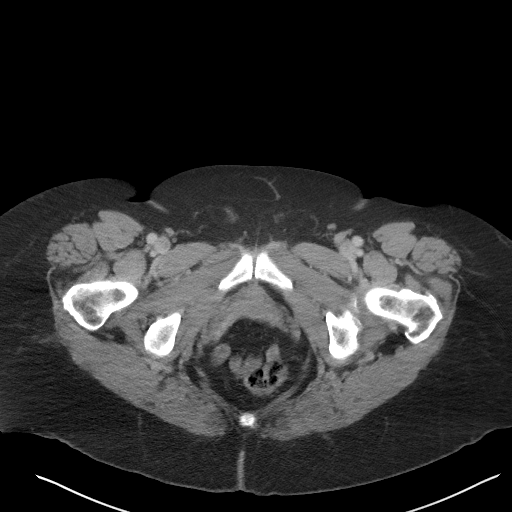
[im 20/99  soft-tissue]
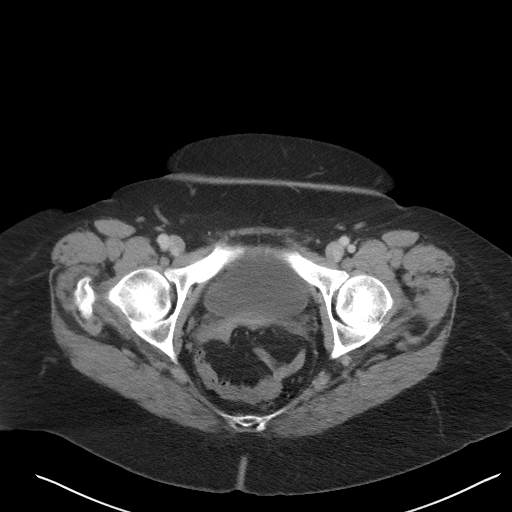
[im 30/99  soft-tissue]
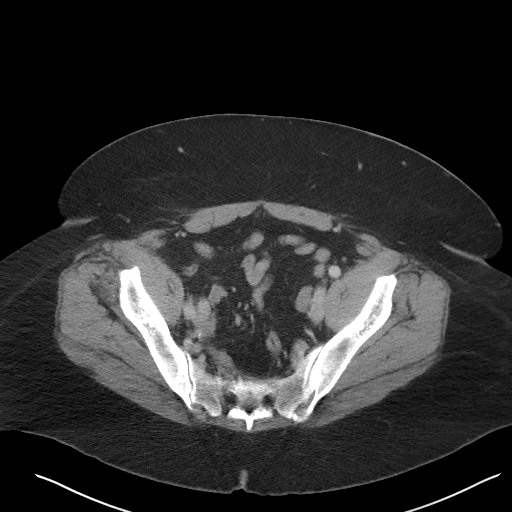
[im 40/99  soft-tissue]
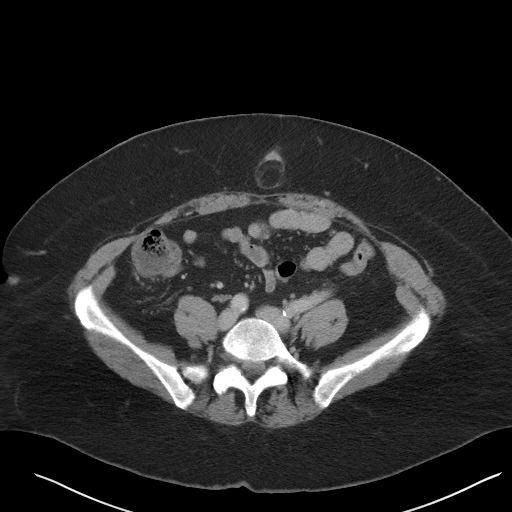
[im 45/99  soft-tissue]
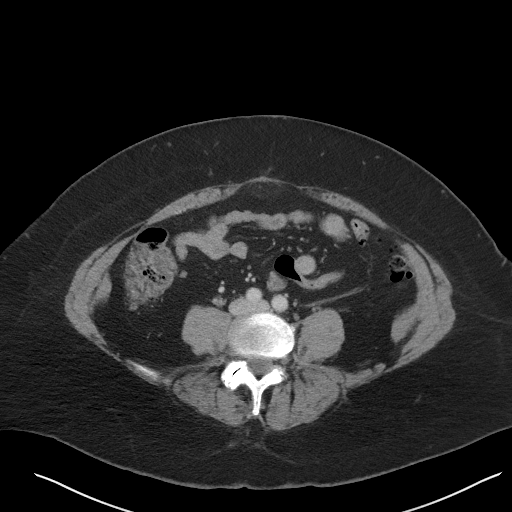
[im 54/99  soft-tissue]
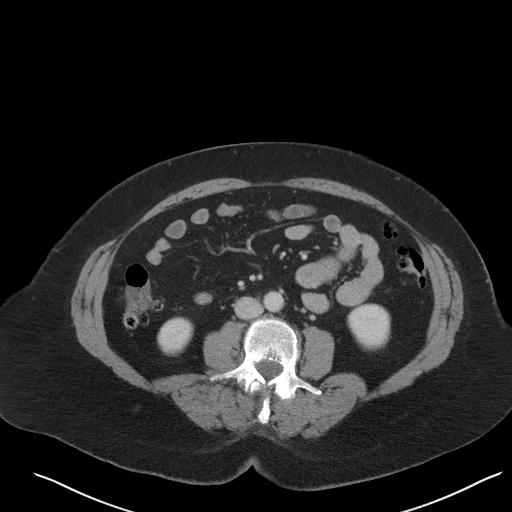
[im 59/99  soft-tissue]
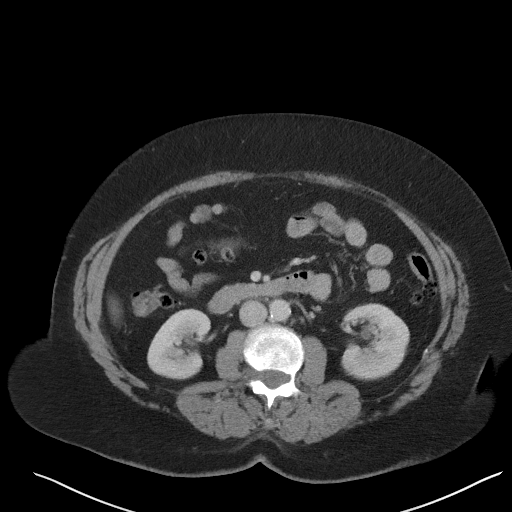
[im 69/99  soft-tissue]
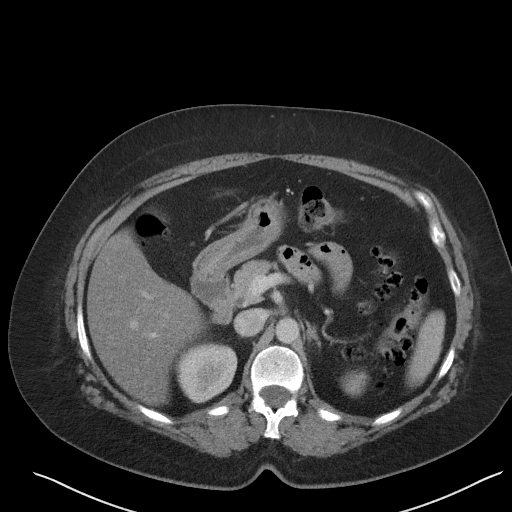
[im 69/99  bone]
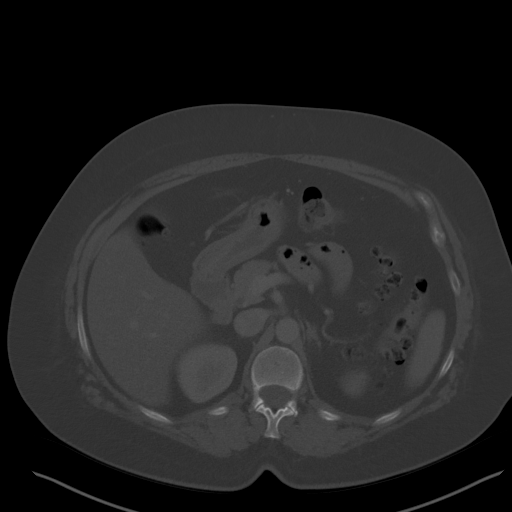
[im 79/99  soft-tissue]
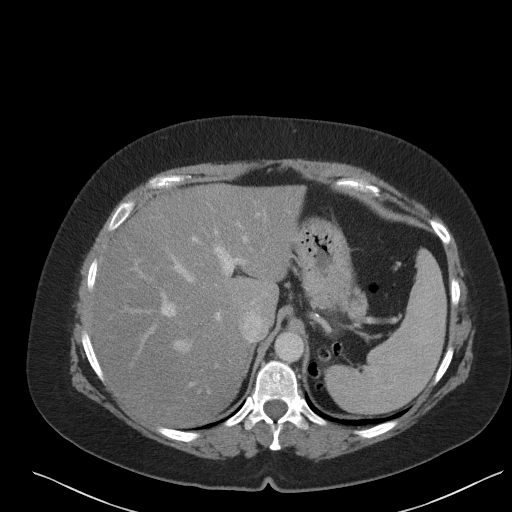
[im 84/99  soft-tissue]
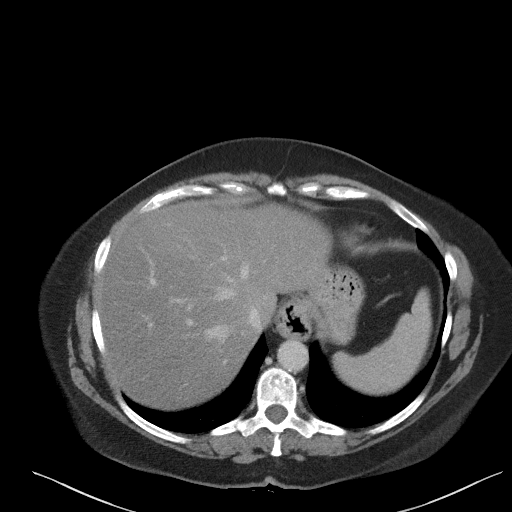
[im 94/99  soft-tissue]
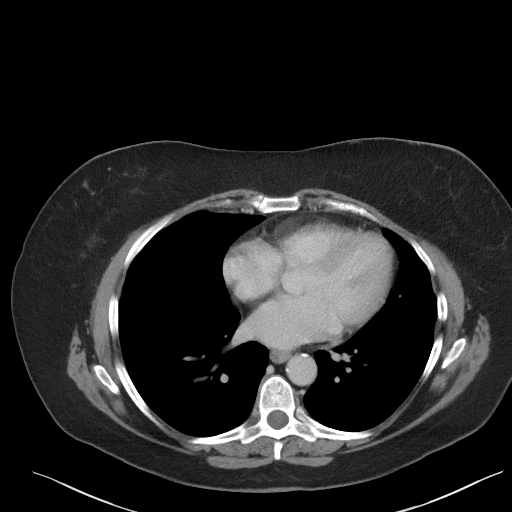

[Series 5: coronal st · coronal · 0.74mm/px · 3 of 98 slices shown]
[im 33/98  soft-tissue]
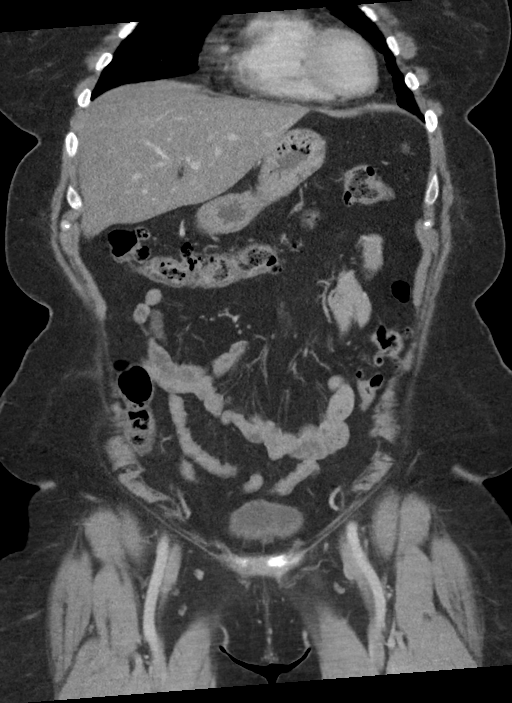
[im 44/98  soft-tissue]
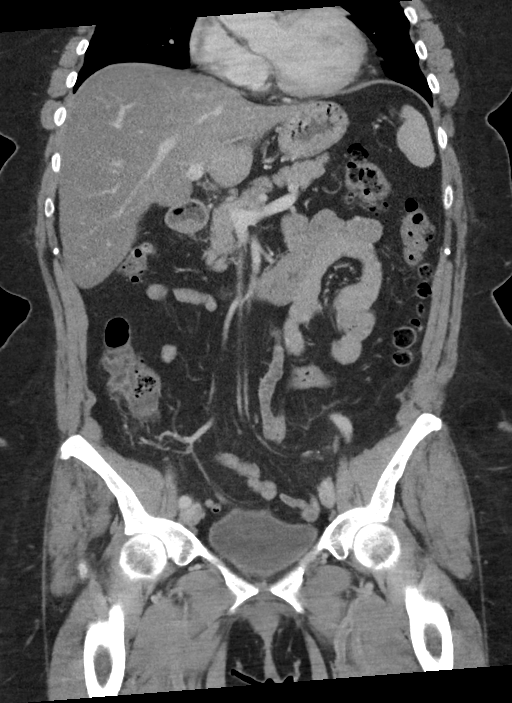
[im 54/98  soft-tissue]
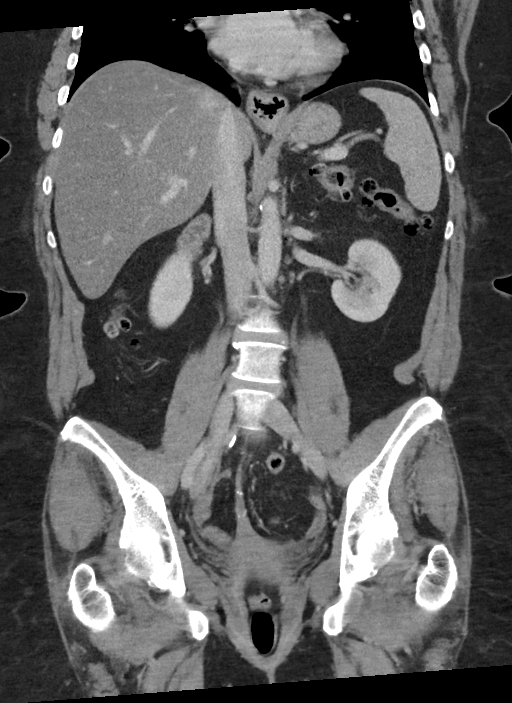

[15 of 46 positions shown; findings below may reference images not displayed]

FINDINGS: Lower chest: No significant pulmonary nodules or acute consolidative
airspace disease.

Hepatobiliary: Diffuse hepatic steatosis. Normal liver size. No
definite liver surface irregularity. No liver masses.
Cholecystectomy. No biliary ductal dilatation.

Pancreas: Normal, with no mass or duct dilation.

Spleen: Normal size. No mass.

Adrenals/Urinary Tract: Normal right adrenal. Hypodense 1.2 cm left
adrenal nodule with density 41 HU, stable since 04/04/2020 CT,
compatible with a benign adenoma. Simple 2.6 cm interpolar left
renal cyst. Otherwise normal kidneys, with no hydronephrosis. Normal
bladder.

Stomach/Bowel: Small to moderate hiatal hernia. Otherwise normal
nondistended stomach. Small chronic enterocele and rectocele. No
dilated or thick-walled small bowel loops. Normal appendix. Marked
diffuse colonic diverticulosis with no definite large bowel wall
thickening or significant pericolonic fat stranding.

Vascular/Lymphatic: Atherosclerotic nonaneurysmal abdominal aorta.
Patent portal, splenic, hepatic and renal veins. No pathologically
enlarged lymph nodes in the abdomen or pelvis.

Reproductive: Status post hysterectomy, with no abnormal findings at
the vaginal cuff. No adnexal mass.

Other: No pneumoperitoneum, ascites or focal fluid collection. Small
chronic fat containing umbilical hernia.

Musculoskeletal: No aggressive appearing focal osseous lesions. Mild
thoracolumbar spondylosis.
IMPRESSION: 1. No acute abnormality. No evidence of bowel obstruction or acute
bowel inflammation. Marked diffuse colonic diverticulosis, with no
evidence of acute diverticulitis.
2. Small chronic enterocele and rectocele.
3. Diffuse hepatic steatosis.
4. Small to moderate hiatal hernia.
5. Stable left adrenal adenoma.
6. Aortic Atherosclerosis (CHE8P-7RD.D).

## 2023-07-02 NOTE — Progress Notes (Signed)
Office Visit Note   Patient: Makayla [name]           Date of Birth: Jun 09, 1961           MRN: [id number] Visit Date: 07/02/2023              Requested by: Tarry Kos, MD [address] [place],  [place] [address] PCP: Judy Pimple, MD   Assessment & Plan: Visit Diagnoses:  1. Chronic pain of left knee     Plan: Kimmerly is a 62 year old female with posterior lateral knee pain that has not gotten better with injections or home exercise program.  We will order an MRI of the left knee to rule out structural abnormalities.  Follow-up after the MRI.  Follow-Up Instructions: No follow-ups on file.   Orders:  No orders of the defined types were placed in this encounter.  No orders of the defined types were placed in this encounter.     Procedures: No procedures performed   Clinical Data: No additional findings.   Subjective: Chief Complaint  Patient presents with   Left Knee - Pain    HPI Makayla [name] is a 61 year old female comes in for follow-up evaluation of left knee pain.  States that she has had pain since she had an injury in 2017 in which she felt a pop.  Since then she feels posterior lateral knee pain when she is stepping up.  It feels unstable.  She had cortisone injections about 6 months ago which helped with the arthritic knee pain but did not help with these particular symptoms.  Denies any swelling or mechanical symptoms.  Review of Systems  Constitutional: Negative.   HENT: Negative.    Eyes: Negative.   Respiratory: Negative.    Cardiovascular: Negative.   Endocrine: Negative.   Musculoskeletal: Negative.   Neurological: Negative.   Hematological: Negative.   Psychiatric/Behavioral: Negative.    All other systems reviewed and are negative.    Objective: Vital Signs: Ht 5\' 4"  (1.626 m)   Wt 208 lb (94.3 kg)   BMI 35.70 kg/m   Physical Exam Vitals and nursing note reviewed.  Constitutional:      Appearance: She is well-developed.  HENT:      Head: Normocephalic and atraumatic.  Pulmonary:     Effort: Pulmonary effort is normal.  Abdominal:     Palpations: Abdomen is soft.  Musculoskeletal:     Cervical back: Neck supple.  Skin:    General: Skin is warm.     Capillary Refill: Capillary refill takes less than 2 seconds.  Neurological:     Mental Status: She is alert and oriented to person, place, and time.  Psychiatric:        Behavior: Behavior normal.        Thought Content: Thought content normal.        Judgment: Judgment normal.     Ortho Exam Exam of the left knee shows no reproducible tenderness in the posterior lateral aspect of the knee.  Collaterals and cruciates are stable.  No joint line tenderness.  Normal range of motion.  No joint effusion. Specialty Comments:  No specialty comments available.  Imaging: No results found.   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   Grief reaction 10/03/2018   Generalized anxiety disorder 01/23/2017   Vitamin D deficiency 05/20/2015   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   Morbid obesity (HCC) with starting BMI 37 10/25/2011   Routine general medical examination at a health care facility 10/16/2011   Hyperlipidemia LDL goal <130 12/04/2007   GERD 12/04/2007   Essential hypertension 12/04/2007   Past Medical History:  Diagnosis Date   Allergy    pollen   Anxiety    Bursitis,  trochanteric    bi/lat    Dental crowns present    x 4   Diverticulosis    Fatty liver    GERD (gastroesophageal reflux disease)    Hyperlipidemia    no current med.   Hypertension    white coat syndrome,   Mass of breast, left 01/2012   Obesity    Palpitations    PONV (postoperative nausea and vomiting)    Pre-diabetes    Pre-diabetes    Vitamin D deficiency     Family History  Problem Relation Age of Onset   Hypertension Mother    Hyperlipidemia Mother    Diabetes Mother    Breast cancer Mother 39   Uterine cancer Mother    GER disease Father    Depression Sister    Heart disease Brother    Heart attack Maternal Grandmother    Hypertension Maternal Grandmother    Colon cancer Neg Hx    Colon polyps Neg Hx    Esophageal cancer Neg Hx    Rectal cancer Neg Hx    Stomach cancer Neg Hx     Past Surgical History:  Procedure Laterality Date   ABDOMINAL HYSTERECTOMY     partial   ANTERIOR AND POSTERIOR REPAIR WITH SACROSPINOUS FIXATION N/A 08/20/2022   Procedure: POSTERIOR REPAIR WITH PERINEORRHAPHY AND WITH  SACROSPINOUS FIXATION;  Surgeon: Marguerita Beards, MD;  Location: [facility];  Service: Gynecology;  Laterality: N/A;   BREAST BIOPSY Right 08/04/2021   BREAST EXCISIONAL BIOPSY Left 2013   BREAST SURGERY  01/10/2012   lumpectomy - left (excisional biopsy benign)   CHOLECYSTECTOMY N/A 02/10/2016   Procedure: LAPAROSCOPIC CHOLECYSTECTOMY WITH INTRAOPERATIVE CHOLANGIOGRAM;  Surgeon: Chevis Pretty III, MD;  Location: [facility];  Service: General;  Laterality: N/A;   COLONOSCOPY  04/06/2010   D Brodie, normal w/tics   RECTOCELE REPAIR  06/14/2011   cystocele repair, pt states Dr Jacquelyne Balint did not do rectocele and said she had to come back later.   ROBOTIC ASSISTED LAPAROSCOPIC SACROCOLPOPEXY  06/14/2011   Procedure: ROBOTIC ASSISTED LAPAROSCOPIC SACROCOLPOPEXY;  Surgeon: Crecencio Mc, MD;  Location: [facility];  Service: Urology;  Laterality: N/A;   TUBAL  LIGATION     Social History   Occupational History   Occupation: RN-retired  Tobacco Use   Smoking status: Never    Passive exposure: Past   Smokeless tobacco: Never  Vaping Use   Vaping status: Never Used  Substance and Sexual Activity   Alcohol use: No    Alcohol/week: 0.0 standard drinks of alcohol   Drug use: No   Sexual activity: Not Currently    Birth control/protection: Post-menopausal    Comment: has used estradiol patch 0.025  in the past, has stopped

## 2023-07-02 NOTE — Addendum Note (Signed)
Addended by: Wendi Maya on: 07/02/2023 08:22 AM   Modules accepted: Orders

## 2023-07-03 NOTE — Telephone Encounter (Signed)
Left VM requesting pt to call the office back and also sent a mychart message

## 2023-07-08 NOTE — Telephone Encounter (Signed)
See pt's mychart message

## 2023-07-15 ENCOUNTER — Ambulatory Visit
Admission: RE | Admit: 2023-07-15 | Discharge: 2023-07-15 | Disposition: A | Discharge status: Home / Self Care | Payer: Managed Care, Other (non HMO) | Source: Ambulatory Visit

## 2023-07-15 DIAGNOSIS — Z1231 Encounter for screening mammogram for malignant neoplasm of breast: Secondary | ICD-10-CM

## 2023-07-19 ENCOUNTER — Ambulatory Visit: Payer: Managed Care, Other (non HMO) | Admitting: Obstetrics and Gynecology

## 2023-07-24 ENCOUNTER — Ambulatory Visit (INDEPENDENT_AMBULATORY_CARE_PROVIDER_SITE_OTHER): Payer: Managed Care, Other (non HMO) | Admitting: Physician Assistant

## 2023-07-25 ENCOUNTER — Other Ambulatory Visit: Payer: Self-pay | Admitting: Orthopaedic Surgery

## 2023-07-25 DIAGNOSIS — G8929 Other chronic pain: Secondary | ICD-10-CM

## 2023-08-06 ENCOUNTER — Ambulatory Visit: Payer: Managed Care, Other (non HMO) | Admitting: Physician Assistant

## 2023-08-06 DIAGNOSIS — M25562 Pain in left knee: Secondary | ICD-10-CM | POA: Diagnosis not present

## 2023-08-06 DIAGNOSIS — G8929 Other chronic pain: Secondary | ICD-10-CM

## 2023-08-06 MED ORDER — METHYLPREDNISOLONE ACETATE 40 MG/ML IJ SUSP
40.0000 mg | INTRAMUSCULAR | Status: AC | PRN
  Administered 2023-08-06: 40 mg via INTRA_ARTICULAR

## 2023-08-06 MED ORDER — BUPIVACAINE HCL 0.25 % IJ SOLN
2.0000 mL | INTRAMUSCULAR | Status: AC | PRN
  Administered 2023-08-06: 2 mL via INTRA_ARTICULAR

## 2023-08-06 MED ORDER — LIDOCAINE HCL 1 % IJ SOLN
2.0000 mL | INTRAMUSCULAR | Status: AC | PRN
  Administered 2023-08-06: 2 mL

## 2023-08-06 NOTE — Progress Notes (Signed)
 Office Visit Note   Patient: Makayla [name]           Date of Birth: 27-May-1961           MRN: [id number] Visit Date: 08/06/2023              Requested by: Tower, Laine DELENA, MD [address] [place],  [place] [address] PCP: Randeen Laine DELENA, MD   Assessment & Plan: Visit Diagnoses:  1. Chronic pain of left knee     Plan: Impression is chronic left knee pain following injury in 2017.  We have discussed proceeding with cortisone injection today versus waiting to see what her MRI results show.  She would like to go ahead and proceed with injection today so she can hopefully return to work next week.  She will follow-up with Dr. Jerri to discuss left knee MRI once this has been completed.  Follow-Up Instructions: Return for f/u after MRI.   Orders:  No orders of the defined types were placed in this encounter.  No orders of the defined types were placed in this encounter.     Procedures: Large Joint Inj: L knee on 08/06/2023 8:41 AM Indications: pain Details: 22 G needle, anterolateral approach Medications: 2 mL lidocaine  1 %; 2 mL bupivacaine  0.25 %; 40 mg methylPREDNISolone  acetate 40 MG/ML      Clinical Data: No additional findings.   Subjective: Chief Complaint  Patient presents with   Left Knee - Pain    HPI patient is a very pleasant 62 year old female who comes in today with chronic left knee pain.  She has been experiencing knee pain since sustaining an injury back in 2017.  Since then she has had pain to the posterior lateral knee.  She is undergone cortisone injections with the last 1 being in May of this past year with only temporary relief.  She is scheduled for an left knee MRI in about a week.  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  Her pain is limiting her at this time.  Review of Systems as detailed in HPI.  All others reviewed and are negative.   Objective: Vital Signs: There were no vitals taken for this  visit.  Physical Exam well-developed well-nourished female no acute distress.  Alert and oriented x 3.  Ortho Exam left knee exam: Unchanged  Specialty Comments:  No specialty comments available.  Imaging: No new imaging   PMFS History: Patient Active Problem List   Diagnosis Date Noted   BMI 35.0-35.9,adult 03/28/2023   Osteoarthritis, knee 11/28/2022   Bilateral knee pain 11/23/2022   Hepatic steatosis 11/07/2022   Other fatigue 10/24/2022   GAD (generalized anxiety disorder) 10/24/2022   Pre-diabetes 10/24/2022   Other hyperlipidemia 10/24/2022   Depression screen 10/24/2022   Hemorrhoids 06/07/2022   Abdominal pain, left upper quadrant 02/01/2022   Rib pain on left side 06/13/2020   Colon cancer screening 05/06/2020   Insomnia, psychophysiological 03/14/2020   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   Grief reaction 10/03/2018   Generalized anxiety disorder 01/23/2017   Vitamin D  deficiency 05/20/2015   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   Morbid obesity (HCC) with starting BMI 37 10/25/2011   Routine general medical examination at a health care facility 10/16/2011   Hyperlipidemia LDL goal <130 12/04/2007   GERD 12/04/2007   Essential hypertension 12/04/2007   Past Medical History:  Diagnosis Date  Allergy    pollen   Anxiety    Bursitis, trochanteric    bi/lat    Dental crowns present    x 4   Diverticulosis    Fatty liver    GERD (gastroesophageal reflux disease)    Hyperlipidemia    no current med.   Hypertension    white coat syndrome,   Mass of breast, left 01/2012   Obesity    Palpitations    PONV (postoperative nausea and vomiting)    Pre-diabetes    Pre-diabetes    Vitamin D  deficiency     Family History  Problem Relation Age of Onset   Hypertension Mother    Hyperlipidemia Mother    Diabetes Mother    Breast cancer Mother 65   Uterine cancer Mother    GER disease Father    Depression Sister     Heart disease Brother    Heart attack Maternal Grandmother    Hypertension Maternal Grandmother    Colon cancer Neg Hx    Colon polyps Neg Hx    Esophageal cancer Neg Hx    Rectal cancer Neg Hx    Stomach cancer Neg Hx     Past Surgical History:  Procedure Laterality Date   ABDOMINAL HYSTERECTOMY     partial   ANTERIOR AND POSTERIOR REPAIR WITH SACROSPINOUS FIXATION N/A 08/20/2022   Procedure: POSTERIOR REPAIR WITH PERINEORRHAPHY AND WITH  SACROSPINOUS FIXATION;  Surgeon: Marilynne Rosaline SAILOR, MD;  Location: [facility] [place];  Service: Gynecology;  Laterality: N/A;   BREAST BIOPSY Right 08/04/2021   BREAST EXCISIONAL BIOPSY Left 2013   BREAST SURGERY  01/10/2012   lumpectomy - left (excisional biopsy benign)   CHOLECYSTECTOMY N/A 02/10/2016   Procedure: LAPAROSCOPIC CHOLECYSTECTOMY WITH INTRAOPERATIVE CHOLANGIOGRAM;  Surgeon: Deward Null III, MD;  Location: [facility];  Service: General;  Laterality: N/A;   COLONOSCOPY  04/06/2010   D Brodie, normal w/tics   RECTOCELE REPAIR  06/14/2011   cystocele repair, pt states Dr Marykay did not do rectocele and said she had to come back later.   ROBOTIC ASSISTED LAPAROSCOPIC SACROCOLPOPEXY  06/14/2011   Procedure: ROBOTIC ASSISTED LAPAROSCOPIC SACROCOLPOPEXY;  Surgeon: Noretta Ferrara, MD;  Location: [facility];  Service: Urology;  Laterality: N/A;   TUBAL LIGATION     Social History   Occupational History   Occupation: RN-retired  Tobacco Use   Smoking status: Never    Passive exposure: Past   Smokeless tobacco: Never  Vaping Use   Vaping status: Never Used  Substance and Sexual Activity   Alcohol use: No    Alcohol/week: 0.0 standard drinks of alcohol   Drug use: No   Sexual activity: Not Currently    Birth control/protection: Post-menopausal    Comment: has used estradiol  patch 0.025  in the past, has stopped

## 2023-08-15 ENCOUNTER — Ambulatory Visit
Admission: RE | Admit: 2023-08-15 | Discharge: 2023-08-15 | Disposition: A | Discharge status: Home / Self Care | Payer: Managed Care, Other (non HMO) | Source: Ambulatory Visit | Attending: Orthopaedic Surgery | Admitting: Orthopaedic Surgery

## 2023-08-15 DIAGNOSIS — G8929 Other chronic pain: Secondary | ICD-10-CM

## 2023-08-18 NOTE — Progress Notes (Signed)
 Needs f/u appt

## 2023-08-19 NOTE — Progress Notes (Signed)
 Tried to call. No answer. LMOM for patient to call back to schedule follow up for MRI results.

## 2023-08-23 ENCOUNTER — Ambulatory Visit: Payer: Managed Care, Other (non HMO) | Admitting: Orthopaedic Surgery

## 2023-08-23 DIAGNOSIS — M1712 Unilateral primary osteoarthritis, left knee: Secondary | ICD-10-CM | POA: Diagnosis not present

## 2023-08-23 NOTE — Progress Notes (Signed)
Office Visit Note   Patient: Makayla [name]           Date of Birth: [birth year]           MRN: [id number] Visit Date: 08/23/2023              Requested by: Tower, Audrie Gallus, MD [address] [place],  [place] [address] PCP: Judy Pimple, MD   Assessment & Plan: Visit Diagnoses:  1. Primary osteoarthritis of left knee     Plan: Joyceann's MRI essentially shows end-stage tricompartmental DJD.  Based on these options we discussed performing a total knee arthroplasty as the most reliable option for pain relief.  Risk benefits prognosis reviewed.  Handout provided.  She will talk to her daughter to help coordinate postop care.  She will think about her options and call us back when she is ready to schedule surgery.  Follow-Up Instructions: No follow-ups on file.   Orders:  No orders of the defined types were placed in this encounter.  No orders of the defined types were placed in this encounter.     Procedures: No procedures performed   Clinical Data: No additional findings.   Subjective: Chief Complaint  Patient presents with   Left Knee - Pain    HPI Crystale returns today to discuss left knee MRI scan. Review of Systems  Constitutional: Negative.   HENT: Negative.    Eyes: Negative.   Respiratory: Negative.    Cardiovascular: Negative.   Endocrine: Negative.   Musculoskeletal: Negative.   Neurological: Negative.   Hematological: Negative.   Psychiatric/Behavioral: Negative.    All other systems reviewed and are negative.    Objective: Vital Signs: There were no vitals taken for this visit.  Physical Exam Vitals and nursing note reviewed.  Constitutional:      Appearance: She is well-developed.  HENT:     Head: Normocephalic and atraumatic.  Pulmonary:     Effort: Pulmonary effort is normal.  Abdominal:     Palpations: Abdomen is soft.  Musculoskeletal:     Cervical back: Neck supple.  Skin:    General: Skin is warm.     Capillary Refill:  Capillary refill takes less than 2 seconds.  Neurological:     Mental Status: She is alert and oriented to person, place, and time.  Psychiatric:        Behavior: Behavior normal.        Thought Content: Thought content normal.        Judgment: Judgment normal.     Ortho Exam Examination of the left knee is unchanged from prior visit. Specialty Comments:  No specialty comments available.  Imaging: No results found.   PMFS History: Patient Active Problem List   Diagnosis Date Noted   BMI 35.0-35.9,adult 03/28/2023   Osteoarthritis, knee 11/28/2022   Bilateral knee pain 11/23/2022   Hepatic steatosis 11/07/2022   Other fatigue 10/24/2022   GAD (generalized anxiety disorder) 10/24/2022   Pre-diabetes 10/24/2022   Other hyperlipidemia 10/24/2022   Depression screen 10/24/2022   Hemorrhoids 06/07/2022   Abdominal pain, left upper quadrant 02/01/2022   Rib pain on left side 06/13/2020   Colon cancer screening 05/06/2020   Insomnia, psychophysiological 03/14/2020   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   Grief reaction 10/03/2018   Generalized anxiety disorder 01/23/2017   Vitamin D deficiency 05/20/2015   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   Morbid obesity (HCC) with starting BMI 37 10/25/2011  Routine general medical examination at a health care facility 10/16/2011   Hyperlipidemia LDL goal <130 12/04/2007   GERD 12/04/2007   Essential hypertension 12/04/2007   Past Medical History:  Diagnosis Date   Allergy    pollen   Anxiety    Bursitis, trochanteric    bi/lat    Dental crowns present    x 4   Diverticulosis    Fatty liver    GERD (gastroesophageal reflux disease)    Hyperlipidemia    no current med.   Hypertension    white coat syndrome,   Mass of breast, left 01/2012   Obesity    Palpitations    PONV (postoperative nausea and vomiting)    Pre-diabetes    Pre-diabetes    Vitamin D deficiency     Family  History  Problem Relation Age of Onset   Hypertension Mother    Hyperlipidemia Mother    Diabetes Mother    Breast cancer Mother 44   Uterine cancer Mother    GER disease Father    Depression Sister    Heart disease Brother    Heart attack Maternal Grandmother    Hypertension Maternal Grandmother    Colon cancer Neg Hx    Colon polyps Neg Hx    Esophageal cancer Neg Hx    Rectal cancer Neg Hx    Stomach cancer Neg Hx     Past Surgical History:  Procedure Laterality Date   ABDOMINAL HYSTERECTOMY     partial   ANTERIOR AND POSTERIOR REPAIR WITH SACROSPINOUS FIXATION N/A 08/20/2022   Procedure: POSTERIOR REPAIR WITH PERINEORRHAPHY AND WITH  SACROSPINOUS FIXATION;  Surgeon: Marguerita Beards, MD;  Location: [facility] [place];  Service: Gynecology;  Laterality: N/A;   BREAST BIOPSY Right 08/04/2021   BREAST EXCISIONAL BIOPSY Left 2013   BREAST SURGERY  01/10/2012   lumpectomy - left (excisional biopsy benign)   CHOLECYSTECTOMY N/A 02/10/2016   Procedure: LAPAROSCOPIC CHOLECYSTECTOMY WITH INTRAOPERATIVE CHOLANGIOGRAM;  Surgeon: Chevis Pretty III, MD;  Location: [facility];  Service: General;  Laterality: N/A;   COLONOSCOPY  04/06/2010   D Brodie, normal w/tics   RECTOCELE REPAIR  06/14/2011   cystocele repair, pt states Dr Jacquelyne Balint did not do rectocele and said she had to come back later.   ROBOTIC ASSISTED LAPAROSCOPIC SACROCOLPOPEXY  06/14/2011   Procedure: ROBOTIC ASSISTED LAPAROSCOPIC SACROCOLPOPEXY;  Surgeon: Crecencio Mc, MD;  Location: [facility];  Service: Urology;  Laterality: N/A;   TUBAL LIGATION     Social History   Occupational History   Occupation: RN-retired  Tobacco Use   Smoking status: Never    Passive exposure: Past   Smokeless tobacco: Never  Vaping Use   Vaping status: Never Used  Substance and Sexual Activity   Alcohol use: No    Alcohol/week: 0.0 standard drinks of alcohol   Drug use: No   Sexual activity: Not Currently    Birth control/protection:  Post-menopausal    Comment: has used estradiol patch 0.025  in the past, has stopped

## 2023-08-26 ENCOUNTER — Other Ambulatory Visit: Payer: Self-pay | Admitting: Family Medicine

## 2023-08-28 NOTE — Progress Notes (Unsigned)
SUBJECTIVE: Discussed the use of AI scribe software for clinical note transcription with the patient, who gave verbal consent to proceed.  Chief Complaint: Obesity  Interim History: She is up 2 lbs since her last visit.   Down 14 lbs overall TBW loss of 6.4% Makayla [name] is a 63 year old female with a history of obesity, prediabetes, hypertension, hyperlipidemia, and vitamin D deficiency, presents for a follow-up visit regarding her obesity treatment plan. She is currently on metformin, cholecalciferol, sertraline, and Norvasc.  The patient is contemplating a knee replacement due to persistent knee pain, which has been progressively worsening. The pain is constant and has been exacerbated by physical activity, such as housework. The patient reports that the pain has become so severe that it affects her daily activities and quality of life. She expresses concern about the rapid progression of the knee pain and is uncertain about the timing of the knee replacement surgery.  The patient also reports struggling with her diet, often skipping meals or eating smaller portions of her regular diet. She expresses concern about her cholesterol levels and the potential development of fatty liver disease. She also reports occasional nausea and heartburn, which she attributes to her metformin medication.  The patient is currently managing her constipation with a daily stool softener and occasional use of Miralax. She expresses a desire to lose weight before undergoing potential knee replacement surgery. Despite her health challenges, the patient remains determined to manage her conditions and improve her overall health.  Makayla [name] is here to discuss her progress with her obesity treatment plan. She is on the Category 2 Plan and states she is following her eating plan approximately 50 % of the time. She states she is not exercising 0 minutes 0 times per week due to severe left knee pain.   OBJECTIVE: Visit  Diagnoses: Problem List Items Addressed This Visit     Hyperlipidemia LDL goal <130 - Primary   Relevant Medications   Semaglutide-Weight Management (WEGOVY) 0.25 MG/0.5ML SOAJ   Vitamin D deficiency   Essential hypertension   Pre-diabetes   Osteoarthritis, knee   BMI 35.0-35.9,adult   Morbid obesity (HCC) with starting BMI 37 (Chronic)   Relevant Medications   Semaglutide-Weight Management (WEGOVY) 0.25 MG/0.5ML SOAJ   Other Visit Diagnoses       Heartburn          Obesity Challenges in weight management due to knee pain with decreased movement and exercise tolerance.  Discussed benefits of weight loss on overall health and knee pain. Discussed YQMVHQ for weight management and hyperlipidemia, including potential side effects (nausea, reflux, constipation, diarrhea) and need for monthly monitoring. No history of thyroid cancer, pancreatitis, or gallbladder issues. [facility] may improve glycemic index and lipid levels. - Submit prior authorization for [facility] - Educate on potential side effects of Wegovy - Monitor monthly for complications and effectiveness. Will need to monitor hunger/appetite closely as tends to under eat after starting [facility]    Hyperlipidemia Concerns about starting statins due to family history of diabetes. Discussed benefits of Wegovy for hyperlipidemia management. Reginal Lutes has FDA approval for hyperlipidemia and may help manage lipid levels.  We have reviewed the risks and benefits of using a GLP-1. The patient denies a personal or family history of medullary thyroid cancer or MENII. The patient denies a history of pancreatitis. The potential risks and benefits of this GLP-1 were reviewed with the patient, and alternative treatment options were discussed. All questions were answered, and the patient wishes to move forward  with this medication. - Submit prior authorization for [facility] and begin at 0.25 mg weekly.  - Monitor lipid levels regularly Continue to work on  nutrition plan -decreasing simple carbohydrates, increasing lean proteins, decreasing saturated fats and cholesterol , avoiding trans fats and exercise as able to promote weight loss, improve lipids and decrease cardiovascular risks.  Severe tricompartmental degenerative knee changes Experiencing constant knee pain affecting daily activities. Knee replacement surgery recommended by Dr. Roda Shutters.  Patient concerned about timing due to family support needs. - Consider timing for knee replacement surgery - Discuss benefits of surgery on functionality and pain relief - Discuss post-operative care and support options with family Continue to work on weight loss efforts to off load knee joint.  Will follow with Dr. Roda Shutters and team.   Prediabetes Current A1c of 6.1. On metformin 500 mg twice daily but experiencing heartburn and nausea. Discussed reducing metformin to once daily to alleviate symptoms but patient also agreeable to continue at BID dosing and use Pepcid complete when having heartburn prn.  Reginal Lutes may improve glycemic control, potentially allowing further adjustment of metformin dosage. - Reduce metformin to once daily if continues to have heartburn/GERD symptoms on regular basis.  - Monitor blood glucose levels and A1c - Consider adjusting metformin dosage if Reginal Lutes is initiated  Gastroesophageal reflux disease (GERD) Experiencing heartburn and nausea, likely exacerbated by metformin. Currently on Nexium BID. Discussed reducing metformin to once daily and using Pepcid Complete episodically for acute symptoms. Separating Nexium from other medications by 30 minutes may improve absorption. - Reduce metformin to once daily if persistent symptoms and monitor.  - Consider episodic use of Pepcid Complete for acute symptoms - Monitor for improvement in GERD symptoms. May need further evaluation if continues over the next 1-2 months.   Hypertension Well-managed with Norvasc 2.5 mg daily. - Continue  Norvasc 2.5 mg daily - Monitor blood pressure regularly Continue to work on nutrition plan to promote weight loss and improve BP control.   Vitamin D deficiency Managed with cholecalciferol 5000 units daily. - Continue cholecalciferol 5000 units daily - Monitor vitamin D levels periodically Low vitamin D levels can be associated with adiposity and may result in leptin resistance and weight gain. Also associated with fatigue. Currently on vitamin D supplementation without any adverse effects.    General Health Maintenance None - Provide recipes for healthy comfort foods - Encourage regular physical activity within pain limits - Monitor overall health and chronic conditions regularly  Follow-up - Follow up in one month to monitor response to [facility] and other treatments - Monitor blood glucose, lipid levels, and blood pressure regularly - Schedule follow-up appointments as needed for knee pain and potential surgery.  Vitals Temp: 98.2 F (36.8 C) BP: 116/75 Pulse Rate: 88 SpO2: 98 %   Anthropometric Measurements Height: 5\' 4"  (1.626 m) Weight: 206 lb (93.4 kg) BMI (Calculated): 35.34 Weight at Last Visit: 204 lb Weight Lost Since Last Visit: 0 Weight Gained Since Last Visit: 2 lb Starting Weight: 220 lb Total Weight Loss (lbs): 14 lb (6.35 kg)   Body Composition  Body Fat %: 45.3 % Fat Mass (lbs): 93.4 lbs Muscle Mass (lbs): 107.2 lbs Total Body Water (lbs): 74 lbs Visceral Fat Rating : 13   Other Clinical Data Fasting: no Labs: no Today's Visit #: 11 Starting Date: 10/24/22     ASSESSMENT AND PLAN:  Diet: Makayla [name] is currently in the action stage of change. As such, her goal is to continue with weight loss efforts.  She has agreed to Category 2 Plan.  Exercise: Makayla [name] has been instructed that some exercise is better than none and try chair exercises  for weight loss and overall health benefits.   Behavior Modification:  We discussed the following  Behavioral Modification Strategies today: increasing lean protein intake, decreasing simple carbohydrates, increasing vegetables, increase H2O intake, increase high fiber foods, meal planning and cooking strategies, avoiding temptations, and planning for success. We discussed various medication options to help Makayla [name] with her weight loss efforts and we both agreed to start [facility] for primary indication of hyperlipidemia for CV risk reduction and continue to work on nutritional and behavioral strategies to promote weight loss.  .  Return in about 4 weeks (around 09/26/2023).Marland Kitchen She was informed of the importance of frequent follow up visits to maximize her success with intensive lifestyle modifications for her multiple health conditions.  Attestation Statements:   Reviewed by clinician on day of visit: allergies, medications, problem list, medical history, surgical history, family history, social history, and previous encounter notes.   Time spent on visit including pre-visit chart review and post-visit care and charting was 42 minutes.    Karna Abed, PA-C

## 2023-08-29 ENCOUNTER — Ambulatory Visit (INDEPENDENT_AMBULATORY_CARE_PROVIDER_SITE_OTHER): Payer: Managed Care, Other (non HMO) | Admitting: Physician Assistant

## 2023-08-29 ENCOUNTER — Encounter (INDEPENDENT_AMBULATORY_CARE_PROVIDER_SITE_OTHER): Payer: Self-pay | Admitting: Physician Assistant

## 2023-08-29 ENCOUNTER — Telehealth: Payer: Self-pay | Admitting: *Deleted

## 2023-08-29 VITALS — BP 116/75 | HR 88 | Temp 98.2°F | Ht 64.0 in | Wt 206.0 lb

## 2023-08-29 DIAGNOSIS — R12 Heartburn: Secondary | ICD-10-CM

## 2023-08-29 DIAGNOSIS — M1712 Unilateral primary osteoarthritis, left knee: Secondary | ICD-10-CM | POA: Diagnosis not present

## 2023-08-29 DIAGNOSIS — I10 Essential (primary) hypertension: Secondary | ICD-10-CM | POA: Diagnosis not present

## 2023-08-29 DIAGNOSIS — R7303 Prediabetes: Secondary | ICD-10-CM

## 2023-08-29 DIAGNOSIS — Z6835 Body mass index (BMI) 35.0-35.9, adult: Secondary | ICD-10-CM

## 2023-08-29 DIAGNOSIS — E785 Hyperlipidemia, unspecified: Secondary | ICD-10-CM

## 2023-08-29 DIAGNOSIS — E559 Vitamin D deficiency, unspecified: Secondary | ICD-10-CM | POA: Diagnosis not present

## 2023-08-29 MED ORDER — WEGOVY 0.25 MG/0.5ML ~~LOC~~ SOAJ: 0.2500 mg | SUBCUTANEOUS | 0 refills | Status: DC

## 2023-08-29 NOTE — Telephone Encounter (Signed)
We received surgical clearance forms from [facility], per Dr. Milinda Antis pt needs surgical clearance appt., please schedule when able

## 2023-08-30 NOTE — Telephone Encounter (Signed)
Patient has been scheduled

## 2023-09-02 ENCOUNTER — Ambulatory Visit: Payer: Managed Care, Other (non HMO) | Admitting: Family Medicine

## 2023-09-02 ENCOUNTER — Encounter: Payer: Self-pay | Admitting: Family Medicine

## 2023-09-02 VITALS — BP 128/84 | HR 80 | Temp 97.5°F | Ht 64.0 in | Wt 213.0 lb

## 2023-09-02 DIAGNOSIS — I10 Essential (primary) hypertension: Secondary | ICD-10-CM | POA: Diagnosis not present

## 2023-09-02 DIAGNOSIS — E785 Hyperlipidemia, unspecified: Secondary | ICD-10-CM

## 2023-09-02 DIAGNOSIS — E66812 Obesity, class 2: Secondary | ICD-10-CM

## 2023-09-02 DIAGNOSIS — Z01818 Encounter for other preprocedural examination: Secondary | ICD-10-CM

## 2023-09-02 DIAGNOSIS — M170 Bilateral primary osteoarthritis of knee: Secondary | ICD-10-CM | POA: Diagnosis not present

## 2023-09-02 DIAGNOSIS — R7303 Prediabetes: Secondary | ICD-10-CM

## 2023-09-02 DIAGNOSIS — Z6836 Body mass index (BMI) 36.0-36.9, adult: Secondary | ICD-10-CM

## 2023-09-02 MED ORDER — IBUPROFEN 800 MG PO TABS: 800.0000 mg | ORAL_TABLET | Freq: Every day | ORAL | 2 refills | Status: DC | PRN

## 2023-09-02 NOTE — Assessment & Plan Note (Signed)
Lab Results  Component Value Date   HGBA1C 6.1 06/21/2023   HGBA1C 5.9 (H) 02/28/2023   HGBA1C 6.0 (H) 10/24/2022   disc imp of low glycemic diet and wt loss to prevent DM2

## 2023-09-02 NOTE — Assessment & Plan Note (Signed)
bp in fair control at this time  BP Readings from Last 1 Encounters:  09/02/23 128/84   No changes needed Most recent labs reviewed  Disc lifstyle change with low sodium diet and exercise  Plan to continue amlodipine 2.5 mg daily

## 2023-09-02 NOTE — Assessment & Plan Note (Signed)
Obese pt (bmi 36.5) with HTN , prediabetes , hyperlipidemia is getting ready for a left total knee replacement with spinal anesthesia from Dr Roda Shutters No date set yet  Chronic conditions are controlled  Pt does also see cardiology for HTN, history of palpitations and hyperlipidemia) she will need clearance from them  Reported ETT was normal in 2023   Exercise tolerance is good from stamina perspective (can exercise over 30 minutes without stopping to rest)  No significant drug allergies  Will need to hold nsaid at least a week pre osteoporosis If she starts glp-1 for weight loss will also need to hold that for at least a week   Medically clear Req cardiology clearance

## 2023-09-02 NOTE — Assessment & Plan Note (Signed)
Continues visits/treatment with the [facility]  Considering wegovy if it is covered If she starts this  , will need to hold at least a wk before knee surgery

## 2023-09-02 NOTE — Patient Instructions (Addendum)
Hold your ibuprofen for at least a week before surgery   If you do get started on wegovy - hold that for a week before surgery    Call ortho to send [facility] a request for surgical clearance    Take care of yourself

## 2023-09-02 NOTE — Assessment & Plan Note (Signed)
Disc goals for lipids and reasons to control them Rev last labs with pt Rev low sat fat diet in detail  Pt sees the [facility] weight center and also cardiology regarding this  Last LDL 147  Wants to avoid statin if possible  No known vasc dz or symptoms currently

## 2023-09-02 NOTE — Assessment & Plan Note (Signed)
Left knee is worse  Planning a knee replacement  Refilled ibuprofen 800 mg to take daily prn with food  Aware will have to hold a least 7 d before surgery

## 2023-09-02 NOTE — Progress Notes (Signed)
Subjective:    Patient ID: [name], female    DOB: 10/29/1960, 63 y.o.   MRN: [id number]  HPI  Wt Readings from Last 3 Encounters:  09/02/23 213 lb (96.6 kg)  08/29/23 206 lb (93.4 kg)  07/02/23 208 lb (94.3 kg)   36.56 kg/m  Vitals:   09/02/23 1151  BP: 128/84  Pulse: 80  Temp: (!) 97.5 F (36.4 C)  SpO2: 98%   Pt presents for visit for operative clearance for left total knee replacement and chronic conditions   Surgery date not made yet  Dr Roda Shutters Plans spinal anesthesia    No history of vascular dz No history of blood clots  Last cardiology visit was at [facility] in march 2024 ETT 03/2022 -has appointment in march  Seen for HTN and hyperlipidemia  EKG noted NSR with incomplete RBBB in past, low volt in precordiaal leads and non specific T wave abn  This is her baseline   Nsaids- tylenol and ibuprofen -alternates  Needs refill of ibuprofen 800 for prn use  Does not take any asa    Has good exercise tolerance  If it were not for knees- can exercise for 30 min or more  No shortness of breath  No cp    No drug allergies   Is trying to get wegovy for weight from healthy weight clinic   HTN bp is stable today  No cp or palpitations or headaches or edema  No side effects to medicines  BP Readings from Last 3 Encounters:  09/02/23 128/84  08/29/23 116/75  06/24/23 108/74    Amlodipine 2.5 mg daily   Lab Results  Component Value Date   NA 140 06/21/2023   K 4.2 06/21/2023   CO2 30 06/21/2023   GLUCOSE 110 (H) 06/21/2023   BUN 11 06/21/2023   CREATININE 0.94 06/21/2023   CALCIUM 9.7 06/21/2023   GFR 64.70 06/21/2023   EGFR 80 10/24/2022   GFRNONAA >60 01/30/2022   Lab Results  Component Value Date   ALT 11 06/21/2023   AST 11 06/21/2023   ALKPHOS 103 06/21/2023   BILITOT 0.3 06/21/2023     Lipids Lab Results  Component Value Date   CHOL 231 (H) 06/21/2023   HDL 55.10 06/21/2023   LDLCALC 147 (H) 06/21/2023   TRIG 144.0 06/21/2023    CHOLHDL 4 06/21/2023   Prediabetes Lab Results  Component Value Date   HGBA1C 6.1 06/21/2023   HGBA1C 5.9 (H) 02/28/2023   HGBA1C 6.0 (H) 10/24/2022      Patient Active Problem List   Diagnosis Date Noted   Pre-operative clearance 09/02/2023   Obesity due to excess calories 03/28/2023   Osteoarthritis, knee 11/28/2022   Bilateral knee pain 11/23/2022   Hepatic steatosis 11/07/2022   GAD (generalized anxiety disorder) 10/24/2022   Pre-diabetes 10/24/2022   Other hyperlipidemia 10/24/2022   Depression screen 10/24/2022   Hemorrhoids 06/07/2022   Abdominal pain, left upper quadrant 02/01/2022   Rib pain on left side 06/13/2020   Colon cancer screening 05/06/2020   Insomnia, psychophysiological 03/14/2020   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   Grief reaction 10/03/2018   Generalized anxiety disorder 01/23/2017   Vitamin D deficiency 05/20/2015   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   Morbid obesity (HCC) with starting BMI 37 10/25/2011   Routine general medical examination at a health care facility 10/16/2011   Hyperlipidemia LDL goal <130 12/04/2007   GERD 12/04/2007   Essential hypertension 12/04/2007  Past Medical History:  Diagnosis Date   Allergy    pollen   Anxiety    Bursitis, trochanteric    bi/lat    Dental crowns present    x 4   Diverticulosis    Fatty liver    GERD (gastroesophageal reflux disease)    Hyperlipidemia    no current med.   Hypertension    white coat syndrome,   Mass of breast, left 01/2012   Obesity    Palpitations    PONV (postoperative nausea and vomiting)    Pre-diabetes    Pre-diabetes    Vitamin D deficiency    Past Surgical History:  Procedure Laterality Date   ABDOMINAL HYSTERECTOMY     partial   ANTERIOR AND POSTERIOR REPAIR WITH SACROSPINOUS FIXATION N/A 08/20/2022   Procedure: POSTERIOR REPAIR WITH PERINEORRHAPHY AND WITH  SACROSPINOUS FIXATION;  Surgeon: Marguerita Beards,  MD;  Location: [facility];  Service: Gynecology;  Laterality: N/A;   BREAST BIOPSY Right 08/04/2021   BREAST EXCISIONAL BIOPSY Left 2013   BREAST SURGERY  01/10/2012   lumpectomy - left (excisional biopsy benign)   CHOLECYSTECTOMY N/A 02/10/2016   Procedure: LAPAROSCOPIC CHOLECYSTECTOMY WITH INTRAOPERATIVE CHOLANGIOGRAM;  Surgeon: Chevis Pretty III, MD;  Location: [facility];  Service: General;  Laterality: N/A;   COLONOSCOPY  04/06/2010   D Brodie, normal w/tics   RECTOCELE REPAIR  06/14/2011   cystocele repair, pt states Dr Jacquelyne Balint did not do rectocele and said she had to come back later.   ROBOTIC ASSISTED LAPAROSCOPIC SACROCOLPOPEXY  06/14/2011   Procedure: ROBOTIC ASSISTED LAPAROSCOPIC SACROCOLPOPEXY;  Surgeon: Crecencio Mc, MD;  Location: [facility];  Service: Urology;  Laterality: N/A;   TUBAL LIGATION     Social History   Tobacco Use   Smoking status: Never    Passive exposure: Past   Smokeless tobacco: Never  Vaping Use   Vaping status: Never Used  Substance Use Topics   Alcohol use: No    Alcohol/week: 0.0 standard drinks of alcohol   Drug use: No   Family History  Problem Relation Age of Onset   Hypertension Mother    Hyperlipidemia Mother    Diabetes Mother    Breast cancer Mother 5   Uterine cancer Mother    GER disease Father    Depression Sister    Heart disease Brother    Heart attack Maternal Grandmother    Hypertension Maternal Grandmother    Colon cancer Neg Hx    Colon polyps Neg Hx    Esophageal cancer Neg Hx    Rectal cancer Neg Hx    Stomach cancer Neg Hx    Allergies  Allergen Reactions   Buspar [Buspirone]     Made her more anxious   Current Outpatient Medications on File Prior to Visit  Medication Sig Dispense Refill   acetaminophen (TYLENOL) 500 MG tablet Take 1 tablet (500 mg total) by mouth every 6 (six) hours as needed (pain). 30 tablet 0   amLODipine (NORVASC) 2.5 MG tablet Take 1 tablet (2.5 mg total) by mouth daily. 90 tablet  3   b complex vitamins capsule Take 1 capsule by mouth daily.     esomeprazole (NEXIUM) 20 MG capsule Take 20 mg by mouth 2 (two) times daily before a meal.     estradiol (ESTRACE) 0.1 MG/GM vaginal cream Place 0.5 g vaginally 2 (two) times a week. Place 0.5g nightly for two weeks then twice a week after 30 g 11  loratadine (CLARITIN) 10 MG tablet Take 10 mg by mouth daily as needed for allergies.     metFORMIN (GLUCOPHAGE) 500 MG tablet Take 1 tablet (500 mg total) by mouth 2 (two) times daily with a meal. 180 tablet 0   sertraline (ZOLOFT) 25 MG tablet Take 1 tablet (25 mg total) by mouth daily. 90 tablet 3   Vitamin D, Cholecalciferol, 25 MCG (1000 UT) TABS Take 5,000 Units by mouth daily.     famotidine (PEPCID) 20 MG tablet Take 1 tablet (20 mg total) by mouth 2 (two) times daily. (Patient not taking: Reported on 09/02/2023) 60 tablet 1   fluticasone (FLONASE) 50 MCG/ACT nasal spray Place 2 sprays into both nostrils daily. (Patient not taking: Reported on 09/02/2023) 48 g 3   Semaglutide-Weight Management (WEGOVY) 0.25 MG/0.5ML SOAJ Inject 0.25 mg into the skin once a week. (Patient not taking: Reported on 09/02/2023) 2 mL 0   No current facility-administered medications on file prior to visit.    Review of Systems  Constitutional:  Negative for activity change, appetite change, fatigue, fever and unexpected weight change.  HENT:  Negative for congestion, ear pain, rhinorrhea, sinus pressure and sore throat.   Eyes:  Negative for pain, redness and visual disturbance.  Respiratory:  Negative for cough, shortness of breath and wheezing.   Cardiovascular:  Negative for chest pain and palpitations.  Gastrointestinal:  Negative for abdominal pain, blood in stool, constipation and diarrhea.  Endocrine: Negative for polydipsia and polyuria.  Genitourinary:  Negative for dysuria, frequency and urgency.  Musculoskeletal:  Positive for arthralgias. Negative for back pain and myalgias.  Skin:   Negative for pallor and rash.  Allergic/Immunologic: Negative for environmental allergies.  Neurological:  Negative for dizziness, syncope and headaches.  Hematological:  Negative for adenopathy. Does not bruise/bleed easily.  Psychiatric/Behavioral:  Negative for decreased concentration and dysphoric mood. The patient is nervous/anxious.        Objective:   Physical Exam Constitutional:      General: She is not in acute distress.    Appearance: Normal appearance. She is well-developed. She is obese. She is not ill-appearing or diaphoretic.  HENT:     Head: Normocephalic and atraumatic.  Eyes:     Conjunctiva/sclera: Conjunctivae normal.     Pupils: Pupils are equal, round, and reactive to light.  Neck:     Thyroid: No thyromegaly.     Vascular: No carotid bruit or JVD.  Cardiovascular:     Rate and Rhythm: Normal rate and regular rhythm.     Heart sounds: Normal heart sounds.     No gallop.  Pulmonary:     Effort: Pulmonary effort is normal. No respiratory distress.     Breath sounds: Normal breath sounds. No wheezing or rales.  Abdominal:     General: There is no distension or abdominal bruit.     Palpations: Abdomen is soft.  Musculoskeletal:     Cervical back: Normal range of motion and neck supple.     Right lower leg: No edema.     Left lower leg: No edema.     Comments: Limited rom left knee  Lymphadenopathy:     Cervical: No cervical adenopathy.  Skin:    General: Skin is warm and dry.     Coloration: Skin is not pale.     Findings: No rash.  Neurological:     Mental Status: She is alert.     Coordination: Coordination normal.     Deep Tendon Reflexes:  Reflexes are normal and symmetric. Reflexes normal.  Psychiatric:        Mood and Affect: Mood normal.           Assessment & Plan:   Problem List Items Addressed This Visit       Cardiovascular and Mediastinum   Essential hypertension   bp in fair control at this time  BP Readings from Last 1  Encounters:  09/02/23 128/84   No changes needed Most recent labs reviewed  Disc lifstyle change with low sodium diet and exercise  Plan to continue amlodipine 2.5 mg daily         Musculoskeletal and Integument   Osteoarthritis, knee   Relevant Medications   ibuprofen (ADVIL) 800 MG tablet     Other   Pre-operative clearance - Primary   Obese pt (bmi 36.5) with HTN , prediabetes , hyperlipidemia is getting ready for a left total knee replacement with spinal anesthesia from Dr Roda Shutters No date set yet  Chronic conditions are controlled  Pt does also see cardiology for HTN, history of palpitations and hyperlipidemia) she will need clearance from them  Reported ETT was normal in 2023   Exercise tolerance is good from stamina perspective (can exercise over 30 minutes without stopping to rest)  No significant drug allergies  Will need to hold nsaid at least a week pre osteoporosis If she starts glp-1 for weight loss will also need to hold that for at least a week   Medically clear Req cardiology clearance         Pre-diabetes   Lab Results  Component Value Date   HGBA1C 6.1 06/21/2023   HGBA1C 5.9 (H) 02/28/2023   HGBA1C 6.0 (H) 10/24/2022   disc imp of low glycemic diet and wt loss to prevent DM2       Obesity due to excess calories   Continues visits/treatment with the healthy weight clinic  Considering wegovy if it is covered If she starts this  , will need to hold at least a wk before knee surgery      Hyperlipidemia LDL goal <130   Disc goals for lipids and reasons to control them Rev last labs with pt Rev low sat fat diet in detail  Pt sees the healthy weight center and also cardiology regarding this  Last LDL 147  Wants to avoid statin if possible  No known vasc dz or symptoms currently

## 2023-09-05 ENCOUNTER — Encounter (INDEPENDENT_AMBULATORY_CARE_PROVIDER_SITE_OTHER): Payer: Self-pay | Admitting: Physician Assistant

## 2023-09-09 ENCOUNTER — Telehealth (INDEPENDENT_AMBULATORY_CARE_PROVIDER_SITE_OTHER): Payer: Self-pay | Admitting: *Deleted

## 2023-09-09 NOTE — Telephone Encounter (Signed)
Information regarding your request Your patient will pay 100% of a discounted price for this medication. Any amount the patient pays will not apply to their deductible or out-of-pocket expenses  Patient has been updated thru Mychart.

## 2023-09-17 ENCOUNTER — Other Ambulatory Visit: Payer: Self-pay

## 2023-09-17 DIAGNOSIS — M1712 Unilateral primary osteoarthritis, left knee: Secondary | ICD-10-CM

## 2023-09-30 ENCOUNTER — Ambulatory Visit (INDEPENDENT_AMBULATORY_CARE_PROVIDER_SITE_OTHER): Payer: Managed Care, Other (non HMO) | Admitting: Physician Assistant

## 2023-09-30 ENCOUNTER — Encounter (INDEPENDENT_AMBULATORY_CARE_PROVIDER_SITE_OTHER): Payer: Self-pay | Admitting: Physician Assistant

## 2023-09-30 ENCOUNTER — Telehealth (INDEPENDENT_AMBULATORY_CARE_PROVIDER_SITE_OTHER): Payer: Self-pay | Admitting: *Deleted

## 2023-09-30 VITALS — BP 124/76 | HR 89 | Temp 98.2°F | Ht 64.0 in | Wt 210.0 lb

## 2023-09-30 DIAGNOSIS — I10 Essential (primary) hypertension: Secondary | ICD-10-CM

## 2023-09-30 DIAGNOSIS — Z6836 Body mass index (BMI) 36.0-36.9, adult: Secondary | ICD-10-CM

## 2023-09-30 DIAGNOSIS — E785 Hyperlipidemia, unspecified: Secondary | ICD-10-CM

## 2023-09-30 DIAGNOSIS — E559 Vitamin D deficiency, unspecified: Secondary | ICD-10-CM

## 2023-09-30 DIAGNOSIS — R7303 Prediabetes: Secondary | ICD-10-CM

## 2023-09-30 DIAGNOSIS — M170 Bilateral primary osteoarthritis of knee: Secondary | ICD-10-CM

## 2023-09-30 DIAGNOSIS — E669 Obesity, unspecified: Secondary | ICD-10-CM

## 2023-09-30 MED ORDER — TIRZEPATIDE 2.5 MG/0.5ML ~~LOC~~ SOAJ: 2.5000 mg | SUBCUTANEOUS | 0 refills | Status: DC

## 2023-09-30 MED ORDER — METFORMIN HCL 500 MG PO TABS
500.0000 mg | ORAL_TABLET | Freq: Two times a day (BID) | ORAL | 0 refills | Status: DC
Start: 2023-09-30 — End: 2023-11-07

## 2023-09-30 NOTE — Telephone Encounter (Signed)
 Medication (Moumjaro-2.5 Mg/0.5 Ml pen)request has been sent to patient's plan for prior authorization thru coverMymeds, waiting for their response.

## 2023-09-30 NOTE — Progress Notes (Signed)
 SUBJECTIVE: Discussed the use of AI scribe software for clinical note transcription with the patient, who gave verbal consent to proceed.   Chief Complaint: Obesity  Interim History: She is up 4 lb since last visit.  Bio impedence scale reviewed with the patient:  Muscle mass + 1.2 lbs Adipose mass + 2.6 lbs Total body water 1.4 lbs.   Makayla [name] is here to discuss her progress with her obesity treatment plan. She is on the Category 2 Plan and states she is following her eating plan approximately 50 % of the time. She states she is exercising walking/yard work 10 minutes 3-4 times per week.  Makayla [name] is a 63 year old female with obesity who presents for follow-up of her obesity treatment plan.  She has been unable to obtain Wegovy due to lack of insurance coverage and has explored other medication options such as Ozempic and Mounjaro and may have insurance coverage for these medications.   She has osteoarthritis in both knees with increased discomfort, impacting her mobility and ability to engage in physical activities. She received a steroid injection on December 31st, which has delayed her surgery plans. She is considering scheduling her surgery for late May or early June to accommodate her daughter's availability to assist her post-operatively.  She has a history of prediabetes and is currently taking metformin 500 mg twice daily. Her last A1c was worsening despite metformin and good adherence to her nutrition plan and exercise as able.   She has hyperlipidemia and is concerned about her LDL levels. She is apprehensive about starting a statin and is exploring the potential impact of weight loss medications on her lipid profile.  She has hypertension and is on Norvasc 2.5 mg daily. No changes were discussed in this visit.  She reports difficulty with weight management and has been trying to incorporate physical activity despite her knee pain. She has noticed changes in her taste  preferences, particularly with red meat, and is exploring alternative protein sources. She is mindful of her dietary choices and is working on maintaining a balanced diet. OBJECTIVE: Visit Diagnoses: Problem List Items Addressed This Visit     Vitamin D deficiency   Essential hypertension   Pre-diabetes - Primary   Relevant Medications   tirzepatide (MOUNJARO) 2.5 MG/0.5ML Pen   metFORMIN (GLUCOPHAGE) 500 MG tablet   Osteoarthritis, knee   Relevant Medications   tirzepatide (MOUNJARO) 2.5 MG/0.5ML Pen   Other Visit Diagnoses       Generalized obesity- Start BMI 37.0       Relevant Medications   tirzepatide (MOUNJARO) 2.5 MG/0.5ML Pen   metFORMIN (GLUCOPHAGE) 500 MG tablet     BMI 36.0-36.9,adult Current BMI 36.0         Obesity 62 year old female with obesity, prediabetes, hypertension, hyperlipidemia, and vitamin D deficiency. Unable to obtain Wegovy due to lack of insurance coverage. Mounjaro preferred over Ozempic due to better tolerance and higher weight loss efficacy. Discussed side effects including nausea, vomiting, diarrhea, constipation, potential vision changes, mood changes, and pancreatitis. Emphasized hydration and protein intake. - Submit prior authorization for [facility] under prediabetes, knee osteoarthritis, and generalized obesity - reviewed injection using demo pen for [employer] on rotating injection sites and side effect management - Advise to avoid fatty foods to prevent nausea - Monitor for vision changes, mood changes, and signs of pancreatitis - Maintain metformin 500 mg twice daily, reduce to once daily if experiencing nausea - Encourage protein intake of 85+ grams/day and fluid  intake of 80-100 ounces/day - Schedule follow-up on October 28, 2023 at 8:30 AM  Osteoarthritis of both knees Reports increased knee discomfort. Currently taking ibuprofen 800 mg daily. Discussed the impact of weight on knee pain and potential benefits of weight loss with  Mounjaro. - Submit prior authorization for [facility] under knee osteoarthritis - Continue ibuprofen 800 mg daily  Prediabetes On metformin 500 mg twice daily. Last A1c increasing.  Discussed potential benefits of Mounjaro for prediabetes management, including improved insulin resistance and glycemic control. Lab Results  Component Value Date   HGBA1C 6.1 06/21/2023   HGBA1C 5.9 (H) 02/28/2023   HGBA1C 6.0 (H) 10/24/2022   Lab Results  Component Value Date   LDLCALC 147 (H) 06/21/2023   CREATININE 0.94 06/21/2023  - Submit prior authorization for Mounjaro under prediabetes - Continue/refill metformin 500 mg twice daily, reduce to once daily if experiencing nausea Continue working on nutrition plan to decrease simple carbohydrates, increase lean proteins and exercise to promote weight loss, improve glycemic control and prevent progression to Type 2 diabetes.   Hypertension On Norvasc 2.5 mg daily for blood pressure management.Renal function is normal.  - Continue Norvasc 2.5 mg daily Continue to work on nutrition plan to promote weight loss and improve BP control.   Hyperlipidemia Elevated LDL and triglycerides. Resistant to starting a statin. Discussed potential impact of Mounjaro on lipid levels. Last lipids Lab Results  Component Value Date   CHOL 231 (H) 06/21/2023   HDL 55.10 06/21/2023   LDLCALC 147 (H) 06/21/2023   TRIG 144.0 06/21/2023   CHOLHDL 4 06/21/2023   The 10-year ASCVD risk score (Arnett DK, et al., 2019) is: 6.2%   Values used to calculate the score:     Age: 66 years     Sex: Female     Is Non-Hispanic African American: No     Diabetic: No     Tobacco smoker: No     Systolic Blood Pressure: 124 mmHg     Is BP treated: Yes     HDL Cholesterol: 55.1 mg/dL     Total Cholesterol: 231 mg/dL Continue to work on nutrition plan -decreasing simple carbohydrates, increasing lean proteins, decreasing saturated fats and cholesterol , avoiding trans fats and  exercise as able to promote weight loss, improve lipids and decrease cardiovascular risks. - Submit prior authorization for [facility] under generalized obesity - Monitor lipid levels after starting Mounjaro  Vitamin D deficiency On vitamin D 5000 units daily. No side effects with OTC vitamin D.  Last vitamin D Lab Results  Component Value Date   VD25OH 70.4 10/24/2022  - Continue vitamin D 5000 units daily Low vitamin D levels can be associated with adiposity and may result in leptin resistance and weight gain. Also associated with fatigue.  Currently on vitamin D supplementation without any adverse effects such as nausea, vomiting or muscle weakness.    General Health Maintenance Discussed dietary modifications to support weight loss and overall health. Emphasized protein intake and hydration. Advised to avoid skipping meals and to eat small, protein-based meals every 4 hours. Encouraged physical activity as tolerated, considering knee pain. - Encourage a balanced diet with a focus on protein and nutrient-dense foods - Advise to avoid skipping meals and to eat small, protein-based meals every 4 hours - Encourage physical activity as tolerated, considering knee pain  Follow-up - Follow-up appointment on October 28, 2023 at 8:30 AM.  Vitals Temp: 98.2 F (36.8 C) BP: 124/76 Pulse Rate: 89 SpO2: 95 %  Anthropometric Measurements Height: 5\' 4"  (1.626 m) Weight: 210 lb (95.3 kg) BMI (Calculated): 36.03 Weight at Last Visit: 206 lb Weight Lost Since Last Visit: 0 Weight Gained Since Last Visit: 4 lb Starting Weight: 220 lb Total Weight Loss (lbs): 10 lb (4.536 kg)   Body Composition  Body Fat %: 45.6 % Fat Mass (lbs): 95.8 lbs Muscle Mass (lbs): 108.4 lbs Total Body Water (lbs): 75.4 lbs Visceral Fat Rating : 14   Other Clinical Data Fasting: no Labs: no Today's Visit #: 12 Starting Date: 10/24/22     ASSESSMENT AND PLAN:  Diet: Makayla [name] is currently in the action  stage of change. As such, her goal is to continue with weight loss efforts. She has agreed to Category 2 Plan.  Exercise: Makayla [name] has been instructed  continue exercising as able   for weight loss and overall health benefits.   Behavior Modification:  We discussed the following Behavioral Modification Strategies today: increasing lean protein intake, decreasing simple carbohydrates, increasing vegetables, increase H2O intake, increase high fiber foods, no skipping meals, avoiding temptations, and planning for success. We discussed various medication options to help Makayla [name] with her weight loss efforts and we both agreed to continue Metformin and start Mounjaro for prediabetes and continue to work on nutritional and behavioral strategies to promote weight loss.  .  Return in about 4 weeks (around 10/28/2023).Marland Kitchen She was informed of the importance of frequent follow up visits to maximize her success with intensive lifestyle modifications for her multiple health conditions.  Attestation Statements:   Reviewed by clinician on day of visit: allergies, medications, problem list, medical history, surgical history, family history, social history, and previous encounter notes.   Time spent on visit including pre-visit chart review and post-visit care and charting was 47 minutes.    Amillia Biffle, PA-C

## 2023-10-07 ENCOUNTER — Telehealth (INDEPENDENT_AMBULATORY_CARE_PROVIDER_SITE_OTHER): Payer: Self-pay | Admitting: *Deleted

## 2023-10-07 NOTE — Telephone Encounter (Signed)
 Patient request for Mounjaro 2.5 mg/0.5 Pen injctr, information submitted did not meet criteria necessary to approve this medication, not type 2 diabetes. Patient has been updated concerning this information.

## 2023-10-16 ENCOUNTER — Other Ambulatory Visit: Payer: Self-pay

## 2023-10-24 NOTE — Progress Notes (Deleted)
   SUBJECTIVE: Discussed the use of AI scribe software for clinical note transcription with the patient, who gave verbal consent to proceed.  Chief Complaint: Obesity  Interim History: ***  Makayla [name] is here to discuss her progress with her obesity treatment plan. She is on the {HWW Weight Loss Plan:210964005} and states she {CHL AMB IS/IS NOT:210130109} following her eating plan approximately *** % of the time. She states she {CHL AMB IS/IS NOT:210130109} exercising *** minutes *** times per week.   OBJECTIVE: Visit Diagnoses: Problem List Items Addressed This Visit   None   No data recorded No data recorded No data recorded No data recorded   ASSESSMENT AND PLAN:  Diet: Makayla [name] {CHL AMB IS/IS NOT:210130109} currently in the action stage of change. As such, her goal is to {HWW Weight Loss Efforts:210964006}. She {HAS HAS WGN:56213} agreed to {HWW Weight Loss Plan:210964005}.  Exercise: Makayla [name] has been instructed {HWW Exercise:210964007} for weight loss and overall health benefits.   Behavior Modification:  We discussed the following Behavioral Modification Strategies today: {HWW Behavior Modification:210964008}. We discussed various medication options to help Makayla [name] with her weight loss efforts and we both agreed to ***.  No follow-ups on file.Marland Kitchen She was informed of the importance of frequent follow up visits to maximize her success with intensive lifestyle modifications for her multiple health conditions.  Attestation Statements:   Reviewed by clinician on day of visit: allergies, medications, problem list, medical history, surgical history, family history, social history, and previous encounter notes.   Time spent on visit including pre-visit chart review and post-visit care and charting was *** minutes.    Makayla Croswell, PA-C

## 2023-10-28 ENCOUNTER — Ambulatory Visit (INDEPENDENT_AMBULATORY_CARE_PROVIDER_SITE_OTHER): Payer: Managed Care, Other (non HMO) | Admitting: Physician Assistant

## 2023-10-30 NOTE — Progress Notes (Deleted)
   SUBJECTIVE: Discussed the use of AI scribe software for clinical note transcription with the patient, who gave verbal consent to proceed.  Chief Complaint: Obesity  Interim History: ***  Makayla [name] is here to discuss her progress with her obesity treatment plan. She is on the {HWW Weight Loss Plan:210964005} and states she {CHL AMB IS/IS NOT:210130109} following her eating plan approximately *** % of the time. She states she {CHL AMB IS/IS NOT:210130109} exercising *** minutes *** times per week.   OBJECTIVE: Visit Diagnoses: Problem List Items Addressed This Visit     Vitamin D deficiency   Essential hypertension   Pre-diabetes - Primary   Osteoarthritis, knee   Other Visit Diagnoses       Generalized obesity- Start BMI 37.0           No data recorded No data recorded No data recorded No data recorded   ASSESSMENT AND PLAN:  Diet: Makayla [name] {CHL AMB IS/IS NOT:210130109} currently in the action stage of change. As such, her goal is to {HWW Weight Loss Efforts:210964006}. She {HAS HAS UEA:54098} agreed to {HWW Weight Loss Plan:210964005}.  Exercise: Makayla [name] has been instructed {HWW Exercise:210964007} for weight loss and overall health benefits.   Behavior Modification:  We discussed the following Behavioral Modification Strategies today: {HWW Behavior Modification:210964008}. We discussed various medication options to help Makayla [name] with her weight loss efforts and we both agreed to ***.  No follow-ups on file.Marland Kitchen She was informed of the importance of frequent follow up visits to maximize her success with intensive lifestyle modifications for her multiple health conditions.  Attestation Statements:   Reviewed by clinician on day of visit: allergies, medications, problem list, medical history, surgical history, family history, social history, and previous encounter notes.   Time spent on visit including pre-visit chart review and post-visit care and charting was *** minutes.     Richrd Kuzniar, PA-C

## 2023-10-31 ENCOUNTER — Ambulatory Visit (INDEPENDENT_AMBULATORY_CARE_PROVIDER_SITE_OTHER): Admitting: Physician Assistant

## 2023-11-04 ENCOUNTER — Ambulatory Visit: Payer: Managed Care, Other (non HMO) | Admitting: Dermatology

## 2023-11-04 DIAGNOSIS — I781 Nevus, non-neoplastic: Secondary | ICD-10-CM

## 2023-11-04 DIAGNOSIS — D225 Melanocytic nevi of trunk: Secondary | ICD-10-CM

## 2023-11-04 DIAGNOSIS — Z1283 Encounter for screening for malignant neoplasm of skin: Secondary | ICD-10-CM

## 2023-11-04 DIAGNOSIS — I8393 Asymptomatic varicose veins of bilateral lower extremities: Secondary | ICD-10-CM

## 2023-11-04 DIAGNOSIS — D2372 Other benign neoplasm of skin of left lower limb, including hip: Secondary | ICD-10-CM

## 2023-11-04 DIAGNOSIS — L570 Actinic keratosis: Secondary | ICD-10-CM

## 2023-11-04 DIAGNOSIS — W908XXA Exposure to other nonionizing radiation, initial encounter: Secondary | ICD-10-CM | POA: Diagnosis not present

## 2023-11-04 DIAGNOSIS — D239 Other benign neoplasm of skin, unspecified: Secondary | ICD-10-CM

## 2023-11-04 DIAGNOSIS — D229 Melanocytic nevi, unspecified: Secondary | ICD-10-CM

## 2023-11-04 DIAGNOSIS — L578 Other skin changes due to chronic exposure to nonionizing radiation: Secondary | ICD-10-CM | POA: Diagnosis not present

## 2023-11-04 DIAGNOSIS — L820 Inflamed seborrheic keratosis: Secondary | ICD-10-CM | POA: Diagnosis not present

## 2023-11-04 DIAGNOSIS — L814 Other melanin hyperpigmentation: Secondary | ICD-10-CM

## 2023-11-04 DIAGNOSIS — D235 Other benign neoplasm of skin of trunk: Secondary | ICD-10-CM

## 2023-11-04 DIAGNOSIS — D1801 Hemangioma of skin and subcutaneous tissue: Secondary | ICD-10-CM

## 2023-11-04 DIAGNOSIS — L821 Other seborrheic keratosis: Secondary | ICD-10-CM

## 2023-11-04 DIAGNOSIS — D692 Other nonthrombocytopenic purpura: Secondary | ICD-10-CM

## 2023-11-04 NOTE — Progress Notes (Signed)
 Follow-Up Visit   Subjective  Makayla [name] is a 63 y.o. female who presents for the following: Skin Cancer Screening and Full Body Skin Exam Spot at left flank irritated by pants and spot at left forehead that is itchy and scaly.  AK at L forehead previously frozen couple years ago.   The patient presents for Total-Body Skin Exam (TBSE) for skin cancer screening and mole check. The patient has spots, moles and lesions to be evaluated, some may be new or changing and the patient may have concern these could be cancer.    The following portions of the chart were reviewed this encounter and updated as appropriate: medications, allergies, medical history  Review of Systems:  No other skin or systemic complaints except as noted in HPI or Assessment and Plan.  Objective  Well appearing patient in no apparent distress; mood and affect are within normal limits.  A full examination was performed including scalp, head, eyes, ears, nose, lips, neck, chest, axillae, abdomen, back, buttocks, bilateral upper extremities, bilateral lower extremities, hands, feet, fingers, toes, fingernails, and toenails. All findings within normal limits unless otherwise noted below.   Relevant physical exam findings are noted in the Assessment and Plan.  Left Forehead x 1 Pink scaly macule left lower flank x 1 Erythematous stuck-on, waxy papule or plaque  Assessment & Plan   SKIN CANCER SCREENING PERFORMED TODAY.  TELANGIECTASIA Exam: dilated blood vessel(s) at left cheek and nose  Treatment Plan: Benign appearing on exam Call for changes Recommend daily broad spectrum sunscreen SPF 30+ to sun-exposed areas, reapply every 2 hours as needed. Call for new or changing lesions.  Staying in the shade or wearing long sleeves, sun glasses (UVA+UVB protection) and wide brim hats (4-inch brim around the entire circumference of the hat) are also recommended for sun protection.    ACTINIC DAMAGE - Chronic  condition, secondary to cumulative UV/sun exposure - diffuse scaly erythematous macules with underlying dyspigmentation - Recommend daily broad spectrum sunscreen SPF 30+ to sun-exposed areas, reapply every 2 hours as needed.  - Staying in the shade or wearing long sleeves, sun glasses (UVA+UVB protection) and wide brim hats (4-inch brim around the entire circumference of the hat) are also recommended for sun protection.  - Call for new or changing lesions.  LENTIGINES, SEBORRHEIC KERATOSES, HEMANGIOMAS - Benign normal skin lesions - SK's at right upper forehead, left zygoma and Left upper back (vs TV) - Benign-appearing - Call for any changes   MELANOCYTIC NEVI - 4 x 3 mm medium dark brown macule at left abdomen  - Tan-brown and/or pink-flesh-colored symmetric macules and papules - Benign appearing on exam today - Observation - Call clinic for new or changing moles - Recommend daily use of broad spectrum spf 30+ sunscreen to sun-exposed areas.   Dermatofibroma - Firm pink/brown papulenodule with dimple sign on left pretibia  and left buttock - Benign appearing - Call for any changes  Purpura - Chronic; persistent and recurrent.  Treatable, but not curable. - Violaceous macules and patches - Benign - Related to trauma, age, sun damage and/or use of blood thinners, chronic use of topical and/or oral steroids - Observe - Can use OTC arnica containing moisturizer such as Dermend Bruise Formula if desired - Call for worsening or other concerns  Varicose Veins/Spider Veins - Dilated blue, purple or red veins at the lower extremities - Reassured - Smaller vessels can be treated by sclerotherapy (a procedure to inject a medicine into the veins  to make them disappear) if desired, but the treatment is not covered by insurance. Larger vessels may be covered if symptomatic and we would refer to vascular surgeon if treatment desired.   ACTINIC KERATOSIS Left Forehead x 1 Actinic  keratoses are precancerous spots that appear secondary to cumulative UV radiation exposure/sun exposure over time. They are chronic with expected duration over 1 year. A portion of actinic keratoses will progress to squamous cell carcinoma of the skin. It is not possible to reliably predict which spots will progress to skin cancer and so treatment is recommended to prevent development of skin cancer.  Recommend daily broad spectrum sunscreen SPF 30+ to sun-exposed areas, reapply every 2 hours as needed.  Recommend staying in the shade or wearing long sleeves, sun glasses (UVA+UVB protection) and wide brim hats (4-inch brim around the entire circumference of the hat). Call for new or changing lesions. Destruction of lesion - Left Forehead x 1  Destruction method: cryotherapy   Informed consent: discussed and consent obtained   Lesion destroyed using liquid nitrogen: Yes   Region frozen until ice ball extended beyond lesion: Yes   Outcome: patient tolerated procedure well with no complications   Post-procedure details: wound care instructions given   Additional details:  Prior to procedure, discussed risks of blister formation, small wound, skin dyspigmentation, or rare scar following cryotherapy. Recommend Vaseline ointment to treated areas while healing.  INFLAMED SEBORRHEIC KERATOSIS left lower flank x 1 Symptomatic, irritating, patient would like treated. Destruction of lesion - left lower flank x 1  Destruction method: cryotherapy   Informed consent: discussed and consent obtained   Lesion destroyed using liquid nitrogen: Yes   Region frozen until ice ball extended beyond lesion: Yes   Outcome: patient tolerated procedure well with no complications   Post-procedure details: wound care instructions given   Additional details:  Prior to procedure, discussed risks of blister formation, small wound, skin dyspigmentation, or rare scar following cryotherapy. Recommend Vaseline ointment to  treated areas while healing.  Return in about 1 year (around 11/03/2024) for TBSE.  I, Asher Muir, CMA, am acting as scribe for Willeen Niece, MD.   Documentation: I have reviewed the above documentation for accuracy and completeness, and I agree with the above.  Willeen Niece, MD

## 2023-11-04 NOTE — Patient Instructions (Addendum)

## 2023-11-07 ENCOUNTER — Ambulatory Visit (INDEPENDENT_AMBULATORY_CARE_PROVIDER_SITE_OTHER): Admitting: Physician Assistant

## 2023-11-07 ENCOUNTER — Encounter (INDEPENDENT_AMBULATORY_CARE_PROVIDER_SITE_OTHER): Payer: Self-pay | Admitting: Physician Assistant

## 2023-11-07 VITALS — BP 111/66 | HR 85 | Temp 97.4°F | Ht 64.0 in | Wt 207.0 lb

## 2023-11-07 DIAGNOSIS — M170 Bilateral primary osteoarthritis of knee: Secondary | ICD-10-CM | POA: Diagnosis not present

## 2023-11-07 DIAGNOSIS — E669 Obesity, unspecified: Secondary | ICD-10-CM | POA: Diagnosis not present

## 2023-11-07 DIAGNOSIS — I10 Essential (primary) hypertension: Secondary | ICD-10-CM

## 2023-11-07 DIAGNOSIS — Z6835 Body mass index (BMI) 35.0-35.9, adult: Secondary | ICD-10-CM

## 2023-11-07 DIAGNOSIS — R7303 Prediabetes: Secondary | ICD-10-CM | POA: Diagnosis not present

## 2023-11-07 MED ORDER — METFORMIN HCL 500 MG PO TABS: 500.0000 mg | ORAL_TABLET | Freq: Two times a day (BID) | ORAL | 0 refills | Status: DC

## 2023-11-07 NOTE — Progress Notes (Unsigned)
   SUBJECTIVE:  Chief Complaint: Obesity  Interim History: ***  Kelliann is here to discuss her progress with her obesity treatment plan. She is on the Category 2 Plan and states she is following her eating plan approximately 50 % of the time. She states she is not exercising.   OBJECTIVE: Visit Diagnoses: Problem List Items Addressed This Visit       Cardiovascular and Mediastinum   Essential hypertension     Musculoskeletal and Integument   Osteoarthritis, knee     Other   Vitamin D deficiency   Pre-diabetes - Primary   Other Visit Diagnoses       Generalized obesity- Start BMI 37.0           Vitals Temp: (!) 97.4 F (36.3 C) BP: 111/66 Pulse Rate: 85 SpO2: 99 %   Anthropometric Measurements Height: 5\' 4"  (1.626 m) Weight: 207 lb (93.9 kg) BMI (Calculated): 35.51 Weight at Last Visit: 210 lb Weight Lost Since Last Visit: 3 Weight Gained Since Last Visit: 0 Starting Weight: 220 lb Total Weight Loss (lbs): 13 lb (5.897 kg)   Body Composition  Body Fat %: 46.2 % Fat Mass (lbs): 96 lbs Muscle Mass (lbs): 106 lbs Total Body Water (lbs): 76.4 lbs Visceral Fat Rating : 14   Other Clinical Data Today's Visit #: 13 Starting Date: 10/24/22     ASSESSMENT AND PLAN:  Diet: Charie is currently in the action stage of change. As such, her goal is to continue with weight loss efforts and has agreed to {MWMwtlossportion/plan2:23431}.   Exercise:  {MWM Exercise Recommendations:210964029}  Behavior Modification:  We discussed the following Behavioral Modification Strategies today: {HWW Behavior Modification:210964008}. We discussed various medication options to help Treanna with her weight loss efforts and we both agreed to ***.  No follow-ups on file.Marland Kitchen She was informed of the importance of frequent follow up visits to maximize her success with intensive lifestyle modifications for her multiple health conditions.  Attestation Statements:   Reviewed by  clinician on day of visit: allergies, medications, problem list, medical history, surgical history, family history, social history, and previous encounter notes.   Time spent on visit including pre-visit chart review and post-visit care and charting was *** minutes  Reuben Likes, MD

## 2023-11-07 NOTE — Progress Notes (Unsigned)
 SUBJECTIVE: Discussed the use of AI scribe software for clinical note transcription with the patient, who gave verbal consent to proceed.  Chief Complaint: Obesity  Interim History: She is down 3 lbs from last visit. Down 13 lbs overall TBW loss of 5.91%  Makayla [name] is here to discuss her progress with her obesity treatment plan. She is on the Category 2 Plan and states she is following her eating plan approximately 50 % of the time. 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   Makayla [name] is a 63 year old female who presents for follow-up of her obesity treatment plan.  She is managing her obesity with a treatment plan and has lost 3 pounds since her last visit, with a current BMI of 35.5. She consumes protein drinks and small meals due to convenience and stress, which may be impacting her nutrition.  Her mother, aged 64, is hospitalized with RSV and flu type A and is very ill.  She is managing her mother's care, which has affected her own nutrition and sleep.  She is hoping to see improvements in her status soon.   She has osteoarthritis in both knees and is planning to undergo left total knee arthroplasty this summer. She experiences knee pain that worsens with prolonged walking, causing her to limp. She has obtained surgical clearance but still needs cardiac clearance.  She is on metformin 500 mg twice daily for prediabetes management. Dietary challenges due to stress and convenience may affect her blood sugar control.  She is on Norvasc 2.5 mg daily for hypertension management. No specific issues with blood pressure control were discussed during the visit.  She is on vitamin D 5000 units daily for vitamin D deficiency. No specific issues were discussed during the visit.  OBJECTIVE: Visit Diagnoses: Problem List Items Addressed This Visit     Essential hypertension   Pre-diabetes - Primary   Relevant Medications   metFORMIN  (GLUCOPHAGE) 500 MG tablet   Osteoarthritis, knee   Other Visit Diagnoses       Generalized obesity- Start BMI 37.0       Relevant Medications   metFORMIN (GLUCOPHAGE) 500 MG tablet     BMI 35.0-35.9,adult Current BMI 35.6         Obesity BMI is 35.5 with a 3-pound weight loss since the last visit. Challenges in maintaining a balanced diet due to personal stressors, including her mother's illness. Currently consuming protein drinks and small meals, she acknowledges the need for better nutrition. Importance of protein intake, especially in preparation for upcoming knee surgery, was discussed. Meal planning and preparation were suggested to ensure adequate nutrition. Discussed potential use of Zepbound for weight management, considering cost. Zepbound may aid weight loss and reduce cardiovascular risks, though primarily studied in diabetics.  - Encourage meal planning and preparation to ensure adequate protein intake. - Consider using pre-prepared meals like Kevin's for convenience and nutrition. - Focus on increasing protein intake to aid in healing post-surgery. - Discuss potential use of Zepbound for weight management if needed at follow-up.  Osteoarthritis of the knees Osteoarthritis in both knees with planned left total knee arthroplasty in June. Experiences pain and discomfort, especially when walking long distances, affecting mobility and exercise capacity. Emphasized potential improvement in quality of life post-surgery. Discussed maintaining upper body strength in preparation for surgery. - Proceed with plans for left total knee arthroplasty in June. - Encourage upper body exercises  to maintain strength pre-surgery. - Consider pool therapy to reduce joint stress and improve mobility.  Prediabetes On metformin 500 mg twice daily for prediabetes management. Experiencing dietary challenges but maintaining medication regimen. Discussed diet's impact on condition and importance of  medication adherence. Lab Results  Component Value Date   HGBA1C 6.1 06/21/2023   HGBA1C 5.9 (H) 02/28/2023   HGBA1C 6.0 (H) 10/24/2022   Lab Results  Component Value Date   LDLCALC 147 (H) 06/21/2023   CREATININE 0.94 06/21/2023   INSULIN  Date Value Ref Range Status  10/24/2022 16.1 2.6 - 24.9 uIU/mL Final  ] - Continue metformin 500 mg twice daily. - Refill metformin prescription at [facility]. Continue working on nutrition plan to decrease simple carbohydrates, increase lean proteins and exercise to promote weight loss, improve glycemic control and prevent progression to Type 2 diabetes.  Meds ordered this encounter  Medications   metFORMIN (GLUCOPHAGE) 500 MG tablet    Sig: Take 1 tablet (500 mg total) by mouth 2 (two) times daily with a meal.    Dispense:  180 tablet    Refill:  0    Hypertension On Norvasc 2.5 mg daily for blood pressure control.  BP Readings from Last 3 Encounters:  11/07/23 111/66  09/30/23 124/76  09/02/23 128/84  indicating well-managed condition. Continue Norvasc Continue to work on nutrition plan to promote weight loss and improve BP control.    Vitamin D Deficiency On vitamin D 5000 units daily for deficiency.  Last vitamin D Lab Results  Component Value Date   VD25OH 56.4 10/24/2022   Continue vitamin D 5000 units daily Low vitamin D levels can be associated with adiposity and may result in leptin resistance and weight gain. Also associated with fatigue.  Currently on vitamin D supplementation without any adverse effects such as nausea, vomiting or muscle weakness.   Follow-up Scheduled for follow-up in six weeks to reassess progress and discuss potential weight management options, including Zepbound if needed. - Schedule follow-up appointment in six weeks on May 12th at 10 AM.  Vitals Temp: (!) 97.4 F (36.3 C) BP: 111/66 Pulse Rate: 85 SpO2: 99 %   Anthropometric Measurements Height: 5\' 4"  (1.626 m) Weight: 207 lb (93.9  kg) BMI (Calculated): 35.51 Weight at Last Visit: 210 lb Weight Lost Since Last Visit: 3 Weight Gained Since Last Visit: 0 Starting Weight: 220 lb Total Weight Loss (lbs): 13 lb (5.897 kg)   Body Composition  Body Fat %: 46.2 % Fat Mass (lbs): 96 lbs Muscle Mass (lbs): 106 lbs Total Body Water (lbs): 76.4 lbs Visceral Fat Rating : 14   Other Clinical Data Today's Visit #: 13 Starting Date: 10/24/22     ASSESSMENT AND PLAN:  Diet: Ianna is currently in the action stage of change. As such, her goal is to continue with weight loss efforts. She has agreed to Category 2 Plan.  Exercise: Gearl has been instructed to try a geriatric exercise plan and that some exercise is better than none for weight loss and overall health benefits.   Behavior Modification:  We discussed the following Behavioral Modification Strategies today: increasing lean protein intake, decreasing simple carbohydrates, increasing vegetables, increase H2O intake, increase high fiber foods, meal planning and cooking strategies, avoiding temptations, and planning for success. We discussed various medication options to help Shalie with her weight loss efforts and we both agreed to continue current medical weight loss plan.  Return in about 6 weeks (around 12/19/2023).Marland Kitchen She was informed of the importance  of frequent follow up visits to maximize her success with intensive lifestyle modifications for her multiple health conditions.  Attestation Statements:   Reviewed by clinician on day of visit: allergies, medications, problem list, medical history, surgical history, family history, social history, and previous encounter notes.   Time spent on visit including pre-visit chart review and post-visit care and charting was 29 minutes.    Zenia Guest, PA-C

## 2023-12-06 ENCOUNTER — Encounter: Payer: Self-pay | Admitting: Cardiology

## 2023-12-06 ENCOUNTER — Ambulatory Visit: Payer: Managed Care, Other (non HMO) | Attending: Cardiology | Admitting: Cardiology

## 2023-12-06 VITALS — BP 132/86 | HR 88 | Ht 63.0 in | Wt 212.4 lb

## 2023-12-06 DIAGNOSIS — E7800 Pure hypercholesterolemia, unspecified: Secondary | ICD-10-CM | POA: Diagnosis not present

## 2023-12-06 DIAGNOSIS — I10 Essential (primary) hypertension: Secondary | ICD-10-CM | POA: Diagnosis not present

## 2023-12-06 DIAGNOSIS — Z01810 Encounter for preprocedural cardiovascular examination: Secondary | ICD-10-CM

## 2023-12-06 MED ORDER — ATORVASTATIN CALCIUM 20 MG PO TABS: 20.0000 mg | ORAL_TABLET | Freq: Every day | ORAL | 3 refills | Status: DC

## 2023-12-06 NOTE — Patient Instructions (Signed)
 Medication Instructions:  Your physician recommends the following medication changes. START TAKING: Lipitor 20 mg once daily at bedtime   If you need a refill on your cardiac medications before your next appointment, please call your pharmacy*  Lab Work: Your provider would like for you to return in 3 MONTHS to have the following labs drawn: LIPID PANEL.   Please go to [facility] [address] ([facility] [address], [place] [address] You do not need an appointment.  They are open from 8 am- 4:30 pm.  Lunch from 1:00 pm- 2:00 pm You DO need to be fasting.  Testing/Procedures: No test ordered today   Follow-Up: At [facility], you and your health needs are our priority.  As part of our continuing mission to provide you with exceptional heart care, our providers are all part of one team.  This team includes your primary Cardiologist (physician) and Advanced Practice Providers or APPs (Physician Assistants and Nurse Practitioners) who all work together to provide you with the care you need, when you need it.  Your next appointment:   5 month(s)  Provider:   You may see Dr. Junnie Olives or one of the following Advanced Practice Providers on your designated Care Team:   Laneta Pintos, NP Gildardo Labrador, PA-C Varney Gentleman, PA-C Cadence Theba, PA-C Ronald Cockayne, NP Morey Ar, NP    We recommend signing up for the patient portal called "MyChart".  Sign up information is provided on this After Visit Summary.  MyChart is used to connect with patients for Virtual Visits (Telemedicine).  Patients are able to view lab/test results, encounter notes, upcoming appointments, etc.  Non-urgent messages can be sent to your provider as well.   To learn more about what you can do with MyChart, go to [email or web address].

## 2023-12-06 NOTE — Progress Notes (Signed)
 Cardiology Office Note:    Date:  12/06/2023   ID:  Makayla [name], DOB [birth year], MRN [id number]  PCP:  Clemens Curt, MD   [facility] Providers Cardiologist:  None     Referring MD: Wes Hamman, MD   Chief Complaint  Patient presents with   Establish care    Establish care and for preoperative clearance. Patient is doing well on today. Meds reviewed.    Makayla [name] is a 63 y.o. female who is being seen today for the evaluation of cardiac risk prior to surgery at the request of Wes Hamman, MD.   History of Present Illness:    Makayla [name] is a 63 y.o. female with a hx of hypertension, hyperlipidemia, osteoarthritis of left knee who presents for preop evaluation.  Patient has left the osteoarthritis, surgical management being planned.  Previously seen by [facility] cardiology from a cardiac perspective.  Seen for palpitations and tachycardia back in 2020.  She states losing her husband that same year.  Cardiac monitor 10/2018 showed no significant arrhythmias with rare PACs and PVCs.  Symptoms of palpitations were in the context.  Has been doing okay since, has left knee pain.  Denies any palpitations recently.   Prior testing  ETT 03/2022 no ischemic ECG changes. Echo 10/2018 normal EF 55 to 60%.   Past Medical History:  Diagnosis Date   Allergy    pollen   Anxiety    Bursitis, trochanteric    bi/lat    Dental crowns present    x 4   Diverticulosis    Fatty liver    GERD (gastroesophageal reflux disease)    Hyperlipidemia    no current med.   Hypertension    white coat syndrome,   Mass of breast, left 01/2012   Obesity    Palpitations    PONV (postoperative nausea and vomiting)    Pre-diabetes    Pre-diabetes    Vitamin D  deficiency     Past Surgical History:  Procedure Laterality Date   ABDOMINAL HYSTERECTOMY     partial   ANTERIOR AND POSTERIOR REPAIR WITH SACROSPINOUS FIXATION N/A 08/20/2022   Procedure: POSTERIOR REPAIR WITH PERINEORRHAPHY AND  WITH  SACROSPINOUS FIXATION;  Surgeon: Arma Lamp, MD;  Location: [facility];  Service: Gynecology;  Laterality: N/A;   BREAST BIOPSY Right 08/04/2021   BREAST EXCISIONAL BIOPSY Left 2013   BREAST SURGERY  01/10/2012   lumpectomy - left (excisional biopsy benign)   CHOLECYSTECTOMY N/A 02/10/2016   Procedure: LAPAROSCOPIC CHOLECYSTECTOMY WITH INTRAOPERATIVE CHOLANGIOGRAM;  Surgeon: Lillette Reid III, MD;  Location: [facility] OR;  Service: General;  Laterality: N/A;   COLONOSCOPY  04/06/2010   D Brodie, normal w/tics   RECTOCELE REPAIR  06/14/2011   cystocele repair, pt states Dr Manuel Sell did not do rectocele and said she had to come back later.   ROBOTIC ASSISTED LAPAROSCOPIC SACROCOLPOPEXY  06/14/2011   Procedure: ROBOTIC ASSISTED LAPAROSCOPIC SACROCOLPOPEXY;  Surgeon: Kristeen Peto, MD;  Location: [facility];  Service: Urology;  Laterality: N/A;   TUBAL LIGATION      Current Medications: Current Meds  Medication Sig   acetaminophen  (TYLENOL ) 500 MG tablet Take 1 tablet (500 mg total) by mouth every 6 (six) hours as needed (pain).   amLODipine  (NORVASC ) 2.5 MG tablet Take 1 tablet (2.5 mg total) by mouth daily.   atorvastatin (LIPITOR) 20 MG tablet Take 1 tablet (20 mg total) by mouth daily.   b complex vitamins  capsule Take 1 capsule by mouth daily.   esomeprazole  (NEXIUM ) 20 MG capsule Take 20 mg by mouth 2 (two) times daily before a meal.   estradiol  (ESTRACE ) 0.1 MG/GM vaginal cream Place 0.5 g vaginally 2 (two) times a week. Place 0.5g nightly for two weeks then twice a week after   fluticasone  (FLONASE ) 50 MCG/ACT nasal spray Place 2 sprays into both nostrils daily.   ibuprofen  (ADVIL ) 800 MG tablet Take 1 tablet (800 mg total) by mouth daily as needed for moderate pain (pain score 4-6). With a meal   loratadine (CLARITIN) 10 MG tablet Take 10 mg by mouth daily as needed for allergies.   metFORMIN  (GLUCOPHAGE ) 500 MG tablet Take 1 tablet (500 mg total) by mouth 2 (two)  times daily with a meal.   sertraline  (ZOLOFT ) 25 MG tablet Take 1 tablet (25 mg total) by mouth daily.   Vitamin D , Cholecalciferol, 25 MCG (1000 UT) TABS Take 5,000 Units by mouth daily.     Allergies:   Buspar  [buspirone ]   Social History   Socioeconomic History   Marital status: Widowed    Spouse name: Not on file   Number of children: 1   Years of education: Not on file   Highest education level: Associate degree: academic program  Occupational History   Occupation: RN-retired  Tobacco Use   Smoking status: Never    Passive exposure: Past   Smokeless tobacco: Never  Vaping Use   Vaping status: Never Used  Substance and Sexual Activity   Alcohol use: No    Alcohol/week: 0.0 standard drinks of alcohol   Drug use: No   Sexual activity: Not Currently    Birth control/protection: Post-menopausal    Comment: has used estradiol  patch 0.025  in the past, has stopped  Other Topics Concern   Not on file  Social History Narrative   Not on file   Social Drivers of Health   Financial Resource Strain: Low Risk  (08/30/2023)   Overall Financial Resource Strain (CARDIA)    Difficulty of Paying Living Expenses: Not hard at all  Food Insecurity: No Food Insecurity (08/30/2023)   Hunger Vital Sign    Worried About Running Out of Food in the Last Year: Never true    Ran Out of Food in the Last Year: Never true  Transportation Needs: No Transportation Needs (08/30/2023)   PRAPARE - Administrator, Civil Service (Medical): No    Lack of Transportation (Non-Medical): No  Physical Activity: Insufficiently Active (08/30/2023)   Exercise Vital Sign    Days of Exercise per Week: 2 days    Minutes of Exercise per Session: 20 min  Stress: No Stress Concern Present (08/30/2023)   [employer] of Occupational Health - Occupational Stress Questionnaire    Feeling of Stress : Not at all  Social Connections: Moderately Integrated (08/30/2023)   Social Connection and Isolation  Panel [NHANES]    Frequency of Communication with Friends and Family: More than three times a week    Frequency of Social Gatherings with Friends and Family: More than three times a week    Attends Religious Services: More than 4 times per year    Active Member of [employer] or Organizations: Yes    Attends Banker Meetings: More than 4 times per year    Marital Status: Widowed     Family History: The patient's family history includes Breast cancer (age of onset: 48) in her mother; Depression in her sister;  Diabetes in her mother; GER disease in her father; Heart attack in her maternal grandmother; Heart disease in her brother; Hyperlipidemia in her mother; Hypertension in her maternal grandmother and mother; Uterine cancer in her mother. There is no history of Colon cancer, Colon polyps, Esophageal cancer, Rectal cancer, or Stomach cancer.  ROS:   Please see the history of present illness.     All other systems reviewed and are negative.  EKGs/Labs/Other Studies Reviewed:    The following studies were reviewed today:  EKG Interpretation Date/Time:  Friday Dec 06 2023 09:28:08 EDT Ventricular Rate:  88 PR Interval:  158 QRS Duration:  86 QT Interval:  392 QTC Calculation: 474 R Axis:   -25  Text Interpretation: Normal sinus rhythm Nonspecific T wave abnormality Confirmed by Constancia Delton ([id number]) on 12/06/2023 9:48:34 AM    Recent Labs: 06/21/2023: ALT 11; BUN 11; Creatinine, Ser 0.94; Hemoglobin 13.9; Platelets 176.0; Potassium 4.2; Sodium 140; TSH 3.16  Recent Lipid Panel    Component Value Date/Time   CHOL 231 (H) 06/21/2023 0846   CHOL 217 (H) 02/28/2023 0848   TRIG 144.0 06/21/2023 0846   HDL 55.10 06/21/2023 0846   HDL 54 02/28/2023 0848   CHOLHDL 4 06/21/2023 0846   VLDL 28.8 06/21/2023 0846   LDLCALC 147 (H) 06/21/2023 0846   LDLCALC 140 (H) 02/28/2023 0848     Risk Assessment/Calculations:             Physical Exam:    VS:  BP 132/86   Pulse  88   Ht 5\' 3"  (1.6 m)   Wt 212 lb 6.4 oz (96.3 kg)   SpO2 95%   BMI 37.62 kg/m     Wt Readings from Last 3 Encounters:  12/06/23 212 lb 6.4 oz (96.3 kg)  11/07/23 207 lb (93.9 kg)  09/30/23 210 lb (95.3 kg)     GEN:  Well nourished, well developed in no acute distress HEENT: Normal NECK: No JVD; No carotid bruits CARDIAC: RRR, no murmurs, rubs, gallops RESPIRATORY:  Clear to auscultation without rales, wheezing or rhonchi  ABDOMEN: Soft, non-tender, non-distended MUSCULOSKELETAL:  No edema; No deformity  SKIN: Warm and dry NEUROLOGIC:  Alert and oriented x 3 PSYCHIATRIC:  Normal affect   ASSESSMENT:    1. Pre-procedural cardiovascular examination   2. Primary hypertension   3. Pure hypercholesterolemia    PLAN:    In order of problems listed above:  Preprocedural exam, left knee surgery being planned.  Previous workup with ETT and echocardiogram showed no significant structural abnormalities.  Patient clinically asymptomatic.  Okay to proceed with surgical procedure from a cardiac perspective. Hypertension, BP controlled.  Continue amlodipine  2.5 mg daily. Hyperlipidemia, start Lipitor 20 mg daily.  Recheck lipid panel in 3 months.  Follow-up in 3 months.      Medication Adjustments/Labs and Tests Ordered: Current medicines are reviewed at length with the patient today.  Concerns regarding medicines are outlined above.  Orders Placed This Encounter  Procedures   Lipid panel   EKG 12-Lead   Meds ordered this encounter  Medications   atorvastatin (LIPITOR) 20 MG tablet    Sig: Take 1 tablet (20 mg total) by mouth daily.    Dispense:  90 tablet    Refill:  3    Patient Instructions  Medication Instructions:  Your physician recommends the following medication changes. START TAKING: Lipitor 20 mg once daily at bedtime   If you need a refill on your cardiac medications before your  next appointment, please call your pharmacy*  Lab Work: Your provider would  like for you to return in 3 MONTHS to have the following labs drawn: LIPID PANEL.   Please go to [facility] [address] ([facility] [address], [place] [address] You do not need an appointment.  They are open from 8 am- 4:30 pm.  Lunch from 1:00 pm- 2:00 pm You DO need to be fasting.  Testing/Procedures: No test ordered today   Follow-Up: At [facility], you and your health needs are our priority.  As part of our continuing mission to provide you with exceptional heart care, our providers are all part of one team.  This team includes your primary Cardiologist (physician) and Advanced Practice Providers or APPs (Physician Assistants and Nurse Practitioners) who all work together to provide you with the care you need, when you need it.  Your next appointment:   5 month(s)  Provider:   You may see Dr. Junnie Olives or one of the following Advanced Practice Providers on your designated Care Team:   Laneta Pintos, NP Gildardo Labrador, PA-C Varney Gentleman, PA-C Cadence [place], PA-C Ronald Cockayne, NP Morey Ar, NP    We recommend signing up for the patient portal called "MyChart".  Sign up information is provided on this After Visit Summary.  MyChart is used to connect with patients for Virtual Visits (Telemedicine).  Patients are able to view lab/test results, encounter notes, upcoming appointments, etc.  Non-urgent messages can be sent to your provider as well.   To learn more about what you can do with MyChart, go to [email or web address].          Signed, Constancia Delton, MD  12/06/2023 10:31 AM    [facility]

## 2023-12-15 NOTE — Progress Notes (Unsigned)
   SUBJECTIVE: Discussed the use of AI scribe software for clinical note transcription with the patient, who gave verbal consent to proceed.  Chief Complaint: Obesity  Interim History: ***  Anijah is here to discuss her progress with her obesity treatment plan. She is on the {HWW Weight Loss Plan:210964005} and states she {CHL AMB IS/IS NOT:210130109} following her eating plan approximately *** % of the time. She states she {CHL AMB IS/IS NOT:210130109} exercising *** minutes *** times per week.   OBJECTIVE: Visit Diagnoses: Problem List Items Addressed This Visit     Vitamin D deficiency   Essential hypertension   Pre-diabetes - Primary   Osteoarthritis, knee   Other Visit Diagnoses       Generalized obesity- Start BMI 37.0           No data recorded No data recorded No data recorded No data recorded   ASSESSMENT AND PLAN:  Diet: Shamirah {CHL AMB IS/IS NOT:210130109} currently in the action stage of change. As such, her goal is to {HWW Weight Loss Efforts:210964006}. She {HAS HAS UEA:54098} agreed to {HWW Weight Loss Plan:210964005}.  Exercise: Keashia has been instructed {HWW Exercise:210964007} for weight loss and overall health benefits.   Behavior Modification:  We discussed the following Behavioral Modification Strategies today: {HWW Behavior Modification:210964008}. We discussed various medication options to help Marjorie with her weight loss efforts and we both agreed to ***.  No follow-ups on file.Marland Kitchen She was informed of the importance of frequent follow up visits to maximize her success with intensive lifestyle modifications for her multiple health conditions.  Attestation Statements:   Reviewed by clinician on day of visit: allergies, medications, problem list, medical history, surgical history, family history, social history, and previous encounter notes.   Time spent on visit including pre-visit chart review and post-visit care and charting was *** minutes.     Richrd Kuzniar, PA-C

## 2023-12-16 ENCOUNTER — Ambulatory Visit (INDEPENDENT_AMBULATORY_CARE_PROVIDER_SITE_OTHER): Admitting: Physician Assistant

## 2023-12-16 DIAGNOSIS — E559 Vitamin D deficiency, unspecified: Secondary | ICD-10-CM

## 2023-12-16 DIAGNOSIS — M170 Bilateral primary osteoarthritis of knee: Secondary | ICD-10-CM

## 2023-12-16 DIAGNOSIS — I10 Essential (primary) hypertension: Secondary | ICD-10-CM

## 2023-12-16 DIAGNOSIS — R7303 Prediabetes: Secondary | ICD-10-CM

## 2023-12-16 DIAGNOSIS — E669 Obesity, unspecified: Secondary | ICD-10-CM

## 2023-12-24 ENCOUNTER — Other Ambulatory Visit: Payer: Self-pay | Admitting: Physician Assistant

## 2023-12-24 MED ORDER — OXYCODONE-ACETAMINOPHEN 5-325 MG PO TABS
1.0000 | ORAL_TABLET | Freq: Four times a day (QID) | ORAL | 0 refills | Status: DC | PRN
Start: 2023-12-24 — End: 2024-01-14

## 2023-12-24 MED ORDER — METHOCARBAMOL 750 MG PO TABS: 750.0000 mg | ORAL_TABLET | Freq: Three times a day (TID) | ORAL | 2 refills | Status: DC | PRN

## 2023-12-24 MED ORDER — DOCUSATE SODIUM 100 MG PO CAPS: 100.0000 mg | ORAL_CAPSULE | Freq: Every day | ORAL | 2 refills | Status: AC | PRN

## 2023-12-24 MED ORDER — ONDANSETRON HCL 4 MG PO TABS: 4.0000 mg | ORAL_TABLET | Freq: Three times a day (TID) | ORAL | 0 refills | Status: AC | PRN

## 2023-12-26 NOTE — Progress Notes (Signed)
 Surgical Instructions   Your procedure is scheduled on Monday January 06, 2024. Report to [facility] Main Entrance "A" at 8:00 A.M., then check in with the Admitting office. Any questions or running late day of surgery: call [phone]  Questions prior to your surgery date: call [phone], Monday-Friday, 8am-4pm. If you experience any cold or flu symptoms such as cough, fever, chills, shortness of breath, etc. between now and your scheduled surgery, please notify us  at the above number.     Remember:  Do not eat after midnight the night before your surgery  You may drink clear liquids until 7:25 the morning of your surgery.   Clear liquids allowed are: Water, Non-Citrus Juices (without pulp), Carbonated Beverages, Clear Tea (no milk, honey, etc.), Black Coffee Only (NO MILK, CREAM OR POWDERED CREAMER of any kind), and Gatorade.  Patient Instructions  The night before surgery:  No food after midnight. ONLY clear liquids after midnight  The day of surgery (if you do NOT have diabetes):  Drink ONE (1) Pre-Surgery Clear Ensure by 7:25 the morning of surgery. Drink in one sitting. Do not sip.  This drink was given to you during your hospital  pre-op appointment visit.  Nothing else to drink after completing the  Pre-Surgery Clear Ensure.         If you have questions, please contact your surgeon's office.  Take these medicines the morning of surgery with A SIP OF WATER  atorvastatin  (LIPITOR)  esomeprazole  (NEXIUM )  sertraline  (ZOLOFT )   May take these medicines IF NEEDED: acetaminophen  (TYLENOL )  fluticasone  (FLONASE )  loratadine (CLARITIN)  methocarbamol  (ROBAXIN )  ondansetron  (ZOFRAN )   DO NOT TAKE YOUR metFORMIN  (GLUCOPHAGE ) THE MORNING OF SURGERY   One week prior to surgery, STOP taking any Aspirin (unless otherwise instructed by your surgeon) Aleve , Naproxen , Ibuprofen , Motrin , Advil , Goody's, BC's, all herbal medications, fish oil, and non-prescription vitamins.                      Do NOT Smoke (Tobacco/Vaping) for 24 hours prior to your procedure.  If you use a CPAP at night, you may bring your mask/headgear for your overnight stay.   You will be asked to remove any contacts, glasses, piercing's, hearing aid's, dentures/partials prior to surgery. Please bring cases for these items if needed.    Patients discharged the day of surgery will not be allowed to drive home, and someone needs to stay with them for 24 hours.  SURGICAL WAITING ROOM VISITATION Patients may have no more than 2 support people in the waiting area - these visitors may rotate.   Pre-op nurse will coordinate an appropriate time for 1 ADULT support person, who may not rotate, to accompany patient in pre-op.  Children under the age of 33 must have an adult with them who is not the patient and must remain in the main waiting area with an adult.  If the patient needs to stay at the hospital during part of their recovery, the visitor guidelines for inpatient rooms apply.  Please refer to the [facility] website for the visitor guidelines for any additional information.   If you received a COVID test during your pre-op visit  it is requested that you wear a mask when out in public, stay away from anyone that may not be feeling well and notify your surgeon if you develop symptoms. If you have been in contact with anyone that has tested positive in the last 10 days please notify you  Careers adviser.      Pre-operative 5 CHG Bathing Instructions   You can play a key role in reducing the risk of infection after surgery. Your skin needs to be as free of germs as possible. You can reduce the number of germs on your skin by washing with CHG (chlorhexidine  gluconate) soap before surgery. CHG is an antiseptic soap that kills germs and continues to kill germs even after washing.   DO NOT use if you have an allergy to chlorhexidine /CHG or antibacterial soaps. If your skin becomes reddened or irritated, stop  using the CHG and notify one of our RNs at [phone].   Please shower with the CHG soap starting 4 days before surgery using the following schedule:     Please keep in mind the following:  DO NOT shave, including legs and underarms, starting the day of your first shower.   You may shave your face at any point before/day of surgery.  Place clean sheets on your bed the day you start using CHG soap. Use a clean washcloth (not used since being washed) for each shower. DO NOT sleep with pets once you start using the CHG.   CHG Shower Instructions:  Wash your face and private area with normal soap. If you choose to wash your hair, wash first with your normal shampoo.  After you use shampoo/soap, rinse your hair and body thoroughly to remove shampoo/soap residue.  Turn the water OFF and apply about 3 tablespoons (45 ml) of CHG soap to a CLEAN washcloth.  Apply CHG soap ONLY FROM YOUR NECK DOWN TO YOUR TOES (washing for 3-5 minutes)  DO NOT use CHG soap on face, private areas, open wounds, or sores.  Pay special attention to the area where your surgery is being performed.  If you are having back surgery, having someone wash your back for you may be helpful. Wait 2 minutes after CHG soap is applied, then you may rinse off the CHG soap.  Pat dry with a clean towel  Put on clean clothes/pajamas   If you choose to wear lotion, please use ONLY the CHG-compatible lotions that are listed below.  Additional instructions for the day of surgery: DO NOT APPLY any lotions, deodorants, cologne, or perfumes.   Do not bring valuables to the hospital. [facility] is not responsible for any belongings/valuables. Do not wear nail polish, gel polish, artificial nails, or any other type of covering on natural nails (fingers and toes) Do not wear jewelry or makeup Put on clean/comfortable clothes.  Please brush your teeth.  Ask your nurse before applying any prescription medications to the skin.     CHG  Compatible Lotions   Aveeno Moisturizing lotion  Cetaphil Moisturizing Cream  Cetaphil Moisturizing Lotion  Clairol Herbal Essence Moisturizing Lotion, Dry Skin  Clairol Herbal Essence Moisturizing Lotion, Extra Dry Skin  Clairol Herbal Essence Moisturizing Lotion, Normal Skin  Curel Age Defying Therapeutic Moisturizing Lotion with Alpha Hydroxy  Curel Extreme Care Body Lotion  Curel Soothing Hands Moisturizing Hand Lotion  Curel Therapeutic Moisturizing Cream, Fragrance-Free  Curel Therapeutic Moisturizing Lotion, Fragrance-Free  Curel Therapeutic Moisturizing Lotion, Original Formula  Eucerin Daily Replenishing Lotion  Eucerin Dry Skin Therapy Plus Alpha Hydroxy Crme  Eucerin Dry Skin Therapy Plus Alpha Hydroxy Lotion  Eucerin Original Crme  Eucerin Original Lotion  Eucerin Plus Crme Eucerin Plus Lotion  Eucerin TriLipid Replenishing Lotion  Keri Anti-Bacterial Hand Lotion  Keri Deep Conditioning Original Lotion Dry Skin Formula Softly Scented  Keri Deep  Conditioning Original Lotion, Fragrance Free Sensitive Skin Formula  Keri Lotion Fast Absorbing Fragrance Free Sensitive Skin Formula  Keri Lotion Fast Absorbing Softly Scented Dry Skin Formula  Keri Original Lotion  Keri Skin Renewal Lotion Keri Silky Smooth Lotion  Keri Silky Smooth Sensitive Skin Lotion  Nivea Body Creamy Conditioning Oil  Nivea Body Extra Enriched Lotion  Nivea Body Original Lotion  Nivea Body Sheer Moisturizing Lotion Nivea Crme  Nivea Skin Firming Lotion  NutraDerm 30 Skin Lotion  NutraDerm Skin Lotion  NutraDerm Therapeutic Skin Cream  NutraDerm Therapeutic Skin Lotion  ProShield Protective Hand Cream  Provon moisturizing lotion  Please read over the following fact sheets that you were given.

## 2023-12-27 ENCOUNTER — Other Ambulatory Visit: Payer: Self-pay

## 2023-12-27 ENCOUNTER — Encounter (HOSPITAL_COMMUNITY): Payer: Self-pay

## 2023-12-27 ENCOUNTER — Encounter (HOSPITAL_COMMUNITY)
Admission: RE | Admit: 2023-12-27 | Discharge: 2023-12-27 | Disposition: A | Discharge status: Home / Self Care | Source: Ambulatory Visit | Attending: Orthopaedic Surgery | Admitting: Orthopaedic Surgery

## 2023-12-27 VITALS — BP 109/87 | HR 88 | Temp 97.8°F | Resp 18 | Ht 63.0 in | Wt 215.5 lb

## 2023-12-27 DIAGNOSIS — Z01812 Encounter for preprocedural laboratory examination: Secondary | ICD-10-CM | POA: Insufficient documentation

## 2023-12-27 DIAGNOSIS — K760 Fatty (change of) liver, not elsewhere classified: Secondary | ICD-10-CM | POA: Insufficient documentation

## 2023-12-27 DIAGNOSIS — I10 Essential (primary) hypertension: Secondary | ICD-10-CM | POA: Insufficient documentation

## 2023-12-27 DIAGNOSIS — M1712 Unilateral primary osteoarthritis, left knee: Secondary | ICD-10-CM | POA: Insufficient documentation

## 2023-12-27 DIAGNOSIS — Z7984 Long term (current) use of oral hypoglycemic drugs: Secondary | ICD-10-CM | POA: Diagnosis not present

## 2023-12-27 DIAGNOSIS — F419 Anxiety disorder, unspecified: Secondary | ICD-10-CM | POA: Diagnosis not present

## 2023-12-27 DIAGNOSIS — K219 Gastro-esophageal reflux disease without esophagitis: Secondary | ICD-10-CM | POA: Insufficient documentation

## 2023-12-27 DIAGNOSIS — E785 Hyperlipidemia, unspecified: Secondary | ICD-10-CM | POA: Diagnosis not present

## 2023-12-27 DIAGNOSIS — S83242A Other tear of medial meniscus, current injury, left knee, initial encounter: Secondary | ICD-10-CM | POA: Insufficient documentation

## 2023-12-27 DIAGNOSIS — Z01818 Encounter for other preprocedural examination: Secondary | ICD-10-CM

## 2023-12-27 DIAGNOSIS — R7303 Prediabetes: Secondary | ICD-10-CM | POA: Insufficient documentation

## 2023-12-27 LAB — CBC
HCT: 43 % (ref 36.0–46.0)
Hemoglobin: 13.8 g/dL (ref 12.0–15.0)
MCH: 25.7 pg — ABNORMAL LOW (ref 26.0–34.0)
MCHC: 32.1 g/dL (ref 30.0–36.0)
MCV: 79.9 fL — ABNORMAL LOW (ref 80.0–100.0)
Platelets: 174 10*3/uL (ref 150–400)
RBC: 5.38 MIL/uL — ABNORMAL HIGH (ref 3.87–5.11)
RDW: 15.3 % (ref 11.5–15.5)
WBC: 9.5 10*3/uL (ref 4.0–10.5)
nRBC: 0 % (ref 0.0–0.2)

## 2023-12-27 LAB — BASIC METABOLIC PANEL WITH GFR
Anion gap: 7 (ref 5–15)
BUN: 12 mg/dL (ref 8–23)
CO2: 27 mmol/L (ref 22–32)
Calcium: 9.5 mg/dL (ref 8.9–10.3)
Chloride: 104 mmol/L (ref 98–111)
Creatinine, Ser: 0.8 mg/dL (ref 0.44–1.00)
GFR, Estimated: >60 mL/min (ref >60)
Glucose, Bld: 95 mg/dL (ref 70–99)
Potassium: 4.3 mmol/L (ref 3.5–5.1)
Sodium: 138 mmol/L (ref 135–145)

## 2023-12-27 LAB — SURGICAL PCR SCREEN
MRSA, PCR: NEGATIVE
Staphylococcus aureus: NEGATIVE

## 2023-12-27 NOTE — Progress Notes (Signed)
 PCP - Dr. Deri Fleet Cardiologist - Dr. Alanna Hu, LOV, 12/06/2023, clearance received  PPM/ICD - denies Device Orders -na Rep Notified - na  Chest x-ray - 03/01/2023 EKG - 12/06/2023 Stress Test - 03/12/2022, [facility], [facility] ECHO - 10/28/2018, [facility], [facility] Cardiac Cath -   Sleep Study -  denies CPAP - na  Non-diabetic  Blood Thinner Instructions: denies Aspirin Instructions:denies  ERAS Protcol -Ensure until 0725  Anesthesia review: Yes. HTN, HLD, heart clearance, Hx palpitations,   Patient denies shortness of breath, fever, cough and chest pain at PAT appointment   All instructions explained to the patient, with a verbal understanding of the material. Patient agrees to go over the instructions while at home for a better understanding. Patient also instructed to self quarantine after being tested for COVID-19. The opportunity to ask questions was provided.

## 2023-12-31 NOTE — Anesthesia Preprocedure Evaluation (Addendum)
 Anesthesia Evaluation  Patient identified by MRN, date of birth, ID band Patient awake    Reviewed: Allergy & Precautions, NPO status , Patient's Chart, lab work & pertinent test results  History of Anesthesia Complications (+) PONV and history of anesthetic complications  Airway Mallampati: II  TM Distance: >3 FB Neck ROM: Full    Dental no notable dental hx. (+) Teeth Intact, Dental Advisory Given   Pulmonary neg pulmonary ROS   Pulmonary exam normal breath sounds clear to auscultation       Cardiovascular hypertension, Pt. on medications Normal cardiovascular exam Rhythm:Regular Rate:Normal     Neuro/Psych  PSYCHIATRIC DISORDERS Anxiety     negative neurological ROS     GI/Hepatic ,GERD  Medicated and Controlled,,  Endo/Other    Renal/GU negative Renal ROS     Musculoskeletal  (+) Arthritis , Osteoarthritis,    Abdominal  (+) + obese (BMI 38.2)  Peds  Hematology Lab Results      Component                Value               Date                      WBC                      9.5                 12/27/2023                HGB                      13.8                12/27/2023                HCT                      43.0                12/27/2023                MCV                      79.9 (L)            12/27/2023                PLT                      174                 12/27/2023              Anesthesia Other Findings All: Buspar   Reproductive/Obstetrics                             Anesthesia Physical Anesthesia Plan  ASA: 2  Anesthesia Plan: Spinal and Regional   Post-op Pain Management: Regional block* and Minimal or no pain anticipated   Induction:   PONV Risk Score and Plan: 3 and Propofol  infusion, Treatment may vary due to age or medical condition, Midazolam  and Ondansetron   Airway Management Planned: Natural Airway and Nasal Cannula  Additional Equipment:  None  Intra-op Plan:   Post-operative Plan:   Informed Consent:  I have reviewed the patients History and Physical, chart, labs and discussed the procedure including the risks, benefits and alternatives for the proposed anesthesia with the patient or authorized representative who has indicated his/her understanding and acceptance.     Dental advisory given  Plan Discussed with: CRNA and Surgeon  Anesthesia Plan Comments: (PAT note written 12/31/2023 by Allison Zelenak, PA-C.  Spinal w L adductor canal block  )       Anesthesia Quick Evaluation

## 2023-12-31 NOTE — Progress Notes (Signed)
 Anesthesia Chart Review:  Case: [id number] Date/Time: 01/06/24 1010   Procedure: LEFT TOTAL KNEE ARTHROPLASTY (Left: Knee)   Anesthesia type: Spinal   Pre-op diagnosis: left knee osteoarthritis   [facility] / [facility]   Surgeons: Wes Hamman, MD       DISCUSSION: Patient is a 63 year old female scheduled for the above procedure .  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  She had recent cardiology evaluation by Dr. Junnie Olives on 12/06/23. She had previously been followed at [facility] dating back to 2020 for tachycardia and palpitations. TTE in 10/2018 showed LVEF 55-60%, mild LVH, mild TR/PR, trivial MR. Cardiac monitor 3/202 showed rare PACs, PVCs, no significant arrhythmias. ETT 03/2022 was non-ischemic. She denied any recent palpitations. On amlodipine  for HTN. Advised starting Lipitor for HLD. In regards to surgery, "Preprocedural exam, left knee surgery being planned. Previous workup with ETT and echocardiogram showed no significant structural abnormalities. Patient clinically asymptomatic. Okay to proceed with surgical procedure from a cardiac perspective."  Anesthesia team to evaluate on the day of surgery.    VS: BP 109/87   Pulse 88   Temp 36.6 C   Resp 18   Ht 5\' 3"  (1.6 m)   Wt 97.8 kg   SpO2 99%   BMI 38.17 kg/m    PROVIDERS: [place], Manley Seeds, MD is PCP Constancia Delton, MD is cardiologist [place], Adolm Ahumada, PA-C is [facility] provider    LABS: Labs reviewed: Acceptable for surgery. A1c 6.1% on 06/21/23.  (all labs ordered are listed, but  only abnormal results are displayed)  Labs Reviewed  CBC - Abnormal; Notable for the following components:      Result Value   RBC 5.38 (*)    MCV 79.9 (*)    MCH 25.7 (*)    All other components within normal limits  SURGICAL PCR SCREEN  BASIC METABOLIC PANEL WITH GFR    IMAGES: MRI Left Knee 08/15/23: IMPRESSION: 1. Radial tear of the posterior horn medial meniscus with peripheral meniscal extrusion and degeneration of the body. 2. Tricompartmental cartilage abnormalities as described above most severe in the medial femorotibial compartment.    EKG: 12/06/23: Normal sinus rhythm Nonspecific T wave abnormality Confirmed by Constancia Delton ([id number]) on 12/06/2023 9:48:34 AM   CV: ETT 03/12/22: As outlined by cardiologist Percival Brace, MD on 04/02/22 Office Visit: "She underwent ETT 03/12/2022, exercised 3 minutes on the Bruce protocol without chest pain or ischemic ECG changes."   Echo 10/28/18 ([facility]): INTERPRETATION  NORMAL LEFT VENTRICULAR SYSTOLIC FUNCTION   WITH MILD LVH  NORMAL RIGHT VENTRICULAR SYSTOLIC FUNCTION  MILD VALVULAR REGURGITATION (See below)  NO VALVULAR STENOSIS  MILD TR, PR  TRIVIAL MR  EF 55-60%  Closest EF: >55% (Estimated)  LVH: MILD LVH  Mitral: TRIVIAL MR  Tricuspid: MILD TR    Holter Monitor 10/06/18 ([facility]): As outlined by Alphonso Jean, PA at [facility]: "Holter monitor revealed normal sinus rhythm with an average heart rate of 89bpm with rare PVCs and PACs. No runs or pauses."  Past Medical History:  Diagnosis Date   Allergy    pollen   Anxiety    Bursitis, trochanteric    bi/lat  Dental crowns present    x 4   Diverticulosis    Fatty liver    GERD (gastroesophageal reflux disease)    Hyperlipidemia    no current med.   Hypertension    white coat syndrome,   Mass of breast, left 01/2012   Obesity    Palpitations    PONV (postoperative nausea and vomiting)    Pre-diabetes    Pre-diabetes    Vitamin D   deficiency     Past Surgical History:  Procedure Laterality Date   ABDOMINAL HYSTERECTOMY     partial   ANTERIOR AND POSTERIOR REPAIR WITH SACROSPINOUS FIXATION N/A 08/20/2022   Procedure: POSTERIOR REPAIR WITH PERINEORRHAPHY AND WITH  SACROSPINOUS FIXATION;  Surgeon: Arma Lamp, MD;  Location: [facility];  Service: Gynecology;  Laterality: N/A;   BREAST BIOPSY Right 08/04/2021   BREAST EXCISIONAL BIOPSY Left 2013   BREAST SURGERY  01/10/2012   lumpectomy - left (excisional biopsy benign)   CHOLECYSTECTOMY N/A 02/10/2016   Procedure: LAPAROSCOPIC CHOLECYSTECTOMY WITH INTRAOPERATIVE CHOLANGIOGRAM;  Surgeon: Lillette Reid III, MD;  Location: [facility];  Service: General;  Laterality: N/A;   COLONOSCOPY  04/06/2010   D Brodie, normal w/tics   RECTOCELE REPAIR  06/14/2011   cystocele repair, pt states Dr Manuel Sell did not do rectocele and said she had to come back later.   ROBOTIC ASSISTED LAPAROSCOPIC SACROCOLPOPEXY  06/14/2011   Procedure: ROBOTIC ASSISTED LAPAROSCOPIC SACROCOLPOPEXY;  Surgeon: Kristeen Peto, MD;  Location: [facility];  Service: Urology;  Laterality: N/A;   TUBAL LIGATION      MEDICATIONS:  docusate sodium  (COLACE) 100 MG capsule   methocarbamol  (ROBAXIN ) 750 MG tablet   ondansetron  (ZOFRAN ) 4 MG tablet   oxyCODONE -acetaminophen  (PERCOCET) 5-325 MG tablet   acetaminophen  (TYLENOL ) 500 MG tablet   acidophilus (RISAQUAD) CAPS capsule   amLODipine  (NORVASC ) 2.5 MG tablet   atorvastatin  (LIPITOR) 20 MG tablet   b complex vitamins capsule   esomeprazole  (NEXIUM ) 20 MG capsule   estradiol  (ESTRACE ) 0.1 MG/GM vaginal cream   fluticasone  (FLONASE ) 50 MCG/ACT nasal spray   ibuprofen  (ADVIL ) 800 MG tablet   loratadine (CLARITIN) 10 MG tablet   metFORMIN  (GLUCOPHAGE ) 500 MG tablet   sertraline  (ZOLOFT ) 25 MG tablet   Vitamin D , Cholecalciferol, 25 MCG (1000 UT) TABS   No current facility-administered medications for this encounter.    Ella Gun,  PA-C Surgical Short Stay/Anesthesiology [facility] Phone [phone] [facility] Phone [phone] 12/31/2023 2:06 PM

## 2024-01-06 ENCOUNTER — Ambulatory Visit (HOSPITAL_COMMUNITY): Payer: Self-pay | Admitting: Vascular Surgery

## 2024-01-06 ENCOUNTER — Encounter (HOSPITAL_COMMUNITY)
Admission: RE | Disposition: A | Discharge status: Home / Self Care | Payer: Self-pay | Source: Home / Self Care | Attending: Orthopaedic Surgery

## 2024-01-06 ENCOUNTER — Observation Stay (HOSPITAL_COMMUNITY)

## 2024-01-06 ENCOUNTER — Other Ambulatory Visit (HOSPITAL_COMMUNITY): Payer: Self-pay

## 2024-01-06 ENCOUNTER — Encounter (HOSPITAL_COMMUNITY): Payer: Self-pay | Admitting: Orthopaedic Surgery

## 2024-01-06 ENCOUNTER — Other Ambulatory Visit: Payer: Self-pay | Admitting: Physician Assistant

## 2024-01-06 ENCOUNTER — Observation Stay (HOSPITAL_COMMUNITY)
Admission: RE | Admit: 2024-01-06 | Discharge: 2024-01-07 | Disposition: A | Discharge status: Home / Self Care | Payer: Managed Care, Other (non HMO) | Attending: Orthopaedic Surgery | Admitting: Orthopaedic Surgery

## 2024-01-06 ENCOUNTER — Ambulatory Visit (HOSPITAL_COMMUNITY): Payer: Self-pay | Admitting: Anesthesiology

## 2024-01-06 ENCOUNTER — Other Ambulatory Visit: Payer: Self-pay

## 2024-01-06 DIAGNOSIS — M1712 Unilateral primary osteoarthritis, left knee: Secondary | ICD-10-CM

## 2024-01-06 DIAGNOSIS — Z79899 Other long term (current) drug therapy: Secondary | ICD-10-CM | POA: Diagnosis not present

## 2024-01-06 DIAGNOSIS — I10 Essential (primary) hypertension: Secondary | ICD-10-CM | POA: Insufficient documentation

## 2024-01-06 DIAGNOSIS — Z7984 Long term (current) use of oral hypoglycemic drugs: Secondary | ICD-10-CM | POA: Diagnosis not present

## 2024-01-06 DIAGNOSIS — Z96652 Presence of left artificial knee joint: Secondary | ICD-10-CM

## 2024-01-06 HISTORY — PX: TOTAL KNEE ARTHROPLASTY: SHX125

## 2024-01-06 SURGERY — ARTHROPLASTY, KNEE, TOTAL
Anesthesia: Regional | Site: Knee | Laterality: Left

## 2024-01-06 MED ORDER — CHLORHEXIDINE GLUCONATE 0.12 % MT SOLN
15.0000 mL | Freq: Once | OROMUCOSAL | Status: AC
  Administered 2024-01-06: 15 mL via OROMUCOSAL
  Filled 2024-01-06: qty 15

## 2024-01-06 MED ORDER — PHENYLEPHRINE HCL-NACL 20-0.9 MG/250ML-% IV SOLN
INTRAVENOUS | Status: DC | PRN
  Administered 2024-01-06: 25 ug/min via INTRAVENOUS

## 2024-01-06 MED ORDER — ONDANSETRON HCL 4 MG/2ML IJ SOLN
4.0000 mg | Freq: Four times a day (QID) | INTRAMUSCULAR | Status: DC | PRN
  Administered 2024-01-06: 4 mg via INTRAVENOUS
  Filled 2024-01-06: qty 2

## 2024-01-06 MED ORDER — OXYCODONE HCL 5 MG PO TABS
10.0000 mg | ORAL_TABLET | ORAL | Status: DC | PRN
  Administered 2024-01-07: 10 mg via ORAL
  Filled 2024-01-06: qty 2

## 2024-01-06 MED ORDER — BUPIVACAINE IN DEXTROSE 0.75-8.25 % IT SOLN
INTRATHECAL | Status: DC | PRN
  Administered 2024-01-06: 12 mg via INTRATHECAL

## 2024-01-06 MED ORDER — HYDROMORPHONE HCL 1 MG/ML IJ SOLN
0.5000 mg | INTRAMUSCULAR | Status: DC | PRN
  Administered 2024-01-06: 1 mg via INTRAVENOUS
  Filled 2024-01-06: qty 1

## 2024-01-06 MED ORDER — TRANEXAMIC ACID-NACL 1000-0.7 MG/100ML-% IV SOLN
1000.0000 mg | INTRAVENOUS | Status: AC
  Administered 2024-01-06: 1000 mg via INTRAVENOUS
  Filled 2024-01-06: qty 100

## 2024-01-06 MED ORDER — METHOCARBAMOL 1000 MG/10ML IJ SOLN: 500.0000 mg | Freq: Four times a day (QID) | INTRAMUSCULAR | Status: DC | PRN

## 2024-01-06 MED ORDER — KETOROLAC TROMETHAMINE 15 MG/ML IJ SOLN
7.5000 mg | Freq: Four times a day (QID) | INTRAMUSCULAR | Status: AC
  Administered 2024-01-06 – 2024-01-07 (×4): 7.5 mg via INTRAVENOUS
  Filled 2024-01-06 (×4): qty 1

## 2024-01-06 MED ORDER — APIXABAN 2.5 MG PO TABS
2.5000 mg | ORAL_TABLET | Freq: Two times a day (BID) | ORAL | 0 refills | Status: DC
  Filled 2024-01-06: qty 60, 30d supply, fill #0

## 2024-01-06 MED ORDER — ONDANSETRON HCL 4 MG PO TABS: 4.0000 mg | ORAL_TABLET | Freq: Four times a day (QID) | ORAL | Status: DC | PRN

## 2024-01-06 MED ORDER — BUPIVACAINE-MELOXICAM ER 400-12 MG/14ML IJ SOLN
INTRAMUSCULAR | Status: AC
  Filled 2024-01-06: qty 1

## 2024-01-06 MED ORDER — CEFAZOLIN SODIUM-DEXTROSE 2-4 GM/100ML-% IV SOLN
2.0000 g | INTRAVENOUS | Status: AC
  Administered 2024-01-06: 2 g via INTRAVENOUS
  Filled 2024-01-06: qty 100

## 2024-01-06 MED ORDER — HYDROXYZINE HCL 50 MG/ML IM SOLN: 50.0000 mg | Freq: Four times a day (QID) | INTRAMUSCULAR | Status: DC | PRN

## 2024-01-06 MED ORDER — DEXAMETHASONE SODIUM PHOSPHATE 10 MG/ML IJ SOLN
10.0000 mg | Freq: Once | INTRAMUSCULAR | Status: AC
  Administered 2024-01-07: 10 mg via INTRAVENOUS
  Filled 2024-01-06: qty 1

## 2024-01-06 MED ORDER — ORAL CARE MOUTH RINSE: 15.0000 mL | Freq: Once | OROMUCOSAL | Status: AC

## 2024-01-06 MED ORDER — AMLODIPINE BESYLATE 2.5 MG PO TABS
2.5000 mg | ORAL_TABLET | Freq: Every day | ORAL | Status: DC
  Filled 2024-01-06: qty 1

## 2024-01-06 MED ORDER — MIDAZOLAM HCL 2 MG/2ML IJ SOLN
INTRAMUSCULAR | Status: AC
Start: 2024-01-06 — End: ?
  Filled 2024-01-06: qty 2

## 2024-01-06 MED ORDER — 0.9 % SODIUM CHLORIDE (POUR BTL) OPTIME
TOPICAL | Status: DC | PRN
  Administered 2024-01-06: 1000 mL

## 2024-01-06 MED ORDER — FENTANYL CITRATE (PF) 100 MCG/2ML IJ SOLN: 50.0000 ug | Freq: Once | INTRAMUSCULAR | Status: AC

## 2024-01-06 MED ORDER — VANCOMYCIN HCL 1000 MG IV SOLR
INTRAVENOUS | Status: AC
  Filled 2024-01-06: qty 20

## 2024-01-06 MED ORDER — PROPOFOL 500 MG/50ML IV EMUL
INTRAVENOUS | Status: DC | PRN
  Administered 2024-01-06: 100 ug/kg/min via INTRAVENOUS
  Administered 2024-01-06: 25 mg via INTRAVENOUS

## 2024-01-06 MED ORDER — ACETAMINOPHEN 500 MG PO TABS
1000.0000 mg | ORAL_TABLET | Freq: Four times a day (QID) | ORAL | Status: AC
  Administered 2024-01-06 – 2024-01-07 (×4): 1000 mg via ORAL
  Filled 2024-01-06 (×4): qty 2

## 2024-01-06 MED ORDER — APIXABAN 2.5 MG PO TABS
2.5000 mg | ORAL_TABLET | Freq: Two times a day (BID) | ORAL | Status: DC
  Administered 2024-01-07: 2.5 mg via ORAL
  Filled 2024-01-06: qty 1

## 2024-01-06 MED ORDER — MIDAZOLAM HCL 2 MG/2ML IJ SOLN: 1.0000 mg | Freq: Once | INTRAMUSCULAR | Status: AC

## 2024-01-06 MED ORDER — TRANEXAMIC ACID-NACL 1000-0.7 MG/100ML-% IV SOLN
1000.0000 mg | Freq: Once | INTRAVENOUS | Status: AC
  Administered 2024-01-06: 1000 mg via INTRAVENOUS
  Filled 2024-01-06: qty 100

## 2024-01-06 MED ORDER — OXYCODONE HCL ER 10 MG PO T12A
10.0000 mg | EXTENDED_RELEASE_TABLET | Freq: Two times a day (BID) | ORAL | Status: DC
  Administered 2024-01-06 – 2024-01-07 (×3): 10 mg via ORAL
  Filled 2024-01-06 (×3): qty 1

## 2024-01-06 MED ORDER — FENTANYL CITRATE (PF) 100 MCG/2ML IJ SOLN
INTRAMUSCULAR | Status: AC
  Administered 2024-01-06: 50 ug via INTRAVENOUS
  Filled 2024-01-06: qty 2

## 2024-01-06 MED ORDER — METHOCARBAMOL 500 MG PO TABS
500.0000 mg | ORAL_TABLET | Freq: Four times a day (QID) | ORAL | Status: DC | PRN
  Administered 2024-01-06 – 2024-01-07 (×3): 500 mg via ORAL
  Filled 2024-01-06 (×3): qty 1

## 2024-01-06 MED ORDER — TRANEXAMIC ACID 1000 MG/10ML IV SOLN
2000.0000 mg | INTRAVENOUS | Status: AC
  Administered 2024-01-06: 2000 mg via TOPICAL
  Filled 2024-01-06: qty 20

## 2024-01-06 MED ORDER — PHENOL 1.4 % MT LIQD: 1.0000 | OROMUCOSAL | Status: DC | PRN

## 2024-01-06 MED ORDER — FENTANYL CITRATE (PF) 250 MCG/5ML IJ SOLN
INTRAMUSCULAR | Status: DC | PRN
Start: 2024-01-06 — End: 2024-01-06
  Administered 2024-01-06 (×2): 50 ug via INTRAVENOUS

## 2024-01-06 MED ORDER — POVIDONE-IODINE 10 % EX SWAB
2.0000 | Freq: Once | CUTANEOUS | Status: AC
  Administered 2024-01-06: 2 via TOPICAL

## 2024-01-06 MED ORDER — MENTHOL 3 MG MT LOZG: 1.0000 | LOZENGE | OROMUCOSAL | Status: DC | PRN

## 2024-01-06 MED ORDER — CLONIDINE HCL (ANALGESIA) 100 MCG/ML EP SOLN
EPIDURAL | Status: DC | PRN
  Administered 2024-01-06: 100 ug

## 2024-01-06 MED ORDER — DOCUSATE SODIUM 100 MG PO CAPS
100.0000 mg | ORAL_CAPSULE | Freq: Two times a day (BID) | ORAL | Status: DC
  Administered 2024-01-06 – 2024-01-07 (×3): 100 mg via ORAL
  Filled 2024-01-06 (×3): qty 1

## 2024-01-06 MED ORDER — METOCLOPRAMIDE HCL 5 MG PO TABS: 5.0000 mg | ORAL_TABLET | Freq: Three times a day (TID) | ORAL | Status: DC | PRN

## 2024-01-06 MED ORDER — ACETAMINOPHEN 325 MG PO TABS: 325.0000 mg | ORAL_TABLET | Freq: Four times a day (QID) | ORAL | Status: DC | PRN

## 2024-01-06 MED ORDER — SODIUM CHLORIDE 0.9 % IR SOLN
Status: DC | PRN
  Administered 2024-01-06: 1000 mL

## 2024-01-06 MED ORDER — MIDAZOLAM HCL 2 MG/2ML IJ SOLN
INTRAMUSCULAR | Status: DC | PRN
  Administered 2024-01-06 (×2): 1 mg via INTRAVENOUS

## 2024-01-06 MED ORDER — FENTANYL CITRATE (PF) 250 MCG/5ML IJ SOLN
INTRAMUSCULAR | Status: AC
  Filled 2024-01-06: qty 5

## 2024-01-06 MED ORDER — METOCLOPRAMIDE HCL 5 MG/ML IJ SOLN: 5.0000 mg | Freq: Three times a day (TID) | INTRAMUSCULAR | Status: DC | PRN

## 2024-01-06 MED ORDER — LACTATED RINGERS IV SOLN: INTRAVENOUS | Status: DC

## 2024-01-06 MED ORDER — OXYCODONE HCL 5 MG PO TABS
5.0000 mg | ORAL_TABLET | ORAL | Status: DC | PRN
  Administered 2024-01-06 – 2024-01-07 (×2): 10 mg via ORAL
  Filled 2024-01-06 (×2): qty 2

## 2024-01-06 MED ORDER — CEFAZOLIN SODIUM-DEXTROSE 2-4 GM/100ML-% IV SOLN
2.0000 g | Freq: Four times a day (QID) | INTRAVENOUS | Status: AC
  Administered 2024-01-06 (×2): 2 g via INTRAVENOUS
  Filled 2024-01-06 (×2): qty 100

## 2024-01-06 MED ORDER — BUPIVACAINE-MELOXICAM ER 400-12 MG/14ML IJ SOLN
INTRAMUSCULAR | Status: DC | PRN
Start: 2024-01-06 — End: 2024-01-06
  Administered 2024-01-06: 400 mg

## 2024-01-06 MED ORDER — MIDAZOLAM HCL 2 MG/2ML IJ SOLN
INTRAMUSCULAR | Status: AC
  Administered 2024-01-06: 1 mg via INTRAVENOUS
  Filled 2024-01-06: qty 2

## 2024-01-06 MED ORDER — VANCOMYCIN HCL 1000 MG IV SOLR
INTRAVENOUS | Status: DC | PRN
  Administered 2024-01-06: 1000 mg

## 2024-01-06 MED ORDER — SERTRALINE HCL 25 MG PO TABS
25.0000 mg | ORAL_TABLET | Freq: Every day | ORAL | Status: DC
  Administered 2024-01-07: 25 mg via ORAL
  Filled 2024-01-06: qty 1

## 2024-01-06 MED ORDER — PRONTOSAN WOUND IRRIGATION OPTIME
TOPICAL | Status: DC | PRN
  Administered 2024-01-06: 350 mL

## 2024-01-06 MED ORDER — ROPIVACAINE HCL 5 MG/ML IJ SOLN
INTRAMUSCULAR | Status: DC | PRN
  Administered 2024-01-06: 30 mL via PERINEURAL

## 2024-01-06 SURGICAL SUPPLY — 74 items
ALCOHOL 70% 16 OZ (MISCELLANEOUS) ×1 IMPLANT
BAG COUNTER SPONGE SURGICOUNT (BAG) IMPLANT
BAG DECANTER FOR FLEXI CONT (MISCELLANEOUS) ×1 IMPLANT
BANDAGE ESMARK 6X9 LF (GAUZE/BANDAGES/DRESSINGS) IMPLANT
BLADE SAG 18X100X1.27 (BLADE) ×1 IMPLANT
BLADE SAW SAG 90X13X1.27 (BLADE) IMPLANT
BLADE SAW SGTL 73X25 THK (BLADE) ×1 IMPLANT
BOWL SMART MIX CTS (DISPOSABLE) ×1 IMPLANT
CLSR STERI-STRIP ANTIMIC 1/2X4 (GAUZE/BANDAGES/DRESSINGS) ×2 IMPLANT
COMPONENT FEM KNEE PS STD 6 LT (Joint) IMPLANT
COMPONENT PATELLA 3 PEG 29X9 (Joint) IMPLANT
COOLER ICEMAN CLASSIC (MISCELLANEOUS) ×1 IMPLANT
COVER SURGICAL LIGHT HANDLE (MISCELLANEOUS) ×1 IMPLANT
CUFF TOURN SGL QUICK 42 (TOURNIQUET CUFF) IMPLANT
CUFF TRNQT CYL 34X4.125X (TOURNIQUET CUFF) ×1 IMPLANT
DERMABOND ADVANCED .7 DNX12 (GAUZE/BANDAGES/DRESSINGS) ×1 IMPLANT
DRAPE EXTREMITY T 121X128X90 (DISPOSABLE) ×1 IMPLANT
DRAPE HALF SHEET 40X57 (DRAPES) ×1 IMPLANT
DRAPE INCISE IOBAN 66X45 STRL (DRAPES) ×1 IMPLANT
DRAPE POUCH INSTRU U-SHP 10X18 (DRAPES) ×1 IMPLANT
DRAPE SURG ORHT 6 SPLT 77X108 (DRAPES) IMPLANT
DRAPE U-SHAPE 47X51 STRL (DRAPES) ×2 IMPLANT
DRSG AQUACEL AG ADV 3.5X 6 (GAUZE/BANDAGES/DRESSINGS) IMPLANT
DRSG AQUACEL AG ADV 3.5X10 (GAUZE/BANDAGES/DRESSINGS) ×1 IMPLANT
DURAPREP 26ML APPLICATOR (WOUND CARE) ×3 IMPLANT
ELECT CAUTERY BLADE 6.4 (BLADE) ×1 IMPLANT
ELECT PENCIL ROCKER SW 15FT (MISCELLANEOUS) ×1 IMPLANT
ELECTRODE REM PT RTRN 9FT ADLT (ELECTROSURGICAL) ×1 IMPLANT
GLOVE BIOGEL PI IND STRL 7.0 (GLOVE) ×2 IMPLANT
GLOVE BIOGEL PI IND STRL 7.5 (GLOVE) ×5 IMPLANT
GLOVE ECLIPSE 7.0 STRL STRAW (GLOVE) ×3 IMPLANT
GLOVE INDICATOR 7.0 STRL GRN (GLOVE) ×1 IMPLANT
GLOVE INDICATOR 7.5 STRL GRN (GLOVE) ×1 IMPLANT
GLOVE SURG SYN 7.5 E (GLOVE) ×2 IMPLANT
GLOVE SURG SYN 7.5 PF PI (GLOVE) ×2 IMPLANT
GLOVE SURG UNDER LTX SZ7.5 (GLOVE) ×2 IMPLANT
GLOVE SURG UNDER POLY LF SZ7 (GLOVE) ×2 IMPLANT
GOWN STRL REUS W/ TWL LRG LVL3 (GOWN DISPOSABLE) ×1 IMPLANT
GOWN STRL SURGICAL XL XLNG (GOWN DISPOSABLE) ×1 IMPLANT
GOWN TOGA ZIPPER T7+ PEEL AWAY (MISCELLANEOUS) ×2 IMPLANT
HOOD PEEL AWAY T7 (MISCELLANEOUS) ×1 IMPLANT
IMMOBILIZER KNEE 22 UNIV (SOFTGOODS) IMPLANT
INSERT ARTISURF SZ 6-7 LT (Insert) IMPLANT
KIT BASIN OR (CUSTOM PROCEDURE TRAY) ×1 IMPLANT
KIT TURNOVER KIT B (KITS) ×1 IMPLANT
MANIFOLD NEPTUNE II (INSTRUMENTS) ×1 IMPLANT
MARKER SKIN DUAL TIP RULER LAB (MISCELLANEOUS) ×2 IMPLANT
NDL SPNL 18GX3.5 QUINCKE PK (NEEDLE) ×1 IMPLANT
NEEDLE SPNL 18GX3.5 QUINCKE PK (NEEDLE) ×1 IMPLANT
NS IRRIG 1000ML POUR BTL (IV SOLUTION) ×1 IMPLANT
PACK TOTAL JOINT (CUSTOM PROCEDURE TRAY) ×1 IMPLANT
PAD ARMBOARD POSITIONER FOAM (MISCELLANEOUS) ×2 IMPLANT
PAD COLD SHLDR WRAP-ON (PAD) ×1 IMPLANT
PIN DRILL HDLS TROCAR 75 4PK (PIN) IMPLANT
SCREW FEMALE HEX FIX 25X2.5 (ORTHOPEDIC DISPOSABLE SUPPLIES) IMPLANT
SET HNDPC FAN SPRY TIP SCT (DISPOSABLE) ×1 IMPLANT
SOLUTION PRONTOSAN WOUND 350ML (IRRIGATION / IRRIGATOR) ×1 IMPLANT
STAPLER SKIN PROX 35W (STAPLE) IMPLANT
STEM TIB PS KNEE D 0D LT (Stem) IMPLANT
SUCTION TUBE FRAZIER 10FR DISP (SUCTIONS) ×1 IMPLANT
SUT ETHILON 2 0 FS 18 (SUTURE) IMPLANT
SUT ETHILON 2 0 PSLX (SUTURE) IMPLANT
SUT MNCRL AB 3-0 PS2 27 (SUTURE) IMPLANT
SUT STRATAFIX 1PDS 45CM VIOLET (SUTURE) IMPLANT
SUT VIC AB 0 CT1 27XBRD ANBCTR (SUTURE) ×2 IMPLANT
SUT VIC AB 1 CTX 27 (SUTURE) ×3 IMPLANT
SUT VIC AB 2-0 CT1 TAPERPNT 27 (SUTURE) ×4 IMPLANT
SYR 50ML LL SCALE MARK (SYRINGE) ×2 IMPLANT
TOWEL GREEN STERILE (TOWEL DISPOSABLE) ×1 IMPLANT
TOWEL GREEN STERILE FF (TOWEL DISPOSABLE) ×1 IMPLANT
TRAY CATH INTERMITTENT SS 16FR (CATHETERS) IMPLANT
TUBE SUCT ARGYLE STRL (TUBING) ×1 IMPLANT
UNDERPAD 30X36 HEAVY ABSORB (UNDERPADS AND DIAPERS) ×1 IMPLANT
YANKAUER SUCT BULB TIP NO VENT (SUCTIONS) ×2 IMPLANT

## 2024-01-06 NOTE — Transfer of Care (Signed)
 Immediate Anesthesia Transfer of Care Note  Patient: Makayla [name]  Procedure(s) Performed: LEFT TOTAL KNEE ARTHROPLASTY (Left: Knee)  Patient Location: PACU  Anesthesia Type:Spinal and MAC combined with regional for post-op pain  Level of Consciousness: awake, alert , oriented, and patient cooperative  Airway & Oxygen Therapy: Patient Spontanous Breathing  Post-op Assessment: Report given to RN and Post -op Vital signs reviewed and stable  Post vital signs: Reviewed and stable  Last Vitals:  Vitals Value Taken Time  BP 133/61 01/06/24 1215  Temp    Pulse 118 01/06/24 1216  Resp 21 01/06/24 1216  SpO2 96 % 01/06/24 1216  Vitals shown include unfiled device data.  Last Pain:  Vitals:   01/06/24 0940  TempSrc:   PainSc: 0-No pain         Complications: No notable events documented.

## 2024-01-06 NOTE — Anesthesia Procedure Notes (Addendum)
 Spinal  Patient location during procedure: OR Start time: 01/06/2024 10:32 AM End time: 01/06/2024 10:35 AM Reason for block: surgical anesthesia Staffing Performed: anesthesiologist  Anesthesiologist: Rosalita Combe, MD Performed by: Rosalita Combe, MD Authorized by: Rosalita Combe, MD   Preanesthetic Checklist Completed: patient identified, IV checked, risks and benefits discussed, surgical consent, monitors and equipment checked, pre-op evaluation and timeout performed Spinal Block Patient position: sitting Prep: DuraPrep and site prepped and draped Patient monitoring: heart rate, cardiac monitor, continuous pulse ox and blood pressure Approach: midline Location: L3-4 Injection technique: single-shot Needle Needle type: Pencan  Needle gauge: 24 G Needle length: 10 cm Needle insertion depth: 6 cm Assessment Sensory level: T4 Events: CSF return Additional Notes  1 Attempt (s). Pt tolerated procedure well.

## 2024-01-06 NOTE — Op Note (Signed)
 Total Knee Arthroplasty Procedure Note  Preoperative diagnosis: Left knee osteoarthritis  Postoperative diagnosis:same  Operative findings: Complete loss of joint space from medial and patellofemoral compartments Excellent bone quality  Operative procedure: Left total knee arthroplasty. CPT [phone]  Surgeon: N. Claria Crofts, MD  Assist: Sharran Decent, PA-C; necessary for the timely completion of procedure and due to complexity of procedure.  Anesthesia: Spinal, regional  Tourniquet time: see anesthesia record  Implants used: Zimmer persona press fit Femur: CR 6 Tibia: D Patella: 29 mm Polyethylene: 10 mm medial congruent  Indication: Makayla [name] is a 63 y.o. year old female with a history of knee pain. Having failed conservative management, the patient elected to proceed with a total knee arthroplasty.  We have reviewed the risk and benefits of the surgery and they elected to proceed after voicing understanding.  Procedure:  After informed consent was obtained and understanding of the risk were voiced including but not limited to bleeding, infection, damage to surrounding structures including nerves and vessels, blood clots, leg length inequality and the failure to achieve desired results, the operative extremity was marked with verbal confirmation of the patient in the holding area.   The patient was then brought to the operating room and transported to the operating room table in the supine position.  A tourniquet was applied to the operative extremity around the upper thigh. The operative limb was then prepped and draped in the usual sterile fashion and preoperative antibiotics were administered.  A time out was performed prior to the start of surgery confirming the correct extremity, preoperative antibiotic administration, as well as team members, implants and instruments available for the case. Correct surgical site was also confirmed with preoperative radiographs. The  limb was then elevated for exsanguination and the tourniquet was inflated. A midline incision was made and a standard medial parapatellar approach was performed.  The patella was everted which showed complete loss of articular cartilage.  The patella was resected to 12 mm and sized to a 29 mm.  A cover was placed on the patella for protection from retractors.  We then turned our attention to the femur.  The ACL was sacrificed. Start site was drilled in the femur and the intramedullary distal femoral cutting guide was placed, set at 5 degrees valgus, taking 10 mm of distal resection. The distal cut was made. Osteophytes were then removed.   Next, the proximal tibial cutting guide was placed with appropriate slope, varus/valgus alignment and depth of resection. A drop rod was attached to confirm that it was pointed to the second metatarsal.  The proximal tibial cut was made taking 4 mm off the low side. Gap blocks were then used to assess the extension gap and alignment, and appropriate soft tissue releases were performed. Attention was turned back to the femur, which was sized using the sizing guide to a size 6. Appropriate rotation of the femoral component was determined using epicondylar axis, Whiteside's line, and assessing the flexion gap under ligament tension. The appropriate size 4-in-1 cutting block was placed and cuts were made.  Posterior femoral osteophytes and uncapped bone were then removed with the curved osteotome.  Trial components were placed, and stability was checked in full extension, mid-flexion, and deep flexion.  The PCL was resected to balance the flexion space.  The patella tracked well without a lateral release. Trial components were then removed and tibial preparation performed.  The tibial bone quality was excellent for press fit.   The tibial  trial was pointed to the medial third of the tibial tubercle.  The tibia was sized for a size D component.  The bony surfaces were irrigated  with a pulse lavage and then dried. Final components were placed.  The stability of the construct was re-evaluated throughout a range of motion and found to be acceptable. The trial liner was removed, the knee was copiously irrigated, and the knee was re-evaluated for any excess bone debris. The real polyethylene liner, 10 mm thick, was inserted and checked to ensure the locking mechanism had engaged appropriately. The tourniquet was deflated and hemostasis was achieved. The wound was irrigated with normal saline.  One gram of vancomycin  powder was placed in the surgical bed.  Topical mixture of 0.25% bupivacaine  and meloxicam was placed in the joint for postoperative pain.  Capsular closure was performed with a #1 statafix in flexion, subcutaneous fat closed with a 0 vicryl suture, then subcutaneous tissue closed with interrupted 2.0 vicryl suture. The skin was then closed with a 2.0 nylon and dermabond. A sterile dressing was applied.  The patient was awakened in the operating room and taken to recovery in stable condition. All sponge, needle, and instrument counts were correct at the end of the case.  Trellis Fries was necessary for opening, closing, retracting, limb positioning and overall facilitation and completion of the surgery.  Position: supine  Complications: none.  Time Out: performed   Drains/Packing: none  Estimated blood loss: minimal  Returned to Recovery Room: in good condition.   Antibiotics: yes   Mechanical VTE (DVT) Prophylaxis: sequential compression devices, TED thigh-high  Chemical VTE (DVT) Prophylaxis: eliquis  Fluid Replacement  Crystalloid: see anesthesia record Blood: none  FFP: none   Specimens Removed: 1 to pathology   Sponge and Instrument Count Correct? yes   PACU: portable radiograph - knee AP and Lateral   Plan/RTC: Return in 2 weeks for wound check.   Weight Bearing/Load Lower Extremity: full   Implant Name Type Inv. Item Serial No.  Manufacturer Lot No. LRB No. Used Action  INSERT ARTISURF SZ 6-7 LT - [id number] Insert INSERT ARTISURF SZ 6-7 LT  [employer](ORTH,TRAU,BIO,SG) [id number] Left 1 Implanted  COMPONENT PATELLA 3 PEG 29X9 - [id number] Joint COMPONENT PATELLA 3 PEG 29X9  [employer]ON(ORTH,TRAU,BIO,SG) [id number] Left 1 Implanted  COMPONENT FEM KNEE PS STD 6 LT - [id number] Joint COMPONENT FEM KNEE PS STD 6 LT  [employer]IMMER [employer]ON(ORTH,TRAU,BIO,SG) [id number] Left 1 Implanted  STEM TIB PS KNEE D 0D LT - [id number] Stem STEM TIB PS KNEE D 0D LT  [employer]MER [employer]ON(ORTH,TRAU,BIO,SG) [id number] Left 1 Implanted    N. Claria Crofts, MD [facility] 11:37 AM

## 2024-01-06 NOTE — Anesthesia Procedure Notes (Signed)
 Anesthesia Regional Block: Adductor canal block   Pre-Anesthetic Checklist: , timeout performed,  Correct Patient, Correct Site, Correct Laterality,  Correct Procedure, Correct Position, site marked,  Risks and benefits discussed,  Surgical consent,  Pre-op evaluation,  At surgeon's request and post-op pain management  Laterality: Lower and Left  Prep: chloraprep       Needles:  Injection technique: Single-shot  Needle Type: Echogenic Needle     Needle Length: 9cm  Needle Gauge: 22     Additional Needles:   Procedures:,,,, ultrasound used (permanent image in chart),,    Narrative:  Start time: 01/06/2024 9:36 AM End time: 01/06/2024 9:42 AM Injection made incrementally with aspirations every 5 mL.  Performed by: Personally  Anesthesiologist: Rosalita Combe, MD  Additional Notes: Block assessed prior to surgery. Pt tolerated procedure well.

## 2024-01-06 NOTE — Anesthesia Postprocedure Evaluation (Signed)
 Anesthesia Post Note  Patient: Makayla [name]  Procedure(s) Performed: LEFT TOTAL KNEE ARTHROPLASTY (Left: Knee)     Patient location during evaluation: Nursing Unit Anesthesia Type: Regional and Spinal Level of consciousness: awake and alert Pain management: pain level controlled Vital Signs Assessment: post-procedure vital signs reviewed and stable Respiratory status: spontaneous breathing, nonlabored ventilation, respiratory function stable and patient connected to nasal cannula oxygen Cardiovascular status: blood pressure returned to baseline and stable Postop Assessment: no apparent nausea or vomiting Anesthetic complications: no  No notable events documented.  Last Vitals:  Vitals:   01/06/24 1245 01/06/24 1304  BP: 123/61 122/78  Pulse: 90 85  Resp: 19 18  Temp: 36.6 C 36.6 C  SpO2: 97% 95%    Last Pain:  Vitals:   01/06/24 1331  TempSrc:   PainSc: 0-No pain                 Rosalita Combe

## 2024-01-06 NOTE — Discharge Instructions (Addendum)

## 2024-01-06 NOTE — Anesthesia Procedure Notes (Signed)
 Procedure Name: MAC Date/Time: 01/06/2024 10:36 AM  Performed by: Colen Daunt, CRNAPre-anesthesia Checklist: Patient identified, Emergency Drugs available, Suction available and Patient being monitored Patient Re-evaluated:Patient Re-evaluated prior to induction Oxygen Delivery Method: Simple face mask Induction Type: IV induction Placement Confirmation: positive ETCO2 Dental Injury: Teeth and Oropharynx as per pre-operative assessment

## 2024-01-06 NOTE — Evaluation (Signed)
 Physical Therapy Evaluation Patient Details Name: Makayla [name] MRN: [id number] DOB: [birth year] Today's Date: 01/06/2024  History of Present Illness  Pt is a 63 y.o. female who presents on 01/06/24 for elective left total knee arthroplasty. PMH is significant for HLD, HTN, and GERD.  Clinical Impression  Prior to admittance, pt is independent for all mobilization and ADLs. Pt presents to evaluation with decreased mobility, decreased strength, decreased activity tolerance, impaired balance, and pain, all limiting patient's ability to mobilize near baseline level of function. Pt was able to ambulate with an AD and no physical assistance. Pt was given total knee exercise handout and educated on contents. Pt was also educated on importance of mobilizing frequently following discharge. Pt would benefit from further gait and stair training. PT will continue to treat patient while she is admitted.         If plan is discharge home, recommend the following: A little help with walking and/or transfers;A little help with bathing/dressing/bathroom;Assist for transportation;Help with stairs or ramp for entrance   Can travel by private vehicle        Equipment Recommendations Rolling walker (2 wheels)  Recommendations for Other Services       Functional Status Assessment Patient has had a recent decline in their functional status and demonstrates the ability to make significant improvements in function in a reasonable and predictable amount of time.     Precautions / Restrictions Precautions Precautions: Fall Recall of Precautions/Restrictions: Intact Restrictions Weight Bearing Restrictions Per Provider Order: Yes LLE Weight Bearing Per Provider Order: Weight bearing as tolerated      Mobility  Bed Mobility Overal bed mobility: Needs Assistance Bed Mobility: Supine to Sit     Supine to sit: HOB elevated, Used rails, Contact guard     General bed mobility comments: Pt completed sup>sit on R  side of bed with CGA. VC given for sequencing; increased time to complete.    Transfers Overall transfer level: Needs assistance Equipment used: Standard walker Transfers: Sit to/from Stand Sit to Stand: Contact guard assist           General transfer comment: Pt completed multiple sit to stands throughout session from surfaces of various heights with RW and CGA. VC given for sequencing; increased time to complete.    Ambulation/Gait Ambulation/Gait assistance: Contact guard assist, Supervision Gait Distance (Feet): 125 Feet Assistive device: Rolling walker (2 wheels) Gait Pattern/deviations: Step-to pattern, Decreased step length - left, Decreased stance time - left, Decreased stride length, Trunk flexed Gait velocity: reduced Gait velocity interpretation: <1.8 ft/sec, indicate of risk for recurrent falls   General Gait Details: Pt ambulated 25' and 100' w/ RW and CGA. Pt began ambulation demonstrating step-to gait and progress to a more reciprocal gait pattern. Pt ambulates with decreased stance time on LLE and decreased LLE knee flexion during swing phase of gait.  Stairs            Wheelchair Mobility     Tilt Bed    Modified Rankin (Stroke Patients Only)       Balance Overall balance assessment: Needs assistance Sitting-balance support: No upper extremity supported, Feet supported Sitting balance-Leahy Scale: Good     Standing balance support: During functional activity, No upper extremity supported (Pt was able to stand at bedside sink for ~45 seconds without upper extremity support.) Standing balance-Leahy Scale: Fair  Pertinent Vitals/Pain Pain Assessment Pain Assessment: 0-10 Pain Score: 2  Pain Location: L knee Pain Descriptors / Indicators: Burning, Discomfort, Grimacing, Tightness Pain Intervention(s): Limited activity within patient's tolerance, Monitored during session, Premedicated before session     Home Living Family/patient expects to be discharged to:: Private residence Living Arrangements: Alone Available Help at Discharge: Family;Available 24 hours/day (daughter) Type of Home: House Home Access: Stairs to enter Entrance Stairs-Rails: Right Entrance Stairs-Number of Steps: 5   Home Layout: One level Home Equipment: BSC/3in1      Prior Function Prior Level of Function : Independent/Modified Independent             Mobility Comments: Pt reports independent with all mobility, not utilizing an AD for ambulation. ADLs Comments: Pt reports independent with ADLs     Extremity/Trunk Assessment   Upper Extremity Assessment Upper Extremity Assessment: Overall WFL for tasks assessed    Lower Extremity Assessment Lower Extremity Assessment: LLE deficits/detail LLE: Unable to fully assess due to pain LLE Sensation: WNL    Cervical / Trunk Assessment Cervical / Trunk Assessment: Kyphotic  Communication   Communication Communication: No apparent difficulties    Cognition Arousal: Alert Behavior During Therapy: WFL for tasks assessed/performed   PT - Cognitive impairments: No apparent impairments                         Following commands: Intact       Cueing Cueing Techniques: Verbal cues, Tactile cues, Visual cues     General Comments General comments (skin integrity, edema, etc.): no signs of acute distress    Exercises     Assessment/Plan    PT Assessment Patient needs continued PT services  PT Problem List Decreased strength;Decreased range of motion;Decreased activity tolerance;Decreased balance;Decreased mobility;Decreased coordination;Pain       PT Treatment Interventions DME instruction;Gait training;Stair training;Functional mobility training;Therapeutic activities;Therapeutic exercise;Balance training;Neuromuscular re-education;Patient/family education;Manual techniques    PT Goals (Current goals can be found in the Care Plan  section)  Acute Rehab PT Goals Patient Stated Goal: to be able to go home PT Goal Formulation: With patient/family Time For Goal Achievement: 01/10/24 Potential to Achieve Goals: Good    Frequency 7X/week     Co-evaluation               AM-PAC PT "6 Clicks" Mobility  Outcome Measure Help needed turning from your back to your side while in a flat bed without using bedrails?: None Help needed moving from lying on your back to sitting on the side of a flat bed without using bedrails?: A Little Help needed moving to and from a bed to a chair (including a wheelchair)?: A Little Help needed standing up from a chair using your arms (e.g., wheelchair or bedside chair)?: A Little Help needed to walk in hospital room?: A Little Help needed climbing 3-5 steps with a railing? : A Lot 6 Click Score: 18    End of Session Equipment Utilized During Treatment: Gait belt Activity Tolerance: Patient tolerated treatment well Patient left: in bed;with call bell/phone within reach;with bed alarm set;with family/visitor present Nurse Communication: Mobility status PT Visit Diagnosis: Unsteadiness on feet (R26.81);Muscle weakness (generalized) (M62.81);Pain Pain - Right/Left: Left Pain - part of body: Knee    Time: 1610-9604 PT Time Calculation (min) (ACUTE ONLY): 26 min   Charges:   PT Evaluation $PT Eval Low Complexity: 1 Low   PT General Charges $$ ACUTE PT VISIT: 1 Visit  [facility] [place] [facility] [phone]   Makayla [name] 01/06/2024, 5:16 PM

## 2024-01-06 NOTE — H&P (Signed)
 PREOPERATIVE H&P  Chief Complaint: left knee osteoarthritis  HPI: Makayla [name] is a 63 y.o. female who presents for surgical treatment of left knee osteoarthritis.  She denies any changes in medical history.  Past Surgical History:  Procedure Laterality Date   ABDOMINAL HYSTERECTOMY     partial   ANTERIOR AND POSTERIOR REPAIR WITH SACROSPINOUS FIXATION N/A 08/20/2022   Procedure: POSTERIOR REPAIR WITH PERINEORRHAPHY AND WITH  SACROSPINOUS FIXATION;  Surgeon: Arma Lamp, MD;  Location: [facility];  Service: Gynecology;  Laterality: N/A;   BREAST BIOPSY Right 08/04/2021   BREAST EXCISIONAL BIOPSY Left 2013   BREAST SURGERY  01/10/2012   lumpectomy - left (excisional biopsy benign)   CHOLECYSTECTOMY N/A 02/10/2016   Procedure: LAPAROSCOPIC CHOLECYSTECTOMY WITH INTRAOPERATIVE CHOLANGIOGRAM;  Surgeon: Lillette Reid III, MD;  Location: [facility];  Service: General;  Laterality: N/A;   COLONOSCOPY  04/06/2010   D Brodie, normal w/tics   RECTOCELE REPAIR  06/14/2011   cystocele repair, pt states Dr Manuel Sell did not do rectocele and said she had to come back later.   ROBOTIC ASSISTED LAPAROSCOPIC SACROCOLPOPEXY  06/14/2011   Procedure: ROBOTIC ASSISTED LAPAROSCOPIC SACROCOLPOPEXY;  Surgeon: Kristeen Peto, MD;  Location: [facility];  Service: Urology;  Laterality: N/A;   TUBAL LIGATION     Social History   Socioeconomic History   Marital status: Widowed    Spouse name: Not on file   Number of children: 1   Years of education: Not on file   Highest education level: Associate degree: academic program  Occupational History   Occupation: RN-retired  Tobacco Use   Smoking status: Never    Passive exposure: Past   Smokeless tobacco: Never  Vaping Use   Vaping status: Never Used  Substance and Sexual Activity   Alcohol use: No    Alcohol/week: 0.0 standard drinks of alcohol   Drug use: No   Sexual activity: Not Currently    Birth control/protection: Post-menopausal     Comment: has used estradiol  patch 0.025  in the past, has stopped  Other Topics Concern   Not on file  Social History Narrative   Not on file   Social Drivers of Health   Financial Resource Strain: Low Risk  (08/30/2023)   Overall Financial Resource Strain (CARDIA)    Difficulty of Paying Living Expenses: Not hard at all  Food Insecurity: No Food Insecurity (08/30/2023)   Hunger Vital Sign    Worried About Running Out of Food in the Last Year: Never true    Ran Out of Food in the Last Year: Never true  Transportation Needs: No Transportation Needs (08/30/2023)   PRAPARE - Administrator, Civil Service (Medical): No    Lack of Transportation (Non-Medical): No  Physical Activity: Insufficiently Active (08/30/2023)   Exercise Vital Sign    Days of Exercise per Week: 2 days    Minutes of Exercise per Session: 20 min  Stress: No Stress Concern Present (08/30/2023)   [employer] of Occupational Health - Occupational Stress Questionnaire    Feeling of Stress : Not at all  Social Connections: Moderately Integrated (08/30/2023)   Social Connection and Isolation Panel [NHANES]    Frequency of Communication with Friends and Family: More than three times a week    Frequency of Social Gatherings with Friends and Family: More than three times a week    Attends Religious Services: More than 4 times per year    Active Member of [employer]  or Organizations: Yes    Attends Banker Meetings: More than 4 times per year    Marital Status: Widowed   Family History  Problem Relation Age of Onset   Hypertension Mother    Hyperlipidemia Mother    Diabetes Mother    Breast cancer Mother 18   Uterine cancer Mother    GER disease Father    Depression Sister    Heart disease Brother    Heart attack Maternal Grandmother    Hypertension Maternal Grandmother    Colon cancer Neg Hx    Colon polyps Neg Hx    Esophageal cancer Neg Hx    Rectal cancer Neg Hx    Stomach cancer Neg  Hx    Allergies  Allergen Reactions   Buspar  [Buspirone ] Anxiety    Made her more anxious   Prior to Admission medications   Medication Sig Start Date End Date Taking? Authorizing Provider  acetaminophen  (TYLENOL ) 500 MG tablet Take 1 tablet (500 mg total) by mouth every 6 (six) hours as needed (pain). 08/08/22  Yes Arma Lamp, MD  acidophilus (RISAQUAD) CAPS capsule Take 1 capsule by mouth daily.   Yes [provider]  amLODipine  (NORVASC ) 2.5 MG tablet Take 1 tablet (2.5 mg total) by mouth daily. Patient taking differently: Take 2.5 mg by mouth at bedtime. 06/21/23  Yes Tower, Manley Seeds, MD  b complex vitamins capsule Take 1 capsule by mouth daily.   Yes [provider]  docusate sodium  (COLACE) 100 MG capsule Take 1 capsule (100 mg total) by mouth daily as needed. 12/24/23 12/23/24 Yes Sandie Cross, PA-C  esomeprazole  (NEXIUM ) 20 MG capsule Take 20 mg by mouth 2 (two) times daily before a meal.   Yes [provider]  estradiol  (ESTRACE ) 0.1 MG/GM vaginal cream Place 0.5 g vaginally 2 (two) times a week. Place 0.5g nightly for two weeks then twice a week after 09/13/22  Yes Zuleta, Kaitlin G, NP  ibuprofen  (ADVIL ) 800 MG tablet Take 1 tablet (800 mg total) by mouth daily as needed for moderate pain (pain score 4-6). With a meal 09/02/23  Yes Tower, Manley Seeds, MD  loratadine (CLARITIN) 10 MG tablet Take 10 mg by mouth daily as needed for allergies.   Yes [provider]  metFORMIN  (GLUCOPHAGE ) 500 MG tablet Take 1 tablet (500 mg total) by mouth 2 (two) times daily with a meal. 11/07/23  Yes Rayburn, Evangelina Hilt, PA-C  methocarbamol  (ROBAXIN ) 750 MG tablet Take 1 tablet (750 mg total) by mouth 3 (three) times daily as needed for muscle spasms. 12/24/23   Sandie Cross, PA-C  ondansetron  (ZOFRAN ) 4 MG tablet Take 1 tablet (4 mg total) by mouth every 8 (eight) hours as needed for nausea or vomiting. 12/24/23   Sandie Cross, PA-C   oxyCODONE -acetaminophen  (PERCOCET) 5-325 MG tablet Take 1-2 tablets by mouth every 6 (six) hours as needed. To be taken after surgery 12/24/23   Sandie Cross, PA-C  sertraline  (ZOLOFT ) 25 MG tablet Take 1 tablet (25 mg total) by mouth daily. 06/21/23  Yes Tower, Manley Seeds, MD  Vitamin D , Cholecalciferol, 25 MCG (1000 UT) TABS Take 5,000 Units by mouth daily.   Yes [provider]  atorvastatin  (LIPITOR) 20 MG tablet Take 1 tablet (20 mg total) by mouth daily. Patient not taking: Reported on 12/27/2023 12/06/23 03/05/24  Constancia Delton, MD  fluticasone  (FLONASE ) 50 MCG/ACT nasal spray Place 2 sprays into both nostrils daily. Patient taking differently: Place  2 sprays into both nostrils daily as needed for allergies. 06/21/23   Tower, Manley Seeds, MD     Positive ROS: All other systems have been reviewed and were otherwise negative with the exception of those mentioned in the HPI and as above.  Physical Exam: General: Alert, no acute distress Cardiovascular: No pedal edema Respiratory: No cyanosis, no use of accessory musculature GI: abdomen soft Skin: No lesions in the area of chief complaint Neurologic: Sensation intact distally Psychiatric: Patient is competent for consent with normal mood and affect Lymphatic: no lymphedema  MUSCULOSKELETAL: exam stable  Assessment: left knee osteoarthritis  Plan: Plan for Procedure(s): LEFT TOTAL KNEE ARTHROPLASTY  The risks benefits and alternatives were discussed with the patient including but not limited to the risks of nonoperative treatment, versus surgical intervention including infection, bleeding, nerve injury,  blood clots, cardiopulmonary complications, morbidity, mortality, among others, and they were willing to proceed.   Claria Crofts, MD 01/06/2024 8:54 AM

## 2024-01-06 NOTE — Progress Notes (Signed)
 Orthopedic Tech Progress Note Patient Details:  Makayla [name] 1960-10-09 [id number]  Ortho Devices Type of Ortho Device: Bone foam zero knee Ortho Device/Splint Location: LLE Ortho Device/Splint Interventions: Ordered, Application, Removal   Post Interventions Patient Tolerated: Fair Instructions Provided: Care of device  Kermitt Pedlar 01/06/2024, 2:33 PM

## 2024-01-07 ENCOUNTER — Encounter (HOSPITAL_COMMUNITY): Payer: Self-pay | Admitting: Orthopaedic Surgery

## 2024-01-07 DIAGNOSIS — M1712 Unilateral primary osteoarthritis, left knee: Secondary | ICD-10-CM | POA: Diagnosis not present

## 2024-01-07 MED ORDER — PANTOPRAZOLE SODIUM 20 MG PO TBEC
20.0000 mg | DELAYED_RELEASE_TABLET | Freq: Every day | ORAL | Status: AC
  Administered 2024-01-07: 20 mg via ORAL
  Filled 2024-01-07: qty 1

## 2024-01-07 NOTE — Discharge Summary (Signed)
 Patient ID: Makayla [name] MRN: [id number] DOB/AGE: [birth year] 63 y.o.  Admit date: 01/06/2024 Discharge date: 01/07/2024  Admission Diagnoses:  Principal Problem:   Primary osteoarthritis of left knee Active Problems:   Status post total left knee replacement   Discharge Diagnoses:  Same  Past Medical History:  Diagnosis Date   Allergy    pollen   Anxiety    Bursitis, trochanteric    bi/lat    Dental crowns present    x 4   Diverticulosis    Fatty liver    GERD (gastroesophageal reflux disease)    Hyperlipidemia    no current med.   Hypertension    white coat syndrome,   Mass of breast, left 01/2012   Obesity    Palpitations    PONV (postoperative nausea and vomiting)    Pre-diabetes    Pre-diabetes    Vitamin D  deficiency     Surgeries: Procedure(s): LEFT TOTAL KNEE ARTHROPLASTY on 01/06/2024   Consultants:   Discharged Condition: Improved  Hospital Course: Makayla [name] is an 63 y.o. female who was admitted 01/06/2024 for operative treatment ofPrimary osteoarthritis of left knee. Patient has severe unremitting pain that affects sleep, daily activities, and work/hobbies. After pre-op clearance the patient was taken to the operating room on 01/06/2024 and underwent  Procedure(s): LEFT TOTAL KNEE ARTHROPLASTY.    Patient was given perioperative antibiotics:  Anti-infectives (From admission, onward)    Start     Dose/Rate Route Frequency Ordered Stop   01/06/24 1300  ceFAZolin  (ANCEF ) IVPB 2g/100 mL premix        2 g 200 mL/hr over 30 Minutes Intravenous Every 6 hours 01/06/24 1256 01/06/24 1904   01/06/24 1111  vancomycin  (VANCOCIN ) powder  Status:  Discontinued          As needed 01/06/24 1111 01/06/24 1212   01/06/24 0915  ceFAZolin  (ANCEF ) IVPB 2g/100 mL premix        2 g 200 mL/hr over 30 Minutes Intravenous On call to O.R. 01/06/24 9629 01/06/24 1107        Patient was given sequential compression devices, early ambulation, and chemoprophylaxis to prevent  DVT.  Patient benefited maximally from hospital stay and there were no complications.    Recent vital signs: Patient Vitals for the past 24 hrs:  BP Temp Temp src Pulse Resp SpO2 Height Weight  01/07/24 0749 (!) 135/51 97.8 F (36.6 C) Oral 94 20 97 % -- --  01/07/24 0243 125/72 98.2 F (36.8 C) Oral 77 18 98 % -- --  01/06/24 2249 133/69 97.7 F (36.5 C) Oral 66 17 100 % -- --  01/06/24 1954 123/69 97.7 F (36.5 C) Oral 76 17 98 % -- --  01/06/24 1551 132/75 -- -- 81 19 98 % -- --  01/06/24 1304 122/78 97.8 F (36.6 C) -- 85 18 95 % -- --  01/06/24 1245 123/61 97.8 F (36.6 C) -- 90 19 97 % -- --  01/06/24 1230 129/67 -- -- (!) 101 16 97 % -- --  01/06/24 1215 133/61 98.7 F (37.1 C) -- (!) 118 (!) 21 96 % -- --  01/06/24 0951 (!) 157/83 -- -- (!) 117 16 96 % -- --  01/06/24 0945 (!) 159/82 -- -- (!) 131 15 100 % -- --  01/06/24 0940 (!) 140/82 -- -- (!) 107 17 97 % -- --  01/06/24 0807 (!) 160/85 (!) 97.5 F (36.4 C) Oral (!) 115 20 98 % 5\' 3"  (1.6  m) 96.2 kg     Recent laboratory studies: No results for input(s): "WBC", "HGB", "HCT", "PLT", "NA", "K", "CL", "CO2", "BUN", "CREATININE", "GLUCOSE", "INR", "CALCIUM " in the last 72 hours.  Invalid input(s): "PT", "2"   Discharge Medications:   Allergies as of 01/07/2024       Reactions   Buspar  [buspirone ] Anxiety   Made her more anxious        Medication List     STOP taking these medications    acetaminophen  500 MG tablet Commonly known as: TYLENOL    ibuprofen  800 MG tablet Commonly known as: ADVIL        TAKE these medications    acidophilus Caps capsule Take 1 capsule by mouth daily.   amLODipine  2.5 MG tablet Commonly known as: NORVASC  Take 1 tablet (2.5 mg total) by mouth daily. What changed: when to take this   atorvastatin  20 MG tablet Commonly known as: LIPITOR Take 1 tablet (20 mg total) by mouth daily.   b complex vitamins capsule Take 1 capsule by mouth daily.   docusate sodium  100 MG  capsule Commonly known as: Colace Take 1 capsule (100 mg total) by mouth daily as needed.   Eliquis 2.5 MG Tabs tablet Generic drug: apixaban Take 1 tablet (2.5 mg total) by mouth 2 (two) times daily for 30 days after surgery to prevent blood clots   esomeprazole  20 MG capsule Commonly known as: NEXIUM  Take 20 mg by mouth 2 (two) times daily before a meal.   estradiol  0.1 MG/GM vaginal cream Commonly known as: ESTRACE  Place 0.5 g vaginally 2 (two) times a week. Place 0.5g nightly for two weeks then twice a week after   fluticasone  50 MCG/ACT nasal spray Commonly known as: FLONASE  Place 2 sprays into both nostrils daily.   loratadine 10 MG tablet Commonly known as: CLARITIN Take 10 mg by mouth daily as needed for allergies.   metFORMIN  500 MG tablet Commonly known as: GLUCOPHAGE  Take 1 tablet (500 mg total) by mouth 2 (two) times daily with a meal.   methocarbamol  750 MG tablet Commonly known as: ROBAXIN  Take 1 tablet (750 mg total) by mouth 3 (three) times daily as needed for muscle spasms.   ondansetron  4 MG tablet Commonly known as: Zofran  Take 1 tablet (4 mg total) by mouth every 8 (eight) hours as needed for nausea or vomiting.   oxyCODONE -acetaminophen  5-325 MG tablet Commonly known as: Percocet Take 1-2 tablets by mouth every 6 (six) hours as needed. To be taken after surgery   sertraline  25 MG tablet Commonly known as: ZOLOFT  Take 1 tablet (25 mg total) by mouth daily.   Vitamin D  (Cholecalciferol) 25 MCG (1000 UT) Tabs Take 5,000 Units by mouth daily.               Durable Medical Equipment  (From admission, onward)           Start     Ordered   01/06/24 1250  DME Walker rolling  Once       Question Answer Comment  Walker: With 5 Inch Wheels   Patient needs a walker to treat with the following condition Status post left partial knee replacement      01/06/24 1249   01/06/24 1250  DME 3 n 1  Once        01/06/24 1249   01/06/24 1250  DME  Bedside commode  Once       Question:  Patient needs a bedside commode to treat with  the following condition  Answer:  Status post left partial knee replacement   01/06/24 1249            Diagnostic Studies: DG Knee Left Port Result Date: 01/06/2024 CLINICAL DATA:  Status post left knee replacement EXAM: PORTABLE LEFT KNEE - 1-2 VIEW COMPARISON:  None Available. FINDINGS: Left knee arthroplasty in expected alignment. No periprosthetic lucency or fracture. Recent postsurgical change includes air and edema in the soft tissues and joint space. IMPRESSION: Left knee arthroplasty without immediate postoperative complication. Electronically Signed   By: Chadwick Colonel M.D.   On: 01/06/2024 12:50    Disposition: Discharge disposition: 01-Home or Self Care          Follow-up Information     [facility] Follow up.   Why: [facility] will contact you for the first home visit Contact information: [phone]        Sandie Cross, PA-C. Schedule an appointment as soon as possible for a visit in 2 week(s).   Specialty: Orthopedic Surgery Contact information: [address] [place] [address] [phone]                  Signed: Sandie Cross 01/07/2024, 8:01 AM

## 2024-01-07 NOTE — Progress Notes (Signed)
 Physical Therapy Treatment  Patient Details Name: Makayla [name] MRN: [id number] DOB: 1960/09/23 Today's Date: 01/07/2024   History of Present Illness Pt is a 63 y.o. female who presents on 01/06/24 for elective left total knee arthroplasty. PMH is significant for HLD, HTN, and GERD.    PT Comments  Pt progressing towards physical therapy goals. Was able to perform transfers and ambulation with up to CGA and RW for support. Pt progressed well with gait training and was able to demonstrate a smooth step-through pattern with good heel strike. Pt reports feeling as if the knee is unstable, and notes 1 instance of mild buckle, however no assist was required to recover. Reviewed HEP and pt completed stair training. Pt and daughter educated on car transfer, positioning recommendations, and appropriate activity progression. Will continue to follow.    If plan is discharge home, recommend the following: A little help with walking and/or transfers;A little help with bathing/dressing/bathroom;Assist for transportation;Help with stairs or ramp for entrance   Can travel by private vehicle        Equipment Recommendations  Rolling walker (2 wheels)    Recommendations for Other Services       Precautions / Restrictions Precautions Precautions: Fall;Knee Precaution Booklet Issued: Yes (comment) Recall of Precautions/Restrictions: Intact Precaution/Restrictions Comments: Pt was educated that NO pillow, roll, ice pack should be propped under the knee; Bone foam should be utilized when relaxing. Restrictions Weight Bearing Restrictions Per Provider Order: Yes LLE Weight Bearing Per Provider Order: Weight bearing as tolerated     Mobility  Bed Mobility Overal bed mobility: Modified Independent Bed Mobility: Supine to Sit, Sit to Supine           General bed mobility comments: No assist required. HOB flat however pt has an adjustable bed at home.    Transfers Overall transfer level: Needs  assistance Equipment used: Rolling walker (2 wheels) Transfers: Sit to/from Stand Sit to Stand: Contact guard assist           General transfer comment: Pt demonstrated proper hand placement on seated surface for safety.    Ambulation/Gait Ambulation/Gait assistance: Supervision Gait Distance (Feet): 300 Feet Assistive device: Rolling walker (2 wheels) Gait Pattern/deviations: Decreased step length - left, Decreased stance time - left, Decreased stride length, Trunk flexed, Step-through pattern Gait velocity: Decreased Gait velocity interpretation: <1.31 ft/sec, indicative of household ambulator   General Gait Details: VC's for improved posture, heel strike, and step-through gait pattern. Pt was able to make corrective changes and overall without overt LOB during gait training.   Stairs Stairs: Yes Stairs assistance: Contact guard assist Stair Management: One rail Right, Step to pattern, Forwards Number of Stairs: 5 General stair comments: VC's for sequencing and general safety.   Wheelchair Mobility     Tilt Bed    Modified Rankin (Stroke Patients Only)       Balance Overall balance assessment: Mild deficits observed, not formally tested Sitting-balance support: No upper extremity supported, Feet supported Sitting balance-Leahy Scale: Good     Standing balance support: No upper extremity supported, During functional activity Standing balance-Leahy Scale: Fair                              Hotel manager: No apparent difficulties  Cognition Arousal: Alert Behavior During Therapy: WFL for tasks assessed/performed   PT - Cognitive impairments: No apparent impairments  Following commands: Intact      Cueing Cueing Techniques: Verbal cues, Gestural cues  Exercises Total Joint Exercises Ankle Circles/Pumps: 10 reps Quad Sets: 10 reps Towel Squeeze: 10 reps Short Arc Quad: 10  reps Heel Slides: 10 reps Hip ABduction/ADduction: 10 reps Straight Leg Raises: 10 reps Long Arc Quad: 10 reps Knee Flexion: 10 reps Goniometric ROM: 92 AROM in sitting L knee    General Comments        Pertinent Vitals/Pain Pain Assessment Pain Assessment: Faces Faces Pain Scale: Hurts little more Pain Location: L knee Pain Descriptors / Indicators: Sore, Operative site guarding Pain Intervention(s): Limited activity within patient's tolerance, Monitored during session, Repositioned    Home Living Family/patient expects to be discharged to:: Private residence Living Arrangements: Alone Available Help at Discharge: Family;Available 24 hours/day (dtr) Type of Home: House Home Access: Stairs to enter Entrance Stairs-Rails: Right Entrance Stairs-Number of Steps: 5   Home Layout: One level Home Equipment: BSC/3in1 Additional Comments: pt has ordered a tub seat--but upon further discussion will probably return it and get a tub bench, adjustable bed, recliner    Prior Function            PT Goals (current goals can now be found in the care plan section) Acute Rehab PT Goals Patient Stated Goal: to be able to go home today PT Goal Formulation: With patient/family Time For Goal Achievement: 01/10/24 Potential to Achieve Goals: Good Progress towards PT goals: Progressing toward goals    Frequency    7X/week      PT Plan      Co-evaluation              AM-PAC PT "6 Clicks" Mobility   Outcome Measure  Help needed turning from your back to your side while in a flat bed without using bedrails?: None Help needed moving from lying on your back to sitting on the side of a flat bed without using bedrails?: None Help needed moving to and from a bed to a chair (including a wheelchair)?: A Little Help needed standing up from a chair using your arms (e.g., wheelchair or bedside chair)?: A Little Help needed to walk in hospital room?: A Little Help needed climbing  3-5 steps with a railing? : A Little 6 Click Score: 20    End of Session Equipment Utilized During Treatment: Gait belt Activity Tolerance: Patient tolerated treatment well Patient left: in bed;with call bell/phone within reach;with family/visitor present Nurse Communication: Mobility status PT Visit Diagnosis: Unsteadiness on feet (R26.81);Pain Pain - Right/Left: Left Pain - part of body: Knee     Time: 1023-1100 PT Time Calculation (min) (ACUTE ONLY): 37 min  Charges:    $Gait Training: 8-22 mins $Therapeutic Exercise: 8-22 mins PT General Charges $$ ACUTE PT VISIT: 1 Visit                     Simone Dubois, PT, DPT Acute Rehabilitation Services Secure Chat Preferred Office: [phone]    Makayla [name] 01/07/2024, 11:40 AM

## 2024-01-07 NOTE — Evaluation (Signed)
 Occupational Therapy Evaluation and Discharge Patient Details Name: Makayla [name] MRN: [id number] DOB: Jul 14, 1961 Today's Date: 01/07/2024   History of Present Illness   Pt is a 63 y.o. female who presents on 01/06/24 for elective left total knee arthroplasty. PMH is significant for HLD, HTN, and GERD.     Clinical Impressions This 63 yo female admitted and underwent above presents to acute OT with all education completed with pt and dtr. No further OT needs, we will sign off.     If plan is discharge home, recommend the following:   A lot of help with bathing/dressing/bathroom;Assistance with cooking/housework;Assist for transportation     Functional Status Assessment   Patient has had a recent decline in their functional status and demonstrates the ability to make significant improvements in function in a reasonable and predictable amount of time. (without further need for skilled OT)     Equipment Recommendations   None recommended by OT      Precautions/Restrictions   Precautions Precautions: Fall Recall of Precautions/Restrictions: Intact Restrictions Weight Bearing Restrictions Per Provider Order: Yes LLE Weight Bearing Per Provider Order: Weight bearing as tolerated     Mobility Bed Mobility Overal bed mobility: Modified Independent Bed Mobility: Supine to Sit     Supine to sit: HOB elevated, Contact guard     General bed mobility comments: pt has adjustable bed at home    Transfers Overall transfer level: Needs assistance Equipment used: Rolling walker (2 wheels) Transfers: Sit to/from Stand Sit to Stand: Contact guard assist                  Balance Overall balance assessment: Mild deficits observed, not formally tested Sitting-balance support: No upper extremity supported, Feet supported Sitting balance-Leahy Scale: Good     Standing balance support: No upper extremity supported, During functional activity (able to stand and pull up  pants)                               ADL either performed or assessed with clinical judgement   ADL Overall ADL's : Needs assistance/impaired Eating/Feeding: Independent;Sitting   Grooming: Set up;Supervision/safety;Standing   Upper Body Bathing: Set up;Sitting   Lower Body Bathing: Minimal assistance Lower Body Bathing Details (indicate cue type and reason): CGA sit<>stand Upper Body Dressing : Set up;Sitting   Lower Body Dressing: Moderate assistance Lower Body Dressing Details (indicate cue type and reason): CGA sit<>stand Toilet Transfer: Contact guard assist   Toileting- Clothing Manipulation and Hygiene: Contact guard assist;Sit to/from stand         General ADL Comments: Educated on use of ice man machine; simulated trying to step over tub to tub seat--pt able to bear full weight on RLE to get LLE "up and over side of tub" but not clear RLE "over side of tub"     Vision Patient Visual Report: No change from baseline              Pertinent Vitals/Pain Pain Assessment Pain Assessment: 0-10 Pain Score: 5  Pain Location: L knee Pain Descriptors / Indicators: Grimacing, Guarding, Sore Pain Intervention(s): Limited activity within patient's tolerance, Monitored during session, Repositioned     Extremity/Trunk Assessment Upper Extremity Assessment Upper Extremity Assessment: Overall WFL for tasks assessed           Communication Communication Communication: No apparent difficulties   Cognition Arousal: Alert Behavior During Therapy: [facility] for tasks assessed/performed  Following commands: Intact       Cueing   Cueing Techniques: Verbal cues              Home Living Family/patient expects to be discharged to:: Private residence Living Arrangements: Alone Available Help at Discharge: Family;Available 24 hours/day (dtr) Type of Home: House Home Access: Stairs to enter [employer] of Steps: 5 Entrance Stairs-Rails: Right Home Layout: One level     Bathroom Shower/Tub: IT trainer: Standard     Home Equipment: BSC/3in1   Additional Comments: pt has ordered a tub seat--but upon further discussion will probably return it and get a tub bench, adjustable bed, recliner      Prior Functioning/Environment Prior Level of Function : Independent/Modified Independent             Mobility Comments: Pt reports independent with all mobility, not utilizing an AD for ambulation. ADLs Comments: Pt reports independent with ADLs    OT Problem List: Decreased strength;Decreased range of motion;Impaired balance (sitting and/or standing);Pain        OT Goals(Current goals can be found in the care plan section)   Acute Rehab OT Goals Patient Stated Goal: to go home today         AM-PAC OT "6 Clicks" Daily Activity     Outcome Measure Help from another person eating meals?: None Help from another person taking care of personal grooming?: A Little Help from another person toileting, which includes using toliet, bedpan, or urinal?: A Little Help from another person bathing (including washing, rinsing, drying)?: A Little Help from another person to put on and taking off regular upper body clothing?: A Little Help from another person to put on and taking off regular lower body clothing?: A Lot 6 Click Score: 18   End of Session Equipment Utilized During Treatment: Rolling walker (2 wheels) Nurse Communication:  (no further OT needs)  Activity Tolerance: Patient tolerated treatment well Patient left:  (sitting EOB)  OT Visit Diagnosis: Other abnormalities of gait and mobility (R26.89);Unsteadiness on feet (R26.81);Pain Pain - Right/Left: Left Pain - part of body: Knee                Time: 1610-9604 OT Time Calculation (min): 18 min Charges:  OT General Charges $OT Visit: 1 Visit OT Evaluation $OT Eval Moderate  Complexity: 1 Mod  Makayla L. OT Acute Rehabilitation Services Office [phone]    Lenox Raider 01/07/2024, 10:59 AM

## 2024-01-07 NOTE — Plan of Care (Signed)
 Pt doing well. Pt and daughter given D/C instructions with verbal understanding. Rx's were sent to the pharmacy by MD. Pt's incision is clean and dry with no sign of infection. Pt's IV was removed prior to D/C. Pt received RW from [employer] per MD order. Pt D/C'd home via wheelchair per MD order. Pt is stable @ D/C and has no other needs at this time. Rema Fendt, RN

## 2024-01-07 NOTE — Progress Notes (Signed)
 Subjective: 1 Day Post-Op Procedure(s) (LRB): LEFT TOTAL KNEE ARTHROPLASTY (Left) Patient reports pain as mild.    Objective: Vital signs in last 24 hours: Temp:  [97.5 F (36.4 C)-98.7 F (37.1 C)] 97.8 F (36.6 C) (06/03 0749) Pulse Rate:  [66-131] 94 (06/03 0749) Resp:  [15-21] 20 (06/03 0749) BP: (122-160)/(51-85) 135/51 (06/03 0749) SpO2:  [95 %-100 %] 97 % (06/03 0749) Weight:  [96.2 kg] 96.2 kg (06/02 0807)  Intake/Output from previous day: 06/02 0701 - 06/03 0700 In: 1620 [P.O.:720; I.V.:700; IV Piggyback:200] Out: 1050 [Urine:1000; Blood:50] Intake/Output this shift: No intake/output data recorded.  No results for input(s): "HGB" in the last 72 hours. No results for input(s): "WBC", "RBC", "HCT", "PLT" in the last 72 hours. No results for input(s): "NA", "K", "CL", "CO2", "BUN", "CREATININE", "GLUCOSE", "CALCIUM " in the last 72 hours. No results for input(s): "LABPT", "INR" in the last 72 hours.  Neurologically intact Neurovascular intact Sensation intact distally Intact pulses distally Dorsiflexion/Plantar flexion intact Incision: scant drainage No cellulitis present Compartment soft   Assessment/Plan: 1 Day Post-Op Procedure(s) (LRB): LEFT TOTAL KNEE ARTHROPLASTY (Left) Advance diet Up with therapy D/C IV fluids Discharge home with home health once cleared by PT WBAT LLE       Sandie Cross 01/07/2024, 7:59 AM

## 2024-01-08 ENCOUNTER — Encounter: Payer: Self-pay | Admitting: Orthopaedic Surgery

## 2024-01-08 ENCOUNTER — Other Ambulatory Visit: Payer: Self-pay | Admitting: Physician Assistant

## 2024-01-08 MED ORDER — HYDROMORPHONE HCL 2 MG PO TABS: 2.0000 mg | ORAL_TABLET | Freq: Three times a day (TID) | ORAL | 0 refills | Status: DC | PRN

## 2024-01-08 NOTE — Telephone Encounter (Signed)
 Sent in dilaudid  to take for breakthru pain.  Continue to ice and elevate.  The first two weeks are rough, especially the today and tomorrow.

## 2024-01-13 ENCOUNTER — Encounter: Payer: Self-pay | Admitting: Orthopaedic Surgery

## 2024-01-14 ENCOUNTER — Other Ambulatory Visit: Payer: Self-pay | Admitting: Physician Assistant

## 2024-01-14 MED ORDER — OXYCODONE-ACETAMINOPHEN 5-325 MG PO TABS: 1.0000 | ORAL_TABLET | Freq: Four times a day (QID) | ORAL | 0 refills | Status: DC | PRN

## 2024-01-14 NOTE — Telephone Encounter (Signed)
 sent

## 2024-01-16 ENCOUNTER — Encounter: Payer: Self-pay | Admitting: Orthopaedic Surgery

## 2024-01-17 ENCOUNTER — Ambulatory Visit

## 2024-01-17 NOTE — Progress Notes (Signed)
 Patient came in for a Nurse Visit-Dressing change. Removed Aquacel bandage and applied a dry dressing since she had some redness around incision. Okay per Loris Ros. We will see her as Scheduled 01/21/2024. Will call us  or send us  a Mychart if anything changes.    S/P L TKA- 01/06/2024.

## 2024-01-17 NOTE — Telephone Encounter (Signed)
 Can you make a nurse visit for her to come in and have this changed?

## 2024-01-21 ENCOUNTER — Encounter: Payer: Self-pay | Admitting: Orthopaedic Surgery

## 2024-01-21 ENCOUNTER — Ambulatory Visit (INDEPENDENT_AMBULATORY_CARE_PROVIDER_SITE_OTHER): Payer: Managed Care, Other (non HMO) | Admitting: Physician Assistant

## 2024-01-21 DIAGNOSIS — Z96652 Presence of left artificial knee joint: Secondary | ICD-10-CM

## 2024-01-21 MED ORDER — DOXYCYCLINE HYCLATE 100 MG PO TABS: 100.0000 mg | ORAL_TABLET | Freq: Two times a day (BID) | ORAL | 0 refills | Status: DC

## 2024-01-21 MED ORDER — OXYCODONE-ACETAMINOPHEN 5-325 MG PO TABS: 1.0000 | ORAL_TABLET | Freq: Four times a day (QID) | ORAL | 0 refills | Status: DC | PRN

## 2024-01-21 NOTE — Progress Notes (Signed)
 Post-Op Visit Note   Patient: Makayla [name]           Date of Birth: Dec 08, 1960           MRN: [id number] Visit Date: 01/21/2024 PCP: Clemens Curt, MD   Assessment & Plan:  Chief Complaint:  Chief Complaint  Patient presents with   Left Knee - Follow-up    Left total knee arthroplasty 01/06/2024   Visit Diagnoses:  1. Status post total left knee replacement     Plan: Patient is a pleasant 63 year old female who comes in today 2 weeks status post left total knee replacement 01/06/2024.  She has been doing okay.  She has been taking Percocet and muscle relaxers for pain.  She has been compliant taking Eliquis  twice daily for DVT prophylaxis.  She is getting home health PT and is ambulating with a walker.  She was seen by me last week for bandage change where she had a lot of irritation to the skin just beneath Aquacel.  Dry dressing applied last week.  She has noticed continued itching to the area.  No increased pain, fevers or chills or any other constitutional symptoms.  She takes Claritin daily and says that Benadryl  and Zyrtec have a stimulating effect on her.  Examination of the left knee today reveals a well-healed surgical incision with nylon sutures in place.  She does have some irritation around the sutures as well as from the surrounding areas where the Aquacel was in place.  It does appear she has small blisters as well.  Today, sutures were removed.  We have recommended topical 1% hydrocortisone  cream to the area with the exception of the incision.  If this does not help enough, she will let us  know we will send in prescription cream.  She will follow-up with us  in 4 weeks for repeat evaluation and 2 view x-rays of the left knee.  Referral to outpatient PT has been made.  She will continue with her Eliquis  for another 2 weeks and then transition to a baby aspirin twice daily for 2 weeks.  Follow-Up Instructions: Return in about 4 weeks (around 02/18/2024).   Orders:  Orders Placed  This Encounter  Procedures   Ambulatory referral to Physical Therapy   Meds ordered this encounter  Medications   doxycycline  (VIBRA -TABS) 100 MG tablet    Sig: Take 1 tablet (100 mg total) by mouth 2 (two) times daily.    Dispense:  28 tablet    Refill:  0   oxyCODONE -acetaminophen  (PERCOCET) 5-325 MG tablet    Sig: Take 1 tablet by mouth every 6 (six) hours as needed.    Dispense:  40 tablet    Refill:  0    Imaging: No new imaging  PMFS History: Patient Active Problem List   Diagnosis Date Noted   Status post total left knee replacement 01/06/2024   Pre-operative clearance 09/02/2023   Obesity due to excess calories 03/28/2023   Primary osteoarthritis of left knee 11/28/2022   Bilateral knee pain 11/23/2022   Hepatic steatosis 11/07/2022   GAD (generalized anxiety disorder) 10/24/2022   Pre-diabetes 10/24/2022   Other hyperlipidemia 10/24/2022   Depression screen 10/24/2022   Hemorrhoids 06/07/2022   Abdominal pain, left upper quadrant 02/01/2022   Rib pain on left side 06/13/2020   Colon cancer screening 05/06/2020   Insomnia, psychophysiological 03/14/2020   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   Grief reaction 10/03/2018   Generalized anxiety disorder 01/23/2017   Vitamin  D deficiency 05/20/2015   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   Morbid obesity (HCC) with starting BMI 37 10/25/2011   Routine general medical examination at a health care facility 10/16/2011   Hyperlipidemia LDL goal <130 12/04/2007   GERD 12/04/2007   Essential hypertension 12/04/2007   Past Medical History:  Diagnosis Date   Allergy    pollen   Anxiety    Bursitis, trochanteric    bi/lat    Dental crowns present    x 4   Diverticulosis    Fatty liver    GERD (gastroesophageal reflux disease)    Hyperlipidemia    no current med.   Hypertension    white coat syndrome,   Mass of breast, left 01/2012   Obesity    Palpitations    PONV  (postoperative nausea and vomiting)    Pre-diabetes    Pre-diabetes    Vitamin D  deficiency     Family History  Problem Relation Age of Onset   Hypertension Mother    Hyperlipidemia Mother    Diabetes Mother    Breast cancer Mother 12   Uterine cancer Mother    GER disease Father    Depression Sister    Heart disease Brother    Heart attack Maternal Grandmother    Hypertension Maternal Grandmother    Colon cancer Neg Hx    Colon polyps Neg Hx    Esophageal cancer Neg Hx    Rectal cancer Neg Hx    Stomach cancer Neg Hx     Past Surgical History:  Procedure Laterality Date   ABDOMINAL HYSTERECTOMY     partial   ANTERIOR AND POSTERIOR REPAIR WITH SACROSPINOUS FIXATION N/A 08/20/2022   Procedure: POSTERIOR REPAIR WITH PERINEORRHAPHY AND WITH  SACROSPINOUS FIXATION;  Surgeon: Arma Lamp, MD;  Location: [facility];  Service: Gynecology;  Laterality: N/A;   BREAST BIOPSY Right 08/04/2021   BREAST EXCISIONAL BIOPSY Left 2013   BREAST SURGERY  01/10/2012   lumpectomy - left (excisional biopsy benign)   CHOLECYSTECTOMY N/A 02/10/2016   Procedure: LAPAROSCOPIC CHOLECYSTECTOMY WITH INTRAOPERATIVE CHOLANGIOGRAM;  Surgeon: Lillette Reid III, MD;  Location: [facility];  Service: General;  Laterality: N/A;   COLONOSCOPY  04/06/2010   D Brodie, normal w/tics   RECTOCELE REPAIR  06/14/2011   cystocele repair, pt states Dr Manuel Sell did not do rectocele and said she had to come back later.   ROBOTIC ASSISTED LAPAROSCOPIC SACROCOLPOPEXY  06/14/2011   Procedure: ROBOTIC ASSISTED LAPAROSCOPIC SACROCOLPOPEXY;  Surgeon: Kristeen Peto, MD;  Location: [facility];  Service: Urology;  Laterality: N/A;   TOTAL KNEE ARTHROPLASTY Left 01/06/2024   Procedure: LEFT TOTAL KNEE ARTHROPLASTY;  Surgeon: Wes Hamman, MD;  Location: [facility];  Service: Orthopedics;  Laterality: Left;   TUBAL LIGATION     Social History   Occupational History   Occupation: RN-retired  Tobacco Use   Smoking status:  Never    Passive exposure: Past   Smokeless tobacco: Never  Vaping Use   Vaping status: Never Used  Substance and Sexual Activity   Alcohol use: No    Alcohol/week: 0.0 standard drinks of alcohol   Drug use: No   Sexual activity: Not Currently    Birth control/protection: Post-menopausal    Comment: has used estradiol  patch 0.025  in the past, has stopped

## 2024-01-22 NOTE — Telephone Encounter (Signed)
 Nothing over the actual incision right now

## 2024-01-27 ENCOUNTER — Ambulatory Visit (INDEPENDENT_AMBULATORY_CARE_PROVIDER_SITE_OTHER): Admitting: Physician Assistant

## 2024-02-04 ENCOUNTER — Ambulatory Visit (INDEPENDENT_AMBULATORY_CARE_PROVIDER_SITE_OTHER): Admitting: Physical Therapy

## 2024-02-04 ENCOUNTER — Encounter: Payer: Self-pay | Admitting: Physical Therapy

## 2024-02-04 DIAGNOSIS — R600 Localized edema: Secondary | ICD-10-CM

## 2024-02-04 DIAGNOSIS — M6281 Muscle weakness (generalized): Secondary | ICD-10-CM

## 2024-02-04 DIAGNOSIS — R2689 Other abnormalities of gait and mobility: Secondary | ICD-10-CM | POA: Diagnosis not present

## 2024-02-04 DIAGNOSIS — M25562 Pain in left knee: Secondary | ICD-10-CM

## 2024-02-04 DIAGNOSIS — M25662 Stiffness of left knee, not elsewhere classified: Secondary | ICD-10-CM | POA: Diagnosis not present

## 2024-02-04 NOTE — Therapy (Signed)
 OUTPATIENT PHYSICAL THERAPY EVALUATION   Patient Name: CORINNE GOUCHER MRN: [id number] DOB:[birth year], 63 y.o., female Today's Date: 02/04/2024  END OF SESSION:  PT End of Session - 02/04/24 1150     Visit Number 1    Number of Visits 16    Date for PT Re-Evaluation 03/31/24    Authorization Type [employer] $75 copay    Authorization - Number of Visits 29    PT Start Time 1140    PT Stop Time 1223    PT Time Calculation (min) 43 min    Activity Tolerance Patient tolerated treatment well    Behavior During Therapy WFL for tasks assessed/performed          Past Medical History:  Diagnosis Date   Allergy    pollen   Anxiety    Bursitis, trochanteric    bi/lat    Dental crowns present    x 4   Diverticulosis    Fatty liver    GERD (gastroesophageal reflux disease)    Hyperlipidemia    no current med.   Hypertension    white coat syndrome,   Mass of breast, left 01/2012   Obesity    Palpitations    PONV (postoperative nausea and vomiting)    Pre-diabetes    Pre-diabetes    Vitamin D  deficiency    Past Surgical History:  Procedure Laterality Date   ABDOMINAL HYSTERECTOMY     partial   ANTERIOR AND POSTERIOR REPAIR WITH SACROSPINOUS FIXATION N/A 08/20/2022   Procedure: POSTERIOR REPAIR WITH PERINEORRHAPHY AND WITH  SACROSPINOUS FIXATION;  Surgeon: Marilynne Rosaline SAILOR, MD;  Location: [facility];  Service: Gynecology;  Laterality: N/A;   BREAST BIOPSY Right 08/04/2021   BREAST EXCISIONAL BIOPSY Left 2013   BREAST SURGERY  01/10/2012   lumpectomy - left (excisional biopsy benign)   CHOLECYSTECTOMY N/A 02/10/2016   Procedure: LAPAROSCOPIC CHOLECYSTECTOMY WITH INTRAOPERATIVE CHOLANGIOGRAM;  Surgeon: Deward Null III, MD;  Location: [facility];  Service: General;  Laterality: N/A;   COLONOSCOPY  04/06/2010   D Brodie, normal w/tics   RECTOCELE REPAIR  06/14/2011   cystocele repair, pt states Dr Marykay did not do rectocele and said she had to come back later.    ROBOTIC ASSISTED LAPAROSCOPIC SACROCOLPOPEXY  06/14/2011   Procedure: ROBOTIC ASSISTED LAPAROSCOPIC SACROCOLPOPEXY;  Surgeon: Noretta Ferrara, MD;  Location: [facility];  Service: Urology;  Laterality: N/A;   TOTAL KNEE ARTHROPLASTY Left 01/06/2024   Procedure: LEFT TOTAL KNEE ARTHROPLASTY;  Surgeon: Jerri Kay HERO, MD;  Location: [facility];  Service: Orthopedics;  Laterality: Left;   TUBAL LIGATION     Patient Active Problem List   Diagnosis Date Noted   Status post total left knee replacement 01/06/2024   Pre-operative clearance 09/02/2023   Obesity due to excess calories 03/28/2023   Primary osteoarthritis of left knee 11/28/2022   Bilateral knee pain 11/23/2022   Hepatic steatosis 11/07/2022   GAD (generalized anxiety disorder) 10/24/2022   Pre-diabetes 10/24/2022   Other hyperlipidemia 10/24/2022   Depression screen 10/24/2022   Hemorrhoids 06/07/2022   Abdominal pain, left upper quadrant 02/01/2022   Rib pain on left side 06/13/2020   Colon cancer screening 05/06/2020   Insomnia, psychophysiological 03/14/2020   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   Grief reaction 10/03/2018   Generalized anxiety disorder 01/23/2017   Vitamin D  deficiency 05/20/2015   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   Morbid obesity (HCC) with starting BMI 37 10/25/2011   Routine  general medical examination at a health care facility 10/16/2011   Hyperlipidemia LDL goal <130 12/04/2007   GERD 12/04/2007   Essential hypertension 12/04/2007    PCP: Randeen Laine LABOR, MD  REFERRING PROVIDER: Jule Ronal CROME, PA-C  REFERRING DIAG: [phone] (ICD-10-CM) - Status post total left knee replacement  Rationale for Evaluation and Treatment: Rehabilitation  THERAPY DIAG:  Acute pain of left knee - Plan: PT plan of care cert/re-cert  Stiffness of left knee, not elsewhere classified - Plan: PT plan of care cert/re-cert  Muscle weakness (generalized) - Plan: PT plan of care  cert/re-cert  Other abnormalities of gait and mobility - Plan: PT plan of care cert/re-cert  Localized edema - Plan: PT plan of care cert/re-cert  ONSET DATE: DOS: 01/06/24   SUBJECTIVE:                                                                                                                                                                                           SUBJECTIVE STATEMENT: Patient is a 63 year old female status post left total knee replacement 01/06/2024. She had HHPT up until 02/03/24.  She is amb with SPC but carrying cane more than using it.  PERTINENT HISTORY:  HLD, HTN, and GERD  PAIN:  Are you having pain? Yes: NPRS scale: 3 currently, up to 8, at best 3/10 Pain location: Lt knee Pain description: aching Aggravating factors: stiff in AM Relieving factors: walking, ice, pain medication  PRECAUTIONS:  None  RED FLAGS: None   WEIGHT BEARING RESTRICTIONS:  No  FALLS:  Has patient fallen in last 6 months? 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, out to dinner with family, walking for exercise  PATIENT GOALS:  Shower independently, return to walking, driving, housework   OBJECTIVE:  Note: Objective measures were completed at Evaluation unless otherwise noted.   PATIENT SURVEYS:  Patient-Specific Activity Scoring Scheme  0 represents "unable to perform." 10 represents "able to perform at prior level. 0 1 2 3 4 5 6 7 8 9  10 (Date and Score)   Activity Eval     1. Walking 6    2. Driving  0    3. Housework 5   Score 3.67    Total score = sum of the activity scores/number of activities Minimum detectable change (90%CI) for average score = 2  points Minimum detectable change (90%CI) for single activity score = 3 points    COGNITIVE STATUS: Within functional limits for tasks assessed    GAIT: 02/04/24 Distance walked: 100' within [employer] device utilized: Single point cane (did carry in hand more than use) Level of assistance: Modified independence Comments: decreased stance on Lt, decr hip/knee flexion on Lt   EDEMA: 02/04/24  Knee joint line: Rt: 48.8 cm/Lt: 57.2 cm   LOWER EXTREMITY ROM:     ROM Right eval Left eval  Knee flexion A: 123 A: 103 P: 105  Knee extension A: 7 (hyperext) A: -2 (seated LAQ) P: 0   (Blank rows = not tested)   LOWER EXTREMITY MMT:   02/04/24: Not formally tested LLE, grossly 3-/5   MMT Right eval Left eval  Hip flexion    Hip extension    Hip abduction    Hip adduction    Hip internal rotation    Hip external rotation    Knee flexion    Knee extension    Ankle dorsiflexion    Ankle plantarflexion    Ankle inversion    Ankle eversion     (Blank rows = not tested)     FUNCTIONAL TESTS:  02/04/24 5 times sit to stand: 16.00 sec without UE support   TREATMENT:                                                                                                                              DATE:  02/04/24 TherEx See HEP - demonstrated with trial reps performed PRN, mod cues for comprehension    Self Care Educated on PT POC and clinical findings   PATIENT EDUCATION:  Education details: HEP Person educated: Patient Education method: Programmer, multimedia, Facilities manager, and Handouts Education comprehension: verbalized understanding, returned demonstration, and needs further education  HOME EXERCISE PROGRAM: Access Code: [email or web address] URL: [email or web address] Date: 02/04/2024 Prepared by: Corean Ku  Exercises - Supine Heel Slide with Strap  - 5-10 x daily - 7 x weekly - 1 sets - 10 reps - Sitting Knee Extension with Resistance  - 2-3 x daily - 7  x weekly - 1 sets - 10 reps - 5 sec hold - Seated Straight Leg Raise  - 2-3 x daily - 7 x weekly - 1 sets - 10 reps - 5 sec hold - Sit to Stand  - 2-3 x daily - 7 x weekly - 1 sets - 10 reps   ASSESSMENT:  CLINICAL IMPRESSION: Patient is a 63 y.o. female who was seen today for physical therapy evaluation and treatment for Lt TKA. She demonstrates decreased strength, ROM, and balance as well as gait abnormalities and expected post op pain and swelling affecting functional mobility.  She will benefit from PT to address deficits listed.     OBJECTIVE IMPAIRMENTS: Abnormal gait, decreased balance, decreased knowledge of use of DME, decreased mobility, difficulty walking, decreased ROM, decreased strength, hypomobility, and pain.  ACTIVITY LIMITATIONS: carrying, lifting, sitting, standing, squatting, sleeping, stairs, transfers, bed mobility, bathing, and locomotion level  PARTICIPATION LIMITATIONS: meal prep, cleaning, laundry, driving, shopping, community activity, and occupation  PERSONAL FACTORS: 3+ comorbidities: HLD, HTN, and GERD are also affecting patient's functional outcome.   REHAB POTENTIAL: Good  CLINICAL DECISION MAKING: Evolving/moderate complexity  EVALUATION COMPLEXITY: Moderate   GOALS: Goals reviewed with patient? Yes  SHORT TERM GOALS: Target date: 03/03/2024  Independent with initial HEP Goal status: INITIAL  2.  Lt knee AROM improved to 0-110 deg for improved function Goal status: INITIAL   LONG TERM GOALS: Target date: 03/31/2024  Independent with final HEP Goal status: INITIAL  2.  PSFS score improved by 3 points Goal status: INITIAL  3.  Lt knee AROM improved to 0-115 deg for improved mobility Goal status: INIITAL  4.  Report pain < 3/10 with standing and walking activities for improved function Goal status: INITIAL  5.  Amb without AD without significant gait deviations for improved community access Goal status: INITIAL  6.  Improve 5x STS  to < 13 sec for improved functional strength Goal status: INITIAL     PLAN:  PT FREQUENCY: 2x/week  PT DURATION: 8 weeks  PLANNED INTERVENTIONS: 97164- PT Re-evaluation, 97750- Physical Performance Testing, 97110-Therapeutic exercises, 97530- Therapeutic activity, [id number]- Neuromuscular re-education, 97535- Self Care, 02859- Manual therapy, [id number]- Gait training, [phone]- Aquatic Therapy, [phone]- Electrical stimulation (unattended), 97016- Vasopneumatic device, [id number]- Ultrasound, 79439 (1-2 muscles), 20561 (3+ muscles)- Dry Needling, Patient/Family education, Balance training, Stair training, Taping, Joint mobilization, Cryotherapy, and Moist heat.  PLAN FOR NEXT SESSION: Review HEP, early flexion focus and quad activation, work on transitioning fully from cane   NEXT MD VISIT: 02/18/24   Corean JULIANNA Ku, PT, DPT 02/04/24 12:42 PM

## 2024-02-06 ENCOUNTER — Encounter: Payer: Self-pay | Admitting: Orthopaedic Surgery

## 2024-02-06 ENCOUNTER — Ambulatory Visit (INDEPENDENT_AMBULATORY_CARE_PROVIDER_SITE_OTHER): Admitting: Physical Therapy

## 2024-02-06 ENCOUNTER — Encounter: Payer: Self-pay | Admitting: Physical Therapy

## 2024-02-06 ENCOUNTER — Other Ambulatory Visit: Payer: Self-pay | Admitting: Physician Assistant

## 2024-02-06 DIAGNOSIS — R600 Localized edema: Secondary | ICD-10-CM

## 2024-02-06 DIAGNOSIS — M6281 Muscle weakness (generalized): Secondary | ICD-10-CM

## 2024-02-06 DIAGNOSIS — M25662 Stiffness of left knee, not elsewhere classified: Secondary | ICD-10-CM | POA: Diagnosis not present

## 2024-02-06 DIAGNOSIS — R279 Unspecified lack of coordination: Secondary | ICD-10-CM

## 2024-02-06 DIAGNOSIS — M25562 Pain in left knee: Secondary | ICD-10-CM

## 2024-02-06 DIAGNOSIS — R2689 Other abnormalities of gait and mobility: Secondary | ICD-10-CM

## 2024-02-06 MED ORDER — METHOCARBAMOL 750 MG PO TABS: 750.0000 mg | ORAL_TABLET | Freq: Three times a day (TID) | ORAL | 2 refills | Status: DC | PRN

## 2024-02-06 MED ORDER — HYDROCODONE-ACETAMINOPHEN 5-325 MG PO TABS: 1.0000 | ORAL_TABLET | Freq: Four times a day (QID) | ORAL | 0 refills | Status: DC | PRN

## 2024-02-06 NOTE — Telephone Encounter (Signed)
Sent in norco and robaxin

## 2024-02-06 NOTE — Therapy (Signed)
 OUTPATIENT PHYSICAL THERAPY TREATMENT   Patient Name: Makayla [name] MRN: [id number] DOB:07-10-1961, 63 y.o., female Today's Date: 02/06/2024  END OF SESSION:  PT End of Session - 02/06/24 1310     Visit Number 2    Number of Visits 16    Date for PT Re-Evaluation 03/31/24    Authorization Type [employer] $75 copay    Authorization - Number of Visits 29    PT Start Time 1302    PT Stop Time 1340    PT Time Calculation (min) 38 min    Activity Tolerance Patient tolerated treatment well    Behavior During Therapy WFL for tasks assessed/performed           Past Medical History:  Diagnosis Date   Allergy    pollen   Anxiety    Bursitis, trochanteric    bi/lat    Dental crowns present    x 4   Diverticulosis    Fatty liver    GERD (gastroesophageal reflux disease)    Hyperlipidemia    no current med.   Hypertension    white coat syndrome,   Mass of breast, left 01/2012   Obesity    Palpitations    PONV (postoperative nausea and vomiting)    Pre-diabetes    Pre-diabetes    Vitamin D  deficiency    Past Surgical History:  Procedure Laterality Date   ABDOMINAL HYSTERECTOMY     partial   ANTERIOR AND POSTERIOR REPAIR WITH SACROSPINOUS FIXATION N/A 08/20/2022   Procedure: POSTERIOR REPAIR WITH PERINEORRHAPHY AND WITH  SACROSPINOUS FIXATION;  Surgeon: Marilynne Rosaline SAILOR, MD;  Location: [facility];  Service: Gynecology;  Laterality: N/A;   BREAST BIOPSY Right 08/04/2021   BREAST EXCISIONAL BIOPSY Left 2013   BREAST SURGERY  01/10/2012   lumpectomy - left (excisional biopsy benign)   CHOLECYSTECTOMY N/A 02/10/2016   Procedure: LAPAROSCOPIC CHOLECYSTECTOMY WITH INTRAOPERATIVE CHOLANGIOGRAM;  Surgeon: Deward Null III, MD;  Location: [facility];  Service: General;  Laterality: N/A;   COLONOSCOPY  04/06/2010   D Brodie, normal w/tics   RECTOCELE REPAIR  06/14/2011   cystocele repair, pt states Dr Marykay did not do rectocele and said she had to come back later.    ROBOTIC ASSISTED LAPAROSCOPIC SACROCOLPOPEXY  06/14/2011   Procedure: ROBOTIC ASSISTED LAPAROSCOPIC SACROCOLPOPEXY;  Surgeon: Noretta Ferrara, MD;  Location: [facility];  Service: Urology;  Laterality: N/A;   TOTAL KNEE ARTHROPLASTY Left 01/06/2024   Procedure: LEFT TOTAL KNEE ARTHROPLASTY;  Surgeon: Jerri Kay HERO, MD;  Location: [facility];  Service: Orthopedics;  Laterality: Left;   TUBAL LIGATION     Patient Active Problem List   Diagnosis Date Noted   Status post total left knee replacement 01/06/2024   Pre-operative clearance 09/02/2023   Obesity due to excess calories 03/28/2023   Primary osteoarthritis of left knee 11/28/2022   Bilateral knee pain 11/23/2022   Hepatic steatosis 11/07/2022   GAD (generalized anxiety disorder) 10/24/2022   Pre-diabetes 10/24/2022   Other hyperlipidemia 10/24/2022   Depression screen 10/24/2022   Hemorrhoids 06/07/2022   Abdominal pain, left upper quadrant 02/01/2022   Rib pain on left side 06/13/2020   Colon cancer screening 05/06/2020   Insomnia, psychophysiological 03/14/2020   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   Grief reaction 10/03/2018   Generalized anxiety disorder 01/23/2017   Vitamin D  deficiency 05/20/2015   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   Morbid obesity (HCC) with starting BMI 37 10/25/2011  Routine general medical examination at a health care facility 10/16/2011   Hyperlipidemia LDL goal <130 12/04/2007   GERD 12/04/2007   Essential hypertension 12/04/2007    PCP: Randeen Laine LABOR, MD  REFERRING PROVIDER: Jule Ronal CROME, PA-C  REFERRING DIAG: [phone] (ICD-10-CM) - Status post total left knee replacement  Rationale for Evaluation and Treatment: Rehabilitation  THERAPY DIAG:  Acute pain of left knee  Stiffness of left knee, not elsewhere classified  Muscle weakness (generalized)  Other abnormalities of gait and mobility  Localized edema  Unspecified lack of coordination  ONSET  DATE: DOS: 01/06/24   SUBJECTIVE:                                                                                                                                                                                           SUBJECTIVE STATEMENT: Pt states she could hear her knee clicking with the heel slide exercise. Has been consistent with her exercises.   PERTINENT HISTORY:  HLD, HTN, and GERD  PAIN:  Are you having pain? Yes: NPRS scale: 4 currently, up to 8, at best 3/10 Pain location: Lt knee Pain description: aching Aggravating factors: stiff in AM Relieving factors: walking, ice, pain medication  PRECAUTIONS:  None  RED FLAGS: None   WEIGHT BEARING RESTRICTIONS:  No  FALLS:  Has patient fallen in last 6 months? 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, out to dinner with family, walking for exercise  PATIENT GOALS:  Shower independently, return to walking, driving, housework   OBJECTIVE:  Note: Objective measures were completed at Evaluation unless otherwise noted.   PATIENT SURVEYS:  Patient-Specific Activity Scoring Scheme  0 represents "unable to perform." 10 represents "able to perform at prior level. 0 1 2 3 4 5 6 7 8 9  10 (Date and Score)   Activity Eval     1. Walking 6    2. Driving  0    3. Housework 5   Score 3.67    Total score = sum of the activity scores/number of activities Minimum detectable change (90%CI) for average score = 2 points Minimum detectable change (90%CI) for single activity score = 3 points    COGNITIVE STATUS: Within functional limits for tasks assessed    GAIT: 02/04/24 Distance walked: 100' within [employer] device  utilized: Single point cane (did  carry in hand more than use) Level of assistance: Modified independence Comments: decreased stance on Lt, decr hip/knee flexion on Lt   EDEMA: 02/04/24  Knee joint line: Rt: 48.8 cm/Lt: 57.2 cm   LOWER EXTREMITY ROM:     ROM Right eval Left eval  Knee flexion A: 123 A: 103 P: 105  Knee extension A: 7 (hyperext) A: -2 (seated LAQ) P: 0   (Blank rows = not tested)   LOWER EXTREMITY MMT:   02/04/24: Not formally tested LLE, grossly 3-/5   MMT Right eval Left eval  Hip flexion    Hip extension    Hip abduction    Hip adduction    Hip internal rotation    Hip external rotation    Knee flexion    Knee extension    Ankle dorsiflexion    Ankle plantarflexion    Ankle inversion    Ankle eversion     (Blank rows = not tested)     FUNCTIONAL TESTS:  02/04/24 5 times sit to stand: 16.00 sec without UE support   TREATMENT:                                                                                                                              DATE:  02/06/24 TherEx Scifit bike L1 x3 min fwd, 3 min bwd Seated knee flexion self flexion 10x5 sec Seated hamstring curl green TB 2x10 Seated LAQ eccentrics 2x10 Seated LAQ green TB 2x10 Seated SLR 2x10  TherAct Standing hip abd green TB 2x10 Standing hip ext green TB 2x10 Leg press 100# double leg 2x10  Vaso L LE elevated, 10 min, 34 deg, medium compression   DATE:  02/04/24 TherEx See HEP - demonstrated with trial reps performed PRN, mod cues for comprehension    Self Care Educated on PT POC and clinical findings   PATIENT EDUCATION:  Education details: HEP Person educated: Patient Education method: Programmer, multimedia, Facilities manager, and Handouts Education comprehension: verbalized understanding, returned demonstration, and needs further education  HOME EXERCISE PROGRAM: Access Code: [email or web address] URL: [email or web address] Date: 02/04/2024 Prepared by: Corean Ku  Exercises - Supine Heel Slide with Strap  - 5-10 x daily - 7 x weekly - 1 sets - 10 reps - Sitting Knee Extension with Resistance  - 2-3 x daily - 7 x weekly - 1 sets - 10 reps - 5 sec hold - Seated Straight Leg Raise  - 2-3 x daily - 7 x weekly - 1 sets - 10 reps - 5 sec hold - Sit to Stand  - 2-3 x daily - 7 x weekly - 1 sets - 10 reps   ASSESSMENT:  CLINICAL IMPRESSION: Treatment focused on reviewing HEP. Continued knee ROM and progressing strengthening. Pt has very good quad strength and control.   From eval: Patient is a 63 y.o. female who was seen today for physical therapy evaluation and treatment for Lt TKA. She demonstrates decreased strength, ROM, and  balance as well as gait abnormalities and expected post op pain and swelling affecting functional mobility.  She will benefit from PT to address deficits listed.   OBJECTIVE IMPAIRMENTS: Abnormal gait, decreased balance, decreased knowledge of use of DME, decreased mobility, difficulty walking, decreased ROM, decreased strength, hypomobility, and pain.    GOALS: Goals reviewed with patient? Yes  SHORT TERM GOALS: Target date: 03/03/2024  Independent with initial HEP Goal status: INITIAL  2.  Lt knee AROM improved to 0-110 deg for improved function Goal status: INITIAL   LONG TERM GOALS: Target date: 03/31/2024  Independent with final HEP Goal status: INITIAL  2.  PSFS score improved by 3 points Goal status: INITIAL  3.  Lt knee AROM improved to 0-115 deg for improved mobility Goal status: INIITAL  4.  Report pain < 3/10 with standing and walking activities for improved function Goal status: INITIAL  5.  Amb without AD without significant gait deviations for improved community access Goal status: INITIAL  6.  Improve 5x STS to < 13 sec for improved functional strength Goal status: INITIAL     PLAN:  PT FREQUENCY: 2x/week  PT DURATION: 8 weeks  PLANNED INTERVENTIONS: 97164- PT Re-evaluation,  97750- Physical Performance Testing, 97110-Therapeutic exercises, 97530- Therapeutic activity, [id number]- Neuromuscular re-education, 97535- Self Care, 02859- Manual therapy, [id number]- Gait training, [phone]- Aquatic Therapy, [phone]- Electrical stimulation (unattended), 97016- Vasopneumatic device, [id number]- Ultrasound, 79439 (1-2 muscles), 20561 (3+ muscles)- Dry Needling, Patient/Family education, Balance training, Stair training, Taping, Joint mobilization, Cryotherapy, and Moist heat.  PLAN FOR NEXT SESSION: Review HEP, early flexion focus and quad activation, work on transitioning fully from cane   NEXT MD VISIT: 02/18/24   Cayman Kielbasa April Ma L Draydon [place], PT, DPT 02/06/24 1:11 PM

## 2024-02-10 ENCOUNTER — Encounter: Payer: Self-pay | Admitting: Physical Therapy

## 2024-02-10 ENCOUNTER — Ambulatory Visit (INDEPENDENT_AMBULATORY_CARE_PROVIDER_SITE_OTHER): Admitting: Physical Therapy

## 2024-02-10 DIAGNOSIS — M25662 Stiffness of left knee, not elsewhere classified: Secondary | ICD-10-CM | POA: Diagnosis not present

## 2024-02-10 DIAGNOSIS — R2689 Other abnormalities of gait and mobility: Secondary | ICD-10-CM

## 2024-02-10 DIAGNOSIS — M25562 Pain in left knee: Secondary | ICD-10-CM | POA: Diagnosis not present

## 2024-02-10 DIAGNOSIS — M6281 Muscle weakness (generalized): Secondary | ICD-10-CM | POA: Diagnosis not present

## 2024-02-10 DIAGNOSIS — R600 Localized edema: Secondary | ICD-10-CM

## 2024-02-10 NOTE — Therapy (Addendum)
 OUTPATIENT PHYSICAL THERAPY TREATMENT   Patient Name: Makayla [name] MRN: [id number] DOB:1961/05/20, 63 y.o., female Today's Date: 02/10/2024  END OF SESSION:  PT End of Session - 02/10/24 1056     Visit Number 3    Number of Visits 16    Date for PT Re-Evaluation 03/31/24    Authorization Type [employer] $75 copay    Authorization - Number of Visits 29    PT Start Time 1058    PT Stop Time 1155    PT Time Calculation (min) 57 min    Activity Tolerance Patient tolerated treatment well    Behavior During Therapy WFL for tasks assessed/performed            Past Medical History:  Diagnosis Date   Allergy    pollen   Anxiety    Bursitis, trochanteric    bi/lat    Dental crowns present    x 4   Diverticulosis    Fatty liver    GERD (gastroesophageal reflux disease)    Hyperlipidemia    no current med.   Hypertension    white coat syndrome,   Mass of breast, left 01/2012   Obesity    Palpitations    PONV (postoperative nausea and vomiting)    Pre-diabetes    Pre-diabetes    Vitamin D  deficiency    Past Surgical History:  Procedure Laterality Date   ABDOMINAL HYSTERECTOMY     partial   ANTERIOR AND POSTERIOR REPAIR WITH SACROSPINOUS FIXATION N/A 08/20/2022   Procedure: POSTERIOR REPAIR WITH PERINEORRHAPHY AND WITH  SACROSPINOUS FIXATION;  Surgeon: Marilynne Rosaline SAILOR, MD;  Location: [facility];  Service: Gynecology;  Laterality: N/A;   BREAST BIOPSY Right 08/04/2021   BREAST EXCISIONAL BIOPSY Left 2013   BREAST SURGERY  01/10/2012   lumpectomy - left (excisional biopsy benign)   CHOLECYSTECTOMY N/A 02/10/2016   Procedure: LAPAROSCOPIC CHOLECYSTECTOMY WITH INTRAOPERATIVE CHOLANGIOGRAM;  Surgeon: Deward Null III, MD;  Location: [facility];  Service: General;  Laterality: N/A;   COLONOSCOPY  04/06/2010   D Brodie, normal w/tics   RECTOCELE REPAIR  06/14/2011   cystocele repair, pt states Dr Marykay did not do rectocele and said she had to come back later.    ROBOTIC ASSISTED LAPAROSCOPIC SACROCOLPOPEXY  06/14/2011   Procedure: ROBOTIC ASSISTED LAPAROSCOPIC SACROCOLPOPEXY;  Surgeon: Noretta Ferrara, MD;  Location: [facility];  Service: Urology;  Laterality: N/A;   TOTAL KNEE ARTHROPLASTY Left 01/06/2024   Procedure: LEFT TOTAL KNEE ARTHROPLASTY;  Surgeon: Jerri Kay HERO, MD;  Location: [facility];  Service: Orthopedics;  Laterality: Left;   TUBAL LIGATION     Patient Active Problem List   Diagnosis Date Noted   Status post total left knee replacement 01/06/2024   Pre-operative clearance 09/02/2023   Obesity due to excess calories 03/28/2023   Primary osteoarthritis of left knee 11/28/2022   Bilateral knee pain 11/23/2022   Hepatic steatosis 11/07/2022   GAD (generalized anxiety disorder) 10/24/2022   Pre-diabetes 10/24/2022   Other hyperlipidemia 10/24/2022   Depression screen 10/24/2022   Hemorrhoids 06/07/2022   Abdominal pain, left upper quadrant 02/01/2022   Rib pain on left side 06/13/2020   Colon cancer screening 05/06/2020   Insomnia, psychophysiological 03/14/2020   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   Grief reaction 10/03/2018   Generalized anxiety disorder 01/23/2017   Vitamin D  deficiency 05/20/2015   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   Morbid obesity (HCC) with starting BMI 37 10/25/2011  Routine general medical examination at a health care facility 10/16/2011   Hyperlipidemia LDL goal <130 12/04/2007   GERD 12/04/2007   Essential hypertension 12/04/2007    PCP: Makayla Laine LABOR, MD  REFERRING PROVIDER: Jule Ronal LITTIE [name]  REFERRING DIAG: [phone] (ICD-10-CM) - Status post total left knee replacement  Rationale for Evaluation and Treatment: Rehabilitation  THERAPY DIAG:  Acute pain of left knee  Stiffness of left knee, not elsewhere classified  Muscle weakness (generalized)  Other abnormalities of gait and mobility  Localized edema  ONSET DATE: DOS:  01/06/24   SUBJECTIVE:  SUBJECTIVE STATEMENT: Patient states she is hurting and sore after the weekend without specific cause or doing anything out of the ordinary. She mentions she ran out of regular medication she uses for pain and it could be due to that. Patient reports pain and discomfort overnight and says she has been sleeping on her back instead of her side. Patient has been consistent with exercises.   PERTINENT HISTORY:  HLD, HTN, and GERD  PAIN:  Are you having pain?  Yes: NPRS scale: 4 currently, up to 8, at best 3/10 Pain location: Lt knee Pain description: aching Aggravating factors: stiff in AM Relieving factors: walking, ice, pain medication  PRECAUTIONS:  None  RED FLAGS: None   WEIGHT BEARING RESTRICTIONS:  No  FALLS:  Has patient fallen in last 6 months? 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, out to dinner with family, walking for exercise  PATIENT GOALS:  Shower independently, return to walking, driving, housework   OBJECTIVE:  Note: Objective measures were completed at Evaluation unless otherwise noted.   PATIENT SURVEYS:  Patient-Specific Activity Scoring Scheme  0 represents "unable to perform." 10 represents "able to perform at prior level. 0 1 2 3 4 5 6 7 8 9  10 (Date and Score)   Activity Eval     1. Walking 6    2. Driving  0    3. Housework 5   Score 3.67    Total score = sum of the activity scores/number of activities Minimum detectable change (90%CI) for average score = 2 points Minimum detectable change (90%CI) for single activity score = 3 points    COGNITIVE STATUS: Within functional limits for tasks  assessed    GAIT: 02/04/24 Distance walked: 100' within [employer] device utilized: Single point cane (did carry in hand more than use) Level of assistance: Modified independence Comments: decreased stance on Lt, decr hip/knee flexion on Lt   EDEMA: 02/04/24  Knee joint line: Rt: 48.8 cm/Lt: 57.2 cm   LOWER EXTREMITY ROM:     ROM Right eval Left eval Left 02/10/24  Knee flexion A: 123 A: 103 P: 105 P: 110  Knee extension A: 7 (hyperext) A: -2 (seated LAQ) P: 0    (Blank rows = not tested)   LOWER EXTREMITY MMT:   02/04/24: Not formally tested LLE, grossly 3-/5   MMT Right eval Left eval  Hip flexion    Hip extension    Hip abduction    Hip adduction    Hip internal rotation    Hip external rotation    Knee flexion    Knee extension  Ankle dorsiflexion    Ankle plantarflexion    Ankle inversion    Ankle eversion     (Blank rows = not tested)     FUNCTIONAL TESTS:  02/04/24 5 times sit to stand: 16.00 sec without UE support  TODAY'S TREATMENT:                                                            DATE: 02/10/24:  TherEx Bike seat 5 initial back and forth to warm up knee flexion on L LE, start backwards for 3 minutes, forward for 5 minutes. Educated about how to adjust seat on the bike to for proper mechanics. PT advised to get access to a stationary bike to continue program at home and build endurance. Patient verbalized understanding. Seated, LAQ & active knee flexion with contralateral LE opposing motion 10 reps  Therapeutic Activities: BL leg press at 100#, 2x10; PT cues for max knee flexion & ext.  L LE leg press at 37#, 1x10 Balance in tandem with each LE leading, 30 seconds, on floor with eyes open & eyes closed, and eyes open on foam;  intermittent touch on sink / chair back.    Self-Care: PT educated verbal and demo sleeping position Tenting sheets off feet and Side-lying sleeping position with pillow between ankle and knees. Patient  verbalized understanding    Manual Distraction, IR, with knee flexion overpressure  Suction Cup and instrument-assisted soft tissue technique for scar mobilization  Vaso BLE elevated, 10 min, 34 degree, medium compression    TREATMENT:                                                                                                                              DATE:  02/06/24 TherEx Scifit bike L1 x3 min fwd, 3 min bwd Seated knee flexion self flexion 10x5 sec Seated hamstring curl green TB 2x10 Seated LAQ eccentrics 2x10 Seated LAQ green TB 2x10 Seated SLR 2x10  TherAct Standing hip abd green TB 2x10 Standing hip ext green TB 2x10 Leg press 100# double leg 2x10  Vaso L LE elevated, 10 min, 34 deg, medium compression   DATE:  02/04/24 TherEx See HEP - demonstrated with trial reps performed PRN, mod cues for comprehension    Self Care Educated on PT POC and clinical findings   PATIENT EDUCATION:  Education details: HEP Person educated: Patient Education method: Programmer, multimedia, Facilities manager, and Handouts Education comprehension: verbalized understanding, returned demonstration, and needs further education  HOME EXERCISE PROGRAM: Access Code: [email or web address] URL: [email or web address] Date: 02/04/2024 Prepared by: Corean Ku  Exercises - Supine Heel Slide with Strap  - 5-10 x daily - 7 x weekly - 1 sets - 10 reps - Sitting Knee Extension with Resistance  - 2-3 x daily -  7 x weekly - 1 sets - 10 reps - 5 sec hold - Seated Straight Leg Raise  - 2-3 x daily - 7 x weekly - 1 sets - 10 reps - 5 sec hold - Sit to Stand  - 2-3 x daily - 7 x weekly - 1 sets - 10 reps   ASSESSMENT:  CLINICAL IMPRESSION:  Patient overall did well today. During leg press, she demonstrated controlled flexion and extension, indicating good quad control and strength. She demonstrated minimal sway with eyes open and right LE leading, and increased sway when on foam and with eyes  closed. Patient responded well to manual therapy and modalities to increase knee flexion of L LE. After cupping and instrument-assisted soft tissue scar treatment, patient's PROM knee flexion increased to 110, indicating scar tissue possibly being a limitation to knee flexion ROM. She will continue to benefit from skilled PT to address limitations.   OBJECTIVE IMPAIRMENTS: Abnormal gait, decreased balance, decreased knowledge of use of DME, decreased mobility, difficulty walking, decreased ROM, decreased strength, hypomobility, and pain.    GOALS: Goals reviewed with patient? Yes  SHORT TERM GOALS: Target date: 03/03/2024  Independent with initial HEP Goal status: Ongoing 02/10/24  2.  Lt knee AROM improved to 0-110 deg for improved function Goal status: Ongoing 02/10/24   LONG TERM GOALS: Target date: 03/31/2024  Independent with final HEP Goal status: Ongoing 02/10/24  2.  PSFS score improved by 3 points Goal status: Ongoing 02/10/24  3.  Lt knee AROM improved to 0-115 deg for improved mobility Goal status: Ongoing 02/10/24  4.  Report pain < 3/10 with standing and walking activities for improved function Goal status:  Ongoing 02/10/24  5.  Amb without AD without significant gait deviations for improved community access Goal status:  Ongoing 02/10/24  6.  Improve 5x STS to < 13 sec for improved functional strength Goal status:  Ongoing 02/10/24   PLAN:  PT FREQUENCY: 2x/week  PT DURATION: 8 weeks  PLANNED INTERVENTIONS: 97164- PT Re-evaluation, 97750- Physical Performance Testing, 97110-Therapeutic exercises, 97530- Therapeutic activity, 97112- Neuromuscular re-education, 97535- Self Care, 02859- Manual therapy, [id number]- Gait training, [phone]- Aquatic Therapy, [phone]- Electrical stimulation (unattended), 97016- Vasopneumatic device, [id number]- Ultrasound, 79439 (1-2 muscles), 20561 (3+ muscles)- Dry Needling, Patient/Family education, Balance training, Stair training, Taping, Joint mobilization,  Cryotherapy, and Moist heat.  PLAN FOR NEXT SESSION:  Continue to work on knee flexion and strengthening exercise. Add balance exercises in tandem stance to HEP. Continue to work towards [employer] and LTGs    NEXT MD VISIT: 02/18/24   Ismael Nap, [place], DPT 02/10/24 12:23 PM  This entire session of physical therapy was performed under the direct supervision of PT signing evaluation /treatment. PT reviewed note and agrees.   Robin Waldron, PT, DPT 02/10/2024, 1:10 PM

## 2024-02-13 ENCOUNTER — Encounter: Payer: Self-pay | Admitting: Orthopaedic Surgery

## 2024-02-14 ENCOUNTER — Ambulatory Visit (INDEPENDENT_AMBULATORY_CARE_PROVIDER_SITE_OTHER): Admitting: Physical Therapy

## 2024-02-14 ENCOUNTER — Encounter: Payer: Self-pay | Admitting: Physical Therapy

## 2024-02-14 DIAGNOSIS — M25662 Stiffness of left knee, not elsewhere classified: Secondary | ICD-10-CM

## 2024-02-14 DIAGNOSIS — M25562 Pain in left knee: Secondary | ICD-10-CM | POA: Diagnosis not present

## 2024-02-14 DIAGNOSIS — R600 Localized edema: Secondary | ICD-10-CM

## 2024-02-14 DIAGNOSIS — R279 Unspecified lack of coordination: Secondary | ICD-10-CM

## 2024-02-14 DIAGNOSIS — M6281 Muscle weakness (generalized): Secondary | ICD-10-CM | POA: Diagnosis not present

## 2024-02-14 DIAGNOSIS — R2689 Other abnormalities of gait and mobility: Secondary | ICD-10-CM | POA: Diagnosis not present

## 2024-02-14 NOTE — Therapy (Signed)
 OUTPATIENT PHYSICAL THERAPY TREATMENT   Patient Name: Makayla [name] MRN: [id number] DOB:[birth year], 63 y.o., female Today's Date: 02/14/2024  END OF SESSION:  PT End of Session - 02/14/24 1428     Visit Number 4    Number of Visits 16    Date for PT Re-Evaluation 03/31/24    Authorization Type [employer] $75 copay    Authorization - Number of Visits 29    PT Start Time 1346    PT Stop Time 1426    PT Time Calculation (min) 40 min    Activity Tolerance Patient tolerated treatment well    Behavior During Therapy WFL for tasks assessed/performed             Past Medical History:  Diagnosis Date   Allergy    pollen   Anxiety    Bursitis, trochanteric    bi/lat    Dental crowns present    x 4   Diverticulosis    Fatty liver    GERD (gastroesophageal reflux disease)    Hyperlipidemia    no current med.   Hypertension    white coat syndrome,   Mass of breast, left 01/2012   Obesity    Palpitations    PONV (postoperative nausea and vomiting)    Pre-diabetes    Pre-diabetes    Vitamin D  deficiency    Past Surgical History:  Procedure Laterality Date   ABDOMINAL HYSTERECTOMY     partial   ANTERIOR AND POSTERIOR REPAIR WITH SACROSPINOUS FIXATION N/A 08/20/2022   Procedure: POSTERIOR REPAIR WITH PERINEORRHAPHY AND WITH  SACROSPINOUS FIXATION;  Surgeon: Marilynne Rosaline SAILOR, MD;  Location: [facility];  Service: Gynecology;  Laterality: N/A;   BREAST BIOPSY Right 08/04/2021   BREAST EXCISIONAL BIOPSY Left 2013   BREAST SURGERY  01/10/2012   lumpectomy - left (excisional biopsy benign)   CHOLECYSTECTOMY N/A 02/10/2016   Procedure: LAPAROSCOPIC CHOLECYSTECTOMY WITH INTRAOPERATIVE CHOLANGIOGRAM;  Surgeon: Deward Null III, MD;  Location: [facility];  Service: General;  Laterality: N/A;   COLONOSCOPY  04/06/2010   D Brodie, normal w/tics   RECTOCELE REPAIR  06/14/2011   cystocele repair, pt states Dr Marykay did not do rectocele and said she had to come back  later.   ROBOTIC ASSISTED LAPAROSCOPIC SACROCOLPOPEXY  06/14/2011   Procedure: ROBOTIC ASSISTED LAPAROSCOPIC SACROCOLPOPEXY;  Surgeon: Noretta Ferrara, MD;  Location: [facility];  Service: Urology;  Laterality: N/A;   TOTAL KNEE ARTHROPLASTY Left 01/06/2024   Procedure: LEFT TOTAL KNEE ARTHROPLASTY;  Surgeon: Jerri Kay HERO, MD;  Location: [facility];  Service: Orthopedics;  Laterality: Left;   TUBAL LIGATION     Patient Active Problem List   Diagnosis Date Noted   Status post total left knee replacement 01/06/2024   Pre-operative clearance 09/02/2023   Obesity due to excess calories 03/28/2023   Primary osteoarthritis of left knee 11/28/2022   Bilateral knee pain 11/23/2022   Hepatic steatosis 11/07/2022   GAD (generalized anxiety disorder) 10/24/2022   Pre-diabetes 10/24/2022   Other hyperlipidemia 10/24/2022   Depression screen 10/24/2022   Hemorrhoids 06/07/2022   Abdominal pain, left upper quadrant 02/01/2022   Rib pain on left side 06/13/2020   Colon cancer screening 05/06/2020   Insomnia, psychophysiological 03/14/2020   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   Grief reaction 10/03/2018   Generalized anxiety disorder 01/23/2017   Vitamin D  deficiency 05/20/2015   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   Morbid obesity (HCC) with starting BMI 37 10/25/2011  Routine general medical examination at a health care facility 10/16/2011   Hyperlipidemia LDL goal <130 12/04/2007   GERD 12/04/2007   Essential hypertension 12/04/2007    PCP: Randeen Laine LABOR, MD  REFERRING PROVIDER: Jule Ronal LITTIE DEVONNA  REFERRING DIAG: [phone] (ICD-10-CM) - Status post total left knee replacement  Rationale for Evaluation and Treatment: Rehabilitation  THERAPY DIAG:  Acute pain of left knee  Stiffness of left knee, not elsewhere classified  Muscle weakness (generalized)  Other abnormalities of gait and mobility  Localized edema  Unspecified lack of  coordination  ONSET DATE: DOS: 01/06/24   SUBJECTIVE:  SUBJECTIVE STATEMENT:   Doing OK, stiff today and tired of hurting. A stitch popped out but they told me to put neosporin and a bandaid, they will remove it at the appt on Tuesday   PERTINENT HISTORY:  HLD, HTN, and GERD  PAIN:  Are you having pain?  3/10 Pain location: Lt knee Pain description: aching Aggravating factors: stiff  Relieving factors: walking, ice, pain medication  PRECAUTIONS:  None  RED FLAGS: None   WEIGHT BEARING RESTRICTIONS:  No  FALLS:  Has patient fallen in last 6 months? 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, out to dinner with family, walking for exercise  PATIENT GOALS:  Shower independently, return to walking, driving, housework   OBJECTIVE:  Note: Objective measures were completed at Evaluation unless otherwise noted.   PATIENT SURVEYS:  Patient-Specific Activity Scoring Scheme  0 represents "unable to perform." 10 represents "able to perform at prior level. 0 1 2 3 4 5 6 7 8 9  10 (Date and Score)   Activity Eval     1. Walking 6    2. Driving  0    3. Housework 5   Score 3.67    Total score = sum of the activity scores/number of activities Minimum detectable change (90%CI) for average score = 2 points Minimum detectable change (90%CI) for single activity score = 3 points    COGNITIVE STATUS: Within functional limits for tasks assessed    GAIT: 02/04/24 Distance walked: 100' within [employer] device utilized: Single point cane (did carry in hand more than use) Level of assistance: Modified independence Comments: decreased stance on Lt, decr hip/knee flexion on  Lt   EDEMA: 02/04/24  Knee joint line: Rt: 48.8 cm/Lt: 57.2 cm   LOWER EXTREMITY ROM:     ROM Right eval Left eval Left 02/10/24  Knee flexion A: 123 A: 103 P: 105 P: 110  Knee extension A: 7 (hyperext) A: -2 (seated LAQ) P: 0    (Blank rows = not tested)   LOWER EXTREMITY MMT:   02/04/24: Not formally tested LLE, grossly 3-/5   MMT Right eval Left eval  Hip flexion    Hip extension    Hip abduction    Hip adduction    Hip internal rotation    Hip external rotation    Knee flexion    Knee extension    Ankle dorsiflexion    Ankle plantarflexion    Ankle inversion    Ankle eversion     (Blank rows = not tested)     FUNCTIONAL TESTS:  02/04/24 5 times  sit to stand: 16.00 sec without UE support  TODAY'S TREATMENT:                                                            DATE:  02/14/24  Scifit bike seat 9 with seat progressions as tolerated full rotations x10 min for ROM and w/u  Tandem stance 3x30 seconds B solid surface   Self knee flexion stretch 12x5 seconds from high mat table  LAQs 5.5# 2x10 L  Bridges with progressive knee flexion 3x5   Retrograde massage in elevation for edema control Tennis ball massage to quad    02/10/24:  TherEx Bike seat 5 initial back and forth to warm up knee flexion on L LE, start backwards for 3 minutes, forward for 5 minutes. Educated about how to adjust seat on the bike to for proper mechanics. PT advised to get access to a stationary bike to continue program at home and build endurance. Patient verbalized understanding. Seated, LAQ & active knee flexion with contralateral LE opposing motion 10 reps  Therapeutic Activities: BL leg press at 100#, 2x10; PT cues for max knee flexion & ext.  L LE leg press at 37#, 1x10 Balance in tandem with each LE leading, 30 seconds, on floor with eyes open & eyes closed, and eyes open on foam;  intermittent touch on sink / chair back.    Self-Care: PT educated verbal and demo  sleeping position Tenting sheets off feet and Side-lying sleeping position with pillow between ankle and knees. Patient verbalized understanding    Manual Distraction, IR, with knee flexion overpressure  Suction Cup and instrument-assisted soft tissue technique for scar mobilization  Vaso BLE elevated, 10 min, 34 degree, medium compression    TREATMENT:                                                                                                                              DATE:  02/06/24 TherEx Scifit bike L1 x3 min fwd, 3 min bwd Seated knee flexion self flexion 10x5 sec Seated hamstring curl green TB 2x10 Seated LAQ eccentrics 2x10 Seated LAQ green TB 2x10 Seated SLR 2x10  TherAct Standing hip abd green TB 2x10 Standing hip ext green TB 2x10 Leg press 100# double leg 2x10  Vaso L LE elevated, 10 min, 34 deg, medium compression   DATE:  02/04/24 TherEx See HEP - demonstrated with trial reps performed PRN, mod cues for comprehension    Self Care Educated on PT POC and clinical findings   PATIENT EDUCATION:  Education details: HEP Person educated: Patient Education method: Programmer, multimedia, Facilities manager, and Handouts Education comprehension: verbalized understanding, returned demonstration, and needs further education  HOME EXERCISE PROGRAM:  Access Code: [email or web address] URL: [email or web address] Date: 02/14/2024 Prepared by: Josette Rough  Exercises - Supine Heel Slide with Strap  - 5-10 x daily - 7 x weekly - 1 sets - 10 reps - Sitting Knee Extension with Resistance  - 2-3 x daily - 7 x weekly - 1 sets - 10 reps - 5 sec hold - Seated Straight Leg Raise  - 2-3 x daily - 7 x weekly - 1 sets - 10 reps - 5 sec hold - Sit to Stand  - 2-3 x daily - 7 x weekly - 1 sets - 10 reps - Tandem Stance in Corner  - 1 x daily - 7 x weekly - 1 sets - 6 reps - 30 seconds  hold - Tandem Walking with Counter Support  - 1 x daily - 7 x weekly - 1 sets - 4-5  reps   ASSESSMENT:  CLINICAL IMPRESSION:      Arrives today still feeling stiff, but she really did well with interventions provided this session. Continued work on ROM and strength as tolerated, also introduced retrograde massage and percussion gun to quad this session. Anticipate she will continue to progress nicely with skilled services.    OBJECTIVE IMPAIRMENTS: Abnormal gait, decreased balance, decreased knowledge of use of DME, decreased mobility, difficulty walking, decreased ROM, decreased strength, hypomobility, and pain.    GOALS: Goals reviewed with patient? Yes  SHORT TERM GOALS: Target date: 03/03/2024  Independent with initial HEP Goal status: Ongoing 02/10/24  2.  Lt knee AROM improved to 0-110 deg for improved function Goal status: Ongoing 02/10/24   LONG TERM GOALS: Target date: 03/31/2024  Independent with final HEP Goal status: Ongoing 02/10/24  2.  PSFS score improved by 3 points Goal status: Ongoing 02/10/24  3.  Lt knee AROM improved to 0-115 deg for improved mobility Goal status: Ongoing 02/10/24  4.  Report pain < 3/10 with standing and walking activities for improved function Goal status:  Ongoing 02/10/24  5.  Amb without AD without significant gait deviations for improved community access Goal status:  Ongoing 02/10/24  6.  Improve 5x STS to < 13 sec for improved functional strength Goal status:  Ongoing 02/10/24   PLAN:  PT FREQUENCY: 2x/week  PT DURATION: 8 weeks  PLANNED INTERVENTIONS: 97164- PT Re-evaluation, 97750- Physical Performance Testing, 97110-Therapeutic exercises, 97530- Therapeutic activity, 97112- Neuromuscular re-education, 97535- Self Care, 02859- Manual therapy, [id number]- Gait training, [phone]- Aquatic Therapy, [phone]- Electrical stimulation (unattended), 97016- Vasopneumatic device, [id number]- Ultrasound, 79439 (1-2 muscles), 20561 (3+ muscles)- Dry Needling, Patient/Family education, Balance training, Stair training, Taping, Joint  mobilization, Cryotherapy, and Moist heat.  PLAN FOR NEXT SESSION:  Continue to work on knee flexion and strengthening exercise. Continue to work towards [employer] and LTGs.     NEXT MD VISIT: 02/18/24   Josette Rough, PT, DPT 02/14/24 2:29 PM

## 2024-02-17 ENCOUNTER — Encounter: Payer: Self-pay | Admitting: Physical Therapy

## 2024-02-17 ENCOUNTER — Ambulatory Visit (INDEPENDENT_AMBULATORY_CARE_PROVIDER_SITE_OTHER): Payer: Self-pay | Admitting: Physical Therapy

## 2024-02-17 DIAGNOSIS — R2689 Other abnormalities of gait and mobility: Secondary | ICD-10-CM

## 2024-02-17 DIAGNOSIS — M6281 Muscle weakness (generalized): Secondary | ICD-10-CM | POA: Diagnosis not present

## 2024-02-17 DIAGNOSIS — M25662 Stiffness of left knee, not elsewhere classified: Secondary | ICD-10-CM | POA: Diagnosis not present

## 2024-02-17 DIAGNOSIS — R600 Localized edema: Secondary | ICD-10-CM

## 2024-02-17 DIAGNOSIS — M25562 Pain in left knee: Secondary | ICD-10-CM

## 2024-02-17 DIAGNOSIS — R279 Unspecified lack of coordination: Secondary | ICD-10-CM

## 2024-02-17 NOTE — Therapy (Addendum)
 OUTPATIENT PHYSICAL THERAPY TREATMENT   Patient Name: Makayla [name] MRN: [id number] DOB:1960/12/17, 63 y.o., female Today's Date: 02/17/2024  END OF SESSION:  PT End of Session - 02/17/24 1103     Visit Number 5    Number of Visits 16    Date for PT Re-Evaluation 03/31/24    Authorization Type [employer] $[id number] copay    Authorization - Number of Visits 29    PT Start Time 1101    PT Stop Time 1200    PT Time Calculation (min) 59 min    Activity Tolerance Patient tolerated treatment well    Behavior During Therapy WFL for tasks assessed/performed              Past Medical History:  Diagnosis Date   Allergy    pollen   Anxiety    Bursitis, trochanteric    bi/lat    Dental crowns present    x 4   Diverticulosis    Fatty liver    GERD (gastroesophageal reflux disease)    Hyperlipidemia    no current med.   Hypertension    white coat syndrome,   Mass of breast, left 01/2012   Obesity    Palpitations    PONV (postoperative nausea and vomiting)    Pre-diabetes    Pre-diabetes    Vitamin D  deficiency    Past Surgical History:  Procedure Laterality Date   ABDOMINAL HYSTERECTOMY     partial   ANTERIOR AND POSTERIOR REPAIR WITH SACROSPINOUS FIXATION N/A 08/20/2022   Procedure: POSTERIOR REPAIR WITH PERINEORRHAPHY AND WITH  SACROSPINOUS FIXATION;  Surgeon: Marilynne Rosaline SAILOR, MD;  Location: [facility];  Service: Gynecology;  Laterality: N/A;   BREAST BIOPSY Right 08/04/2021   BREAST EXCISIONAL BIOPSY Left 2013   BREAST SURGERY  01/10/2012   lumpectomy - left (excisional biopsy benign)   CHOLECYSTECTOMY N/A 02/10/2016   Procedure: LAPAROSCOPIC CHOLECYSTECTOMY WITH INTRAOPERATIVE CHOLANGIOGRAM;  Surgeon: Deward Null III, MD;  Location: [facility];  Service: General;  Laterality: N/A;   COLONOSCOPY  04/06/2010   D Brodie, normal w/tics   RECTOCELE REPAIR  06/14/2011   cystocele repair, pt states Dr Marykay did not do rectocele and said she had to come back  later.   ROBOTIC ASSISTED LAPAROSCOPIC SACROCOLPOPEXY  06/14/2011   Procedure: ROBOTIC ASSISTED LAPAROSCOPIC SACROCOLPOPEXY;  Surgeon: Noretta Ferrara, MD;  Location: [facility];  Service: Urology;  Laterality: N/A;   TOTAL KNEE ARTHROPLASTY Left 01/06/2024   Procedure: LEFT TOTAL KNEE ARTHROPLASTY;  Surgeon: Jerri Kay HERO, MD;  Location: [facility];  Service: Orthopedics;  Laterality: Left;   TUBAL LIGATION     Patient Active Problem List   Diagnosis Date Noted   Status post total left knee replacement 01/06/2024   Pre-operative clearance 09/02/2023   Obesity due to excess calories 03/28/2023   Primary osteoarthritis of left knee 11/28/2022   Bilateral knee pain 11/23/2022   Hepatic steatosis 11/07/2022   GAD (generalized anxiety disorder) 10/24/2022   Pre-diabetes 10/24/2022   Other hyperlipidemia 10/24/2022   Depression screen 10/24/2022   Hemorrhoids 06/07/2022   Abdominal pain, left upper quadrant 02/01/2022   Rib pain on left side 06/13/2020   Colon cancer screening 05/06/2020   Insomnia, psychophysiological 03/14/2020   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   Grief reaction 10/03/2018   Generalized anxiety disorder 01/23/2017   Vitamin D  deficiency 05/20/2015   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   Morbid obesity (HCC) with starting BMI  37 10/25/2011   Routine general medical examination at a health care facility 10/16/2011   Hyperlipidemia LDL goal <130 12/04/2007   GERD 12/04/2007   Essential hypertension 12/04/2007    PCP: Randeen Laine LABOR, MD  REFERRING PROVIDER: Jule Ronal LITTIE DEVONNA  REFERRING DIAG: [phone] (ICD-10-CM) - Status post total left knee replacement  Rationale for Evaluation and Treatment: Rehabilitation  THERAPY DIAG:  Acute pain of left knee  Stiffness of left knee, not elsewhere classified  Muscle weakness (generalized)  Other abnormalities of gait and mobility  Localized edema  Unspecified lack of  coordination  ONSET DATE: DOS: 01/06/24   SUBJECTIVE:  SUBJECTIVE STATEMENT: Patient reports the knee feeling stiff. A stitch popped out but they told her to put neosporin and a bandaid, and they will remove it at the appt on Tuesday. Some redness with minimal tenderness appeared by scar.   PERTINENT HISTORY:  HLD, HTN, and GERD  PAIN:  Are you having pain?  3/10 at rest, 5/10 with prolonged activity.  Pain location: Lt knee Pain description: aching Aggravating factors: stiff  Relieving factors: walking, ice, pain medication  PRECAUTIONS:  None  RED FLAGS: None   WEIGHT BEARING RESTRICTIONS:  No  FALLS:  Has patient fallen in last 6 months? 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, out to dinner with family, walking for exercise  PATIENT GOALS:  Shower independently, return to walking, driving, housework   OBJECTIVE:  Note: Objective measures were completed at Evaluation unless otherwise noted.   PATIENT SURVEYS:  Patient-Specific Activity Scoring Scheme  0 represents "unable to perform." 10 represents "able to perform at prior level. 0 1 2 3 4 5 6 7 8 9  10 (Date and Score)   Activity Eval     1. Walking 6    2. Driving  0    3. Housework 5   Score 3.67    Total score = sum of the activity scores/number of activities Minimum detectable change (90%CI) for average score = 2 points Minimum detectable change (90%CI) for single activity score = 3 points    COGNITIVE STATUS: Within functional limits for tasks assessed    GAIT: 02/04/24 Distance walked: 100' within [employer] device utilized: Single point cane (did carry in hand more than use) Level of  assistance: Modified independence Comments: decreased stance on Lt, decr hip/knee flexion on Lt   EDEMA: 02/04/24  Knee joint line: Rt: 48.8 cm/Lt: 57.2 cm   LOWER EXTREMITY ROM:     ROM Right eval Left eval Left 02/10/24  Knee flexion A: 123 A: 103 P: 105 P: 110  Knee extension A: 7 (hyperext) A: -2 (seated LAQ) P: 0    (Blank rows = not tested)   LOWER EXTREMITY MMT:   02/04/24: Not formally tested LLE, grossly 3-/5   MMT Right 02/17/24 Left 02/17/24  Hip flexion    Hip extension    Hip abduction    Hip adduction    Hip internal rotation    Hip external rotation    Knee flexion 5/5 4-/5  Knee extension HH dynameter 62.9#, 63.2#  HH dynameter 35.2#, 33.3# LLE: RLE 54%   Ankle dorsiflexion    Ankle plantarflexion  Ankle inversion    Ankle eversion     (Blank rows = not tested)  FUNCTIONAL TESTS:  02/04/24 5 times sit to stand: 16.00 sec without UE support    TODAY'S TREATMENT:                                                            DATE: 02/17/2024  TherEx: Bike, seat 5, level 3, for 8 minutes  Step up/downs using LLE, 2x10, step-through pattern with R UE assist  LLE Gastroc step stretch, 3x30 seconds BLE Heel raises, BL, 2x10 Standing LLE Quad stretch w/ towel, 3x30 seconds PT updated HEP to include above 3 exercises including HO, demo & verba cues.  Pt verbalized understanding after performing in clinic.   TherAct: BL leg press, 125#, 3x10, cues to bring toes into DF for full knee extension LLE leg press, 43#, 1x10 See objective data for MMT  Vaso  LLE with elevation, 10 min, 38 degree, medium compression   02/14/24  Scifit bike seat 9 with seat progressions as tolerated full rotations x10 min for ROM and w/u  Tandem stance 3x30 seconds B solid surface   Self knee flexion stretch 12x5 seconds from high mat table  LAQs 5.5# 2x10 L  Bridges with progressive knee flexion 3x5   Retrograde massage in elevation for edema control Tennis ball  massage to quad    02/10/24:  TherEx Bike seat 5 initial back and forth to warm up knee flexion on L LE, start backwards for 3 minutes, forward for 5 minutes. Educated about how to adjust seat on the bike to for proper mechanics. PT advised to get access to a stationary bike to continue program at home and build endurance. Patient verbalized understanding. Seated, LAQ & active knee flexion with contralateral LE opposing motion 10 reps  Therapeutic Activities: BL leg press at 100#, 2x10; PT cues for max knee flexion & ext.  L LE leg press at 37#, 1x10 Balance in tandem with each LE leading, 30 seconds, on floor with eyes open & eyes closed, and eyes open on foam;  intermittent touch on sink / chair back.    Self-Care: PT educated verbal and demo sleeping position Tenting sheets off feet and Side-lying sleeping position with pillow between ankle and knees. Patient verbalized understanding    Manual Distraction, IR, with knee flexion overpressure  Suction Cup and instrument-assisted soft tissue technique for scar mobilization  Vaso BLE elevated, 10 min, 34 degree, medium compression   PATIENT EDUCATION:  Education details: HEP Person educated: Patient Education method: Explanation, Demonstration, and Handouts Education comprehension: verbalized understanding, returned demonstration, and needs further education  HOME EXERCISE PROGRAM:  Access Code: [email or web address] URL: [email or web address] Date: 02/17/2024 Prepared by: Grayce Spatz   Exercises - Supine Heel Slide with Strap  - 5-10 x daily - 7 x weekly - 1 sets - 10 reps - Sitting Knee Extension with Resistance  - 2-3 x daily - 7 x weekly - 1 sets - 10 reps - 5 sec hold - Seated Straight Leg Raise  - 2-3 x daily - 7 x weekly - 1 sets - 10 reps - 5 sec hold - Sit to Stand  - 2-3 x daily - 7 x weekly - 1 sets - 10 reps - Tandem Stance in Corner  -  1 x daily - 7 x weekly - 1 sets - 6 reps - 30 seconds  hold - Tandem  Walking with Counter Support  - 1 x daily - 7 x weekly - 1 sets - 4-5 reps - Gastroc Stretch on Step  - 1-2 x daily - 7 x weekly - 1 sets - 3 reps - 30 seconds hold - Heel Raises with Counter Support  - 1 x daily - 7 x weekly - 2 sets - 10 reps - 3-5 seconds hold - Standing Quad Stretch with Towel and Arm Support  - 1-2 x daily - 7 x weekly - 1 sets - 3 reps - 30 seconds hold   ASSESSMENT:  CLINICAL IMPRESSION:  Patient arrived to today's session feeling stiff but responded well to treatment. Her strength has progressed but still has weakness in her L LE. She required verbal cueing for good eccentric control during leg press and it proved somewhat difficult for her.  She would appear to have a better understanding of HEP.  She should continue to progress well.    OBJECTIVE IMPAIRMENTS: Abnormal gait, decreased balance, decreased knowledge of use of DME, decreased mobility, difficulty walking, decreased ROM, decreased strength, hypomobility, and pain.    GOALS: Goals reviewed with patient? Yes  SHORT TERM GOALS: Target date: 03/03/2024  Independent with initial HEP Goal status: Ongoing 02/17/2024  2.  Lt knee AROM improved to 0-110 deg for improved function Goal status: Ongoing  02/17/2024   LONG TERM GOALS: Target date: 03/31/2024  Independent with final HEP Goal status: Ongoing 02/17/2024  2.  PSFS score improved by 3 points Goal status: Ongoing 02/17/2024  3.  Lt knee AROM improved to 0-115 deg for improved mobility Goal status: Ongoing 02/17/2024  4.  Report pain < 3/10 with standing and walking activities for improved function Goal status:  Ongoing 02/17/2024  5.  Amb without AD without significant gait deviations for improved community access Goal status:  Ongoing 02/17/2024  6.  Improve 5x STS to < 13 sec for improved functional strength Goal status:  Ongoing 02/17/2024   PLAN:  PT FREQUENCY: 2x/week  PT DURATION: 8 weeks  PLANNED INTERVENTIONS: 97164- PT  Re-evaluation, 97750- Physical Performance Testing, 97110-Therapeutic exercises, 97530- Therapeutic activity, 97112- Neuromuscular re-education, 97535- Self Care, 02859- Manual therapy, [phone]- Gait training, [phone]- Aquatic Therapy, [phone]- Electrical stimulation (unattended), 97016- Vasopneumatic device, [id number]- Ultrasound, 79439 (1-2 muscles), 20561 (3+ muscles)- Dry Needling, Patient/Family education, Balance training, Stair training, Taping, Joint mobilization, Cryotherapy, and Moist heat.  PLAN FOR NEXT SESSION: stair training with Attempt step-through pattern, continue with knee flexion/extension and strengthening exercises.    NEXT MD VISIT: 02/18/24   Ismael Theophilus Stallion, SPT 02/17/2024, 12:15 PM  This entire session of physical therapy was performed under the direct supervision of PT signing evaluation /treatment. PT reviewed note and agrees.   Grayce Spatz, PT, DPT 02/17/2024, 5:24 PM

## 2024-02-18 ENCOUNTER — Encounter: Payer: Self-pay | Admitting: Physician Assistant

## 2024-02-18 ENCOUNTER — Other Ambulatory Visit (INDEPENDENT_AMBULATORY_CARE_PROVIDER_SITE_OTHER): Payer: Self-pay

## 2024-02-18 ENCOUNTER — Ambulatory Visit (INDEPENDENT_AMBULATORY_CARE_PROVIDER_SITE_OTHER): Admitting: Physician Assistant

## 2024-02-18 DIAGNOSIS — Z96652 Presence of left artificial knee joint: Secondary | ICD-10-CM

## 2024-02-18 MED ORDER — METHOCARBAMOL 750 MG PO TABS
750.0000 mg | ORAL_TABLET | Freq: Three times a day (TID) | ORAL | 2 refills | Status: DC | PRN
Start: 2024-02-18 — End: 2024-04-07

## 2024-02-18 MED ORDER — HYDROCODONE-ACETAMINOPHEN 5-325 MG PO TABS: 1.0000 | ORAL_TABLET | Freq: Three times a day (TID) | ORAL | 0 refills | Status: DC | PRN

## 2024-02-18 NOTE — Progress Notes (Signed)
 Post-Op Visit Note   Patient: Makayla [name]           Date of Birth: 1961/06/04           MRN: [id number] Visit Date: 02/18/2024 PCP: Randeen Laine DELENA, MD   Assessment & Plan:  Chief Complaint:  Chief Complaint  Patient presents with   Left Knee - Follow-up    Left total knee arthroplasty 01/06/2024   Visit Diagnoses:  1. Status post total left knee replacement     Plan: Patient is a pleasant 63 year old female who comes in today 6 weeks status post left total knee replacement 01/06/2024.  She has been doing better.  She has been taking Norco for pain.  She has been taking baby aspirin twice daily for DVT prophylaxis and is noticed bright red blood from her stool over the past few days.  She does have a history of hemorrhoids.  No GI symptoms.  She is in physical therapy making good progress.  Examination of the left knee reveals a small Vicryl suture poking through.  Otherwise, fully healed surgical scar without complication.  Range of motion 0 to 120 degrees.  She is neurovascular intact distally.  Today, suture was removed.  Mupirocin and a Band-Aid was applied.  She will continue with physical therapy.  I have instructed her to stop the aspirin due to the bleeding.  I have also instructed that she reach out to her PCP about this.  She will follow-up with us  in 6 weeks for recheck.  Dental prophylaxis reinforced.  Call with concerns or questions.  Follow-Up Instructions: Return in about 6 weeks (around 03/31/2024).   Orders:  Orders Placed This Encounter  Procedures   XR Knee 1-2 Views Left   Meds ordered this encounter  Medications   HYDROcodone -acetaminophen  (NORCO/VICODIN) 5-325 MG tablet    Sig: Take 1 tablet by mouth 3 (three) times daily as needed for moderate pain (pain score 4-6).    Dispense:  30 tablet    Refill:  0   methocarbamol  (ROBAXIN ) 750 MG tablet    Sig: Take 1 tablet (750 mg total) by mouth 3 (three) times daily as needed.    Dispense:  30 tablet    Refill:  2     Imaging: No results found.  PMFS History: Patient Active Problem List   Diagnosis Date Noted   Status post total left knee replacement 01/06/2024   Pre-operative clearance 09/02/2023   Obesity due to excess calories 03/28/2023   Primary osteoarthritis of left knee 11/28/2022   Bilateral knee pain 11/23/2022   Hepatic steatosis 11/07/2022   GAD (generalized anxiety disorder) 10/24/2022   Pre-diabetes 10/24/2022   Other hyperlipidemia 10/24/2022   Depression screen 10/24/2022   Hemorrhoids 06/07/2022   Abdominal pain, left upper quadrant 02/01/2022   Rib pain on left side 06/13/2020   Colon cancer screening 05/06/2020   Insomnia, psychophysiological 03/14/2020   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   Grief reaction 10/03/2018   Generalized anxiety disorder 01/23/2017   Vitamin D  deficiency 05/20/2015   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   Morbid obesity (HCC) with starting BMI 37 10/25/2011   Routine general medical examination at a health care facility 10/16/2011   Hyperlipidemia LDL goal <130 12/04/2007   GERD 12/04/2007   Essential hypertension 12/04/2007   Past Medical History:  Diagnosis Date   Allergy    pollen   Anxiety    Bursitis, trochanteric    bi/lat  Dental crowns present    x 4   Diverticulosis    Fatty liver    GERD (gastroesophageal reflux disease)    Hyperlipidemia    no current med.   Hypertension    white coat syndrome,   Mass of breast, left 01/2012   Obesity    Palpitations    PONV (postoperative nausea and vomiting)    Pre-diabetes    Pre-diabetes    Vitamin D  deficiency     Family History  Problem Relation Age of Onset   Hypertension Mother    Hyperlipidemia Mother    Diabetes Mother    Breast cancer Mother 71   Uterine cancer Mother    GER disease Father    Depression Sister    Heart disease Brother    Heart attack Maternal Grandmother    Hypertension Maternal Grandmother    Colon  cancer Neg Hx    Colon polyps Neg Hx    Esophageal cancer Neg Hx    Rectal cancer Neg Hx    Stomach cancer Neg Hx     Past Surgical History:  Procedure Laterality Date   ABDOMINAL HYSTERECTOMY     partial   ANTERIOR AND POSTERIOR REPAIR WITH SACROSPINOUS FIXATION N/A 08/20/2022   Procedure: POSTERIOR REPAIR WITH PERINEORRHAPHY AND WITH  SACROSPINOUS FIXATION;  Surgeon: Marilynne Rosaline SAILOR, MD;  Location: [facility] [place];  Service: Gynecology;  Laterality: N/A;   BREAST BIOPSY Right 08/04/2021   BREAST EXCISIONAL BIOPSY Left 2013   BREAST SURGERY  01/10/2012   lumpectomy - left (excisional biopsy benign)   CHOLECYSTECTOMY N/A 02/10/2016   Procedure: LAPAROSCOPIC CHOLECYSTECTOMY WITH INTRAOPERATIVE CHOLANGIOGRAM;  Surgeon: Deward Null III, MD;  Location: [facility];  Service: General;  Laterality: N/A;   COLONOSCOPY  04/06/2010   D Brodie, normal w/tics   RECTOCELE REPAIR  06/14/2011   cystocele repair, pt states Dr Marykay did not do rectocele and said she had to come back later.   ROBOTIC ASSISTED LAPAROSCOPIC SACROCOLPOPEXY  06/14/2011   Procedure: ROBOTIC ASSISTED LAPAROSCOPIC SACROCOLPOPEXY;  Surgeon: Noretta Ferrara, MD;  Location: [facility];  Service: Urology;  Laterality: N/A;   TOTAL KNEE ARTHROPLASTY Left 01/06/2024   Procedure: LEFT TOTAL KNEE ARTHROPLASTY;  Surgeon: Jerri Kay HERO, MD;  Location: [facility];  Service: Orthopedics;  Laterality: Left;   TUBAL LIGATION     Social History   Occupational History   Occupation: RN-retired  Tobacco Use   Smoking status: Never    Passive exposure: Past   Smokeless tobacco: Never  Vaping Use   Vaping status: Never Used  Substance and Sexual Activity   Alcohol use: No    Alcohol/week: 0.0 standard drinks of alcohol   Drug use: No   Sexual activity: Not Currently    Birth control/protection: Post-menopausal    Comment: has used estradiol  patch 0.025  in the past, has stopped

## 2024-02-21 ENCOUNTER — Ambulatory Visit (INDEPENDENT_AMBULATORY_CARE_PROVIDER_SITE_OTHER): Admitting: Physical Therapy

## 2024-02-21 ENCOUNTER — Encounter: Payer: Self-pay | Admitting: Physical Therapy

## 2024-02-21 DIAGNOSIS — M25662 Stiffness of left knee, not elsewhere classified: Secondary | ICD-10-CM

## 2024-02-21 DIAGNOSIS — R600 Localized edema: Secondary | ICD-10-CM

## 2024-02-21 DIAGNOSIS — M6281 Muscle weakness (generalized): Secondary | ICD-10-CM | POA: Diagnosis not present

## 2024-02-21 DIAGNOSIS — M25562 Pain in left knee: Secondary | ICD-10-CM

## 2024-02-21 DIAGNOSIS — R2689 Other abnormalities of gait and mobility: Secondary | ICD-10-CM

## 2024-02-21 NOTE — Therapy (Signed)
 OUTPATIENT PHYSICAL THERAPY TREATMENT   Patient Name: DEANA KROCK MRN: [id number] DOB:April 03, 1961, 63 y.o., female Today's Date: 02/21/2024  END OF SESSION:  PT End of Session - 02/21/24 1056     Visit Number 6    Number of Visits 16    Date for PT Re-Evaluation 03/31/24    Authorization Type [employer] $75 copay    Authorization - Number of Visits 29    PT Start Time 1056    PT Stop Time 1144    PT Time Calculation (min) 48 min    Activity Tolerance Patient tolerated treatment well    Behavior During Therapy WFL for tasks assessed/performed               Past Medical History:  Diagnosis Date   Allergy    pollen   Anxiety    Bursitis, trochanteric    bi/lat    Dental crowns present    x 4   Diverticulosis    Fatty liver    GERD (gastroesophageal reflux disease)    Hyperlipidemia    no current med.   Hypertension    white coat syndrome,   Mass of breast, left 01/2012   Obesity    Palpitations    PONV (postoperative nausea and vomiting)    Pre-diabetes    Pre-diabetes    Vitamin D  deficiency    Past Surgical History:  Procedure Laterality Date   ABDOMINAL HYSTERECTOMY     partial   ANTERIOR AND POSTERIOR REPAIR WITH SACROSPINOUS FIXATION N/A 08/20/2022   Procedure: POSTERIOR REPAIR WITH PERINEORRHAPHY AND WITH  SACROSPINOUS FIXATION;  Surgeon: Marilynne Rosaline SAILOR, MD;  Location: [facility];  Service: Gynecology;  Laterality: N/A;   BREAST BIOPSY Right 08/04/2021   BREAST EXCISIONAL BIOPSY Left 2013   BREAST SURGERY  01/10/2012   lumpectomy - left (excisional biopsy benign)   CHOLECYSTECTOMY N/A 02/10/2016   Procedure: LAPAROSCOPIC CHOLECYSTECTOMY WITH INTRAOPERATIVE CHOLANGIOGRAM;  Surgeon: Deward Null III, MD;  Location: [facility];  Service: General;  Laterality: N/A;   COLONOSCOPY  04/06/2010   D Brodie, normal w/tics   RECTOCELE REPAIR  06/14/2011   cystocele repair, pt states Dr Marykay did not do rectocele and said she had to come back  later.   ROBOTIC ASSISTED LAPAROSCOPIC SACROCOLPOPEXY  06/14/2011   Procedure: ROBOTIC ASSISTED LAPAROSCOPIC SACROCOLPOPEXY;  Surgeon: Noretta Ferrara, MD;  Location: [facility];  Service: Urology;  Laterality: N/A;   TOTAL KNEE ARTHROPLASTY Left 01/06/2024   Procedure: LEFT TOTAL KNEE ARTHROPLASTY;  Surgeon: Jerri Kay HERO, MD;  Location: [facility];  Service: Orthopedics;  Laterality: Left;   TUBAL LIGATION     Patient Active Problem List   Diagnosis Date Noted   Status post total left knee replacement 01/06/2024   Pre-operative clearance 09/02/2023   Obesity due to excess calories 03/28/2023   Primary osteoarthritis of left knee 11/28/2022   Bilateral knee pain 11/23/2022   Hepatic steatosis 11/07/2022   GAD (generalized anxiety disorder) 10/24/2022   Pre-diabetes 10/24/2022   Other hyperlipidemia 10/24/2022   Depression screen 10/24/2022   Hemorrhoids 06/07/2022   Abdominal pain, left upper quadrant 02/01/2022   Rib pain on left side 06/13/2020   Colon cancer screening 05/06/2020   Insomnia, psychophysiological 03/14/2020   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   Grief reaction 10/03/2018   Generalized anxiety disorder 01/23/2017   Vitamin D  deficiency 05/20/2015   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   Morbid obesity (HCC) with starting  BMI 37 10/25/2011   Routine general medical examination at a health care facility 10/16/2011   Hyperlipidemia LDL goal <130 12/04/2007   GERD 12/04/2007   Essential hypertension 12/04/2007    PCP: Randeen Laine LABOR, MD  REFERRING PROVIDER: Jule Ronal CROME, PA-C  REFERRING DIAG: [phone] (ICD-10-CM) - Status post total left knee replacement  Rationale for Evaluation and Treatment: Rehabilitation  THERAPY DIAG:  Acute pain of left knee  Stiffness of left knee, not elsewhere classified  Muscle weakness (generalized)  Other abnormalities of gait and mobility  Localized edema  ONSET DATE: DOS:  01/06/24   SUBJECTIVE:  SUBJECTIVE STATEMENT:  Knee popped the other day and has been more sore since then, but not overly painful.  MD visit went well.   PERTINENT HISTORY:  HLD, HTN, and GERD  PAIN:  Are you having pain?  0/10 at rest, 5/10 with prolonged activity.  Pain location: Lt knee Pain description: aching Aggravating factors: stiff  Relieving factors: walking, ice, pain medication  PRECAUTIONS:  None  RED FLAGS: None   WEIGHT BEARING RESTRICTIONS:  No  FALLS:  Has patient fallen in last 6 months? 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, out to dinner with family, walking for exercise  PATIENT GOALS:  Shower independently, return to walking, driving, housework   OBJECTIVE:  Note: Objective measures were completed at Evaluation unless otherwise noted.   PATIENT SURVEYS:  Patient-Specific Activity Scoring Scheme  0 represents "unable to perform." 10 represents "able to perform at prior level. 0 1 2 3 4 5 6 7 8 9  10 (Date and Score)   Activity Eval     1. Walking 6    2. Driving  0    3. Housework 5   Score 3.67    Total score = sum of the activity scores/number of activities Minimum detectable change (90%CI) for average score = 2 points Minimum detectable change (90%CI) for single activity score = 3 points    COGNITIVE STATUS: Within functional limits for tasks assessed    GAIT: 02/04/24 Distance walked: 100' within [employer] device utilized: Single point cane (did carry in hand more than use) Level of assistance: Modified independence Comments: decreased stance on Lt, decr hip/knee flexion on Lt   EDEMA: 02/04/24  Knee joint line: Rt:  48.8 cm/Lt: 57.2 cm   LOWER EXTREMITY ROM:     ROM Right eval Left eval Left 02/10/24  Knee flexion A: 123 A: 103 P: 105 P: 110  Knee extension A: 7 (hyperext) A: -2 (seated LAQ) P: 0    (Blank rows = not tested)   LOWER EXTREMITY MMT:   02/04/24: Not formally tested LLE, grossly 3-/5   MMT Right 02/17/24 Left 02/17/24  Knee flexion 5/5 4-/5  Knee extension HH dynameter 62.9#, 63.2#  HH dynameter 35.2#, 33.3# LLE: RLE 54%    (Blank rows = not tested)  FUNCTIONAL TESTS:  02/04/24 5 times sit to stand: 16.00 sec without UE support    TODAY'S TREATMENT:  DATE: 02/21/24 TherEx NuStep L5 x 8 min; LEs only Knee extension LLE only 5# 2x10  TherAct Leg press bil 125#, 3x10; then LLE only 37# 3x10 Step up and over leading with LLE 2x10; 0-1 UE support needed Forward step ups 2x10 bil on 6 step with intermittent UE support needed Negotiated stairs alternating working on decreasing reliance on UEs; intermittent UE support needed TRX squats 2x10 - min cues for technique  Vaso BLE elevated, 10 min, 34 degree, medium compression to Lt knee  02/17/2024  TherEx: Bike, seat 5, level 3, for 8 minutes  Step up/downs using LLE, 2x10, step-through pattern with R UE assist  LLE Gastroc step stretch, 3x30 seconds BLE Heel raises, BL, 2x10 Standing LLE Quad stretch w/ towel, 3x30 seconds PT updated HEP to include above 3 exercises including HO, demo & verba cues.  Pt verbalized understanding after performing in clinic.   TherAct: BL leg press, 125#, 3x10, cues to bring toes into DF for full knee extension LLE leg press, 43#, 1x10 See objective data for MMT  Vaso  LLE with elevation, 10 min, 38 degree, medium compression   02/14/24  Scifit bike seat 9 with seat progressions as tolerated full rotations x10 min for ROM and w/u  Tandem stance 3x30 seconds B solid surface   Self knee flexion stretch 12x5 seconds from high  mat table  LAQs 5.5# 2x10 L  Bridges with progressive knee flexion 3x5   Retrograde massage in elevation for edema control Tennis ball massage to quad    02/10/24:  TherEx Bike seat 5 initial back and forth to warm up knee flexion on L LE, start backwards for 3 minutes, forward for 5 minutes. Educated about how to adjust seat on the bike to for proper mechanics. PT advised to get access to a stationary bike to continue program at home and build endurance. Patient verbalized understanding. Seated, LAQ & active knee flexion with contralateral LE opposing motion 10 reps  Therapeutic Activities: BL leg press at 100#, 2x10; PT cues for max knee flexion & ext.  L LE leg press at 37#, 1x10 Balance in tandem with each LE leading, 30 seconds, on floor with eyes open & eyes closed, and eyes open on foam;  intermittent touch on sink / chair back.    Self-Care: PT educated verbal and demo sleeping position Tenting sheets off feet and Side-lying sleeping position with pillow between ankle and knees. Patient verbalized understanding    Manual Distraction, IR, with knee flexion overpressure  Suction Cup and instrument-assisted soft tissue technique for scar mobilization  Vaso BLE elevated, 10 min, 34 degree, medium compression   PATIENT EDUCATION:  Education details: HEP Person educated: Patient Education method: Explanation, Demonstration, and Handouts Education comprehension: verbalized understanding, returned demonstration, and needs further education  HOME EXERCISE PROGRAM:  Access Code: [email or web address] URL: [email or web address] Date: 02/17/2024 Prepared by: Grayce Spatz   Exercises - Supine Heel Slide with Strap  - 5-10 x daily - 7 x weekly - 1 sets - 10 reps - Sitting Knee Extension with Resistance  - 2-3 x daily - 7 x weekly - 1 sets - 10 reps - 5 sec hold - Seated Straight Leg Raise  - 2-3 x daily - 7 x weekly - 1 sets - 10 reps - 5 sec hold - Sit to Stand  - 2-3 x  daily - 7 x weekly - 1 sets - 10 reps - Tandem Stance in Corner  - 1 x daily - 7 x  weekly - 1 sets - 6 reps - 30 seconds  hold - Tandem Walking with Counter Support  - 1 x daily - 7 x weekly - 1 sets - 4-5 reps - Gastroc Stretch on Step  - 1-2 x daily - 7 x weekly - 1 sets - 3 reps - 30 seconds hold - Heel Raises with Counter Support  - 1 x daily - 7 x weekly - 2 sets - 10 reps - 3-5 seconds hold - Standing Quad Stretch with Towel and Arm Support  - 1-2 x daily - 7 x weekly - 1 sets - 3 reps - 30 seconds hold   ASSESSMENT:  CLINICAL IMPRESSION:   Pt with good progress with functional strengthening today but still has some difficulty with navigating stairs reciprocally.  Overall progressing well with PT and anticipate she will be ready to transition to community and home programs in the next 1-2 weeks.      OBJECTIVE IMPAIRMENTS: Abnormal gait, decreased balance, decreased knowledge of use of DME, decreased mobility, difficulty walking, decreased ROM, decreased strength, hypomobility, and pain.    GOALS: Goals reviewed with patient? Yes  SHORT TERM GOALS: Target date: 03/03/2024  Independent with initial HEP Goal status: Ongoing 02/17/2024  2.  Lt knee AROM improved to 0-110 deg for improved function Goal status: Ongoing  02/17/2024   LONG TERM GOALS: Target date: 03/31/2024  Independent with final HEP Goal status: Ongoing 02/17/2024  2.  PSFS score improved by 3 points Goal status: Ongoing 02/17/2024  3.  Lt knee AROM improved to 0-115 deg for improved mobility Goal status: Ongoing 02/17/2024  4.  Report pain < 3/10 with standing and walking activities for improved function Goal status:  Ongoing 02/17/2024  5.  Amb without AD without significant gait deviations for improved community access Goal status:  Ongoing 02/17/2024  6.  Improve 5x STS to < 13 sec for improved functional strength Goal status:  Ongoing 02/17/2024   PLAN:  PT FREQUENCY: 2x/week  PT DURATION: 8  weeks  PLANNED INTERVENTIONS: 97164- PT Re-evaluation, 97750- Physical Performance Testing, 97110-Therapeutic exercises, 97530- Therapeutic activity, 97112- Neuromuscular re-education, 97535- Self Care, 02859- Manual therapy, [phone]- Gait training, [phone]- Aquatic Therapy, [phone]- Electrical stimulation (unattended), 97016- Vasopneumatic device, [id number]- Ultrasound, 79439 (1-2 muscles), 20561 (3+ muscles)- Dry Needling, Patient/Family education, Balance training, Stair training, Taping, Joint mobilization, Cryotherapy, and Moist heat.  PLAN FOR NEXT SESSION: look at STGs, anticipate d/c 1-2 weeks,  stair training with Attempt step-through pattern, continue with knee flexion/extension and strengthening exercises.    NEXT MD VISIT: 02/18/24   Corean JULIANNA Ku, PT, DPT 02/21/24 11:39 AM

## 2024-02-24 ENCOUNTER — Ambulatory Visit: Payer: Self-pay

## 2024-02-24 NOTE — Telephone Encounter (Signed)
 Appt scheduled with Mallie, NP on Friday 7/25, will route to PCP and Mallie

## 2024-02-24 NOTE — Telephone Encounter (Signed)
 FYI Only or Action Required?: FYI only for provider.  Patient was last seen in primary care on 09/02/2023 by Randeen Laine LABOR, MD.  Called Nurse Triage reporting Rectal Bleeding.  Symptoms began a week ago.  Interventions attempted: Nothing.  Symptoms are: unchanged.  Triage Disposition: See PCP Within 2 Weeks  Patient/caregiver understands and will follow disposition?: Yes     Reason for Disposition  [1] Rectal bleeding is minimal (e.g., blood just on toilet paper, few drops, streaks on surface of normal formed BM) AND [2] bleeding recurs 3 or more times after using Care Advice  Answer Assessment - Initial Assessment Questions 1. APPEARANCE of BLOOD: What color is it? Is it passed separately, on the surface of the stool, or mixed in with the stool?      Streaks of blood on the stool 2. AMOUNT: How much blood was passed?      Small amount of blood on stool, drops on toilet paper, sometimes toilet water is slightly red  3. FREQUENCY: How many times has blood been passed with the stools?      Intermittent  4. ONSET: When was the blood first seen in the stools? (Days or weeks)      1 week ago  5. DIARRHEA: Is there also some diarrhea? If Yes, ask: How many diarrhea stools in the past 24 hours?      No 6. CONSTIPATION: Do you have constipation? If Yes, ask: How bad is it?     Some constipation  7. RECURRENT SYMPTOMS: Have you had blood in your stools before? If Yes, ask: When was the last time? and What happened that time?      Bleeding in the past with hemorrhoids, believes that is the cause of her current bleeding  8. BLOOD THINNERS: Do you take any blood thinners? (e.g., aspirin, clopidogrel / Plavix, coumadin, heparin). Notes: Other strong blood thinners include: Arixtra (fondaparinux), Eliquis  (apixaban ), Pradaxa (dabigatran), and Xarelto (rivaroxaban).     Was taking Eliquis  after surgery but is not currently  9. OTHER SYMPTOMS: Do you have any other  symptoms?  (e.g., abdomen pain, vomiting, dizziness, fever)     No  Protocols used: Rectal Bleeding-A-AH

## 2024-02-24 NOTE — Telephone Encounter (Signed)
 Aware, will watch for correspondence Thanks for seeing her

## 2024-02-25 ENCOUNTER — Ambulatory Visit (INDEPENDENT_AMBULATORY_CARE_PROVIDER_SITE_OTHER): Admitting: Physical Therapy

## 2024-02-25 ENCOUNTER — Encounter: Payer: Self-pay | Admitting: Physical Therapy

## 2024-02-25 DIAGNOSIS — M25662 Stiffness of left knee, not elsewhere classified: Secondary | ICD-10-CM

## 2024-02-25 DIAGNOSIS — R600 Localized edema: Secondary | ICD-10-CM

## 2024-02-25 DIAGNOSIS — M6281 Muscle weakness (generalized): Secondary | ICD-10-CM | POA: Diagnosis not present

## 2024-02-25 DIAGNOSIS — M25562 Pain in left knee: Secondary | ICD-10-CM | POA: Diagnosis not present

## 2024-02-25 DIAGNOSIS — R2689 Other abnormalities of gait and mobility: Secondary | ICD-10-CM | POA: Diagnosis not present

## 2024-02-25 NOTE — Therapy (Signed)
 OUTPATIENT PHYSICAL THERAPY TREATMENT   Patient Name: Makayla [name] MRN: [id number] DOB:24-Nov-1960, 63 y.o., female Today's Date: 02/25/2024  END OF SESSION:  PT End of Session - 02/25/24 1312     Visit Number 7    Number of Visits 16    Date for PT Re-Evaluation 03/31/24    Authorization Type [employer] $75 copay    Authorization - Number of Visits 29    PT Start Time 1307    PT Stop Time 1345    PT Time Calculation (min) 38 min    Activity Tolerance Patient tolerated treatment well    Behavior During Therapy WFL for tasks assessed/performed                Past Medical History:  Diagnosis Date   Allergy    pollen   Anxiety    Bursitis, trochanteric    bi/lat    Dental crowns present    x 4   Diverticulosis    Fatty liver    GERD (gastroesophageal reflux disease)    Hyperlipidemia    no current med.   Hypertension    white coat syndrome,   Mass of breast, left 01/2012   Obesity    Palpitations    PONV (postoperative nausea and vomiting)    Pre-diabetes    Pre-diabetes    Vitamin D  deficiency    Past Surgical History:  Procedure Laterality Date   ABDOMINAL HYSTERECTOMY     partial   ANTERIOR AND POSTERIOR REPAIR WITH SACROSPINOUS FIXATION N/A 08/20/2022   Procedure: POSTERIOR REPAIR WITH PERINEORRHAPHY AND WITH  SACROSPINOUS FIXATION;  Surgeon: Marilynne Rosaline SAILOR, MD;  Location: [facility];  Service: Gynecology;  Laterality: N/A;   BREAST BIOPSY Right 08/04/2021   BREAST EXCISIONAL BIOPSY Left 2013   BREAST SURGERY  01/10/2012   lumpectomy - left (excisional biopsy benign)   CHOLECYSTECTOMY N/A 02/10/2016   Procedure: LAPAROSCOPIC CHOLECYSTECTOMY WITH INTRAOPERATIVE CHOLANGIOGRAM;  Surgeon: Deward Null III, MD;  Location: [facility];  Service: General;  Laterality: N/A;   COLONOSCOPY  04/06/2010   D Brodie, normal w/tics   RECTOCELE REPAIR  06/14/2011   cystocele repair, pt states Dr Marykay did not do rectocele and said she had to come  back later.   ROBOTIC ASSISTED LAPAROSCOPIC SACROCOLPOPEXY  06/14/2011   Procedure: ROBOTIC ASSISTED LAPAROSCOPIC SACROCOLPOPEXY;  Surgeon: Noretta Ferrara, MD;  Location: [facility];  Service: Urology;  Laterality: N/A;   TOTAL KNEE ARTHROPLASTY Left 01/06/2024   Procedure: LEFT TOTAL KNEE ARTHROPLASTY;  Surgeon: Jerri Kay HERO, MD;  Location: [facility];  Service: Orthopedics;  Laterality: Left;   TUBAL LIGATION     Patient Active Problem List   Diagnosis Date Noted   Status post total left knee replacement 01/06/2024   Pre-operative clearance 09/02/2023   Obesity due to excess calories 03/28/2023   Primary osteoarthritis of left knee 11/28/2022   Bilateral knee pain 11/23/2022   Hepatic steatosis 11/07/2022   GAD (generalized anxiety disorder) 10/24/2022   Pre-diabetes 10/24/2022   Other hyperlipidemia 10/24/2022   Depression screen 10/24/2022   Hemorrhoids 06/07/2022   Abdominal pain, left upper quadrant 02/01/2022   Rib pain on left side 06/13/2020   Colon cancer screening 05/06/2020   Insomnia, psychophysiological 03/14/2020   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   Grief reaction 10/03/2018   Generalized anxiety disorder 01/23/2017   Vitamin D  deficiency 05/20/2015   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   Morbid obesity (HCC) with  starting BMI 37 10/25/2011   Routine general medical examination at a health care facility 10/16/2011   Hyperlipidemia LDL goal <130 12/04/2007   GERD 12/04/2007   Essential hypertension 12/04/2007    PCP: Randeen Laine LABOR, MD  REFERRING PROVIDER: Jule Ronal CROME, PA-C  REFERRING DIAG: [phone] (ICD-10-CM) - Status post total left knee replacement  Rationale for Evaluation and Treatment: Rehabilitation  THERAPY DIAG:  Acute pain of left knee  Stiffness of left knee, not elsewhere classified  Muscle weakness (generalized)  Other abnormalities of gait and mobility  Localized edema  ONSET DATE: DOS:  01/06/24   SUBJECTIVE:  SUBJECTIVE STATEMENT:  No pain just tightness and a little swelling to report   PERTINENT HISTORY:  HLD, HTN, and GERD  PAIN:  Are you having pain?  0/10 at rest, 5/10 with prolonged activity.  Pain location: Lt knee Pain description: aching Aggravating factors: stiff  Relieving factors: walking, ice, pain medication  PRECAUTIONS:  None  RED FLAGS: None   WEIGHT BEARING RESTRICTIONS:  No  FALLS:  Has patient fallen in last 6 months? 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, out to dinner with family, walking for exercise  PATIENT GOALS:  Shower independently, return to walking, driving, housework   OBJECTIVE:  Note: Objective measures were completed at Evaluation unless otherwise noted.   PATIENT SURVEYS:  Patient-Specific Activity Scoring Scheme  0 represents "unable to perform." 10 represents "able to perform at prior level. 0 1 2 3 4 5 6 7 8 9  10 (Date and Score)   Activity Eval     1. Walking 6    2. Driving  0    3. Housework 5   Score 3.67    Total score = sum of the activity scores/number of activities Minimum detectable change (90%CI) for average score = 2 points Minimum detectable change (90%CI) for single activity score = 3 points    COGNITIVE STATUS: Within functional limits for tasks assessed    GAIT: 02/04/24 Distance walked: 100' within [employer] device utilized: Single point cane (did carry in hand more than use) Level of assistance: Modified independence Comments: decreased stance on Lt, decr hip/knee flexion on Lt   EDEMA: 02/04/24  Knee joint line: Rt: 48.8 cm/Lt: 57.2 cm   LOWER EXTREMITY ROM:      ROM Right eval Left eval Left 02/10/24  Knee flexion A: 123 A: 103 P: 105 P: 110  Knee extension A: 7 (hyperext) A: -2 (seated LAQ) P: 0    (Blank rows = not tested)   LOWER EXTREMITY MMT:   02/04/24: Not formally tested LLE, grossly 3-/5   MMT Right 02/17/24 Left 02/17/24  Knee flexion 5/5 4-/5  Knee extension HH dynameter 62.9#, 63.2#  HH dynameter 35.2#, 33.3# LLE: RLE 54%    (Blank rows = not tested)  FUNCTIONAL TESTS:  02/04/24 5 times sit to stand: 16.00 sec without UE support    TODAY'S TREATMENT:  DATE: 02/25/24 TherEx Recumbent bike L5 x 8 min; seat 5 Seated SLR 2x10  TherAct Calf raises 2x10 with 5 sec hold Standing hip abduction with L4 band 2x10 bil Standing hip extension with L4 band 2x10 bil Squats 2x10 with UE support Forward step ups onto 6 step 2x10 bil without UE support Lateral step ups onto 6 step 2x10 bil with intermittent UE support Sit to/from stand 2x10 with 12# KB  02/21/24 TherEx NuStep L5 x 8 min; LEs only Knee extension LLE only 5# 2x10  TherAct Leg press bil 125#, 3x10; then LLE only 37# 3x10 Step up and over leading with LLE 2x10; 0-1 UE support needed Forward step ups 2x10 bil on 6 step with intermittent UE support needed Negotiated stairs alternating working on decreasing reliance on UEs; intermittent UE support needed TRX squats 2x10 - min cues for technique  Vaso BLE elevated, 10 min, 34 degree, medium compression to Lt knee  02/17/2024  TherEx: Bike, seat 5, level 3, for 8 minutes  Step up/downs using LLE, 2x10, step-through pattern with R UE assist  LLE Gastroc step stretch, 3x30 seconds BLE Heel raises, BL, 2x10 Standing LLE Quad stretch w/ towel, 3x30 seconds PT updated HEP to include above 3 exercises including HO, demo & verba cues.  Pt verbalized understanding after performing in clinic.   TherAct: BL leg press, 125#, 3x10, cues to bring toes into DF  for full knee extension LLE leg press, 43#, 1x10 See objective data for MMT  Vaso  LLE with elevation, 10 min, 38 degree, medium compression   02/14/24  Scifit bike seat 9 with seat progressions as tolerated full rotations x10 min for ROM and w/u  Tandem stance 3x30 seconds B solid surface   Self knee flexion stretch 12x5 seconds from high mat table  LAQs 5.5# 2x10 L  Bridges with progressive knee flexion 3x5   Retrograde massage in elevation for edema control Tennis ball massage to quad    02/10/24:  TherEx Bike seat 5 initial back and forth to warm up knee flexion on L LE, start backwards for 3 minutes, forward for 5 minutes. Educated about how to adjust seat on the bike to for proper mechanics. PT advised to get access to a stationary bike to continue program at home and build endurance. Patient verbalized understanding. Seated, LAQ & active knee flexion with contralateral LE opposing motion 10 reps  Therapeutic Activities: BL leg press at 100#, 2x10; PT cues for max knee flexion & ext.  L LE leg press at 37#, 1x10 Balance in tandem with each LE leading, 30 seconds, on floor with eyes open & eyes closed, and eyes open on foam;  intermittent touch on sink / chair back.    Self-Care: PT educated verbal and demo sleeping position Tenting sheets off feet and Side-lying sleeping position with pillow between ankle and knees. Patient verbalized understanding    Manual Distraction, IR, with knee flexion overpressure  Suction Cup and instrument-assisted soft tissue technique for scar mobilization  Vaso BLE elevated, 10 min, 34 degree, medium compression   PATIENT EDUCATION:  Education details: HEP Person educated: Patient Education method: Explanation, Demonstration, and Handouts Education comprehension: verbalized understanding, returned demonstration, and needs further education  HOME EXERCISE PROGRAM: Access Code: [email or web address] URL:  [email or web address] Date: 02/25/2024 Prepared by: Corean Ku  Exercises - Supine Heel Slide with Strap  - 5-10 x daily - 7 x weekly - 1 sets - 10 reps - Sitting Knee Extension with Resistance  - 2-3  x daily - 7 x weekly - 1 sets - 10 reps - 5 sec hold - Seated Straight Leg Raise  - 2-3 x daily - 7 x weekly - 1 sets - 10 reps - 5 sec hold - Sit to Stand  - 2-3 x daily - 7 x weekly - 1 sets - 10 reps - Tandem Stance in Corner  - 1 x daily - 7 x weekly - 1 sets - 6 reps - 30 seconds  hold - Tandem Walking with Counter Support  - 1 x daily - 7 x weekly - 1 sets - 4-5 reps - Gastroc Stretch on Step  - 1-2 x daily - 7 x weekly - 1 sets - 3 reps - 30 seconds hold - Heel Raises with Counter Support  - 1 x daily - 7 x weekly - 2 sets - 10 reps - 3-5 seconds hold - Standing Quad Stretch with Towel and Arm Support  - 1-2 x daily - 7 x weekly - 1 sets - 3 reps - 30 seconds hold - Squat with Counter Support  - 1 x daily - 7 x weekly - 2 sets - 10 reps - Standing Hip Extension with Resistance at Ankles and Counter Support  - 1 x daily - 7 x weekly - 2 sets - 10 reps - Standing Hip Abduction with Resistance at Ankles and Counter Support  - 1 x daily - 7 x weekly - 2 sets - 10 reps   ASSESSMENT:  CLINICAL IMPRESSION:   Pt overall is doing very well with PT and reports improvement in functional activities as well especially stairs.  Continue skilled PT to ensure smooth transition to home and community fitness.   OBJECTIVE IMPAIRMENTS: Abnormal gait, decreased balance, decreased knowledge of use of DME, decreased mobility, difficulty walking, decreased ROM, decreased strength, hypomobility, and pain.    GOALS: Goals reviewed with patient? Yes  SHORT TERM GOALS: Target date: 03/03/2024  Independent with initial HEP Goal status: Ongoing 02/17/2024  2.  Lt knee AROM improved to 0-110 deg for improved function Goal status: Ongoing  02/17/2024   LONG TERM GOALS: Target date:  03/31/2024  Independent with final HEP Goal status: Ongoing 02/17/2024  2.  PSFS score improved by 3 points Goal status: Ongoing 02/17/2024  3.  Lt knee AROM improved to 0-115 deg for improved mobility Goal status: Ongoing 02/17/2024  4.  Report pain < 3/10 with standing and walking activities for improved function Goal status:  Ongoing 02/17/2024  5.  Amb without AD without significant gait deviations for improved community access Goal status:  Ongoing 02/17/2024  6.  Improve 5x STS to < 13 sec for improved functional strength Goal status:  Ongoing 02/17/2024   PLAN:  PT FREQUENCY: 2x/week  PT DURATION: 8 weeks  PLANNED INTERVENTIONS: 97164- PT Re-evaluation, 97750- Physical Performance Testing, 97110-Therapeutic exercises, 97530- Therapeutic activity, 97112- Neuromuscular re-education, 97535- Self Care, 02859- Manual therapy, [phone]- Gait training, [phone]- Aquatic Therapy, [phone]- Electrical stimulation (unattended), 97016- Vasopneumatic device, [id number]- Ultrasound, 79439 (1-2 muscles), 20561 (3+ muscles)- Dry Needling, Patient/Family education, Balance training, Stair training, Taping, Joint mobilization, Cryotherapy, and Moist heat.  PLAN FOR NEXT SESSION: likely d/c next visit,   stair training with Attempt step-through pattern, continue with knee flexion/extension and strengthening exercises.    NEXT MD VISIT: 03/31/24   Corean JULIANNA Ku, PT, DPT 02/25/24 2:32 PM

## 2024-02-27 ENCOUNTER — Ambulatory Visit (INDEPENDENT_AMBULATORY_CARE_PROVIDER_SITE_OTHER): Admitting: Physical Therapy

## 2024-02-27 ENCOUNTER — Encounter: Payer: Self-pay | Admitting: Physical Therapy

## 2024-02-27 DIAGNOSIS — M25662 Stiffness of left knee, not elsewhere classified: Secondary | ICD-10-CM | POA: Diagnosis not present

## 2024-02-27 DIAGNOSIS — M6281 Muscle weakness (generalized): Secondary | ICD-10-CM

## 2024-02-27 DIAGNOSIS — R600 Localized edema: Secondary | ICD-10-CM

## 2024-02-27 DIAGNOSIS — M25562 Pain in left knee: Secondary | ICD-10-CM | POA: Diagnosis not present

## 2024-02-27 DIAGNOSIS — R2689 Other abnormalities of gait and mobility: Secondary | ICD-10-CM | POA: Diagnosis not present

## 2024-02-27 NOTE — Therapy (Signed)
 OUTPATIENT PHYSICAL THERAPY TREATMENT   Patient Name: Makayla [name] MRN: [id number] DOB:[birth year], 63 y.o., female Today's Date: 02/27/2024  END OF SESSION:  PT End of Session - 02/27/24 1356     Visit Number 8    Authorization Type [employer] $75 copay    Authorization - Number of Visits 29    PT Start Time 1353    PT Stop Time 1410    PT Time Calculation (min) 17 min    Activity Tolerance Patient tolerated treatment well    Behavior During Therapy WFL for tasks assessed/performed                 Past Medical History:  Diagnosis Date   Allergy    pollen   Anxiety    Bursitis, trochanteric    bi/lat    Dental crowns present    x 4   Diverticulosis    Fatty liver    GERD (gastroesophageal reflux disease)    Hyperlipidemia    no current med.   Hypertension    white coat syndrome,   Mass of breast, left 01/2012   Obesity    Palpitations    PONV (postoperative nausea and vomiting)    Pre-diabetes    Pre-diabetes    Vitamin D  deficiency    Past Surgical History:  Procedure Laterality Date   ABDOMINAL HYSTERECTOMY     partial   ANTERIOR AND POSTERIOR REPAIR WITH SACROSPINOUS FIXATION N/A 08/20/2022   Procedure: POSTERIOR REPAIR WITH PERINEORRHAPHY AND WITH  SACROSPINOUS FIXATION;  Surgeon: Marilynne Rosaline SAILOR, MD;  Location: [facility];  Service: Gynecology;  Laterality: N/A;   BREAST BIOPSY Right 08/04/2021   BREAST EXCISIONAL BIOPSY Left 2013   BREAST SURGERY  01/10/2012   lumpectomy - left (excisional biopsy benign)   CHOLECYSTECTOMY N/A 02/10/2016   Procedure: LAPAROSCOPIC CHOLECYSTECTOMY WITH INTRAOPERATIVE CHOLANGIOGRAM;  Surgeon: Deward Null III, MD;  Location: [facility];  Service: General;  Laterality: N/A;   COLONOSCOPY  04/06/2010   D Brodie, normal w/tics   RECTOCELE REPAIR  06/14/2011   cystocele repair, pt states Dr Marykay did not do rectocele and said she had to come back later.   ROBOTIC ASSISTED LAPAROSCOPIC SACROCOLPOPEXY   06/14/2011   Procedure: ROBOTIC ASSISTED LAPAROSCOPIC SACROCOLPOPEXY;  Surgeon: Noretta Ferrara, MD;  Location: [facility];  Service: Urology;  Laterality: N/A;   TOTAL KNEE ARTHROPLASTY Left 01/06/2024   Procedure: LEFT TOTAL KNEE ARTHROPLASTY;  Surgeon: Jerri Kay HERO, MD;  Location: [facility];  Service: Orthopedics;  Laterality: Left;   TUBAL LIGATION     Patient Active Problem List   Diagnosis Date Noted   Status post total left knee replacement 01/06/2024   Pre-operative clearance 09/02/2023   Obesity due to excess calories 03/28/2023   Primary osteoarthritis of left knee 11/28/2022   Bilateral knee pain 11/23/2022   Hepatic steatosis 11/07/2022   GAD (generalized anxiety disorder) 10/24/2022   Pre-diabetes 10/24/2022   Other hyperlipidemia 10/24/2022   Depression screen 10/24/2022   Hemorrhoids 06/07/2022   Abdominal pain, left upper quadrant 02/01/2022   Rib pain on left side 06/13/2020   Colon cancer screening 05/06/2020   Insomnia, psychophysiological 03/14/2020   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   Grief reaction 10/03/2018   Generalized anxiety disorder 01/23/2017   Vitamin D  deficiency 05/20/2015   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   Morbid obesity (HCC) with starting BMI 37 10/25/2011   Routine general medical examination at a health care  facility 10/16/2011   Hyperlipidemia LDL goal <130 12/04/2007   GERD 12/04/2007   Essential hypertension 12/04/2007    PCP: Randeen Laine LABOR, MD  REFERRING PROVIDER: Jule Ronal CROME, PA-C  REFERRING DIAG: [phone] (ICD-10-CM) - Status post total left knee replacement  Rationale for Evaluation and Treatment: Rehabilitation  THERAPY DIAG:  Acute pain of left knee  Stiffness of left knee, not elsewhere classified  Muscle weakness (generalized)  Other abnormalities of gait and mobility  Localized edema  ONSET DATE: DOS: 01/06/24   SUBJECTIVE:  SUBJECTIVE STATEMENT:  Feels good today and  ready to d/c   PERTINENT HISTORY:  HLD, HTN, and GERD  PAIN:  Are you having pain?  0/10 at rest, 5/10 with prolonged activity.  Pain location: Lt knee Pain description: aching Aggravating factors: stiff  Relieving factors: walking, ice, pain medication  PRECAUTIONS:  None  RED FLAGS: None   WEIGHT BEARING RESTRICTIONS:  No  FALLS:  Has patient fallen in last 6 months? 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, out to dinner with family, walking for exercise  PATIENT GOALS:  Shower independently, return to walking, driving, housework   OBJECTIVE:  Note: Objective measures were completed at Evaluation unless otherwise noted.   PATIENT SURVEYS:  Patient-Specific Activity Scoring Scheme  0 represents "unable to perform." 10 represents "able to perform at prior level. 0 1 2 3 4 5 6 7 8 9  10 (Date and Score)   Activity Eval   02/27/24  1. Walking 6 10   2. Driving  0  10  3. Housework 5 7  Score 3.67 9   Total score = sum of the activity scores/number of activities Minimum detectable change (90%CI) for average score = 2 points Minimum detectable change (90%CI) for single activity score = 3 points    COGNITIVE STATUS: Within functional limits for tasks assessed    GAIT: 02/04/24 Distance walked: 100' within [employer] device utilized: Single point cane (did carry in hand more than use) Level of assistance: Modified independence Comments: decreased stance on Lt, decr hip/knee flexion on Lt   EDEMA: 02/04/24  Knee joint line: Rt: 48.8 cm/Lt: 57.2 cm   LOWER EXTREMITY ROM:     ROM Right eval Left eval Left 02/10/24 Left 02/27/24  Knee flexion A: 123  A: 103 P: 105 P: 110 A: 117  Knee extension A: 7 (hyperext) A: -2 (seated LAQ) P: 0  A: 0 (seated LAQ)   (Blank rows = not tested)   LOWER EXTREMITY MMT:   02/04/24: Not formally tested LLE, grossly 3-/5   MMT Right 02/17/24 Left 02/17/24  Knee flexion 5/5 4-/5  Knee extension HH dynameter 62.9#, 63.2#  HH dynameter 35.2#, 33.3# LLE: RLE 54%    (Blank rows = not tested)  FUNCTIONAL TESTS:  02/04/24 5 times sit to stand: 16.00 sec without UE support  02/27/24: 5x STS: 11.75 sec without UE support    TODAY'S TREATMENT:  DATE: 02/27/24 TherEx Recumbent bike L4 x 6 min; seat 5 ROM - see above for details 5x STS - see above for details Discussed current HEP and gradual transition from PT specific exercises to generalized strengthening program, encouraged to continue to maximize ROM at least once per day   02/25/24 TherEx Recumbent bike L5 x 8 min; seat 5 Seated SLR 2x10  TherAct Calf raises 2x10 with 5 sec hold Standing hip abduction with L4 band 2x10 bil Standing hip extension with L4 band 2x10 bil Squats 2x10 with UE support Forward step ups onto 6 step 2x10 bil without UE support Lateral step ups onto 6 step 2x10 bil with intermittent UE support Sit to/from stand 2x10 with 12# KB  02/21/24 TherEx NuStep L5 x 8 min; LEs only Knee extension LLE only 5# 2x10  TherAct Leg press bil 125#, 3x10; then LLE only 37# 3x10 Step up and over leading with LLE 2x10; 0-1 UE support needed Forward step ups 2x10 bil on 6 step with intermittent UE support needed Negotiated stairs alternating working on decreasing reliance on UEs; intermittent UE support needed TRX squats 2x10 - min cues for technique  Vaso BLE elevated, 10 min, 34 degree, medium compression to Lt knee    PATIENT EDUCATION:  Education details: HEP Person educated: Patient Education method: Programmer, multimedia, Facilities manager, and Handouts Education  comprehension: verbalized understanding, returned demonstration, and needs further education  HOME EXERCISE PROGRAM: Access Code: [email or web address] URL: [email or web address] Date: 02/25/2024 Prepared by: Corean Ku  Exercises - Supine Heel Slide with Strap  - 5-10 x daily - 7 x weekly - 1 sets - 10 reps - Sitting Knee Extension with Resistance  - 2-3 x daily - 7 x weekly - 1 sets - 10 reps - 5 sec hold - Seated Straight Leg Raise  - 2-3 x daily - 7 x weekly - 1 sets - 10 reps - 5 sec hold - Sit to Stand  - 2-3 x daily - 7 x weekly - 1 sets - 10 reps - Tandem Stance in Corner  - 1 x daily - 7 x weekly - 1 sets - 6 reps - 30 seconds  hold - Tandem Walking with Counter Support  - 1 x daily - 7 x weekly - 1 sets - 4-5 reps - Gastroc Stretch on Step  - 1-2 x daily - 7 x weekly - 1 sets - 3 reps - 30 seconds hold - Heel Raises with Counter Support  - 1 x daily - 7 x weekly - 2 sets - 10 reps - 3-5 seconds hold - Standing Quad Stretch with Towel and Arm Support  - 1-2 x daily - 7 x weekly - 1 sets - 3 reps - 30 seconds hold - Squat with Counter Support  - 1 x daily - 7 x weekly - 2 sets - 10 reps - Standing Hip Extension with Resistance at Ankles and Counter Support  - 1 x daily - 7 x weekly - 2 sets - 10 reps - Standing Hip Abduction with Resistance at Ankles and Counter Support  - 1 x daily - 7 x weekly - 2 sets - 10 reps   ASSESSMENT:  CLINICAL IMPRESSION:   Pt has met all goals and is ready for d/c from PT today.  Will d/c PT.    OBJECTIVE IMPAIRMENTS: Abnormal gait, decreased balance, decreased knowledge of use of DME, decreased mobility, difficulty walking, decreased ROM, decreased strength, hypomobility, and pain.    GOALS: Goals reviewed with patient?  Yes  SHORT TERM GOALS: Target date: 03/03/2024  Independent with initial HEP Goal status: MET 02/27/24  2.  Lt knee AROM improved to 0-110 deg for improved function Goal status: MET 02/27/24   LONG TERM GOALS:  Target date: 03/31/2024  Independent with final HEP Goal status: MET 02/27/24  2.  PSFS score improved by 3 points Goal status: MET 02/27/24  3.  Lt knee AROM improved to 0-115 deg for improved mobility Goal status: MET 02/27/24  4.  Report pain < 3/10 with standing and walking activities for improved function Goal status:  MET 02/27/24  5.  Amb without AD without significant gait deviations for improved community access Goal status:  MET 02/27/24  6.  Improve 5x STS to < 13 sec for improved functional strength Goal status:  MET 02/27/24   PLAN:  PT FREQUENCY: 2x/week  PT DURATION: 8 weeks  PLANNED INTERVENTIONS: 97164- PT Re-evaluation, 97750- Physical Performance Testing, 97110-Therapeutic exercises, 97530- Therapeutic activity, 97112- Neuromuscular re-education, 97535- Self Care, 02859- Manual therapy, [id number]- Gait training, [phone]- Aquatic Therapy, [phone]- Electrical stimulation (unattended), 97016- Vasopneumatic device, [id number]- Ultrasound, 79439 (1-2 muscles), 20561 (3+ muscles)- Dry Needling, Patient/Family education, Balance training, Stair training, Taping, Joint mobilization, Cryotherapy, and Moist heat.  PLAN FOR NEXT SESSION: d/c PT today  NEXT MD VISIT: 03/31/24   Corean JULIANNA Ku, PT, DPT 02/27/24 2:16 PM      PHYSICAL THERAPY DISCHARGE SUMMARY  Visits from Start of Care: 8  Current functional level related to goals / functional outcomes: See above   Remaining deficits: See above   Education / Equipment: HEP   Patient agrees to discharge. Patient goals were met. Patient is being discharged due to meeting the stated rehab goals.   Corean JULIANNA Ku, PT, DPT 02/27/24 2:16 PM  [facility] [address] [place], [place], [address] Phone: [phone]   Fax:  [phone]

## 2024-02-28 ENCOUNTER — Ambulatory Visit: Admitting: Primary Care

## 2024-02-28 ENCOUNTER — Encounter: Payer: Self-pay | Admitting: Primary Care

## 2024-02-28 VITALS — BP 134/82 | HR 98 | Temp 97.7°F | Ht 63.0 in | Wt 214.0 lb

## 2024-02-28 DIAGNOSIS — K625 Hemorrhage of anus and rectum: Secondary | ICD-10-CM | POA: Diagnosis not present

## 2024-02-28 NOTE — Progress Notes (Signed)
 Subjective:    Patient ID: Makayla [name], female    DOB: Jan 13, 1961, 63 y.o.   MRN: [id number]  Rectal Bleeding  Pertinent negatives include no diarrhea and no rectal pain.    Makayla [name] is a very pleasant 63 y.o. female patient of Dr. Randeen with a history of hemorrhoids, GERD, hepatic steatosis, obesity who presents today to discuss rectal bleeding.  Symptom onset 2 week ago with bright red rectal bleeding for which was noticed on the toilet paper after wiping from having a bowel movement. She noticed the bleeding once daily for 5 days  with a bowel movement.   She was managed on Eliquis  2.5 mg BID since her left knee replacement in early June 2025. She completed the Eliquis  in early July and then began taking 81 mg of aspirin daily. Last week she took 800 mg of Ibuprofen  once daily for 3 consecutive days prior to bleeding onset due to her knee pain.  She followed up with her surgeon on 02/18/24 who told her to stop taking aspirin and Ibuprofen . She has been taking a stool softener after her surgery and Miralax  since her rectal bleeding began. She denies constipation and straining with movements.   She's been using Anusol  suppositories from an old prescription from PCP last week, last episode of bleeding was three days ago with light pink bleeding.   She denies rectal pain, more so like pressure. She has been more sedentary since her knee replacement. She denies feeling dizzy. She is here because her surgeon told her to do so.   Review of Systems  Gastrointestinal:  Positive for blood in stool and hematochezia. Negative for constipation, diarrhea and rectal pain.  Neurological:  Negative for dizziness.         Past Medical History:  Diagnosis Date   Allergy    pollen   Anxiety    Bursitis, trochanteric    bi/lat    Dental crowns present    x 4   Diverticulosis    Fatty liver    GERD (gastroesophageal reflux disease)    Hyperlipidemia    no current med.   Hypertension     white coat syndrome,   Mass of breast, left 01/2012   Obesity    Palpitations    PONV (postoperative nausea and vomiting)    Pre-diabetes    Pre-diabetes    Vitamin D  deficiency     Social History   Socioeconomic History   Marital status: Widowed    Spouse name: Not on file   Number of children: 1   Years of education: Not on file   Highest education level: Associate degree: academic program  Occupational History   Occupation: RN-retired  Tobacco Use   Smoking status: Never    Passive exposure: Past   Smokeless tobacco: Never  Vaping Use   Vaping status: Never Used  Substance and Sexual Activity   Alcohol use: No    Alcohol/week: 0.0 standard drinks of alcohol   Drug use: No   Sexual activity: Not Currently    Birth control/protection: Post-menopausal    Comment: has used estradiol  patch 0.025  in the past, has stopped  Other Topics Concern   Not on file  Social History Narrative   Not on file   Social Drivers of Health   Financial Resource Strain: Low Risk  (08/30/2023)   Overall Financial Resource Strain (CARDIA)    Difficulty of Paying Living Expenses: Not hard at all  Food Insecurity: No  Food Insecurity (08/30/2023)   Hunger Vital Sign    Worried About Running Out of Food in the Last Year: Never true    Ran Out of Food in the Last Year: Never true  Transportation Needs: No Transportation Needs (08/30/2023)   PRAPARE - Administrator, Civil Service (Medical): No    Lack of Transportation (Non-Medical): No  Physical Activity: Insufficiently Active (08/30/2023)   Exercise Vital Sign    Days of Exercise per Week: 2 days    Minutes of Exercise per Session: 20 min  Stress: No Stress Concern Present (08/30/2023)   [employer] of Occupational Health - Occupational Stress Questionnaire    Feeling of Stress : Not at all  Social Connections: Moderately Integrated (08/30/2023)   Social Connection and Isolation Panel    Frequency of Communication with  Friends and Family: More than three times a week    Frequency of Social Gatherings with Friends and Family: More than three times a week    Attends Religious Services: More than 4 times per year    Active Member of [employer] or Organizations: Yes    Attends Banker Meetings: More than 4 times per year    Marital Status: Widowed  Catering manager Violence: Not on file    Past Surgical History:  Procedure Laterality Date   ABDOMINAL HYSTERECTOMY     partial   ANTERIOR AND POSTERIOR REPAIR WITH SACROSPINOUS FIXATION N/A 08/20/2022   Procedure: POSTERIOR REPAIR WITH PERINEORRHAPHY AND WITH  SACROSPINOUS FIXATION;  Surgeon: Marilynne Rosaline SAILOR, MD;  Location: [facility];  Service: Gynecology;  Laterality: N/A;   BREAST BIOPSY Right 08/04/2021   BREAST EXCISIONAL BIOPSY Left 2013   BREAST SURGERY  01/10/2012   lumpectomy - left (excisional biopsy benign)   CHOLECYSTECTOMY N/A 02/10/2016   Procedure: LAPAROSCOPIC CHOLECYSTECTOMY WITH INTRAOPERATIVE CHOLANGIOGRAM;  Surgeon: Deward Null III, MD;  Location: [facility];  Service: General;  Laterality: N/A;   COLONOSCOPY  04/06/2010   D Brodie, normal w/tics   RECTOCELE REPAIR  06/14/2011   cystocele repair, pt states Dr Marykay did not do rectocele and said she had to come back later.   ROBOTIC ASSISTED LAPAROSCOPIC SACROCOLPOPEXY  06/14/2011   Procedure: ROBOTIC ASSISTED LAPAROSCOPIC SACROCOLPOPEXY;  Surgeon: Noretta Ferrara, MD;  Location: [facility];  Service: Urology;  Laterality: N/A;   TOTAL KNEE ARTHROPLASTY Left 01/06/2024   Procedure: LEFT TOTAL KNEE ARTHROPLASTY;  Surgeon: Jerri Kay HERO, MD;  Location: [facility];  Service: Orthopedics;  Laterality: Left;   TUBAL LIGATION      Family History  Problem Relation Age of Onset   Hypertension Mother    Hyperlipidemia Mother    Diabetes Mother    Breast cancer Mother 73   Uterine cancer Mother    GER disease Father    Depression Sister    Heart disease Brother    Heart attack  Maternal Grandmother    Hypertension Maternal Grandmother    Colon cancer Neg Hx    Colon polyps Neg Hx    Esophageal cancer Neg Hx    Rectal cancer Neg Hx    Stomach cancer Neg Hx     Allergies  Allergen Reactions   Buspar  [Buspirone ] Anxiety    Made her more anxious    Current Outpatient Medications on File Prior to Visit  Medication Sig Dispense Refill   acidophilus (RISAQUAD) CAPS capsule Take 1 capsule by mouth daily.     amLODipine  (NORVASC ) 2.5 MG tablet Take 1  tablet (2.5 mg total) by mouth daily. 90 tablet 3   apixaban  (ELIQUIS ) 2.5 MG TABS tablet Take 1 tablet (2.5 mg total) by mouth 2 (two) times daily for 30 days after surgery to prevent blood clots (Patient not taking: Reported on 02/28/2024) 60 tablet 0   b complex vitamins capsule Take 1 capsule by mouth daily.     docusate sodium  (COLACE) 100 MG capsule Take 1 capsule (100 mg total) by mouth daily as needed. 30 capsule 2   esomeprazole  (NEXIUM ) 20 MG capsule Take 20 mg by mouth 2 (two) times daily before a meal.     estradiol  (ESTRACE ) 0.1 MG/GM vaginal cream Place 0.5 g vaginally 2 (two) times a week. Place 0.5g nightly for two weeks then twice a week after 30 g 11   HYDROcodone -acetaminophen  (NORCO/VICODIN) 5-325 MG tablet Take 1 tablet by mouth 3 (three) times daily as needed for moderate pain (pain score 4-6). 30 tablet 0   HYDROmorphone  (DILAUDID ) 2 MG tablet Take 1 tablet (2 mg total) by mouth 3 (three) times daily as needed for severe pain (pain score 7-10). To be taken for breakthrough pain (Patient not taking: Reported on 02/28/2024) 15 tablet 0   loratadine (CLARITIN) 10 MG tablet Take 10 mg by mouth daily as needed for allergies.     metFORMIN  (GLUCOPHAGE ) 500 MG tablet Take 1 tablet (500 mg total) by mouth 2 (two) times daily with a meal. 180 tablet 0   methocarbamol  (ROBAXIN ) 750 MG tablet Take 1 tablet (750 mg total) by mouth 3 (three) times daily as needed for muscle spasms. 30 tablet 2   sertraline  (ZOLOFT )  25 MG tablet Take 1 tablet (25 mg total) by mouth daily. 90 tablet 3   Vitamin D , Cholecalciferol, 25 MCG (1000 UT) TABS Take 5,000 Units by mouth daily.     atorvastatin  (LIPITOR) 20 MG tablet Take 1 tablet (20 mg total) by mouth daily. (Patient not taking: Reported on 02/28/2024) 90 tablet 3   doxycycline  (VIBRA -TABS) 100 MG tablet Take 1 tablet (100 mg total) by mouth 2 (two) times daily. (Patient not taking: Reported on 02/28/2024) 28 tablet 0   fluticasone  (FLONASE ) 50 MCG/ACT nasal spray Place 2 sprays into both nostrils daily. (Patient not taking: Reported on 02/28/2024) 48 g 3   HYDROcodone -acetaminophen  (NORCO/VICODIN) 5-325 MG tablet Take 1 tablet by mouth every 6 (six) hours as needed for moderate pain (pain score 4-6). (Patient not taking: Reported on 02/28/2024) 30 tablet 0   methocarbamol  (ROBAXIN ) 750 MG tablet Take 1 tablet (750 mg total) by mouth 3 (three) times daily as needed. (Patient not taking: Reported on 02/28/2024) 30 tablet 2   ondansetron  (ZOFRAN ) 4 MG tablet Take 1 tablet (4 mg total) by mouth every 8 (eight) hours as needed for nausea or vomiting. (Patient not taking: Reported on 02/28/2024) 40 tablet 0   oxyCODONE -acetaminophen  (PERCOCET) 5-325 MG tablet Take 1 tablet by mouth every 6 (six) hours as needed. To be taken after surgery (Patient not taking: Reported on 02/28/2024) 40 tablet 0   oxyCODONE -acetaminophen  (PERCOCET) 5-325 MG tablet Take 1 tablet by mouth every 6 (six) hours as needed. (Patient not taking: Reported on 02/28/2024) 40 tablet 0   No current facility-administered medications on file prior to visit.    BP 134/82   Pulse 98   Temp 97.7 F (36.5 C) (Temporal)   Ht 5' 3 (1.6 m)   Wt 214 lb (97.1 kg)   SpO2 98%   BMI 37.91 kg/m  Objective:  Physical Exam Constitutional:      General: She is not in acute distress. Cardiovascular:     Rate and Rhythm: Normal rate.  Pulmonary:     Effort: Pulmonary effort is normal.  Genitourinary:    Rectum:  External hemorrhoid present.     Comments: 2 mm non thrombosed external hemorrhoid noted.  No bleeding.  Older anal tags also noted. Neurological:     Mental Status: She is alert.           Assessment & Plan:  Rectal bleeding Assessment & Plan: Resolved, likely from hemorrhoid.  Continue Anusol  HC suppositories twice daily as they seem to be effective. Remain off aspirin and ibuprofen  for now. Increase physical activity to avoid constipation and pressure to the anal region.  We discussed to notify if bleeding returns.  She agrees.          Lauren Modisette K Govanni Plemons, NP

## 2024-02-28 NOTE — Patient Instructions (Signed)
 Remain off aspirin and ibuprofen  for now.  Increase physical activity.  Please notify us  if your symptoms return.  It was a pleasure meeting you!

## 2024-02-28 NOTE — Assessment & Plan Note (Addendum)
 Resolved, likely from hemorrhoid.  Continue Anusol  HC suppositories twice daily as they seem to be effective. Remain off aspirin and ibuprofen  for now. Increase physical activity to avoid constipation and pressure to the anal region.  We discussed to notify if bleeding returns.  She agrees.

## 2024-03-02 ENCOUNTER — Ambulatory Visit: Admitting: Family Medicine

## 2024-03-03 ENCOUNTER — Other Ambulatory Visit: Payer: Self-pay | Admitting: Physician Assistant

## 2024-03-03 ENCOUNTER — Encounter: Payer: Self-pay | Admitting: Orthopaedic Surgery

## 2024-03-03 ENCOUNTER — Encounter: Admitting: Physical Therapy

## 2024-03-03 MED ORDER — TRAMADOL HCL 50 MG PO TABS: 50.0000 mg | ORAL_TABLET | Freq: Two times a day (BID) | ORAL | 1 refills | Status: DC | PRN

## 2024-03-03 NOTE — Telephone Encounter (Signed)
 Sent in tramadol  to try

## 2024-03-05 ENCOUNTER — Encounter: Admitting: Physical Therapy

## 2024-03-05 ENCOUNTER — Other Ambulatory Visit: Payer: Self-pay | Admitting: Family Medicine

## 2024-03-05 DIAGNOSIS — K625 Hemorrhage of anus and rectum: Secondary | ICD-10-CM

## 2024-03-05 DIAGNOSIS — K649 Unspecified hemorrhoids: Secondary | ICD-10-CM

## 2024-03-05 NOTE — Telephone Encounter (Signed)
 Not on med list but will route to Mallie, NP who eval pt recently for rectal bleeding

## 2024-03-08 NOTE — Progress Notes (Unsigned)
 SUBJECTIVE: Discussed the use of AI scribe software for clinical note transcription with the patient, who gave verbal consent to proceed.  Chief Complaint: Obesity  Interim History: Makayla [name] is up 2 lbs since her last visit.  Makayla [name] underwent Left knee replacement surgery 01/06/24 by Dr. Jerri Ivanoff is here to discuss her progress with her obesity treatment plan. Makayla [name] is on the Category 2 Plan and states Makayla [name] is following her eating plan approximately 50 % of the time. Makayla [name] states Makayla [name] is exercising Doing PT exercises/walking  10 minutes 7 times per week. Makayla [name] is a 63 year old female with obesity who presents for follow-up regarding her obesity treatment plan.  Makayla [name] has been adhering to the category two nutrition plan about fifty percent of the time. During her recovery from knee replacement surgery on January 06, 2024, Makayla [name] gained two pounds but has an overall weight loss of eleven pounds. Makayla [name] is not consuming the recommended amount of protein, often skipping meals, and not eating enough whole foods. Makayla [name] feels adequately hydrated and reports increased thirst. Makayla [name] is not experiencing specific cravings and is attempting to refocus on her nutrition plan. Makayla [name] uses [employer] milk to help meet protein goal.  Makayla [name] is recovering from knee replacement surgery performed on January 06, 2024, and is participating in postoperative physical therapy. Makayla [name] walks ten thousand steps daily. Makayla [name] has difficulty sleeping due to pain related to her recovery. Makayla [name] has been taking Tylenol  for pain management and has discontinued Vicodin and Robaxin  since last Tuesday. Makayla [name] experiences nausea and uses Zofran  for relief.  Makayla [name] has a history of prediabetes and takes metformin  500 mg twice daily. Makayla [name] reports frequent loose stools since her surgery, which is unusual for her. Makayla [name] is also taking cholecalciferol 5000 units daily, B complex vitamins one capsule daily, Lipitor 20 mg daily for hyperlipidemia, Norvasc  2.5 mg, Nexium , Flonase , and Zoloft .  Makayla [name] completed a course of doxycycline  post-surgery due to stitch issues which may have also contributed to loose stools.  Makayla [name] is considering returning to work in the first week of September, possibly starting with half days. Makayla [name] is driving and engaging in activities such as grocery shopping and back-to-school shopping. Makayla [name] is trying to resume a normal routine and is thankful for her progress.  OBJECTIVE: Visit Diagnoses: Problem List Items Addressed This Visit     Pre-diabetes   Relevant Medications   metFORMIN  (GLUCOPHAGE ) 500 MG tablet   Status post total left knee replacement - Primary   Other Visit Diagnoses       Poor sleep         Generalized obesity- Start BMI 37.74       Relevant Medications   metFORMIN  (GLUCOPHAGE ) 500 MG tablet     BMI 37.0-37.9, adult Current BMI 37.2          Status post right knee replacement with postoperative pain Recovering from right knee replacement surgery on 01-06-2024 with postoperative pain affecting sleep. Discontinued OxyContin  and Vicodin at this point, managing pain with Tylenol .  Considering Robaxin  for muscle soreness and melatonin for sleep issues. - Consider Robaxin  for muscle soreness, possibly at bedtime to aid sleep. - Consider melatonin gummies for sleep issues. - Encourage use of ice packs for pain management. - Discuss [employer] Right Start program for safe exercise with Doctor Jerri and Manuelita Grave, PA-C at visit.  Poor sleep Insomnia likely related to postoperative pain and changes in sleep position. Difficulty sleeping on back and pain when sleeping on  left side. Discontinued Vicodin, possibly contributing to sleep disturbances. - Consider melatonin gummies to help with sleep. - Encourage exposure to early morning sunlight to reset circadian rhythm. - Consider using Robaxin  at bedtime if muscle soreness contributes to sleep disturbances.  Obesity Ongoing obesity management. Following category two nutrition plan 50% of  the time. Slight weight gain of 2 pounds during recovery, overall 11 pounds down. Inadequate protein intake and meal skipping following surgery. Adequate hydration but insufficient whole food consumption. - Provide grocery list and nutrition plan for category 2 to refocus on dietary goals. - Encourage prioritization of protein intake, possibly using protein shakes like Core Power or Premier if not meeting protein needs consistently. - Encourage continued hydration. - Discuss [employer] Right Start program for safe exercise post-knee replacement.  Prediabetes On metformin  500 mg twice daily. Reports increased bathroom frequency post-surgery, possibly related to metformin  or stool softener use- Colace. Lab Results  Component Value Date   HGBA1C 6.1 06/21/2023   HGBA1C 5.9 (H) 02/28/2023   HGBA1C 6.0 (H) 10/24/2022   Lab Results  Component Value Date   LDLCALC 147 (H) 06/21/2023   CREATININE 0.80 12/27/2023   Continue working on nutrition plan to decrease simple carbohydrates, increase lean proteins and exercise to promote weight loss, improve glycemic control and prevent progression to Type 2 diabetes.  - Reduce metformin  to once daily to see if symptoms improve . If improved, resume BID dosing if tolerated. - Stop stool softener- Colace to assess impact on bowel movements and monitor .   General Health Maintenance Focus on adequate protein intake, hydration, and safe exercise post-knee replacement. - Encourage prioritization of protein intake and hydration. - Discuss safe exercise options post-knee replacement, including the Right Start program. Vitals Temp: 97.7 F (36.5 C) BP: 113/67 Pulse Rate: 91 SpO2: 96 %   Anthropometric Measurements Height: 5' 3 (1.6 m) Weight: 209 lb (94.8 kg) BMI (Calculated): 37.03 Weight at Last Visit: 207 LB Weight Lost Since Last Visit: 0 Weight Gained Since Last Visit: 2 lb Starting Weight: 220 LB Total Weight Loss (lbs): 11 lb (4.99  kg)   Body Composition  Body Fat %: 48 % Fat Mass (lbs): 100.8 lbs Muscle Mass (lbs): 103.4 lbs Total Body Water (lbs): 78.4 lbs Visceral Fat Rating : 15   Other Clinical Data Fasting: NO Labs: NO Today's Visit #: 14 Starting Date: 10/24/22     ASSESSMENT AND PLAN:  Diet: Makayla [name] is currently in the action stage of change. As such, her goal is to continue with weight loss efforts. Makayla [name] has agreed to Category 2 Plan.  Exercise: Makayla [name] has been instructed consider [employer] Right start program to initiate safe work out program for weight loss and overall health benefits.   Behavior Modification:  We discussed the following Behavioral Modification Strategies today: increasing lean protein intake, decreasing simple carbohydrates, increasing vegetables, increase H2O intake, increase high fiber foods, no skipping meals, avoiding temptations, and planning for success. We discussed various medication options to help Makayla [name] with her weight loss efforts and we both agreed to continue current treatment plan.  Return in about 6 weeks (around 04/20/2024).Makayla [name] Makayla [name] was informed of the importance of frequent follow up visits to maximize her success with intensive lifestyle modifications for her multiple health conditions.  Attestation Statements:   Reviewed by clinician on day of visit: allergies, medications, problem list, medical history, surgical history, family history, social history, and previous encounter notes.   Time spent on visit including pre-visit chart review and  post-visit care and charting was 42 minutes.    Jedediah Noda, PA-C

## 2024-03-09 ENCOUNTER — Ambulatory Visit (INDEPENDENT_AMBULATORY_CARE_PROVIDER_SITE_OTHER): Admitting: Physician Assistant

## 2024-03-09 VITALS — BP 113/67 | HR 91 | Temp 97.7°F | Ht 63.0 in | Wt 209.0 lb

## 2024-03-09 DIAGNOSIS — Z72820 Sleep deprivation: Secondary | ICD-10-CM

## 2024-03-09 DIAGNOSIS — E669 Obesity, unspecified: Secondary | ICD-10-CM | POA: Diagnosis not present

## 2024-03-09 DIAGNOSIS — Z96652 Presence of left artificial knee joint: Secondary | ICD-10-CM | POA: Diagnosis not present

## 2024-03-09 DIAGNOSIS — R7303 Prediabetes: Secondary | ICD-10-CM | POA: Diagnosis not present

## 2024-03-09 DIAGNOSIS — I10 Essential (primary) hypertension: Secondary | ICD-10-CM

## 2024-03-09 DIAGNOSIS — E559 Vitamin D deficiency, unspecified: Secondary | ICD-10-CM

## 2024-03-09 DIAGNOSIS — E785 Hyperlipidemia, unspecified: Secondary | ICD-10-CM

## 2024-03-09 DIAGNOSIS — Z6837 Body mass index (BMI) 37.0-37.9, adult: Secondary | ICD-10-CM

## 2024-03-09 MED ORDER — METFORMIN HCL 500 MG PO TABS: 500.0000 mg | ORAL_TABLET | Freq: Two times a day (BID) | ORAL | 0 refills | Status: AC

## 2024-03-31 ENCOUNTER — Ambulatory Visit (INDEPENDENT_AMBULATORY_CARE_PROVIDER_SITE_OTHER): Admitting: Physician Assistant

## 2024-03-31 DIAGNOSIS — Z96652 Presence of left artificial knee joint: Secondary | ICD-10-CM

## 2024-03-31 NOTE — Progress Notes (Signed)
 Post-Op Visit Note   Patient: Makayla [name]           Date of Birth: 1960/08/18           MRN: [id number] Visit Date: 03/31/2024 PCP: Randeen Laine DELENA, MD   Assessment & Plan:  Chief Complaint:  Chief Complaint  Patient presents with   Left Knee - Routine Post Op   Visit Diagnoses:  1. Status post total left knee replacement     Plan: Patient is a pleasant 63 year old female who comes in today 3 months status post left total knee replacement 01/06/2024.  She has been doing okay.  She has finished outpatient physical therapy but notes continued stiffness to the left knee.  She continues to work on a home exercise program however.  She has been taking Tylenol  and Robaxin  as needed for pain.  Other issue she brings up today is that she has had a very low energy level.  She does tell me she has been unable to sleep well as she has a hard time getting comfortable.  Examination of her left knee reveals range of motion from 0 to approximately 120 degrees.  She stable to valgus varus stress.  She is neurovascularly intact distally.  At this point, she will continue to work on a home exercise program.  I have discussed trying her muscle relaxer at night or even trying melatonin for which she is agreeable to.  If this does not improve her energy level, she may follow-up with her PCP for further evaluation.  She will follow-up with us  in 3 months for repeat evaluation and 2 view x-rays of the left knee.  Call with concerns or questions.  Follow-Up Instructions: Return in about 3 months (around 07/01/2024).   Orders:  No orders of the defined types were placed in this encounter.  No orders of the defined types were placed in this encounter.   Imaging: No new imaging  PMFS History: Patient Active Problem List   Diagnosis Date Noted   Rectal bleeding 02/28/2024   Status post total left knee replacement 01/06/2024   Pre-operative clearance 09/02/2023   Obesity due to excess calories 03/28/2023    Primary osteoarthritis of left knee 11/28/2022   Bilateral knee pain 11/23/2022   Hepatic steatosis 11/07/2022   GAD (generalized anxiety disorder) 10/24/2022   Pre-diabetes 10/24/2022   Other hyperlipidemia 10/24/2022   Depression screen 10/24/2022   Hemorrhoids 06/07/2022   Abdominal pain, left upper quadrant 02/01/2022   Rib pain on left side 06/13/2020   Colon cancer screening 05/06/2020   Insomnia, psychophysiological 03/14/2020   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   Grief reaction 10/03/2018   Generalized anxiety disorder 01/23/2017   Vitamin D  deficiency 05/20/2015   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   Morbid obesity (HCC) with starting BMI 37 10/25/2011   Routine general medical examination at a health care facility 10/16/2011   Hyperlipidemia LDL goal <130 12/04/2007   GERD 12/04/2007   Essential hypertension 12/04/2007   Past Medical History:  Diagnosis Date   Allergy    pollen   Anxiety    Bursitis, trochanteric    bi/lat    Dental crowns present    x 4   Diverticulosis    Fatty liver    GERD (gastroesophageal reflux disease)    Hyperlipidemia    no current med.   Hypertension    white coat syndrome,   Mass of breast, left 01/2012   Obesity  Palpitations    PONV (postoperative nausea and vomiting)    Pre-diabetes    Pre-diabetes    Vitamin D  deficiency     Family History  Problem Relation Age of Onset   Hypertension Mother    Hyperlipidemia Mother    Diabetes Mother    Breast cancer Mother 18   Uterine cancer Mother    GER disease Father    Depression Sister    Heart disease Brother    Heart attack Maternal Grandmother    Hypertension Maternal Grandmother    Colon cancer Neg Hx    Colon polyps Neg Hx    Esophageal cancer Neg Hx    Rectal cancer Neg Hx    Stomach cancer Neg Hx     Past Surgical History:  Procedure Laterality Date   ABDOMINAL HYSTERECTOMY     partial   ANTERIOR AND POSTERIOR  REPAIR WITH SACROSPINOUS FIXATION N/A 08/20/2022   Procedure: POSTERIOR REPAIR WITH PERINEORRHAPHY AND WITH  SACROSPINOUS FIXATION;  Surgeon: Marilynne Rosaline SAILOR, MD;  Location: [facility];  Service: Gynecology;  Laterality: N/A;   BREAST BIOPSY Right 08/04/2021   BREAST EXCISIONAL BIOPSY Left 2013   BREAST SURGERY  01/10/2012   lumpectomy - left (excisional biopsy benign)   CHOLECYSTECTOMY N/A 02/10/2016   Procedure: LAPAROSCOPIC CHOLECYSTECTOMY WITH INTRAOPERATIVE CHOLANGIOGRAM;  Surgeon: Deward Null III, MD;  Location: [facility];  Service: General;  Laterality: N/A;   COLONOSCOPY  04/06/2010   D Brodie, normal w/tics   RECTOCELE REPAIR  06/14/2011   cystocele repair, pt states Dr Marykay did not do rectocele and said she had to come back later.   ROBOTIC ASSISTED LAPAROSCOPIC SACROCOLPOPEXY  06/14/2011   Procedure: ROBOTIC ASSISTED LAPAROSCOPIC SACROCOLPOPEXY;  Surgeon: Noretta Ferrara, MD;  Location: [facility];  Service: Urology;  Laterality: N/A;   TOTAL KNEE ARTHROPLASTY Left 01/06/2024   Procedure: LEFT TOTAL KNEE ARTHROPLASTY;  Surgeon: Jerri Kay HERO, MD;  Location: [facility];  Service: Orthopedics;  Laterality: Left;   TUBAL LIGATION     Social History   Occupational History   Occupation: RN-retired  Tobacco Use   Smoking status: Never    Passive exposure: Past   Smokeless tobacco: Never  Vaping Use   Vaping status: Never Used  Substance and Sexual Activity   Alcohol use: No    Alcohol/week: 0.0 standard drinks of alcohol   Drug use: No   Sexual activity: Not Currently    Birth control/protection: Post-menopausal    Comment: has used estradiol  patch 0.025  in the past, has stopped

## 2024-04-07 ENCOUNTER — Encounter: Payer: Self-pay | Admitting: Family Medicine

## 2024-04-07 ENCOUNTER — Ambulatory Visit: Payer: Self-pay | Admitting: Family Medicine

## 2024-04-07 ENCOUNTER — Ambulatory Visit: Admitting: Family Medicine

## 2024-04-07 VITALS — BP 131/80 | HR 93 | Temp 98.1°F | Ht 63.0 in | Wt 214.1 lb

## 2024-04-07 DIAGNOSIS — Z636 Dependent relative needing care at home: Secondary | ICD-10-CM | POA: Insufficient documentation

## 2024-04-07 DIAGNOSIS — I10 Essential (primary) hypertension: Secondary | ICD-10-CM | POA: Diagnosis not present

## 2024-04-07 DIAGNOSIS — R7303 Prediabetes: Secondary | ICD-10-CM

## 2024-04-07 DIAGNOSIS — Z79899 Other long term (current) drug therapy: Secondary | ICD-10-CM | POA: Insufficient documentation

## 2024-04-07 DIAGNOSIS — R42 Dizziness and giddiness: Secondary | ICD-10-CM | POA: Diagnosis not present

## 2024-04-07 DIAGNOSIS — Z23 Encounter for immunization: Secondary | ICD-10-CM

## 2024-04-07 DIAGNOSIS — E559 Vitamin D deficiency, unspecified: Secondary | ICD-10-CM

## 2024-04-07 DIAGNOSIS — R5382 Chronic fatigue, unspecified: Secondary | ICD-10-CM

## 2024-04-07 LAB — CBC WITH DIFFERENTIAL/PLATELET
Basophils Absolute: 0.1 K/uL (ref 0.0–0.1)
Basophils Relative: 0.6 % (ref 0.0–3.0)
Eosinophils Absolute: 0.1 K/uL (ref 0.0–0.7)
Eosinophils Relative: 1.6 % (ref 0.0–5.0)
HCT: 40.7 % (ref 36.0–46.0)
Hemoglobin: 13.2 g/dL (ref 12.0–15.0)
Lymphocytes Relative: 25.6 % (ref 12.0–46.0)
Lymphs Abs: 2.1 K/uL (ref 0.7–4.0)
MCHC: 32.3 g/dL (ref 30.0–36.0)
MCV: 78.1 fl (ref 78.0–100.0)
Monocytes Absolute: 0.7 K/uL (ref 0.1–1.0)
Monocytes Relative: 8.2 % (ref 3.0–12.0)
Neutro Abs: 5.4 K/uL (ref 1.4–7.7)
Neutrophils Relative %: 64 % (ref 43.0–77.0)
Platelets: 136 K/uL — ABNORMAL LOW (ref 150.0–400.0)
RBC: 5.22 Mil/uL — ABNORMAL HIGH (ref 3.87–5.11)
RDW: 15.3 % (ref 11.5–15.5)
WBC: 8.4 K/uL (ref 4.0–10.5)

## 2024-04-07 LAB — COMPREHENSIVE METABOLIC PANEL WITH GFR
ALT: 16 U/L (ref 0–35)
AST: 12 U/L (ref 0–37)
Albumin: 4.2 g/dL (ref 3.5–5.2)
Alkaline Phosphatase: 95 U/L (ref 39–117)
BUN: 12 mg/dL (ref 6–23)
CO2: 28 meq/L (ref 19–32)
Calcium: 9.5 mg/dL (ref 8.4–10.5)
Chloride: 101 meq/L (ref 96–112)
Creatinine, Ser: 0.87 mg/dL (ref 0.40–1.20)
GFR: 70.6 mL/min (ref >60.00)
Glucose, Bld: 107 mg/dL — ABNORMAL HIGH (ref 70–99)
Potassium: 4.1 meq/L (ref 3.5–5.1)
Sodium: 138 meq/L (ref 135–145)
Total Bilirubin: 0.3 mg/dL (ref 0.2–1.2)
Total Protein: 6.9 g/dL (ref 6.0–8.3)

## 2024-04-07 LAB — VITAMIN D 25 HYDROXY (VIT D DEFICIENCY, FRACTURES): VITD: 63.42 ng/mL (ref 30.00–100.00)

## 2024-04-07 LAB — HEMOGLOBIN A1C: Hgb A1c MFr Bld: 6.3 % (ref 4.6–6.5)

## 2024-04-07 LAB — TSH: TSH: 2.63 u[IU]/mL (ref 0.35–5.50)

## 2024-04-07 LAB — VITAMIN B12: Vitamin B-12: 455 pg/mL (ref 211–911)

## 2024-04-07 NOTE — Assessment & Plan Note (Signed)
 Caring for her mother which is difficult  She is difficult to please  Reviewed stressors/ coping techniques/symptoms/ support sources/ tx options and side effects in detail today  No doubt adding to her fatigue Also takes away from time for self care   Referral made for mental health counseling Continues sertraline  25 mg which has been very helpful

## 2024-04-07 NOTE — Patient Instructions (Addendum)
 Gradually increase your walking to 30 minutes total per day  You need strength training Add some strength training to your routine, this is important for bone and brain health and can reduce your risk of falls and help your body use insulin  properly and regulate weight  Light weights, exercise bands , and internet videos are a good way to start  Yoga (chair or regular), machines , floor exercises or a gym with machines are also good options   Make sure you get enough protein The following are examples of protein in diet  Meat  Fish  Eggs  Dairy products  Soy products  Oat milk  Almond milk Nuts and nut butters  Legumes  Dried beans   Labs today   Add flonase  back in season for headache and facial pressure   I put the referral in for mental health counseling  Please let us  know if you don't hear in 1-2 weeks to set that up [employer] message or call or letter)

## 2024-04-07 NOTE — Assessment & Plan Note (Signed)
 May be multi factorial (stress/ post operative from knee surgery/ less time for self care/ reduced appetite/ reduced exercise and possible muscle loss)  Also occational light headedness No changes on physical exam or vitals   Lab today  Plan to follow

## 2024-04-07 NOTE — Assessment & Plan Note (Signed)
D level ordered

## 2024-04-07 NOTE — Progress Notes (Signed)
 Subjective:    Patient ID: Makayla [name], female    DOB: 1960-11-15, 63 y.o.   MRN: [id number]  HPI  Wt Readings from Last 3 Encounters:  04/07/24 214 lb 2 oz (97.1 kg)  03/09/24 209 lb (94.8 kg)  02/28/24 214 lb (97.1 kg)   37.93 kg/m  Vitals:   04/07/24 1022 04/07/24 1048  BP: (!) 142/78 131/80  Pulse: 93   Temp: 98.1 F (36.7 C)   SpO2: 99%      Pt presents with c/o  Light headedness Fatigue    Had TKA in June  Unsure if she 100% ever got energy level back since then  It has been a tough summer   She had to take care of grand daughter for a week  Really wiped her out  Had episodes of light headedness   One day felt anxious (overwhelmed)  Overall a lot better than it used to be   Stress- her mother is driving her crazy/ trying to care for her  It is really tough   Goes to healthy weight clinic May not be eating enough/ less appetite  ? Enough protein   Exercise- walking driveway 10 min per day Needs to increase this to 3 times daily Did her PT  Has not mastered steps   Had a dull headache for a week  Tension - in forehead   Has had some pressure in sinuses   HTN bp is stable today  No cp or palpitations or headaches or edema  No side effects to medicines  BP Readings from Last 3 Encounters:  04/07/24 131/80  03/09/24 113/67  02/28/24 134/82    Amlodipine  2.5 mg daily   Lab Results  Component Value Date   NA 138 12/27/2023   K 4.3 12/27/2023   CO2 27 12/27/2023   GLUCOSE 95 12/27/2023   BUN 12 12/27/2023   CREATININE 0.80 12/27/2023   CALCIUM  9.5 12/27/2023   GFR 64.70 06/21/2023   EGFR 80 10/24/2022   GFRNONAA >60 12/27/2023    Prediabetes  Metformin  500 mg bid  Lab Results  Component Value Date   HGBA1C 6.1 06/21/2023   HGBA1C 5.9 (H) 02/28/2023   HGBA1C 6.0 (H) 10/24/2022   Lab Results  Component Value Date   WBC 9.5 12/27/2023   HGB 13.8 12/27/2023   HCT 43.0 12/27/2023   MCV 79.9 (L) 12/27/2023   PLT 174 12/27/2023    Lab Results  Component Value Date   TSH 3.16 06/21/2023   Lab Results  Component Value Date   ALT 11 06/21/2023   AST 11 06/21/2023   ALKPHOS 103 06/21/2023   BILITOT 0.3 06/21/2023    Lab Results  Component Value Date   VITAMINB12 702 02/28/2023   Last vitamin D  Lab Results  Component Value Date   VD25OH 56.4 10/24/2022     Patient Active Problem List   Diagnosis Date Noted   Caregiver stress 04/07/2024   Current use of proton pump inhibitor 04/07/2024   Rectal bleeding 02/28/2024   Status post total left knee replacement 01/06/2024   Pre-operative clearance 09/02/2023   Obesity due to excess calories 03/28/2023   Primary osteoarthritis of left knee 11/28/2022   Bilateral knee pain 11/23/2022   Hepatic steatosis 11/07/2022   Fatigue 10/24/2022   Pre-diabetes 10/24/2022   Other hyperlipidemia 10/24/2022   Depression screen 10/24/2022   Hemorrhoids 06/07/2022   Abdominal pain, left upper quadrant 02/01/2022   Rib pain on left side 06/13/2020  Colon cancer screening 05/06/2020   Insomnia, psychophysiological 03/14/2020   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   Light headed 03/09/2020   Grief reaction 10/03/2018   Generalized anxiety disorder 01/23/2017   Vitamin D  deficiency 05/20/2015   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   Morbid obesity (HCC) with starting BMI 37 10/25/2011   Routine general medical examination at a health care facility 10/16/2011   Hyperlipidemia LDL goal <130 12/04/2007   GERD 12/04/2007   Essential hypertension 12/04/2007   Past Medical History:  Diagnosis Date   Allergy    pollen   Anxiety 10/2018   Bursitis, trochanteric    bi/lat    Dental crowns present    x 4   Diverticulosis    Fatty liver    GERD (gastroesophageal reflux disease) Unsure   Hyperlipidemia Unsure   no current med.   Hypertension 10/2018   white coat syndrome,   Mass of breast, left 01/2012   Obesity     Palpitations    PONV (postoperative nausea and vomiting)    Pre-diabetes    Pre-diabetes    Vitamin D  deficiency    Past Surgical History:  Procedure Laterality Date   ABDOMINAL HYSTERECTOMY  02/1994   partial   ANTERIOR AND POSTERIOR REPAIR WITH SACROSPINOUS FIXATION N/A 08/20/2022   Procedure: POSTERIOR REPAIR WITH PERINEORRHAPHY AND WITH  SACROSPINOUS FIXATION;  Surgeon: Marilynne Rosaline SAILOR, MD;  Location[facility];  Service: Gynecology;  Laterality: N/A;   BREAST BIOPSY Right 08/04/2021   BREAST EXCISIONAL BIOPSY Left 2013   BREAST SURGERY  01/10/12   lumpectomy - left (excisional biopsy benign)   CHOLECYSTECTOMY N/A 02/10/2016   Procedure: LAPAROSCOPIC CHOLECYSTECTOMY WITH INTRAOPERATIVE CHOLANGIOGRAM;  Surgeon: Deward Null III, MD;  Location: [facility];  Service: General;  Laterality: N/A;   COLONOSCOPY  04/06/2010   D Brodie, normal w/tics   RECTOCELE REPAIR  06/14/2011   cystocele repair, pt states Dr Marykay did not do rectocele and said she had to come back later.   ROBOTIC ASSISTED LAPAROSCOPIC SACROCOLPOPEXY  06/14/2011   Procedure: ROBOTIC ASSISTED LAPAROSCOPIC SACROCOLPOPEXY;  Surgeon: Noretta Ferrara, MD;  Location: [facility];  Service: Urology;  Laterality: N/A;   TOTAL KNEE ARTHROPLASTY Left 01/06/2024   Procedure: LEFT TOTAL KNEE ARTHROPLASTY;  Surgeon: Jerri Kay HERO, MD;  Location: [facility];  Service: Orthopedics;  Laterality: Left;   TUBAL LIGATION  ?1988   Social History   Tobacco Use   Smoking status: Never    Passive exposure: Past   Smokeless tobacco: Never  Vaping Use   Vaping status: Never Used  Substance Use Topics   Alcohol use: No    Alcohol/week: 0.0 standard drinks of alcohol   Drug use: No   Family History  Problem Relation Age of Onset   Hypertension Mother    Hyperlipidemia Mother    Diabetes Mother    Breast cancer Mother 81   Uterine cancer Mother    Arrhythmia Mother    Heart failure Mother    Cancer Mother    GER disease  Father    Depression Sister    Heart disease Brother    Heart attack Maternal Grandmother    Hypertension Maternal Grandmother    Heart disease Paternal Grandmother    Hypertension Paternal Grandmother    Heart attack Paternal Uncle    Heart disease Paternal Uncle    Heart disease Paternal Uncle    Colon cancer Neg Hx    Colon polyps  Neg Hx    Esophageal cancer Neg Hx    Rectal cancer Neg Hx    Stomach cancer Neg Hx    Allergies  Allergen Reactions   Buspar  [Buspirone ] Anxiety    Made her more anxious   Current Outpatient Medications on File Prior to Visit  Medication Sig Dispense Refill   acidophilus (RISAQUAD) CAPS capsule Take 1 capsule by mouth daily.     amLODipine  (NORVASC ) 2.5 MG tablet Take 1 tablet (2.5 mg total) by mouth daily. 90 tablet 3   b complex vitamins capsule Take 1 capsule by mouth daily.     docusate sodium  (COLACE) 100 MG capsule Take 1 capsule (100 mg total) by mouth daily as needed. 30 capsule 2   esomeprazole  (NEXIUM ) 20 MG capsule Take 20 mg by mouth 2 (two) times daily before a meal.     estradiol  (ESTRACE ) 0.1 MG/GM vaginal cream Place 0.5 g vaginally 2 (two) times a week. Place 0.5g nightly for two weeks then twice a week after 30 g 11   fluticasone  (FLONASE ) 50 MCG/ACT nasal spray Place 2 sprays into both nostrils daily. 48 g 3   hydrocortisone  (ANUSOL -HC) 25 MG suppository INSERT ONE SUPPOSITORY RECTALLY TWICE A DAY AS NEEDED FOR HEMORRHOIDS OR ANAL ITCHING 12 suppository 0   loratadine (CLARITIN) 10 MG tablet Take 10 mg by mouth daily as needed for allergies.     metFORMIN  (GLUCOPHAGE ) 500 MG tablet Take 1 tablet (500 mg total) by mouth 2 (two) times daily with a meal. 180 tablet 0   ondansetron  (ZOFRAN ) 4 MG tablet Take 1 tablet (4 mg total) by mouth every 8 (eight) hours as needed for nausea or vomiting. 40 tablet 0   sertraline  (ZOLOFT ) 25 MG tablet Take 1 tablet (25 mg total) by mouth daily. 90 tablet 3   Vitamin D , Cholecalciferol, 25 MCG (1000  UT) TABS Take 5,000 Units by mouth daily.     No current facility-administered medications on file prior to visit.    Review of Systems  Constitutional:  Positive for fatigue. Negative for activity change, appetite change, fever and unexpected weight change.  HENT:  Negative for congestion, ear pain, rhinorrhea, sinus pressure and sore throat.   Eyes:  Negative for pain, redness and visual disturbance.  Respiratory:  Negative for cough, shortness of breath and wheezing.   Cardiovascular:  Negative for chest pain and palpitations.  Gastrointestinal:  Negative for abdominal pain, blood in stool, constipation and diarrhea.  Endocrine: Negative for polydipsia and polyuria.  Genitourinary:  Negative for dysuria, frequency and urgency.  Musculoskeletal:  Negative for arthralgias, back pain and myalgias.  Skin:  Negative for pallor and rash.  Allergic/Immunologic: Negative for environmental allergies.  Neurological:  Positive for light-headedness and headaches. Negative for dizziness, tremors, seizures, syncope, facial asymmetry, speech difficulty, weakness and numbness.  Hematological:  Negative for adenopathy. Does not bruise/bleed easily.  Psychiatric/Behavioral:  Negative for decreased concentration and dysphoric mood. The patient is nervous/anxious.        High stress level with caregiving       Objective:   Physical Exam Constitutional:      General: She is not in acute distress.    Appearance: Normal appearance. She is well-developed. She is obese. She is not ill-appearing or diaphoretic.  HENT:     Head: Normocephalic and atraumatic.     Right Ear: Tympanic membrane normal.     Left Ear: Tympanic membrane normal.     Nose: Nose normal.  Mouth/Throat:     Mouth: Mucous membranes are moist.     Pharynx: Oropharynx is clear.  Eyes:     General: No scleral icterus.       Right eye: No discharge.        Left eye: No discharge.     Conjunctiva/sclera: Conjunctivae normal.      Pupils: Pupils are equal, round, and reactive to light.  Neck:     Thyroid : No thyromegaly.     Vascular: No carotid bruit or JVD.  Cardiovascular:     Rate and Rhythm: Normal rate and regular rhythm.     Heart sounds: Normal heart sounds.     No gallop.  Pulmonary:     Effort: Pulmonary effort is normal. No respiratory distress.     Breath sounds: Normal breath sounds. No stridor. No wheezing, rhonchi or rales.  Abdominal:     General: There is no distension or abdominal bruit.     Palpations: Abdomen is soft. There is no mass.     Tenderness: There is no abdominal tenderness. There is no guarding or rebound.  Musculoskeletal:     Cervical back: Normal range of motion and neck supple.     Right lower leg: No edema.     Left lower leg: No edema.  Lymphadenopathy:     Cervical: No cervical adenopathy.  Skin:    General: Skin is warm and dry.     Coloration: Skin is not pale.     Findings: No rash.  Neurological:     Mental Status: She is alert.     Cranial Nerves: No cranial nerve deficit, dysarthria or facial asymmetry.     Sensory: No sensory deficit.     Motor: No weakness, tremor, atrophy or abnormal muscle tone.     Coordination: Coordination normal. Finger-Nose-Finger Test normal.     Gait: Gait normal.     Deep Tendon Reflexes: Reflexes are normal and symmetric. Reflexes normal.  Psychiatric:        Mood and Affect: Mood is anxious.        Behavior: Behavior normal.     Comments: Candidly discusses symptoms and stressors              Assessment & Plan:   Problem List Items Addressed This Visit       Cardiovascular and Mediastinum   Essential hypertension   bp in fair control at this time  BP Readings from Last 1 Encounters:  04/07/24 131/80   No changes needed Most recent labs reviewed  Disc lifstyle change with low sodium diet and exercise  Plan to continue amlodipine  2.5 mg daily       Relevant Orders   CBC with Differential/Platelet   TSH    Comprehensive metabolic panel with GFR     Other   Vitamin D  deficiency   D level ordered        Relevant Orders   VITAMIN D  25 Hydroxy (Vit-D Deficiency, Fractures)   Pre-diabetes   A1c ordered  disc imp of low glycemic diet and wt loss to prevent DM2       Relevant Orders   Hemoglobin A1c   Light headed   Intermittent A little improved since it started Normal orthostatic bps today-reassuring Normal neuro exam  Lab for this and fatigue       Relevant Orders   CBC with Differential/Platelet   Fatigue - Primary   May be multi factorial (stress/ post operative from knee surgery/  less time for self care/ reduced appetite/ reduced exercise and possible muscle loss)  Also occational light headedness No changes on physical exam or vitals   Lab today  Plan to follow       Current use of proton pump inhibitor   Nexium  20 mg bid   B12 added to labs Fatigue also       Relevant Orders   Vitamin B12   Caregiver stress   Caring for her mother which is difficult  She is difficult to please  Reviewed stressors/ coping techniques/symptoms/ support sources/ tx options and side effects in detail today  No doubt adding to her fatigue Also takes away from time for self care   Referral made for mental health counseling Continues sertraline  25 mg which has been very helpful      Relevant Orders   Ambulatory referral to Psychology   Other Visit Diagnoses       Need for influenza vaccination       Relevant Orders   Flu vaccine trivalent PF, 6mos and older(Flulaval,Afluria,Fluarix,Fluzone) (Completed)

## 2024-04-07 NOTE — Assessment & Plan Note (Signed)
 bp in fair control at this time  BP Readings from Last 1 Encounters:  04/07/24 131/80   No changes needed Most recent labs reviewed  Disc lifstyle change with low sodium diet and exercise  Plan to continue amlodipine  2.5 mg daily

## 2024-04-07 NOTE — Assessment & Plan Note (Signed)
 Nexium  20 mg bid   B12 added to labs Fatigue also

## 2024-04-07 NOTE — Assessment & Plan Note (Signed)
 Intermittent A little improved since it started Normal orthostatic bps today-reassuring Normal neuro exam  Lab for this and fatigue

## 2024-04-07 NOTE — Assessment & Plan Note (Signed)
 A1c ordered   disc imp of low glycemic diet and wt loss to prevent DM2

## 2024-04-09 ENCOUNTER — Ambulatory Visit: Attending: Cardiology | Admitting: Cardiology

## 2024-04-20 ENCOUNTER — Ambulatory Visit (INDEPENDENT_AMBULATORY_CARE_PROVIDER_SITE_OTHER): Admitting: Physician Assistant

## 2024-04-21 NOTE — Progress Notes (Unsigned)
   SUBJECTIVE: Discussed the use of AI scribe software for clinical note transcription with the patient, who gave verbal consent to proceed.  Chief Complaint: Obesity  Interim History: ***  Makayla [name] is here to discuss her progress with her obesity treatment plan. She is on the {HWW Weight Loss Plan:210964005} and states she {CHL AMB IS/IS NOT:210130109} following her eating plan approximately *** % of the time. She states she {CHL AMB IS/IS NOT:210130109} exercising *** minutes *** times per week.   OBJECTIVE: Visit Diagnoses: Problem List Items Addressed This Visit     Vitamin D  deficiency   Essential hypertension   Pre-diabetes - Primary   Status post total left knee replacement   Other Visit Diagnoses       Poor sleep         Generalized obesity- Start BMI 37.74           No data recorded No data recorded No data recorded No data recorded   ASSESSMENT AND PLAN:  Diet: Makayla [name] {CHL AMB IS/IS NOT:210130109} currently in the action stage of change. As such, her goal is to {HWW Weight Loss Efforts:210964006}. She {HAS HAS WNU:81165} agreed to {HWW Weight Loss Plan:210964005}.  Exercise: Makayla [name] has been instructed {HWW Exercise:210964007} for weight loss and overall health benefits.   Behavior Modification:  We discussed the following Behavioral Modification Strategies today: {HWW Behavior Modification:210964008}. We discussed various medication options to help Makayla [name] with her weight loss efforts and we both agreed to ***.  No follow-ups on file.Makayla [name] She was informed of the importance of frequent follow up visits to maximize her success with intensive lifestyle modifications for her multiple health conditions.  Attestation Statements:   Reviewed by clinician on day of visit: allergies, medications, problem list, medical history, surgical history, family history, social history, and previous encounter notes.   Time spent on visit including pre-visit chart review and post-visit  care and charting was *** minutes.    Makayla Hinchcliff, PA-C

## 2024-04-22 ENCOUNTER — Ambulatory Visit (INDEPENDENT_AMBULATORY_CARE_PROVIDER_SITE_OTHER): Admitting: Physician Assistant

## 2024-04-22 DIAGNOSIS — Z96652 Presence of left artificial knee joint: Secondary | ICD-10-CM

## 2024-04-22 DIAGNOSIS — I10 Essential (primary) hypertension: Secondary | ICD-10-CM

## 2024-04-22 DIAGNOSIS — E559 Vitamin D deficiency, unspecified: Secondary | ICD-10-CM

## 2024-04-22 DIAGNOSIS — Z72820 Sleep deprivation: Secondary | ICD-10-CM

## 2024-04-22 DIAGNOSIS — R7303 Prediabetes: Secondary | ICD-10-CM

## 2024-04-22 DIAGNOSIS — E669 Obesity, unspecified: Secondary | ICD-10-CM

## 2024-05-25 ENCOUNTER — Ambulatory Visit: Admitting: Dermatology

## 2024-05-25 DIAGNOSIS — D492 Neoplasm of unspecified behavior of bone, soft tissue, and skin: Secondary | ICD-10-CM

## 2024-05-25 DIAGNOSIS — L989 Disorder of the skin and subcutaneous tissue, unspecified: Secondary | ICD-10-CM | POA: Diagnosis not present

## 2024-05-25 DIAGNOSIS — L280 Lichen simplex chronicus: Secondary | ICD-10-CM

## 2024-05-25 MED ORDER — CLOBETASOL PROPIONATE 0.05 % EX CREA: 1.0000 | TOPICAL_CREAM | Freq: Two times a day (BID) | CUTANEOUS | 0 refills | Status: AC

## 2024-05-25 NOTE — Progress Notes (Signed)
   Follow-Up Visit   Subjective  Makayla [name] is a 63 y.o. female who presents for the following: Rash L lower leg~71m, itchy, has tried Benadryl  cream otc HC, HC 2.5%,  pt had knee replacement 01/2024 and she thinks rash is from the Duraprep, check spot R hand ~34yrs, itchy   The following portions of the chart were reviewed this encounter and updated as appropriate: medications, allergies, medical history  Review of Systems:  No other skin or systemic complaints except as noted in HPI or Assessment and Plan.  Objective  Well appearing patient in no apparent distress; mood and affect are within normal limits.   A focused examination was performed of the following areas:   Relevant exam findings are noted in the Assessment and Plan.    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 Condition is bothersome/symptomatic for patient. Currently flared.   Patient Education: -LSC is a common form of chronic neurodermatitis that presents as dry, patchy areas of skin that are scaly and thick. The hypertrophic epidermis generally seen is typically the result of habitual scratching or rubbing of a specific area of the skin.  Treatment Plan: Encouraged pt to avoid scratching Start Clobetasol cr bid x 2 weeks, then decrease to qd until resolved, avoid f/g/a  Topical steroids (such as triamcinolone , fluocinolone, fluocinonide, mometasone, clobetasol, halobetasol, betamethasone, hydrocortisone ) can cause thinning and lightening of the skin if they are used for too long in the same area. Your physician has selected the right strength medicine for your problem and area affected on the body. Please use your medication only as directed by your physician to prevent side effects.    PRURIGO NODULE vs WART vs OTHER Exam: pink scaly nodules x 2 R 5th  MCP  Treatment Plan: Start Clobetasol cr bid for 2 weeks then decrease to qd until resolved  Topical steroids (such as triamcinolone , fluocinolone, fluocinonide, mometasone, clobetasol, halobetasol, betamethasone, hydrocortisone ) can cause thinning and lightening of the skin if they are used for too long in the same area. Your physician has selected the right strength medicine for your problem and area affected on the body. Please use your medication only as directed by your physician to prevent side effects.   Avoid picking, scratching Consider shave removal/bx if not improved on f/up     Return in about 1 month (around 06/25/2024) for [facility] f/u, PN vs Wart f/u.  I, Grayce Saunas, RMA, am acting as scribe for Rexene Rattler, MD .   Documentation: I have reviewed the above documentation for accuracy and completeness, and I agree with the above.  Rexene Rattler, MD

## 2024-05-25 NOTE — Patient Instructions (Signed)

## 2024-06-08 ENCOUNTER — Encounter: Payer: Self-pay | Admitting: Radiology

## 2024-06-16 ENCOUNTER — Other Ambulatory Visit: Payer: Self-pay | Admitting: Family Medicine

## 2024-06-16 DIAGNOSIS — Z1231 Encounter for screening mammogram for malignant neoplasm of breast: Secondary | ICD-10-CM

## 2024-06-22 ENCOUNTER — Ambulatory Visit: Admitting: Family Medicine

## 2024-06-22 ENCOUNTER — Encounter: Payer: Self-pay | Admitting: Family Medicine

## 2024-06-22 VITALS — BP 134/82 | HR 115 | Temp 98.5°F | Ht 63.0 in | Wt 219.5 lb

## 2024-06-22 DIAGNOSIS — J0100 Acute maxillary sinusitis, unspecified: Secondary | ICD-10-CM

## 2024-06-22 DIAGNOSIS — J019 Acute sinusitis, unspecified: Secondary | ICD-10-CM | POA: Insufficient documentation

## 2024-06-22 MED ORDER — AMOXICILLIN-POT CLAVULANATE 875-125 MG PO TABS: 1.0000 | ORAL_TABLET | Freq: Two times a day (BID) | ORAL | 0 refills | Status: DC

## 2024-06-22 MED ORDER — BENZONATATE 200 MG PO CAPS: 200.0000 mg | ORAL_CAPSULE | Freq: Three times a day (TID) | ORAL | 1 refills | Status: DC | PRN

## 2024-06-22 NOTE — Assessment & Plan Note (Addendum)
 10 d s/p viral uri  Facial pain /congestion and cough  Neg covid testing at home  Reassuring exam   Disc symptomatic care - see instructions on AVS Augmentin  and tessalon  prescription  Will try delsym also for cough as tolerated   Consider prednisone if no improvement  Update if not starting to improve in a week or if worsening  Call back and Er precautions noted in detail today    Meds ordered this encounter  Medications   amoxicillin -clavulanate (AUGMENTIN ) 875-125 MG tablet    Sig: Take 1 tablet by mouth 2 (two) times daily.    Dispense:  14 tablet    Refill:  0   benzonatate  (TESSALON ) 200 MG capsule    Sig: Take 1 capsule (200 mg total) by mouth 3 (three) times daily as needed for cough. Swallow whole    Dispense:  30 capsule    Refill:  1

## 2024-06-22 NOTE — Progress Notes (Signed)
 Subjective:    Patient ID: Makayla [name], female    DOB: 1960-08-21, 63 y.o.   MRN: [id number]  HPI  Wt Readings from Last 3 Encounters:  06/22/24 219 lb 8 oz (99.6 kg)  04/07/24 214 lb 2 oz (97.1 kg)  03/09/24 209 lb (94.8 kg)   38.88 kg/m  Vitals:   06/22/24 0801  BP: 134/82  Pulse: (!) 115  Temp: 98.5 F (36.9 C)  SpO2: 98%    Pt presents with c/o  Uri symptoms   Had been on a cruise  Home test Friday-covid neg   Slight cough about 10 d  Then 7 d ago-other symptoms   Bad nasal congestion - clear mucous  Pressure in ears  Pressure and pain in face  Cough is a little productive= clear also  Coughs so hard she vomits    ST is bad  (today a little better)  No wheezing    Over the counter  Tylenol  Cough drops  Water  Benadryl  once-keeps her awake  Flonase  Claritin     Patient Active Problem List   Diagnosis Date Noted   Acute sinusitis 06/22/2024   Caregiver stress 04/07/2024   Current use of proton pump inhibitor 04/07/2024   Rectal bleeding 02/28/2024   Status post total left knee replacement 01/06/2024   Pre-operative clearance 09/02/2023   Obesity due to excess calories 03/28/2023   Primary osteoarthritis of left knee 11/28/2022   Bilateral knee pain 11/23/2022   Hepatic steatosis 11/07/2022   Fatigue 10/24/2022   Pre-diabetes 10/24/2022   Other hyperlipidemia 10/24/2022   Depression screen 10/24/2022   Hemorrhoids 06/07/2022   Abdominal pain, left upper quadrant 02/01/2022   Rib pain on left side 06/13/2020   Colon cancer screening 05/06/2020   Insomnia, psychophysiological 03/14/2020   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   Light headed 03/09/2020   Grief reaction 10/03/2018   Generalized anxiety disorder 01/23/2017   Vitamin D  deficiency 05/20/2015   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   Morbid obesity (HCC) with starting BMI 37 10/25/2011   Routine general medical examination at a health  care facility 10/16/2011   Hyperlipidemia LDL goal <130 12/04/2007   GERD 12/04/2007   Essential hypertension 12/04/2007   Past Medical History:  Diagnosis Date   Allergy    pollen   Anxiety 10/2018   Bursitis, trochanteric    bi/lat    Dental crowns present    x 4   Diverticulosis    Fatty liver    GERD (gastroesophageal reflux disease) Unsure   Hyperlipidemia Unsure   no current med.   Hypertension 10/2018   white coat syndrome,   Mass of breast, left 01/2012   Obesity    Palpitations    PONV (postoperative nausea and vomiting)    Pre-diabetes    Pre-diabetes    Vitamin D  deficiency    Past Surgical History:  Procedure Laterality Date   ABDOMINAL HYSTERECTOMY  02/1994   partial   ANTERIOR AND POSTERIOR REPAIR WITH SACROSPINOUS FIXATION N/A 08/20/2022   Procedure: POSTERIOR REPAIR WITH PERINEORRHAPHY AND WITH  SACROSPINOUS FIXATION;  Surgeon: Marilynne Rosaline SAILOR, MD;  Location: [facility];  Service: Gynecology;  Laterality: N/A;   BREAST BIOPSY Right 08/04/2021   BREAST EXCISIONAL BIOPSY Left 2013   BREAST SURGERY  01/10/12   lumpectomy - left (excisional biopsy benign)   CHOLECYSTECTOMY N/A 02/10/2016   Procedure: LAPAROSCOPIC CHOLECYSTECTOMY WITH INTRAOPERATIVE CHOLANGIOGRAM;  Surgeon: Deward Null III, MD;  Location: [facility];  Service: General;  Laterality: N/A;   COLONOSCOPY  04/06/2010   D Brodie, normal w/tics   RECTOCELE REPAIR  06/14/2011   cystocele repair, pt states Dr Marykay did not do rectocele and said she had to come back later.   ROBOTIC ASSISTED LAPAROSCOPIC SACROCOLPOPEXY  06/14/2011   Procedure: ROBOTIC ASSISTED LAPAROSCOPIC SACROCOLPOPEXY;  Surgeon: Noretta Ferrara, MD;  Location: [facility];  Service: Urology;  Laterality: N/A;   TOTAL KNEE ARTHROPLASTY Left 01/06/2024   Procedure: LEFT TOTAL KNEE ARTHROPLASTY;  Surgeon: Jerri Kay HERO, MD;  Location: [facility];  Service: Orthopedics;  Laterality: Left;   TUBAL LIGATION  ?1988   Social History    Tobacco Use   Smoking status: Never    Passive exposure: Past   Smokeless tobacco: Never  Vaping Use   Vaping status: Never Used  Substance Use Topics   Alcohol use: No    Alcohol/week: 0.0 standard drinks of alcohol   Drug use: No   Family History  Problem Relation Age of Onset   Hypertension Mother    Hyperlipidemia Mother    Diabetes Mother    Breast cancer Mother 34   Uterine cancer Mother    Arrhythmia Mother    Heart failure Mother    Cancer Mother    GER disease Father    Depression Sister    Heart disease Brother    Heart attack Maternal Grandmother    Hypertension Maternal Grandmother    Heart disease Paternal Grandmother    Hypertension Paternal Grandmother    Heart attack Paternal Uncle    Heart disease Paternal Uncle    Heart disease Paternal Uncle    Colon cancer Neg Hx    Colon polyps Neg Hx    Esophageal cancer Neg Hx    Rectal cancer Neg Hx    Stomach cancer Neg Hx    Allergies  Allergen Reactions   Buspar  [Buspirone ] Anxiety    Made her more anxious   Current Outpatient Medications on File Prior to Visit  Medication Sig Dispense Refill   acidophilus (RISAQUAD) CAPS capsule Take 1 capsule by mouth daily.     amLODipine  (NORVASC ) 2.5 MG tablet Take 1 tablet (2.5 mg total) by mouth daily. 90 tablet 3   b complex vitamins capsule Take 1 capsule by mouth daily.     clobetasol cream (TEMOVATE) 0.05 % Apply 1 Application topically 2 (two) times daily. Bid to aa itchy rash left lower leg and right hand for 2 weeks then decrease to qd until resolved, avoid face, groin, axilla 45 g 0   docusate sodium  (COLACE) 100 MG capsule Take 1 capsule (100 mg total) by mouth daily as needed. 30 capsule 2   esomeprazole  (NEXIUM ) 20 MG capsule Take 20 mg by mouth 2 (two) times daily before a meal.     estradiol  (ESTRACE ) 0.1 MG/GM vaginal cream Place 0.5 g vaginally 2 (two) times a week. Place 0.5g nightly for two weeks then twice a week after 30 g 11   fluticasone   (FLONASE ) 50 MCG/ACT nasal spray Place 2 sprays into both nostrils daily. 48 g 3   hydrocortisone  (ANUSOL -HC) 25 MG suppository INSERT ONE SUPPOSITORY RECTALLY TWICE A DAY AS NEEDED FOR HEMORRHOIDS OR ANAL ITCHING 12 suppository 0   loratadine (CLARITIN) 10 MG tablet Take 10 mg by mouth daily as needed for allergies.     metFORMIN  (GLUCOPHAGE ) 500 MG tablet Take 1 tablet (500 mg total) by mouth 2 (two) times daily with a  meal. 180 tablet 0   ondansetron  (ZOFRAN ) 4 MG tablet Take 1 tablet (4 mg total) by mouth every 8 (eight) hours as needed for nausea or vomiting. 40 tablet 0   sertraline  (ZOLOFT ) 25 MG tablet Take 1 tablet (25 mg total) by mouth daily. 90 tablet 3   Vitamin D , Cholecalciferol, 25 MCG (1000 UT) TABS Take 5,000 Units by mouth daily.     No current facility-administered medications on file prior to visit.    Review of Systems  Constitutional:  Positive for appetite change and fatigue. Negative for fever.  HENT:  Positive for congestion, postnasal drip, rhinorrhea, sinus pressure, sinus pain, sneezing and sore throat. Negative for ear pain.   Eyes:  Negative for pain and discharge.  Respiratory:  Positive for cough. Negative for shortness of breath, wheezing and stridor.   Cardiovascular:  Negative for chest pain.  Gastrointestinal:  Negative for diarrhea, nausea and vomiting.  Genitourinary:  Negative for frequency, hematuria and urgency.  Musculoskeletal:  Negative for arthralgias and myalgias.  Skin:  Negative for rash.  Neurological:  Positive for headaches. Negative for dizziness, weakness and light-headedness.  Psychiatric/Behavioral:  Negative for confusion and dysphoric mood.        Objective:   Physical Exam Constitutional:      General: She is not in acute distress.    Appearance: Normal appearance. She is well-developed. She is obese. She is not ill-appearing, toxic-appearing or diaphoretic.  HENT:     Head: Normocephalic and atraumatic.     Comments:  Maxillary sinus tenderness worse on right     Right Ear: Tympanic membrane, ear canal and external ear normal.     Left Ear: Tympanic membrane, ear canal and external ear normal.     Nose: Congestion and rhinorrhea present.     Mouth/Throat:     Mouth: Mucous membranes are moist.     Pharynx: Oropharynx is clear. No oropharyngeal exudate or posterior oropharyngeal erythema.     Comments: Clear pnd  Eyes:     General:        Right eye: No discharge.        Left eye: No discharge.     Conjunctiva/sclera: Conjunctivae normal.     Pupils: Pupils are equal, round, and reactive to light.  Cardiovascular:     Rate and Rhythm: Normal rate.     Heart sounds: Normal heart sounds.  Pulmonary:     Effort: Pulmonary effort is normal. No respiratory distress.     Breath sounds: Normal breath sounds. No stridor. No wheezing, rhonchi or rales.     Comments: Good air exch  Scant wheeze on forced exp only   Upper airway sounds  Chest:     Chest wall: No tenderness.  Musculoskeletal:     Cervical back: Normal range of motion and neck supple.  Lymphadenopathy:     Cervical: No cervical adenopathy.  Skin:    General: Skin is warm and dry.     Capillary Refill: Capillary refill takes less than 2 seconds.     Findings: No rash.  Neurological:     Mental Status: She is alert.     Cranial Nerves: No cranial nerve deficit.  Psychiatric:        Mood and Affect: Mood normal.           Assessment & Plan:   Problem List Items Addressed This Visit       Respiratory   Acute sinusitis - Primary   10 d  s/p viral uri  Facial pain /congestion and cough  Neg covid testing at home  Reassuring exam   Disc symptomatic care - see instructions on AVS Augmentin  and tessalon  prescription  Will try delsym also for cough as tolerated   Consider prednisone if no improvement  Update if not starting to improve in a week or if worsening  Call back and Er precautions noted in detail today    Meds  ordered this encounter  Medications   amoxicillin -clavulanate (AUGMENTIN ) 875-125 MG tablet    Sig: Take 1 tablet by mouth 2 (two) times daily.    Dispense:  14 tablet    Refill:  0   benzonatate  (TESSALON ) 200 MG capsule    Sig: Take 1 capsule (200 mg total) by mouth 3 (three) times daily as needed for cough. Swallow whole    Dispense:  30 capsule    Refill:  1         Relevant Medications   amoxicillin -clavulanate (AUGMENTIN ) 875-125 MG tablet   benzonatate  (TESSALON ) 200 MG capsule

## 2024-06-22 NOTE — Patient Instructions (Signed)
 Drink fluids and rest  Delsym is good for cough and congestion  Try the tessalon  for cough also as needed  Continue your allergy medicines  Take augmentin  for sinus infection  Nasal saline for congestion as needed  Tylenol  for fever or pain or headache  Please alert us  if symptoms worsen (if severe or short of breath please go to the ER)   Update if not starting to improve in a week or if worsening

## 2024-06-24 ENCOUNTER — Ambulatory Visit: Admitting: Dermatology

## 2024-06-24 DIAGNOSIS — L280 Lichen simplex chronicus: Secondary | ICD-10-CM

## 2024-06-24 DIAGNOSIS — B079 Viral wart, unspecified: Secondary | ICD-10-CM

## 2024-06-24 DIAGNOSIS — D485 Neoplasm of uncertain behavior of skin: Secondary | ICD-10-CM | POA: Diagnosis not present

## 2024-06-24 NOTE — Patient Instructions (Addendum)

## 2024-06-24 NOTE — Progress Notes (Signed)
   Follow-Up Visit   Subjective  Makayla [name] is a 63 y.o. female who presents for the following: 1 month follow-up. LSC of the left lower pretibia improved with clobetasol  cream. Prurigo nodule vs wart vs other of the right 5th MCP with no changes after using clobetasol  cream. Present for 2 years, cracks.    The following portions of the chart were reviewed this encounter and updated as appropriate: medications, allergies, medical history  Review of Systems:  No other skin or systemic complaints except as noted in HPI or Assessment and Plan.  Objective  Well appearing patient in no apparent distress; mood and affect are within normal limits.  A focused examination was performed of the following areas: Right hand, left lower leg  Relevant physical exam findings are noted in the Assessment and Plan.  right 5th MCP 1.1 cm pink keratotic papule with adjacent smaller similar papule   Assessment & Plan  LICHEN SIMPLEX CHRONICUS Exam:Light violaceous patch with few residual papules and excoriation left lower pretibia.   Chronic and persistent condition with duration or expected duration over one year. Condition is improving with treatment but not currently at goal.   Lichen simplex chronicus [facility]) is a persistent itchy area of thickened skin that is induced by chronic rubbing and/or scratching (chronic dermatitis).  These areas may be pink, hyperpigmented and may have excoriations and bumps (prurigo nodules- PN).  LSC/PN is commonly observed in uncontrolled atopic dermatitis and other forms of eczema, and in other itchy skin conditions (eg, insect bites, scabies).  Sometimes it is not possible to know initial cause of LSC/PN if it has been present for a long time.  It generally responds well to treatment with high potency topical steroids.  It is important to stop rubbing/scratching the area in order to break the itch-scratch-rash-itch cycle, in order for the rash to resolve.   Treatment  Plan: Continue clobetasol  cream once to twice a day to raised areas until improved and prn itch ~2 weeks. Avoid applying to face, groin, and axilla. Use as directed. Long-term use can cause thinning of the skin.  Avoid picking/rubbing/scratching  Recommend mild soap and moisturizing cream 1-2 times daily.  Gentle skin care handout provided.     NEOPLASM OF UNCERTAIN BEHAVIOR OF SKIN right 5th MCP Epidermal / dermal shaving  Lesion diameter (cm):  1.1 Informed consent: discussed and consent obtained   Patient was prepped and draped in usual sterile fashion: Area prepped with alcohol. Anesthesia: the lesion was anesthetized in a standard fashion   Anesthetic:  1% lidocaine  w/ epinephrine  1-100,000 buffered w/ 8.4% NaHCO3 Instrument used: flexible razor blade   Hemostasis achieved with: pressure, aluminum chloride and electrodesiccation   Outcome: patient tolerated procedure well   Post-procedure details: wound care instructions given   Post-procedure details comment:  Ointment and small bandage applied  Specimen 1 - Surgical pathology Differential Diagnosis: Irritated Wart r/o SCC Check Margins: Yes Irritated Wart r/o SCC   Return as scheduled, for TBSE. Follow-up pending bx results.  IAndrea Kerns, CMA, am acting as scribe for Rexene Rattler, MD .   Documentation: I have reviewed the above documentation for accuracy and completeness, and I agree with the above.  Rexene Rattler, MD

## 2024-06-25 LAB — SURGICAL PATHOLOGY

## 2024-06-29 ENCOUNTER — Ambulatory Visit: Payer: Self-pay | Admitting: Dermatology

## 2024-06-29 NOTE — Telephone Encounter (Signed)
-----   Message from Rexene Rattler sent at 06/29/2024 12:13 PM EST ----- 1. Skin, right 5th MCP :       VERRUCA VULGARIS, IRRITATED   Benign irritated wart, recommend cryotherapy once healed - please call patient ----- Message ----- From: Interface, Lab In Three Zero One Sent: 06/25/2024   4:16 PM EST To: Rexene Rattler, MD

## 2024-06-29 NOTE — Telephone Encounter (Signed)
 Left message for patient to call back for biopsy results.

## 2024-06-29 NOTE — Telephone Encounter (Addendum)
 Patient returned call. Discussed bx results with patient and scheduled follow up treatment. Patient verbalized understanding and denied further questions.  ----- Message from Rexene Rattler sent at 06/29/2024 12:13 PM EST ----- 1. Skin, right 5th MCP :       VERRUCA VULGARIS, IRRITATED   Benign irritated wart, recommend cryotherapy once healed - please call patient ----- Message ----- From: Interface, Lab In Three Zero One Sent: 06/25/2024   4:16 PM EST To: Rexene Rattler, MD

## 2024-06-30 ENCOUNTER — Other Ambulatory Visit: Payer: Self-pay | Admitting: Family Medicine

## 2024-07-07 ENCOUNTER — Other Ambulatory Visit: Payer: Self-pay

## 2024-07-07 ENCOUNTER — Ambulatory Visit: Admitting: Physician Assistant

## 2024-07-07 DIAGNOSIS — Z96652 Presence of left artificial knee joint: Secondary | ICD-10-CM | POA: Diagnosis not present

## 2024-07-07 MED ORDER — AMOXICILLIN 500 MG PO CAPS: ORAL_CAPSULE | ORAL | 2 refills | Status: AC

## 2024-07-07 NOTE — Progress Notes (Signed)
 Post-Op Visit Note   Patient: Makayla [name]           Date of Birth: 1961-03-18           MRN: [id number] Visit Date: 07/07/2024 PCP: Randeen Laine DELENA, MD   Assessment & Plan:  Chief Complaint:  Chief Complaint  Patient presents with   Left Knee - Routine Post Op    Left TKA 01/06/2024   Visit Diagnoses:  1. Status post total left knee replacement     Plan: Patient is a pleasant 63 year old female who comes in today 6 months status post left total knee replacement 01/06/2024.  She has been doing well.  She notes occasional discomfort with rainy and cold weather.  She takes as needed over-the-counter medication for pain.  Examination of her left knee reveals range of motion from 0 to 125 degrees.  She is stable to valgus varus stress.  She is neurovascular intact distally.  At this point, she will continue with a home exercise program.  Dental prophylaxis reinforced and I have sent in amoxicillin  to take for her upcoming appointment.  Follow-up in 6 months for repeat evaluation and 2 view x-rays of the left knee.  Call with concerns or questions.  Follow-Up Instructions: Return in about 6 months (around 01/05/2025).   Orders:  Orders Placed This Encounter  Procedures   XR Knee 1-2 Views Left   Meds ordered this encounter  Medications   amoxicillin  (AMOXIL ) 500 MG capsule    Sig: Take 4 pills one hour prior to dental work    Dispense:  8 capsule    Refill:  2    Imaging: XR Knee 1-2 Views Left Result Date: 07/07/2024 Well-seated prosthesis without complication   PMFS History: Patient Active Problem List   Diagnosis Date Noted   Acute sinusitis 06/22/2024   Caregiver stress 04/07/2024   Current use of proton pump inhibitor 04/07/2024   Rectal bleeding 02/28/2024   Status post total left knee replacement 01/06/2024   Pre-operative clearance 09/02/2023   Obesity due to excess calories 03/28/2023   Primary osteoarthritis of left knee 11/28/2022   Bilateral knee pain  11/23/2022   Hepatic steatosis 11/07/2022   Fatigue 10/24/2022   Pre-diabetes 10/24/2022   Other hyperlipidemia 10/24/2022   Depression screen 10/24/2022   Hemorrhoids 06/07/2022   Abdominal pain, left upper quadrant 02/01/2022   Rib pain on left side 06/13/2020   Colon cancer screening 05/06/2020   Insomnia, psychophysiological 03/14/2020   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   Light headed 03/09/2020   Grief reaction 10/03/2018   Generalized anxiety disorder 01/23/2017   Vitamin D  deficiency 05/20/2015   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   Morbid obesity (HCC) with starting BMI 37 10/25/2011   Routine general medical examination at a health care facility 10/16/2011   Hyperlipidemia LDL goal <130 12/04/2007   GERD 12/04/2007   Essential hypertension 12/04/2007   Past Medical History:  Diagnosis Date   Allergy    pollen   Anxiety 10/2018   Bursitis, trochanteric    bi/lat    Dental crowns present    x 4   Diverticulosis    Fatty liver    GERD (gastroesophageal reflux disease) Unsure   Hyperlipidemia Unsure   no current med.   Hypertension 10/2018   white coat syndrome,   Mass of breast, left 01/2012   Obesity    Palpitations    PONV (postoperative nausea and vomiting)  Pre-diabetes    Pre-diabetes    Vitamin D  deficiency     Family History  Problem Relation Age of Onset   Hypertension Mother    Hyperlipidemia Mother    Diabetes Mother    Breast cancer Mother 41   Uterine cancer Mother    Arrhythmia Mother    Heart failure Mother    Cancer Mother    GER disease Father    Depression Sister    Heart disease Brother    Heart attack Maternal Grandmother    Hypertension Maternal Grandmother    Heart disease Paternal Grandmother    Hypertension Paternal Grandmother    Heart attack Paternal Uncle    Heart disease Paternal Uncle    Heart disease Paternal Uncle    Colon cancer Neg Hx    Colon polyps Neg Hx     Esophageal cancer Neg Hx    Rectal cancer Neg Hx    Stomach cancer Neg Hx     Past Surgical History:  Procedure Laterality Date   ABDOMINAL HYSTERECTOMY  02/1994   partial   ANTERIOR AND POSTERIOR REPAIR WITH SACROSPINOUS FIXATION N/A 08/20/2022   Procedure: POSTERIOR REPAIR WITH PERINEORRHAPHY AND WITH  SACROSPINOUS FIXATION;  Surgeon: Marilynne Rosaline SAILOR, MD;  Location: [facility] ;  Service: Gynecology;  Laterality: N/A;   BREAST BIOPSY Right 08/04/2021   BREAST EXCISIONAL BIOPSY Left 2013   BREAST SURGERY  01/10/12   lumpectomy - left (excisional biopsy benign)   CHOLECYSTECTOMY N/A 02/10/2016   Procedure: LAPAROSCOPIC CHOLECYSTECTOMY WITH INTRAOPERATIVE CHOLANGIOGRAM;  Surgeon: Deward Null III, MD;  Location: [facility];  Service: General;  Laterality: N/A;   COLONOSCOPY  04/06/2010   D Brodie, normal w/tics   RECTOCELE REPAIR  06/14/2011   cystocele repair, pt states Dr Marykay did not do rectocele and said she had to come back later.   ROBOTIC ASSISTED LAPAROSCOPIC SACROCOLPOPEXY  06/14/2011   Procedure: ROBOTIC ASSISTED LAPAROSCOPIC SACROCOLPOPEXY;  Surgeon: Noretta Ferrara, MD;  Location: [facility];  Service: Urology;  Laterality: N/A;   TOTAL KNEE ARTHROPLASTY Left 01/06/2024   Procedure: LEFT TOTAL KNEE ARTHROPLASTY;  Surgeon: Jerri Kay HERO, MD;  Location: [facility];  Service: Orthopedics;  Laterality: Left;   TUBAL LIGATION  ?1988   Social History   Occupational History   Occupation: RN-retired  Tobacco Use   Smoking status: Never    Passive exposure: Past   Smokeless tobacco: Never  Vaping Use   Vaping status: Never Used  Substance and Sexual Activity   Alcohol use: No    Alcohol/week: 0.0 standard drinks of alcohol   Drug use: No   Sexual activity: Not Currently    Birth control/protection: Post-menopausal    Comment: has used estradiol  patch 0.025  in the past, has stopped

## 2024-07-10 ENCOUNTER — Encounter: Payer: Self-pay | Admitting: Orthopaedic Surgery

## 2024-07-14 ENCOUNTER — Encounter: Admitting: Family Medicine

## 2024-07-15 ENCOUNTER — Encounter: Payer: Self-pay | Admitting: Family Medicine

## 2024-07-15 ENCOUNTER — Ambulatory Visit: Payer: Self-pay | Admitting: Family Medicine

## 2024-07-15 ENCOUNTER — Ambulatory Visit: Admitting: Family Medicine

## 2024-07-15 VITALS — BP 132/74 | HR 89 | Temp 98.1°F | Ht 64.0 in | Wt 221.0 lb

## 2024-07-15 DIAGNOSIS — Z79899 Other long term (current) drug therapy: Secondary | ICD-10-CM | POA: Diagnosis not present

## 2024-07-15 DIAGNOSIS — E785 Hyperlipidemia, unspecified: Secondary | ICD-10-CM

## 2024-07-15 DIAGNOSIS — R7303 Prediabetes: Secondary | ICD-10-CM

## 2024-07-15 DIAGNOSIS — D696 Thrombocytopenia, unspecified: Secondary | ICD-10-CM | POA: Insufficient documentation

## 2024-07-15 DIAGNOSIS — F411 Generalized anxiety disorder: Secondary | ICD-10-CM | POA: Diagnosis not present

## 2024-07-15 DIAGNOSIS — K760 Fatty (change of) liver, not elsewhere classified: Secondary | ICD-10-CM

## 2024-07-15 DIAGNOSIS — Z0000 Encounter for general adult medical examination without abnormal findings: Secondary | ICD-10-CM | POA: Diagnosis not present

## 2024-07-15 DIAGNOSIS — E559 Vitamin D deficiency, unspecified: Secondary | ICD-10-CM | POA: Diagnosis not present

## 2024-07-15 DIAGNOSIS — K219 Gastro-esophageal reflux disease without esophagitis: Secondary | ICD-10-CM | POA: Diagnosis not present

## 2024-07-15 DIAGNOSIS — Z1211 Encounter for screening for malignant neoplasm of colon: Secondary | ICD-10-CM

## 2024-07-15 DIAGNOSIS — I10 Essential (primary) hypertension: Secondary | ICD-10-CM

## 2024-07-15 LAB — CBC WITH DIFFERENTIAL/PLATELET
Basophils Absolute: 0.1 K/uL (ref 0.0–0.1)
Basophils Relative: 0.6 % (ref 0.0–3.0)
Eosinophils Absolute: 0.2 K/uL (ref 0.0–0.7)
Eosinophils Relative: 2 % (ref 0.0–5.0)
HCT: 38.4 % (ref 36.0–46.0)
Hemoglobin: 12.6 g/dL (ref 12.0–15.0)
Lymphocytes Relative: 23.9 % (ref 12.0–46.0)
Lymphs Abs: 2.2 K/uL (ref 0.7–4.0)
MCHC: 33 g/dL (ref 30.0–36.0)
MCV: 77 fl — ABNORMAL LOW (ref 78.0–100.0)
Monocytes Absolute: 0.8 K/uL (ref 0.1–1.0)
Monocytes Relative: 8.4 % (ref 3.0–12.0)
Neutro Abs: 6 K/uL (ref 1.4–7.7)
Neutrophils Relative %: 65.1 % (ref 43.0–77.0)
Platelets: 156 K/uL (ref 150.0–400.0)
RBC: 4.98 Mil/uL (ref 3.87–5.11)
RDW: 16.1 % — ABNORMAL HIGH (ref 11.5–15.5)
WBC: 9.2 K/uL (ref 4.0–10.5)

## 2024-07-15 LAB — LIPID PANEL
Cholesterol: 216 mg/dL — ABNORMAL HIGH (ref 0–200)
HDL: 52 mg/dL (ref >39.00)
LDL Cholesterol: 113 mg/dL — ABNORMAL HIGH (ref 0–99)
NonHDL: 163.53
Total CHOL/HDL Ratio: 4
Triglycerides: 253 mg/dL — ABNORMAL HIGH (ref 0.0–149.0)
VLDL: 50.6 mg/dL — ABNORMAL HIGH (ref 0.0–40.0)

## 2024-07-15 MED ORDER — ESTRADIOL 0.01 % VA CREA: TOPICAL_CREAM | VAGINAL | 5 refills | Status: AC

## 2024-07-15 NOTE — Assessment & Plan Note (Signed)
 Disc goals for lipids and reasons to control them Rev last labs with pt Rev low sat fat diet in detail  Lab today  Pt wants to avoid medication if possible

## 2024-07-15 NOTE — Assessment & Plan Note (Signed)
 Prediabetes  Lab Results  Component Value Date   HGBA1C 6.3 04/07/2024   HGBA1C 6.1 06/21/2023   HGBA1C 5.9 (H) 02/28/2023    disc imp of low glycemic diet and wt loss to prevent DM2

## 2024-07-15 NOTE — Assessment & Plan Note (Signed)
 Reviewed health habits including diet and exercise and skin cancer prevention Reviewed appropriate screening tests for age  Also reviewed health mt list, fam hx and immunization status , as well as social and family history   See HPI Labs reviewed and ordered Health Maintenance  Topic Date Due   Zoster (Shingles) Vaccine (1 of 2) Never done   Pneumococcal Vaccine for age over 80 (1 of 1 - PCV) Never done   Breast Cancer Screening  07/14/2024   COVID-19 Vaccine (3 - Moderna risk series) 07/06/2025*   Hepatitis C Screening  11/25/2027*   HIV Screening  11/25/2027*   DTaP/Tdap/Td vaccine (11 - Td or Tdap) 04/07/2027   Colon Cancer Screening  07/04/2032   Flu Shot  Completed   HPV Vaccine  Aged Out   Meningitis B Vaccine  Aged Out  *Topic was postponed. The date shown is not the original due date.    Considering shingrix vaccine Mammogram planned tomorrow  Discussed fall prevention, supplements and exercise for bone density  PHQ 0 with treatment

## 2024-07-15 NOTE — Assessment & Plan Note (Signed)
 Continues sertraline 25 mg daily  Doing better overall Encouraged good self care

## 2024-07-15 NOTE — Assessment & Plan Note (Signed)
 Lab Results  Component Value Date   VITAMINB12 455 04/07/2024   Last vitamin D  Lab Results  Component Value Date   VD25OH 63.42 04/07/2024   Continue to monitor

## 2024-07-15 NOTE — Assessment & Plan Note (Signed)
 Bmi 37.9   Discussed how this problem influences overall health and the risks it imposes  Reviewed plan for weight loss with lower calorie diet (via better food choices (lower glycemic and portion control) along with exercise building up to or more than 30 minutes 5 days per week including some aerobic activity and strength training   Getting back on track after knee replacement  Has been to healthy weight clinic in past

## 2024-07-15 NOTE — Assessment & Plan Note (Signed)
 Last vitamin D  Lab Results  Component Value Date   VD25OH 63.42 04/07/2024   Vitamin D  level is therapeutic with current supplementation Disc importance of this to bone and overall health

## 2024-07-15 NOTE — Assessment & Plan Note (Signed)
 Continues nexium  20 mg bid Well controlled Famotidine  was not effective Encouraged to avoid triggers

## 2024-07-15 NOTE — Assessment & Plan Note (Signed)
 Lab Results  Component Value Date   ALT 16 04/07/2024   AST 12 04/07/2024   ALKPHOS 95 04/07/2024   BILITOT 0.3 04/07/2024   Encouraged weight loss

## 2024-07-15 NOTE — Progress Notes (Signed)
 Subjective:    Patient ID: Makayla [name], female    DOB: 11-30-1960, 63 y.o.   MRN: [id number]  HPI  Here for health maintenance exam and to review chronic medical problems   Wt Readings from Last 3 Encounters:  07/15/24 221 lb (100.2 kg)  06/22/24 219 lb 8 oz (99.6 kg)  04/07/24 214 lb 2 oz (97.1 kg)   37.93 kg/m  Vitals:   07/15/24 1055  BP: 132/74  Pulse: 89  Temp: 98.1 F (36.7 C)  SpO2: 99%    Immunization History  Administered Date(s) Administered   Dtap, Unspecified 05/09/1962, 06/14/1962, 07/14/1962, 03/19/1966   Hep B, Unspecified 05/11/1994, 06/11/1994, 11/20/1994, 06/01/2002   Hepatitis A, Adult 03/23/2005, 10/31/2005   Influenza, Seasonal, Injecte, Preservative Fre 06/21/2023, 04/07/2024   Influenza,inj,Quad PF,6+ Mos 05/18/2014, 05/19/2015, 05/09/2016, 05/15/2017, 05/14/2018, 05/22/2019, 05/25/2020, 05/29/2021, 06/18/2022   Influenza-Unspecified 05/19/2015, 05/05/2018, 05/25/2020   MMR 01/22/1994   Measles 11/16/1970   Moderna Sars-Covid-2 Vaccination 07/29/2019, 09/04/2019   Polio, Unspecified 05/09/1962, 06/14/1962, 07/14/1962, 03/19/1966   Rubella 11/16/1970   Smallpox 03/29/1966   Td 01/22/1994, 04/05/2003, 04/07/2007, 04/06/2017   Td (Adult),5 Lf Tetanus Toxid, Preservative Free 03/01/2017   Tdap 02/05/2007    Health Maintenance Due  Topic Date Due   Zoster Vaccines- Shingrix (1 of 2) Never done   Pneumococcal Vaccine: 50+ Years (1 of 1 - PCV) Never done   Mammogram  07/14/2024   Feeling ok  Lingering cough but much better than she was   Shingrix -considering    Mammogram 07/2023  has it scheduled tomorrow  Self breast exam- no lumps  Past history of malignant phylloides treatment of breast   Gyn health Needs refill of estradiol  cream -is helpful  No longer sees gyn Had hysterectomy    Colon cancer screening  Colonoscopy 06/2022   Bone health  Dexa -not had yet  Falls-none  Fractures-none  Supplements -vitamin D   Last  vitamin D  Lab Results  Component Value Date   VD25OH 63.42 04/07/2024    Exercise  Trying to walk  Knee still hurts from knee replacement but gradually getting better  Needs to strength train -has a kettle bell   Hard to fit in time wise      Mood    07/15/2024   11:01 AM 06/22/2024    8:08 AM 04/07/2024   10:33 AM 09/02/2023   11:51 AM 06/21/2023    8:05 AM  Depression screen PHQ 2/9  Decreased Interest 0 0 0 0 0  Down, Depressed, Hopeless 0 0 0 0 0  PHQ - 2 Score 0 0 0 0 0  Altered sleeping 0 0 0  0  Tired, decreased energy 0 0 2  0  Change in appetite 0 0 0  0  Feeling bad or failure about yourself  0 0 0  0  Trouble concentrating 0 0 0  0  Moving slowly or fidgety/restless 0 0 0  0  Suicidal thoughts 0 0 0  0  PHQ-9 Score 0 0 2   0   Difficult doing work/chores Not difficult at all Not difficult at all Not difficult at all  Not difficult at all     Data saved with a previous flowsheet row definition   GAD Sertraline  25 mg daily    Some protein shakes for meals  Hard to cook for one  Needs more balanced diet     HTN bp is stable today  No cp or palpitations or headaches or  edema  No side effects to medicines  BP Readings from Last 3 Encounters:  07/15/24 132/74  06/22/24 134/82  04/07/24 131/80     Lab Results  Component Value Date   NA 138 04/07/2024   K 4.1 04/07/2024   CO2 28 04/07/2024   GLUCOSE 107 (H) 04/07/2024   BUN 12 04/07/2024   CREATININE 0.87 04/07/2024   CALCIUM  9.5 04/07/2024   GFR 70.60 04/07/2024   EGFR 80 10/24/2022   GFRNONAA >60 12/27/2023   Amlodipine  2.5 mg daily  GERD Nexium  20 mg bid-working well  Pepcid  in past   Lab Results  Component Value Date   VITAMINB12 455 04/07/2024   Hyperlipidemia Lab Results  Component Value Date   CHOL 231 (H) 06/21/2023   HDL 55.10 06/21/2023   LDLCALC 147 (H) 06/21/2023   TRIG 144.0 06/21/2023   CHOLHDL 4 06/21/2023   Due for lab  Wants to avoid medication    Prediabetes Lab Results  Component Value Date   HGBA1C 6.3 04/07/2024   HGBA1C 6.1 06/21/2023   HGBA1C 5.9 (H) 02/28/2023   Lab Results  Component Value Date   ALT 16 04/07/2024   AST 12 04/07/2024   ALKPHOS 95 04/07/2024   BILITOT 0.3 04/07/2024    Lab Results  Component Value Date   WBC 8.4 04/07/2024   HGB 13.2 04/07/2024   HCT 40.7 04/07/2024   MCV 78.1 04/07/2024   PLT 136.0 (L) 04/07/2024   Plt ct slightly low      Patient Active Problem List   Diagnosis Date Noted   Thrombocytopenia 07/15/2024   Acute sinusitis 06/22/2024   Caregiver stress 04/07/2024   Current use of proton pump inhibitor 04/07/2024   Status post total left knee replacement 01/06/2024   Obesity due to excess calories 03/28/2023   Primary osteoarthritis of left knee 11/28/2022   Bilateral knee pain 11/23/2022   Hepatic steatosis 11/07/2022   Fatigue 10/24/2022   Pre-diabetes 10/24/2022   Hemorrhoids 06/07/2022   Abdominal pain, left upper quadrant 02/01/2022   Colon cancer screening 05/06/2020   Insomnia, psychophysiological 03/14/2020   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   Grief reaction 10/03/2018   Generalized anxiety disorder 01/23/2017   Vitamin D  deficiency 05/20/2015   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   Morbid obesity (HCC) with starting BMI 37 10/25/2011   Routine general medical examination at a health care facility 10/16/2011   Hyperlipidemia LDL goal <130 12/04/2007   GERD 12/04/2007   Essential hypertension 12/04/2007   Past Medical History:  Diagnosis Date   Allergy    pollen   Anxiety 10/2018   Bursitis, trochanteric    bi/lat    Dental crowns present    x 4   Diverticulosis    Fatty liver    GERD (gastroesophageal reflux disease) Unsure   Hyperlipidemia Unsure   no current med.   Hypertension 10/2018   white coat syndrome,   Mass of breast, left 01/2012   Obesity    Palpitations    PONV (postoperative nausea  and vomiting)    Pre-diabetes    Pre-diabetes    Vitamin D  deficiency    Past Surgical History:  Procedure Laterality Date   ABDOMINAL HYSTERECTOMY  02/1994   partial   ANTERIOR AND POSTERIOR REPAIR WITH SACROSPINOUS FIXATION N/A 08/20/2022   Procedure: POSTERIOR REPAIR WITH PERINEORRHAPHY AND WITH  SACROSPINOUS FIXATION;  Surgeon: Marilynne Rosaline SAILOR, MD;  Location: [facility];  Service: Gynecology;  Laterality:  N/A;   BREAST BIOPSY Right 08/04/2021   BREAST EXCISIONAL BIOPSY Left 2013   BREAST SURGERY  01/10/12   lumpectomy - left (excisional biopsy benign)   CHOLECYSTECTOMY N/A 02/10/2016   Procedure: LAPAROSCOPIC CHOLECYSTECTOMY WITH INTRAOPERATIVE CHOLANGIOGRAM;  Surgeon: Deward Null III, MD;  Location: [facility] OR;  Service: General;  Laterality: N/A;   COLONOSCOPY  04/06/2010   D Brodie, normal w/tics   JOINT REPLACEMENT     RECTOCELE REPAIR  06/14/2011   cystocele repair, pt states Dr Marykay did not do rectocele and said she had to come back later.   ROBOTIC ASSISTED LAPAROSCOPIC SACROCOLPOPEXY  06/14/2011   Procedure: ROBOTIC ASSISTED LAPAROSCOPIC SACROCOLPOPEXY;  Surgeon: Noretta Ferrara, MD;  Location: [facility];  Service: Urology;  Laterality: N/A;   TOTAL KNEE ARTHROPLASTY Left 01/06/2024   Procedure: LEFT TOTAL KNEE ARTHROPLASTY;  Surgeon: Jerri Kay HERO, MD;  Location: [facility];  Service: Orthopedics;  Laterality: Left;   TUBAL LIGATION  ?1988   Social History   Tobacco Use   Smoking status: Never    Passive exposure: Past   Smokeless tobacco: Never  Vaping Use   Vaping status: Never Used  Substance Use Topics   Alcohol use: Never   Drug use: Never   Family History  Problem Relation Age of Onset   Hypertension Mother    Hyperlipidemia Mother    Diabetes Mother    Breast cancer Mother 23   Uterine cancer Mother    Arrhythmia Mother    Heart failure Mother    Cancer Mother    GER disease Father    Depression Sister    Heart disease Brother    Heart  attack Maternal Grandmother    Hypertension Maternal Grandmother    Heart disease Paternal Grandmother    Hypertension Paternal Grandmother    Heart attack Paternal Uncle    Heart disease Paternal Uncle    Heart disease Paternal Uncle    Colon cancer Neg Hx    Colon polyps Neg Hx    Esophageal cancer Neg Hx    Rectal cancer Neg Hx    Stomach cancer Neg Hx    Allergies  Allergen Reactions   Buspar  [Buspirone ] Anxiety    Made her more anxious   Current Outpatient Medications on File Prior to Visit  Medication Sig Dispense Refill   acidophilus (RISAQUAD) CAPS capsule Take 1 capsule by mouth daily.     amLODipine  (NORVASC ) 2.5 MG tablet TAKE ONE TABLET BY MOUTH ONE TIME DAILY 90 tablet 0   amoxicillin  (AMOXIL ) 500 MG capsule Take 4 pills one hour prior to dental work 8 capsule 2   b complex vitamins capsule Take 1 capsule by mouth daily.     clobetasol  cream (TEMOVATE ) 0.05 % Apply 1 Application topically 2 (two) times daily. Bid to aa itchy rash left lower leg and right hand for 2 weeks then decrease to qd until resolved, avoid face, groin, axilla 45 g 0   docusate sodium  (COLACE) 100 MG capsule Take 1 capsule (100 mg total) by mouth daily as needed. 30 capsule 2   esomeprazole  (NEXIUM ) 20 MG capsule Take 20 mg by mouth 2 (two) times daily before a meal.     fluticasone  (FLONASE ) 50 MCG/ACT nasal spray Place 2 sprays into both nostrils daily. 48 g 3   hydrocortisone  (ANUSOL -HC) 25 MG suppository INSERT ONE SUPPOSITORY RECTALLY TWICE A DAY AS NEEDED FOR HEMORRHOIDS OR ANAL ITCHING 12 suppository 0   loratadine (CLARITIN) 10 MG tablet Take 10  mg by mouth daily as needed for allergies.     metFORMIN  (GLUCOPHAGE ) 500 MG tablet Take 1 tablet (500 mg total) by mouth 2 (two) times daily with a meal. 180 tablet 0   ondansetron  (ZOFRAN ) 4 MG tablet Take 1 tablet (4 mg total) by mouth every 8 (eight) hours as needed for nausea or vomiting. 40 tablet 0   sertraline  (ZOLOFT ) 25 MG tablet TAKE ONE  TABLET BY MOUTH ONE TIME DAILY 90 tablet 0   Vitamin D , Cholecalciferol, 25 MCG (1000 UT) TABS Take 5,000 Units by mouth daily.     No current facility-administered medications on file prior to visit.    Review of Systems  Constitutional:  Positive for fatigue. Negative for activity change, appetite change, fever and unexpected weight change.  HENT:  Negative for congestion, ear pain, rhinorrhea, sinus pressure and sore throat.   Eyes:  Negative for pain, redness and visual disturbance.  Respiratory:  Negative for cough, shortness of breath and wheezing.   Cardiovascular:  Negative for chest pain and palpitations.  Gastrointestinal:  Negative for abdominal pain, blood in stool, constipation and diarrhea.  Endocrine: Negative for polydipsia and polyuria.  Genitourinary:  Negative for dysuria, frequency and urgency.  Musculoskeletal:  Positive for arthralgias. Negative for back pain and myalgias.  Skin:  Negative for pallor and rash.  Allergic/Immunologic: Negative for environmental allergies.  Neurological:  Negative for dizziness, syncope and headaches.  Hematological:  Negative for adenopathy. Does not bruise/bleed easily.  Psychiatric/Behavioral:  Negative for decreased concentration and dysphoric mood. The patient is not nervous/anxious.        Objective:   Physical Exam Constitutional:      General: She is not in acute distress.    Appearance: Normal appearance. She is well-developed. She is obese. She is not ill-appearing or diaphoretic.  HENT:     Head: Normocephalic and atraumatic.     Right Ear: Tympanic membrane, ear canal and external ear normal.     Left Ear: Tympanic membrane, ear canal and external ear normal.     Nose: Nose normal. No congestion.     Mouth/Throat:     Mouth: Mucous membranes are moist.     Pharynx: Oropharynx is clear. No posterior oropharyngeal erythema.  Eyes:     General: No scleral icterus.    Extraocular Movements: Extraocular movements  intact.     Conjunctiva/sclera: Conjunctivae normal.     Pupils: Pupils are equal, round, and reactive to light.  Neck:     Thyroid : No thyromegaly.     Vascular: No carotid bruit or JVD.  Cardiovascular:     Rate and Rhythm: Normal rate and regular rhythm.     Pulses: Normal pulses.     Heart sounds: Normal heart sounds.     No gallop.  Pulmonary:     Effort: Pulmonary effort is normal. No respiratory distress.     Breath sounds: Normal breath sounds. No wheezing.     Comments: Good air exch Chest:     Chest wall: No tenderness.  Abdominal:     General: Bowel sounds are normal. There is no distension or abdominal bruit.     Palpations: Abdomen is soft. There is no mass.     Tenderness: There is no abdominal tenderness.     Hernia: No hernia is present.  Genitourinary:    Comments: Breast exam: No mass, nodules, thickening, tenderness, bulging, retraction, inflamation, nipple discharge or skin changes noted.  No axillary or clavicular LA.  Musculoskeletal:        General: No tenderness. Normal range of motion.     Cervical back: Normal range of motion and neck supple. No rigidity. No muscular tenderness.     Right lower leg: No edema.     Left lower leg: No edema.     Comments: No kyphosis   Lymphadenopathy:     Cervical: No cervical adenopathy.  Skin:    General: Skin is warm and dry.     Coloration: Skin is not pale.     Findings: No erythema or rash.     Comments: Solar lentigines diffusely   Neurological:     Mental Status: She is alert. Mental status is at baseline.     Cranial Nerves: No cranial nerve deficit.     Motor: No abnormal muscle tone.     Coordination: Coordination normal.     Gait: Gait normal.     Deep Tendon Reflexes: Reflexes are normal and symmetric. Reflexes normal.  Psychiatric:        Mood and Affect: Mood normal.        Cognition and Memory: Cognition and memory normal.           Assessment & Plan:   Problem List Items Addressed  This Visit       Cardiovascular and Mediastinum   Essential hypertension   bp in fair control at this time  BP Readings from Last 1 Encounters:  07/15/24 132/74   No changes needed Most recent labs reviewed  Disc lifstyle change with low sodium diet and exercise  Plan to continue amlodipine  2.5 mg daily         Digestive   Hepatic steatosis   Lab Results  Component Value Date   ALT 16 04/07/2024   AST 12 04/07/2024   ALKPHOS 95 04/07/2024   BILITOT 0.3 04/07/2024   Encouraged weight loss       GERD   Continues nexium  20 mg bid Well controlled Famotidine  was not effective Encouraged to avoid triggers         Hematopoietic and Hemostatic   Thrombocytopenia   Relevant Orders   CBC with Differential/Platelet     Other   Morbid obesity (HCC) with starting BMI 37 (Chronic)   Bmi 37.9   Discussed how this problem influences overall health and the risks it imposes  Reviewed plan for weight loss with lower calorie diet (via better food choices (lower glycemic and portion control) along with exercise building up to or more than 30 minutes 5 days per week including some aerobic activity and strength training   Getting back on track after knee replacement  Has been to healthy weight clinic in past       Vitamin D  deficiency   Last vitamin D  Lab Results  Component Value Date   VD25OH 63.42 04/07/2024   Vitamin D  level is therapeutic with current supplementation Disc importance of this to bone and overall health       Routine general medical examination at a health care facility - Primary   Reviewed health habits including diet and exercise and skin cancer prevention Reviewed appropriate screening tests for age  Also reviewed health mt list, fam hx and immunization status , as well as social and family history   See HPI Labs reviewed and ordered Health Maintenance  Topic Date Due   Zoster (Shingles) Vaccine (1 of 2) Never done   Pneumococcal Vaccine for age  over 75 (1 of 1 -  PCV) Never done   Breast Cancer Screening  07/14/2024   COVID-19 Vaccine (3 - Moderna risk series) 07/06/2025*   Hepatitis C Screening  11/25/2027*   HIV Screening  11/25/2027*   DTaP/Tdap/Td vaccine (11 - Td or Tdap) 04/07/2027   Colon Cancer Screening  07/04/2032   Flu Shot  Completed   HPV Vaccine  Aged Out   Meningitis B Vaccine  Aged Out  *Topic was postponed. The date shown is not the original due date.    Considering shingrix vaccine Mammogram planned tomorrow  Discussed fall prevention, supplements and exercise for bone density  PHQ 0 with treatment        Pre-diabetes   Prediabetes  Lab Results  Component Value Date   HGBA1C 6.3 04/07/2024   HGBA1C 6.1 06/21/2023   HGBA1C 5.9 (H) 02/28/2023    disc imp of low glycemic diet and wt loss to prevent DM2        Hyperlipidemia LDL goal <130   Disc goals for lipids and reasons to control them Rev last labs with pt Rev low sat fat diet in detail  Lab today  Pt wants to avoid medication if possible       Relevant Orders   Lipid panel   Generalized anxiety disorder   Continues sertraline  25 mg daily  Doing better overall Encouraged good self care       Current use of proton pump inhibitor   Lab Results  Component Value Date   VITAMINB12 455 04/07/2024   Last vitamin D  Lab Results  Component Value Date   VD25OH 63.42 04/07/2024   Continue to monitor       Colon cancer screening   Colonoscopy11/2023 utd

## 2024-07-15 NOTE — Assessment & Plan Note (Signed)
 bp in fair control at this time  BP Readings from Last 1 Encounters:  07/15/24 132/74   No changes needed Most recent labs reviewed  Disc lifstyle change with low sodium diet and exercise  Plan to continue amlodipine  2.5 mg daily

## 2024-07-15 NOTE — Assessment & Plan Note (Signed)
 Colonoscopy11/2023 utd

## 2024-07-15 NOTE — Patient Instructions (Addendum)
 If you are interested in the shingles vaccine series (Shingrix), call your insurance or pharmacy to check on coverage and location it must be given.  If affordable - you can schedule it here or at your pharmacy depending on coverage    Goal for exercise 5 days per week 30 or more minutes  Work up to it  Some cardio Also more importantly , strength training  Add some strength training to your routine, this is important for bone and brain health and can reduce your risk of falls and help your body use insulin  properly and regulate weight  Light weights, exercise bands , and internet videos are a good way to start  Yoga (chair or regular), machines , floor exercises or a gym with machines are also good options   Avoid added sugars in your diet when you can  Try to get most of your carbohydrates from produce (with the exception of white potatoes) and whole grains Eat less bread/pasta/rice/snack foods/cereals/sweets and other items from the middle of the grocery store (processed carbs)  Labs today

## 2024-07-16 ENCOUNTER — Inpatient Hospital Stay: Admission: RE | Admit: 2024-07-16 | Discharge: 2024-07-16

## 2024-07-16 DIAGNOSIS — Z1231 Encounter for screening mammogram for malignant neoplasm of breast: Secondary | ICD-10-CM

## 2024-07-17 ENCOUNTER — Telehealth: Payer: Self-pay | Admitting: *Deleted

## 2024-07-17 NOTE — Telephone Encounter (Signed)
 Pt viewed her labs via mychart but per Dr. Randeen:  I would like to re check labs fasting in 3 months   *Please schedule fasting lab appt in 3 months, thanks*

## 2024-07-20 NOTE — Telephone Encounter (Signed)
 Lvm pt call office

## 2024-07-22 ENCOUNTER — Ambulatory Visit: Payer: Self-pay | Admitting: Family Medicine

## 2024-08-17 ENCOUNTER — Encounter: Payer: Self-pay | Admitting: *Deleted

## 2024-08-18 ENCOUNTER — Ambulatory Visit: Admitting: Dermatology

## 2024-08-18 ENCOUNTER — Other Ambulatory Visit: Payer: Self-pay | Admitting: Family Medicine

## 2024-08-18 DIAGNOSIS — B078 Other viral warts: Secondary | ICD-10-CM

## 2024-08-18 NOTE — Progress Notes (Signed)
" ° °  Follow-Up Visit   Subjective  Makayla [name] is a 64 y.o. female who presents for the following: Wart at the right 5th MCP, biopsy proven. Treat with cryotherapy today.   The following portions of the chart were reviewed this encounter and updated as appropriate: medications, allergies, medical history  Review of Systems:  No other skin or systemic complaints except as noted in HPI or Assessment and Plan.  Objective  Well appearing patient in no apparent distress; mood and affect are within normal limits.  Areas Examined: hands  Relevant physical exam findings are noted in the Assessment and Plan.    Assessment & Plan     WART, BIOPSY PROVEN Exam: verrucous papules x 2 at the right 5th MCP.  Discussed viral / HPV (Human Papilloma Virus) etiology and risk of spread /infectivity to other areas of body as well as to other people.  Multiple treatments and methods may be required to clear warts and it is possible treatment may not be successful.  Treatment risks include discoloration; scarring and there is still potential for wart recurrence.  Treatment Plan: Destruction Procedure Note Destruction method: cryotherapy   Informed consent: discussed and consent obtained   Lesion destroyed using liquid nitrogen: Yes   Outcome: patient tolerated procedure well with no complications   Post-procedure details: wound care instructions given   Locations: right 5th MCP # of Lesions Treated: 2  Prior to procedure, discussed risks of blister formation, small wound, skin dyspigmentation, or rare scar following cryotherapy. Recommend Vaseline ointment to treated areas while healing.   Return in about 1 month (around 09/18/2024) for wart f/u.  IAndrea Kerns, CMA, am acting as scribe for Rexene Rattler, MD .   Documentation: I have reviewed the above documentation for accuracy and completeness, and I agree with the above.  Rexene Rattler, MD  "

## 2024-08-18 NOTE — Patient Instructions (Addendum)
 Cryotherapy Aftercare  Wash gently with soap and water everyday.   Apply Vaseline and Band-Aid daily until healed.   Viral Warts & Molluscum Contagiosum  Viral warts and molluscum contagiosum are growths of the skin caused by viral infection of the skin. If you have been given the diagnosis of viral warts or molluscum contagiosum there are a few things that you must understand about your condition:  There is no guaranteed treatment method available for this condition. Multiple treatments may be required, The treatments may be time consuming and require multiple visits to the dermatology office. The treatment may be expensive. You will be charged each time you come into the office to have the spots treated. The treated areas may develop new lesions further complicating treatment. The treated areas may leave a scar. There is no guarantee that even after multiple treatments that the spots will be successfully treated. These are caused by a viral infection and can be spread to other areas of the skin and to other people by direct contact. Therefore, new spots may occur.  Due to recent changes in healthcare laws, you may see results of your pathology and/or laboratory studies on MyChart before the doctors have had a chance to review them. We understand that in some cases there may be results that are confusing or concerning to you. Please understand that not all results are received at the same time and often the doctors may need to interpret multiple results in order to provide you with the best plan of care or course of treatment. Therefore, we ask that you please give us  2 business days to thoroughly review all your results before contacting the office for clarification. Should we see a critical lab result, you will be contacted sooner.   If You Need Anything After Your Visit  If you have any questions or concerns for your doctor, please call our main line at [phone] and press option 4 to  reach your doctor's medical assistant. If no one answers, please leave a voicemail as directed and we will return your call as soon as possible. Messages left after 4 pm will be answered the following business day.   You may also send us  a message via MyChart. We typically respond to MyChart messages within 1-2 business days.  For prescription refills, please ask your pharmacy to contact our office. Our fax number is [phone].  If you have an urgent issue when the clinic is closed that cannot wait until the next business day, you can page your doctor at the number below.    Please note that while we do our best to be available for urgent issues outside of office hours, we are not available 24/7.   If you have an urgent issue and are unable to reach us , you may choose to seek medical care at your doctor's office, retail clinic, urgent care center, or emergency room.  If you have a medical emergency, please immediately call 911 or go to the emergency department.  Pager Numbers  - Dr. Hester: [phone]  - Dr. Jackquline: [phone]  - Dr. Claudene: [phone]   - Dr. Raymund: [phone]  In the event of inclement weather, please call our main line at [phone] for an update on the status of any delays or closures.  Dermatology Medication Tips: Please keep the boxes that topical medications come in in order to help keep track of the instructions about where and how to use these. Pharmacies typically print the medication instructions only on  the boxes and not directly on the medication tubes.   If your medication is too expensive, please contact our office at [phone] option 4 or send us  a message through MyChart.   We are unable to tell what your co-pay for medications will be in advance as this is different depending on your insurance coverage. However, we may be able to find a substitute medication at lower cost or fill out paperwork to get insurance to cover a needed  medication.   If a prior authorization is required to get your medication covered by your insurance company, please allow us  1-2 business days to complete this process.  Drug prices often vary depending on where the prescription is filled and some pharmacies may offer cheaper prices.  The website www.goodrx.com contains coupons for medications through different pharmacies. The prices here do not account for what the cost may be with help from insurance (it may be cheaper with your insurance), but the website can give you the price if you did not use any insurance.  - You can print the associated coupon and take it with your prescription to the pharmacy.  - You may also stop by our office during regular business hours and pick up a GoodRx coupon card.  - If you need your prescription sent electronically to a different pharmacy, notify our office through [facility] or by phone at [phone] option 4.     Si Usted Necesita Algo Despus de Su Visita  Tambin puede enviarnos un mensaje a travs de Clinical Cytogeneticist. Por lo general respondemos a los mensajes de MyChart en el transcurso de 1 a 2 das hbiles.  Para renovar recetas, por favor pida a su farmacia que se ponga en contacto con nuestra oficina. Randi lakes de fax es [place] [phone].  Si tiene un asunto urgente cuando la clnica est cerrada y que no puede esperar hasta el siguiente da hbil, puede llamar/localizar a su doctor(a) al nmero que aparece a continuacin.   Por favor, tenga en cuenta que aunque hacemos todo lo posible para estar disponibles para asuntos urgentes fuera del horario de [place], no estamos disponibles las 24 horas del da, los 7 [address]  [address] de la [place].   Si tiene un problema urgente y no puede comunicarse con nosotros, puede optar por buscar atencin mdica  en el consultorio de su doctor(a), en una clnica privada, en un centro de atencin urgente o en una sala de emergencias.  Si tiene engineer, drilling,  por favor llame inmediatamente al 911 o vaya a la sala de emergencias.  Nmeros de bper  - Dr. Hester: [phone]  - Dra. Jackquline: [phone]  - Dr. Claudene: [phone]  - Dra. Kitts: [phone]  En caso de inclemencias del [place], por favor llame a nuestra lnea principal al [phone] para una actualizacin sobre el estado de cualquier retraso o cierre.  Consejos para la medicacin en dermatologa: Por favor, guarde las cajas en las que vienen los medicamentos de uso tpico para ayudarle a seguir las instrucciones sobre dnde y cmo usarlos. Las farmacias generalmente imprimen las instrucciones del medicamento slo en las cajas y no directamente en los tubos del [place].   Si su medicamento es muy caro, por favor, pngase en contacto con landry rieger llamando al [phone] y presione la opcin 4 o envenos un mensaje a travs de Clinical Cytogeneticist.   No podemos decirle cul ser su copago por los medicamentos por adelantado ya que esto es diferente dependiendo de la cobertura de su seguro.  Sin embargo, es posible que podamos encontrar un medicamento sustituto a audiological scientist un formulario para que el seguro cubra el medicamento que se considera necesario.   Si se requiere una autorizacin previa para que su compaa de seguros cubra su medicamento, por favor permtanos de 1 a 2 das hbiles para completar este proceso.  Los precios de los medicamentos varan con frecuencia dependiendo del environmental consultant de dnde se surte la receta y alguna farmacias pueden ofrecer precios ms baratos.  El sitio web www.goodrx.com tiene cupones para medicamentos de health and safety inspector. Los precios aqu no tienen en cuenta lo que podra costar con la ayuda del seguro (puede ser ms barato con su seguro), pero el sitio web puede darle el precio si no utiliz tourist information centre manager.  - Puede imprimir el cupn correspondiente y llevarlo con su receta a la farmacia.  - Tambin puede pasar por nuestra oficina  durante el horario de atencin regular y education officer, museum una tarjeta de cupones de GoodRx.  - Si necesita que su receta se enve electrnicamente a una farmacia diferente, informe a nuestra oficina a travs de MyChart de [place] o por telfono llamando al [phone] y presione la opcin 4.

## 2024-09-21 ENCOUNTER — Ambulatory Visit: Admitting: Dermatology

## 2024-09-21 ENCOUNTER — Ambulatory Visit (INDEPENDENT_AMBULATORY_CARE_PROVIDER_SITE_OTHER): Admitting: Physician Assistant

## 2024-10-19 ENCOUNTER — Other Ambulatory Visit

## 2024-11-16 ENCOUNTER — Encounter: Admitting: Dermatology

## 2025-01-05 ENCOUNTER — Ambulatory Visit: Admitting: Physician Assistant
